# Patient Record
Sex: Female | Born: 1950 | Race: White | Hispanic: No | Marital: Married | Smoking: Former smoker
Health system: Southern US, Community
[De-identification: ages and names within clinical notes are randomized; demographics above are authoritative.]

## PROBLEM LIST (undated history)

## (undated) DIAGNOSIS — D494 Neoplasm of unspecified behavior of bladder: Secondary | ICD-10-CM

## (undated) DIAGNOSIS — Z8673 Personal history of transient ischemic attack (TIA), and cerebral infarction without residual deficits: Secondary | ICD-10-CM

## (undated) DIAGNOSIS — K760 Fatty (change of) liver, not elsewhere classified: Secondary | ICD-10-CM

## (undated) DIAGNOSIS — M1990 Unspecified osteoarthritis, unspecified site: Secondary | ICD-10-CM

## (undated) DIAGNOSIS — N3010 Interstitial cystitis (chronic) without hematuria: Secondary | ICD-10-CM

## (undated) DIAGNOSIS — Z973 Presence of spectacles and contact lenses: Secondary | ICD-10-CM

## (undated) DIAGNOSIS — Z9889 Other specified postprocedural states: Secondary | ICD-10-CM

## (undated) DIAGNOSIS — K219 Gastro-esophageal reflux disease without esophagitis: Secondary | ICD-10-CM

## (undated) DIAGNOSIS — F419 Anxiety disorder, unspecified: Secondary | ICD-10-CM

## (undated) DIAGNOSIS — R079 Chest pain, unspecified: Secondary | ICD-10-CM

## (undated) DIAGNOSIS — K224 Dyskinesia of esophagus: Secondary | ICD-10-CM

## (undated) DIAGNOSIS — R011 Cardiac murmur, unspecified: Secondary | ICD-10-CM

## (undated) DIAGNOSIS — G4700 Insomnia, unspecified: Secondary | ICD-10-CM

## (undated) DIAGNOSIS — I5181 Takotsubo syndrome: Secondary | ICD-10-CM

## (undated) DIAGNOSIS — R112 Nausea with vomiting, unspecified: Secondary | ICD-10-CM

## (undated) DIAGNOSIS — E785 Hyperlipidemia, unspecified: Secondary | ICD-10-CM

## (undated) DIAGNOSIS — G8929 Other chronic pain: Secondary | ICD-10-CM

## (undated) DIAGNOSIS — H269 Unspecified cataract: Secondary | ICD-10-CM

## (undated) DIAGNOSIS — C801 Malignant (primary) neoplasm, unspecified: Secondary | ICD-10-CM

## (undated) DIAGNOSIS — R3989 Other symptoms and signs involving the genitourinary system: Secondary | ICD-10-CM

## (undated) DIAGNOSIS — I2510 Atherosclerotic heart disease of native coronary artery without angina pectoris: Secondary | ICD-10-CM

## (undated) DIAGNOSIS — Z8719 Personal history of other diseases of the digestive system: Secondary | ICD-10-CM

## (undated) DIAGNOSIS — I10 Essential (primary) hypertension: Secondary | ICD-10-CM

## (undated) DIAGNOSIS — K589 Irritable bowel syndrome without diarrhea: Secondary | ICD-10-CM

## (undated) DIAGNOSIS — M26609 Unspecified temporomandibular joint disorder, unspecified side: Secondary | ICD-10-CM

## (undated) DIAGNOSIS — R413 Other amnesia: Secondary | ICD-10-CM

## (undated) HISTORY — PX: CARDIAC CATHETERIZATION: SHX172

## (undated) HISTORY — DX: Anxiety disorder, unspecified: F41.9

## (undated) HISTORY — PX: ABDOMINAL HYSTERECTOMY: SHX81

## (undated) HISTORY — PX: CARPAL TUNNEL RELEASE: SHX101

## (undated) HISTORY — DX: Fatty (change of) liver, not elsewhere classified: K76.0

## (undated) HISTORY — DX: Dyskinesia of esophagus: K22.4

## (undated) HISTORY — DX: Unspecified osteoarthritis, unspecified site: M19.90

## (undated) HISTORY — DX: Takotsubo syndrome: I51.81

## (undated) HISTORY — DX: Interstitial cystitis (chronic) without hematuria: N30.10

## (undated) HISTORY — DX: Atherosclerotic heart disease of native coronary artery without angina pectoris: I25.10

## (undated) HISTORY — DX: Other amnesia: R41.3

## (undated) HISTORY — DX: Unspecified cataract: H26.9

## (undated) HISTORY — DX: Essential (primary) hypertension: I10

## (undated) HISTORY — DX: Hyperlipidemia, unspecified: E78.5

---
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
## 1971-06-11 HISTORY — PX: LUMBAR DISC SURGERY: SHX700

## 1990-06-10 HISTORY — PX: BREAST EXCISIONAL BIOPSY: SUR124

## 1994-06-10 HISTORY — PX: OTHER SURGICAL HISTORY: SHX169

## 1997-09-05 ENCOUNTER — Ambulatory Visit (HOSPITAL_COMMUNITY): Admission: RE | Admit: 1997-09-05 | Discharge: 1997-09-05 | Payer: Self-pay | Admitting: Internal Medicine

## 1997-09-12 ENCOUNTER — Ambulatory Visit (HOSPITAL_COMMUNITY): Admission: RE | Admit: 1997-09-12 | Discharge: 1997-09-12 | Payer: Self-pay | Admitting: Internal Medicine

## 1997-09-29 ENCOUNTER — Other Ambulatory Visit: Admission: RE | Admit: 1997-09-29 | Discharge: 1997-09-29 | Payer: Self-pay | Admitting: Obstetrics and Gynecology

## 1998-09-07 ENCOUNTER — Ambulatory Visit (HOSPITAL_COMMUNITY): Admission: RE | Admit: 1998-09-07 | Discharge: 1998-09-07 | Payer: Self-pay | Admitting: Internal Medicine

## 1998-10-03 ENCOUNTER — Other Ambulatory Visit: Admission: RE | Admit: 1998-10-03 | Discharge: 1998-10-03 | Payer: Self-pay | Admitting: Obstetrics and Gynecology

## 1998-12-15 ENCOUNTER — Encounter: Admission: RE | Admit: 1998-12-15 | Discharge: 1999-01-16 | Payer: Self-pay | Admitting: Internal Medicine

## 1999-09-12 ENCOUNTER — Encounter: Payer: Self-pay | Admitting: Internal Medicine

## 1999-09-12 ENCOUNTER — Ambulatory Visit (HOSPITAL_COMMUNITY): Admission: RE | Admit: 1999-09-12 | Discharge: 1999-09-12 | Payer: Self-pay | Admitting: Internal Medicine

## 1999-10-22 ENCOUNTER — Other Ambulatory Visit: Admission: RE | Admit: 1999-10-22 | Discharge: 1999-10-22 | Payer: Self-pay | Admitting: Obstetrics and Gynecology

## 1999-11-15 ENCOUNTER — Encounter: Admission: RE | Admit: 1999-11-15 | Discharge: 2000-01-04 | Payer: Self-pay | Admitting: Internal Medicine

## 2000-03-21 ENCOUNTER — Encounter: Payer: Self-pay | Admitting: Internal Medicine

## 2000-03-21 ENCOUNTER — Ambulatory Visit (HOSPITAL_COMMUNITY): Admission: RE | Admit: 2000-03-21 | Discharge: 2000-03-21 | Payer: Self-pay | Admitting: Internal Medicine

## 2000-04-08 ENCOUNTER — Encounter: Payer: Self-pay | Admitting: Internal Medicine

## 2000-04-08 ENCOUNTER — Encounter: Admission: RE | Admit: 2000-04-08 | Discharge: 2000-04-08 | Payer: Self-pay | Admitting: Internal Medicine

## 2000-07-01 ENCOUNTER — Encounter: Admission: RE | Admit: 2000-07-01 | Discharge: 2000-07-01 | Payer: Self-pay | Admitting: Internal Medicine

## 2000-07-01 ENCOUNTER — Encounter: Payer: Self-pay | Admitting: Internal Medicine

## 2000-07-11 ENCOUNTER — Encounter: Payer: Self-pay | Admitting: Obstetrics and Gynecology

## 2000-07-11 ENCOUNTER — Encounter: Admission: RE | Admit: 2000-07-11 | Discharge: 2000-07-11 | Payer: Self-pay | Admitting: Obstetrics and Gynecology

## 2000-10-16 ENCOUNTER — Encounter: Payer: Self-pay | Admitting: Internal Medicine

## 2000-10-16 ENCOUNTER — Ambulatory Visit (HOSPITAL_COMMUNITY): Admission: RE | Admit: 2000-10-16 | Discharge: 2000-10-16 | Payer: Self-pay | Admitting: Internal Medicine

## 2001-04-15 ENCOUNTER — Encounter: Payer: Self-pay | Admitting: Physical Medicine and Rehabilitation

## 2001-04-15 ENCOUNTER — Encounter
Admission: RE | Admit: 2001-04-15 | Discharge: 2001-04-15 | Payer: Self-pay | Admitting: Physical Medicine and Rehabilitation

## 2001-10-19 ENCOUNTER — Encounter: Payer: Self-pay | Admitting: Obstetrics and Gynecology

## 2001-10-19 ENCOUNTER — Ambulatory Visit (HOSPITAL_COMMUNITY): Admission: RE | Admit: 2001-10-19 | Discharge: 2001-10-19 | Payer: Self-pay | Admitting: Obstetrics and Gynecology

## 2001-11-06 ENCOUNTER — Encounter: Payer: Self-pay | Admitting: Obstetrics and Gynecology

## 2001-11-06 ENCOUNTER — Encounter: Admission: RE | Admit: 2001-11-06 | Discharge: 2001-11-06 | Payer: Self-pay | Admitting: Obstetrics and Gynecology

## 2002-07-07 ENCOUNTER — Encounter: Payer: Self-pay | Admitting: Internal Medicine

## 2002-07-07 ENCOUNTER — Encounter: Admission: RE | Admit: 2002-07-07 | Discharge: 2002-07-07 | Payer: Self-pay | Admitting: Internal Medicine

## 2002-10-21 ENCOUNTER — Encounter: Payer: Self-pay | Admitting: Internal Medicine

## 2002-10-21 ENCOUNTER — Ambulatory Visit (HOSPITAL_COMMUNITY): Admission: RE | Admit: 2002-10-21 | Discharge: 2002-10-21 | Payer: Self-pay | Admitting: Internal Medicine

## 2002-11-02 ENCOUNTER — Other Ambulatory Visit: Admission: RE | Admit: 2002-11-02 | Discharge: 2002-11-02 | Payer: Self-pay | Admitting: Obstetrics and Gynecology

## 2003-01-07 ENCOUNTER — Encounter: Admission: RE | Admit: 2003-01-07 | Discharge: 2003-01-07 | Payer: Self-pay | Admitting: Internal Medicine

## 2003-01-07 ENCOUNTER — Encounter: Payer: Self-pay | Admitting: Internal Medicine

## 2003-01-17 ENCOUNTER — Encounter: Payer: Self-pay | Admitting: Internal Medicine

## 2003-01-17 ENCOUNTER — Encounter: Admission: RE | Admit: 2003-01-17 | Discharge: 2003-01-17 | Payer: Self-pay | Admitting: Internal Medicine

## 2003-02-09 ENCOUNTER — Encounter: Payer: Self-pay | Admitting: Internal Medicine

## 2003-03-31 ENCOUNTER — Other Ambulatory Visit: Admission: RE | Admit: 2003-03-31 | Discharge: 2003-03-31 | Payer: Self-pay | Admitting: Obstetrics and Gynecology

## 2003-04-04 ENCOUNTER — Encounter (INDEPENDENT_AMBULATORY_CARE_PROVIDER_SITE_OTHER): Payer: Self-pay | Admitting: *Deleted

## 2003-04-04 ENCOUNTER — Other Ambulatory Visit: Admission: RE | Admit: 2003-04-04 | Discharge: 2003-04-04 | Payer: Self-pay | Admitting: Radiology

## 2003-04-04 ENCOUNTER — Encounter: Admission: RE | Admit: 2003-04-04 | Discharge: 2003-04-04 | Payer: Self-pay | Admitting: Obstetrics and Gynecology

## 2003-04-04 ENCOUNTER — Encounter: Payer: Self-pay | Admitting: Obstetrics and Gynecology

## 2003-08-26 ENCOUNTER — Encounter: Admission: RE | Admit: 2003-08-26 | Discharge: 2003-08-26 | Payer: Self-pay | Admitting: Internal Medicine

## 2003-11-09 ENCOUNTER — Other Ambulatory Visit: Admission: RE | Admit: 2003-11-09 | Discharge: 2003-11-09 | Payer: Self-pay | Admitting: Obstetrics and Gynecology

## 2003-12-06 ENCOUNTER — Encounter: Admission: RE | Admit: 2003-12-06 | Discharge: 2003-12-06 | Payer: Self-pay | Admitting: Obstetrics and Gynecology

## 2004-01-11 ENCOUNTER — Encounter: Admission: RE | Admit: 2004-01-11 | Discharge: 2004-01-11 | Payer: Self-pay | Admitting: Internal Medicine

## 2004-01-17 ENCOUNTER — Encounter: Admission: RE | Admit: 2004-01-17 | Discharge: 2004-01-17 | Payer: Self-pay | Admitting: Internal Medicine

## 2004-05-12 ENCOUNTER — Emergency Department (HOSPITAL_COMMUNITY): Admission: EM | Admit: 2004-05-12 | Discharge: 2004-05-12 | Payer: Self-pay | Admitting: Emergency Medicine

## 2004-05-21 ENCOUNTER — Ambulatory Visit (HOSPITAL_COMMUNITY): Admission: RE | Admit: 2004-05-21 | Discharge: 2004-05-21 | Payer: Self-pay | Admitting: Internal Medicine

## 2004-11-09 ENCOUNTER — Other Ambulatory Visit: Admission: RE | Admit: 2004-11-09 | Discharge: 2004-11-27 | Payer: Self-pay | Admitting: Addiction Medicine

## 2004-12-06 ENCOUNTER — Ambulatory Visit (HOSPITAL_COMMUNITY): Admission: RE | Admit: 2004-12-06 | Discharge: 2004-12-06 | Payer: Self-pay | Admitting: Internal Medicine

## 2004-12-21 ENCOUNTER — Ambulatory Visit: Payer: Self-pay | Admitting: Internal Medicine

## 2005-04-17 ENCOUNTER — Encounter: Admission: RE | Admit: 2005-04-17 | Discharge: 2005-04-17 | Payer: Self-pay | Admitting: Internal Medicine

## 2005-04-29 ENCOUNTER — Ambulatory Visit: Payer: Self-pay | Admitting: Internal Medicine

## 2005-05-13 ENCOUNTER — Ambulatory Visit: Payer: Self-pay | Admitting: Internal Medicine

## 2005-07-11 ENCOUNTER — Ambulatory Visit: Payer: Self-pay | Admitting: Internal Medicine

## 2005-07-16 ENCOUNTER — Ambulatory Visit: Payer: Self-pay | Admitting: Cardiology

## 2005-10-15 ENCOUNTER — Emergency Department (HOSPITAL_COMMUNITY): Admission: EM | Admit: 2005-10-15 | Discharge: 2005-10-16 | Payer: Self-pay | Admitting: Emergency Medicine

## 2005-11-26 ENCOUNTER — Other Ambulatory Visit: Admission: RE | Admit: 2005-11-26 | Discharge: 2005-11-26 | Payer: Self-pay | Admitting: Obstetrics and Gynecology

## 2005-12-10 ENCOUNTER — Ambulatory Visit (HOSPITAL_COMMUNITY): Admission: RE | Admit: 2005-12-10 | Discharge: 2005-12-10 | Payer: Self-pay | Admitting: Internal Medicine

## 2006-05-07 ENCOUNTER — Encounter: Admission: RE | Admit: 2006-05-07 | Discharge: 2006-05-07 | Payer: Self-pay | Admitting: Internal Medicine

## 2006-10-24 ENCOUNTER — Encounter: Admission: RE | Admit: 2006-10-24 | Discharge: 2006-10-24 | Payer: Self-pay | Admitting: Internal Medicine

## 2006-10-31 ENCOUNTER — Encounter: Admission: RE | Admit: 2006-10-31 | Discharge: 2006-10-31 | Payer: Self-pay | Admitting: Internal Medicine

## 2006-11-20 ENCOUNTER — Ambulatory Visit (HOSPITAL_COMMUNITY): Admission: RE | Admit: 2006-11-20 | Discharge: 2006-11-20 | Payer: Self-pay | Admitting: Interventional Cardiology

## 2006-11-28 ENCOUNTER — Inpatient Hospital Stay (HOSPITAL_BASED_OUTPATIENT_CLINIC_OR_DEPARTMENT_OTHER): Admission: RE | Admit: 2006-11-28 | Discharge: 2006-11-28 | Payer: Self-pay | Admitting: Interventional Cardiology

## 2006-12-19 ENCOUNTER — Ambulatory Visit (HOSPITAL_COMMUNITY): Admission: RE | Admit: 2006-12-19 | Discharge: 2006-12-19 | Payer: Self-pay | Admitting: Internal Medicine

## 2007-01-22 ENCOUNTER — Other Ambulatory Visit: Admission: RE | Admit: 2007-01-22 | Discharge: 2007-01-22 | Payer: Self-pay | Admitting: Internal Medicine

## 2007-03-12 ENCOUNTER — Ambulatory Visit: Payer: Self-pay | Admitting: Internal Medicine

## 2007-03-23 ENCOUNTER — Encounter: Payer: Self-pay | Admitting: Internal Medicine

## 2007-03-23 ENCOUNTER — Ambulatory Visit (HOSPITAL_COMMUNITY): Admission: RE | Admit: 2007-03-23 | Discharge: 2007-03-23 | Payer: Self-pay | Admitting: Internal Medicine

## 2007-03-31 ENCOUNTER — Ambulatory Visit: Payer: Self-pay | Admitting: Internal Medicine

## 2007-04-24 ENCOUNTER — Ambulatory Visit: Payer: Self-pay | Admitting: Internal Medicine

## 2007-08-05 DIAGNOSIS — I25119 Atherosclerotic heart disease of native coronary artery with unspecified angina pectoris: Secondary | ICD-10-CM | POA: Insufficient documentation

## 2007-08-05 DIAGNOSIS — R1013 Epigastric pain: Secondary | ICD-10-CM

## 2007-08-05 DIAGNOSIS — E785 Hyperlipidemia, unspecified: Secondary | ICD-10-CM | POA: Insufficient documentation

## 2007-08-05 DIAGNOSIS — K3189 Other diseases of stomach and duodenum: Secondary | ICD-10-CM | POA: Insufficient documentation

## 2007-08-05 DIAGNOSIS — K219 Gastro-esophageal reflux disease without esophagitis: Secondary | ICD-10-CM | POA: Insufficient documentation

## 2007-08-05 DIAGNOSIS — K594 Anal spasm: Secondary | ICD-10-CM | POA: Insufficient documentation

## 2007-08-05 DIAGNOSIS — K589 Irritable bowel syndrome without diarrhea: Secondary | ICD-10-CM | POA: Insufficient documentation

## 2007-08-05 DIAGNOSIS — I10 Essential (primary) hypertension: Secondary | ICD-10-CM | POA: Insufficient documentation

## 2007-10-08 ENCOUNTER — Encounter (HOSPITAL_COMMUNITY): Admission: RE | Admit: 2007-10-08 | Discharge: 2007-12-09 | Payer: Self-pay | Admitting: Interventional Cardiology

## 2007-11-01 ENCOUNTER — Ambulatory Visit: Payer: Self-pay | Admitting: Internal Medicine

## 2007-11-02 ENCOUNTER — Observation Stay (HOSPITAL_COMMUNITY): Admission: EM | Admit: 2007-11-02 | Discharge: 2007-11-02 | Payer: Self-pay | Admitting: Emergency Medicine

## 2007-12-24 ENCOUNTER — Ambulatory Visit (HOSPITAL_COMMUNITY): Admission: RE | Admit: 2007-12-24 | Discharge: 2007-12-24 | Payer: Self-pay | Admitting: Internal Medicine

## 2008-03-28 ENCOUNTER — Telehealth: Payer: Self-pay | Admitting: Internal Medicine

## 2008-06-10 HISTORY — PX: SHOULDER SURGERY: SHX246

## 2008-06-10 HISTORY — PX: OTHER SURGICAL HISTORY: SHX169

## 2008-06-13 ENCOUNTER — Telehealth: Payer: Self-pay | Admitting: Internal Medicine

## 2008-06-28 ENCOUNTER — Encounter: Payer: Self-pay | Admitting: Internal Medicine

## 2008-06-29 ENCOUNTER — Telehealth: Payer: Self-pay | Admitting: Internal Medicine

## 2008-08-15 ENCOUNTER — Telehealth: Payer: Self-pay | Admitting: Internal Medicine

## 2008-09-16 ENCOUNTER — Telehealth: Payer: Self-pay | Admitting: Internal Medicine

## 2008-09-19 ENCOUNTER — Ambulatory Visit: Payer: Self-pay | Admitting: Internal Medicine

## 2008-11-03 ENCOUNTER — Telehealth: Payer: Self-pay | Admitting: Internal Medicine

## 2008-11-03 ENCOUNTER — Encounter: Payer: Self-pay | Admitting: Internal Medicine

## 2008-12-21 ENCOUNTER — Telehealth: Payer: Self-pay | Admitting: Internal Medicine

## 2008-12-30 ENCOUNTER — Ambulatory Visit (HOSPITAL_COMMUNITY): Admission: RE | Admit: 2008-12-30 | Discharge: 2008-12-30 | Payer: Self-pay | Admitting: Internal Medicine

## 2009-01-02 ENCOUNTER — Telehealth: Payer: Self-pay | Admitting: Internal Medicine

## 2009-01-13 ENCOUNTER — Telehealth: Payer: Self-pay | Admitting: Internal Medicine

## 2009-07-06 ENCOUNTER — Other Ambulatory Visit: Admission: RE | Admit: 2009-07-06 | Discharge: 2009-07-06 | Payer: Self-pay | Admitting: Internal Medicine

## 2009-08-11 ENCOUNTER — Encounter: Admission: RE | Admit: 2009-08-11 | Discharge: 2009-08-11 | Payer: Self-pay | Admitting: Specialist

## 2009-09-29 ENCOUNTER — Observation Stay (HOSPITAL_COMMUNITY): Admission: EM | Admit: 2009-09-29 | Discharge: 2009-09-30 | Payer: Self-pay | Admitting: Emergency Medicine

## 2009-09-30 ENCOUNTER — Encounter (INDEPENDENT_AMBULATORY_CARE_PROVIDER_SITE_OTHER): Payer: Self-pay | Admitting: Pediatrics

## 2010-01-17 ENCOUNTER — Ambulatory Visit: Payer: Self-pay | Admitting: Internal Medicine

## 2010-01-17 DIAGNOSIS — R350 Frequency of micturition: Secondary | ICD-10-CM | POA: Insufficient documentation

## 2010-01-17 DIAGNOSIS — I6350 Cerebral infarction due to unspecified occlusion or stenosis of unspecified cerebral artery: Secondary | ICD-10-CM | POA: Insufficient documentation

## 2010-01-19 ENCOUNTER — Ambulatory Visit (HOSPITAL_COMMUNITY): Admission: RE | Admit: 2010-01-19 | Discharge: 2010-01-19 | Payer: Self-pay | Admitting: Internal Medicine

## 2010-02-13 ENCOUNTER — Encounter: Payer: Self-pay | Admitting: Internal Medicine

## 2010-03-12 ENCOUNTER — Ambulatory Visit (HOSPITAL_BASED_OUTPATIENT_CLINIC_OR_DEPARTMENT_OTHER): Admission: RE | Admit: 2010-03-12 | Discharge: 2010-03-12 | Payer: Self-pay | Admitting: Urology

## 2010-03-12 HISTORY — PX: OTHER SURGICAL HISTORY: SHX169

## 2010-03-16 ENCOUNTER — Encounter: Payer: Self-pay | Admitting: Internal Medicine

## 2010-07-10 NOTE — Letter (Signed)
[facility]: [facility]   Imported By: Sherian Rein 04/02/2010 15:39:55  _____________________________________________________________________  External Attachment:    Type:   Image     Comment:   External Document

## 2010-07-10 NOTE — Procedures (Signed)
Summary: Gastroenterology EGD  Gastroenterology EGD   Imported By: Lowry Ram CMA 08/05/2007 17:03:32  _____________________________________________________________________  External Attachment:    Type:   Image     Comment:   External Document

## 2010-07-10 NOTE — Medication Information (Signed)
Summary: Approved for Pantoprazole/[facility]  Approved for Pantoprazole/[facility]   Imported By: Sherian Rein 11/10/2008 08:16:24  _____________________________________________________________________  External Attachment:    Type:   Image     Comment:   External Document

## 2010-07-10 NOTE — Progress Notes (Signed)
Summary: no prior autho  Phone Note Call from Patient   Caller: Patient Call For: Ramon Brant  Reason for Call: Talk to Nurse Summary of Call: FYI.Marland KitchenMarland Kitchenper [facility] needs prior autho on Dexilant (was just advised) ..but pt wanted to let the office know that she does NOT want this med so Do NOT send the prior autho  Initial call taken by: Guadlupe [employer],  January 13, 2009 3:15 PM  Follow-up for Phone Call        noted Follow-up by: Harlow Mares CMA Duncan Dull),  January 13, 2009 3:47 PM

## 2010-07-10 NOTE — Assessment & Plan Note (Signed)
Summary: RECTAL PAIN...AS.   History of Present Illness Visit Type: Follow-up Visit Primary GI MD: Lina Sar MD Primary Provider: Renford Dills, MD Chief Complaint: rectal discomfort off and on x 1 month with back pain. Pt denies change in bowel habits.  History of Present Illness:   Ms. [name] is a 60 year old white female with a history of irritable bowel syndrome, proctalgia,  rectal spasm, and now with suspected esophageal spasm and gastroesophageal reflux disease. She had a cardiac evaluation for chest pain in June 2008, which showed significant coronary artery disease, but her current chest pain does not seem to be interpreted as cardiac. Her last upper endoscopy in September 2004 showed a normal exam. She had an esophageal manometry which was entirely normal and she had a 24 hour pH study, which apparently was normal as well. The patient's chest pain occurred while taking Nexium. Librax seemed to have helped with the pain. Her HIDA scan with CCK showed a normal ejection fraction of 55%. She has been on isosorbide 30 mg daily for esophageal spasm with some improvement of her symptoms.  She is seen today because of increasing rectal pain. Her pain is different than her usual rectal spasm. This is more of a constant ache which occurs during the day but not at night. There has been no rectal bleeding. Her last colonoscopy was in 2006.   GI Review of Systems      Denies abdominal pain, acid reflux, belching, bloating, chest pain, dysphagia with liquids, dysphagia with solids, heartburn, loss of appetite, nausea, vomiting, vomiting blood, weight loss, and  weight gain.      Reports rectal pain.     Denies anal fissure, black tarry stools, change in bowel habit, constipation, diarrhea, diverticulosis, fecal incontinence, heme positive stool, hemorrhoids, irritable bowel syndrome, jaundice, light color stool, liver problems, and  rectal bleeding. Preventive Screening-Counseling & Management     Smoking Status: never      Drug Use:  no.      Current Medications (verified): 1)  Protonix 40 Mg Tbec (Pantoprazole Sodium) .... Take 1 Tablet By Mouth Two Times A Day. Must Have Office Visits For Any Further Refills! 2)  Isosorbide Dinitrate 30 Mg Tabs (Isosorbide Dinitrate) .... One Tablet By Mouth Once Daily 3)  Acyclovir 400 Mg Tabs (Acyclovir) .... One Tablet By Mouth Two Times A Day 4)  Climara 0.075 Mg/24hr Ptwk (Estradiol) .... Once A Week 5)  Aspirin 81 Mg  Tabs (Aspirin) .... One Tablet By Mouth Once Daily 6)  Vitamin D 2000 Unit Tabs (Cholecalciferol) .... One Tablet By Mouth Once Daily 7)  Multivitamins   Tabs (Multiple Vitamin) .... One Tablet By Mouth Once Daily 8)  Dicyclomine Hcl 10 Mg Caps (Dicyclomine Hcl) .... One Tablet By Mouth Three Times A Day 9)  Librax 2.5-5 Mg Caps (Clidinium-Chlordiazepoxide) .... One Tablet By Mouth As Needed 10)  Nitroglycerin Cr 2.5 Mg Cr-Caps (Nitroglycerin) .... As Needed 11)  Diltiazem 2% Gel .... As Needed  Allergies (verified): 1)  ! * Levsinex 2)  ! 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     Alcohol Use - yes    Illicit Drug Use - no    Smoking Status:  never    Drug Use:  no  Review of Systems  The patient denies allergy/sinus, anemia, anxiety-new, arthritis/joint pain, back pain, blood in urine,  breast changes/lumps, change in vision, confusion, cough, coughing up blood, depression-new, fainting, fatigue, fever, headaches-new, hearing problems, heart murmur, heart rhythm changes, itching, menstrual pain, muscle pains/cramps, night sweats, nosebleeds, pregnancy symptoms, shortness of breath, skin rash, sleeping problems, sore throat, swelling of feet/legs, swollen lymph glands, thirst - excessive , urination - excessive , urination changes/pain, urine leakage, vision changes, and voice change.    Physical Exam  General:  Well developed, well nourished, no acute distress. Neck:  Supple; no masses or thyromegaly. Lungs:  Clear throughout to auscultation. Heart:  Regular rate and rhythm; no murmurs, rubs or bruits. Abdomen:  soft abdomen with normal active bowel sounds, minimal tenderness in left lower quadrant, no palpable mass and no distention. Rectal:  Anoscopic exam shows a normal perianal area. Normal rectal tone. First grade hemorrhoids are present but not active. There is no pain within the anal canal but there is some tenderness of the rectal ampulla. Mucosa of the rectal ampulla appears normal. There is also some discomfort or pressure at the coccygeal area. Extremities:  No clubbing, cyanosis, edema or deformities noted.   Impression & Recommendations:  Problem # 1:  Hx of PROCTALGIA FUGAX (ICD-564.6) history of rectal spasm. The patient is now having a different rectal discomfort. She has small first grade hemorrhoids. He will go ahead and treat them with Anusol-HC suppositories. At the same time, I have asked her to continue on the Librax one capsule twice a day.  Problem # 2:  IRRITABLE BOWEL SYNDROME (ICD-564.1) alternating diarrhea and constipation consistent with irritable bowel syndrome. We will start her on Klonopin 1 mg at bedtime.  Problem # 3:  FAMILY HISTORY COLONIC POLYPS (ICD-V18.51) last colonoscopy was in December 2006. It was a normal exam. Her next colonoscopy  will be due in December 2016.  Patient Instructions: 1)  Klonopin 1 mg daily 2)  Anusol-HC suppositories q.h.s. 3)  refill Diltiazem 2% gel as needed for rectal spasm 4)  Refill on pantoprazole 40 mg p.o. b.i.d. 5)  Librax one p.o. b.i.d. p.r.n. 6)  Recall colonoscopy December 2016 7)  Copy sent to : Dr R.Polite Prescriptions: KLONOPIN 1 MG TABS (CLONAZEPAM) Take 1 tablet by mouth at bedtime  #90 x 3   Entered by:   Hortense Ramal CMA   Authorized by:   Hart Carwin   Signed by:   Hortense Ramal CMA on 09/19/2008   Method used:   Printed then faxed to ...       [facility]* (mail-order)             ,          Ph: [phone]       Fax: [phone]   RxID:   [id number] ANUSOL-HC 25 MG SUPP (HYDROCORTISONE ACETATE) Insert 1 suppository into rectum at bedtime  #90 x 3   Entered by:   Hortense Ramal CMA   Authorized by:  Hart Carwin   Signed by:   Hortense Ramal CMA on 09/19/2008   Method used:   Electronically to        [facility]* (mail-order)             ,          Ph: [phone]       Fax: [phone]   RxID:   [id number] DILTIAZEM 2% GEL Apply as directed  #30 grams x 1   Entered by:   Hortense Ramal CMA   Authorized by:   Hart Carwin   Signed by:   Hortense Ramal CMA on 09/19/2008   Method used:   Faxed to ...       [facility]* (retail)       [address] [place], [place]  [address]       Ph: [phone]       Fax: [phone]   RxID:   [id number]

## 2010-07-10 NOTE — Assessment & Plan Note (Signed)
Summary: f/u--ch.   History of Present Illness Visit Type: Follow-up Visit Primary GI MD: Lina Sar MD Primary Provider: Renford Dills, MD Chief Complaint: Patient having rectal spasms and having some lower abdominal for about a week. Patient needs refills on her rectal meds, Diltiazem and Anusol Supp.  History of Present Illness:   This is a 60 year old white female with irritable bowel syndrome and hx of  rectal spasm causing  proctalgia. I have been following her for over 20 years now and the rectal spasm has been relieved with Diltiazem  2% cream She needs a refill. She has urinary frequency, urinating 3 times an hour. She has normal bowel habits, sometimes constipated. Her last colonoscopy was in December 2006. There is a family history of colon polyps in her mother. Her next colonoscopy will be due in 2016. She has a history of esophageal spasm and noncardiac chest pain. She has a history of coronary artery disease as well. An esophageal manometry and 24-hour pH probe in the past were negative. An upper endoscopy in September 2004 was normal.   GI Review of Systems    Reports abdominal pain.     Location of  Abdominal pain: lower abdomen.    Denies acid reflux, belching, bloating, chest pain, dysphagia with liquids, dysphagia with solids, heartburn, loss of appetite, nausea, vomiting, vomiting blood, weight loss, and  weight gain.        Denies anal fissure, black tarry stools, change in bowel habit, constipation, diarrhea, diverticulosis, fecal incontinence, heme positive stool, hemorrhoids, irritable bowel syndrome, jaundice, light color stool, liver problems, rectal bleeding, and  rectal pain.    Current Medications (verified): 1)  Nexium 40 Mg Cpdr (Esomeprazole Magnesium) .... Take One By Mouth Once Daily 2)  Isosorbide Dinitrate 30 Mg Tabs (Isosorbide Dinitrate) .... One Tablet By Mouth Once Daily 3)  Acyclovir 400 Mg Tabs (Acyclovir) .... One Tablet By Mouth Two Times A Day 4)   Climara 0.05 Mg/24hr Ptwk (Estradiol) .... Once A Week 5)  Aspirin 81 Mg  Tabs (Aspirin) .... Two Tablets By Mouth Once Daily 6)  Vitamin D 2000 Unit Tabs (Cholecalciferol) .... One Tablet By Mouth Once Daily 7)  Multivitamins   Tabs (Multiple Vitamin) .... One Tablet By Mouth Once Daily 8)  Dicyclomine Hcl 10 Mg Caps (Dicyclomine Hcl) .... One Tablet By Mouth As Needed 9)  Librax 2.5-5 Mg Caps (Clidinium-Chlordiazepoxide) .... One Tablet By Mouth As Needed 10)  Nitroglycerin Cr 2.5 Mg Cr-Caps (Nitroglycerin) .... As Needed 11)  Diltiazem 2% Gel .... Apply As Directed 12)  Anusol-Hc 25 Mg Supp (Hydrocortisone Acetate) .... Insert 1 Suppository Into Rectum At Bedtime 13)  Celebrex 200 Mg Caps (Celecoxib) .... Take One By Mouth Once Daily As Needed  Allergies (verified): 1)  ! * Levsinex 2)  ! Hyoscyamine  Past History:  Past Medical History: Current Problems:  CVA (ICD-434.91) FAMILY HISTORY COLONIC POLYPS (ICD-V18.51) DYSPEPSIA (ICD-536.8) CORONARY ARTERY DISEASE (ICD-414.00) Hx of PROCTALGIA FUGAX (ICD-564.6) IRRITABLE BOWEL SYNDROME (ICD-564.1) HYPERLIPIDEMIA (ICD-272.4) GERD (ICD-530.81) HYPERTENSION (ICD-401.9)  Past Surgical History: Reviewed history from 01/04/2010 and no changes required. Hysterectomy 1989 Appendectomy 1971 Lumpectomy of breast 1996 Ovarian Cyst removed 1970 Lumbar Laminectomy 1973  Family History: Family History of Heart Disease: Father, Mother, Brother Family History of Colon Polyps: mother Father-diverticulosis No FH of Colon Cancer:  Social History: Reviewed history from 01/04/2010 and no changes required. Married Patient has never smoked.  Alcohol Use - yes-occasional wine Illicit Drug Use - no  Review of Systems       The patient complains of allergy/sinus, anxiety-new, arthritis/joint pain, back pain, fatigue, night sweats, sleeping problems, and urination - excessive.  The patient denies anemia, blood in urine, breast changes/lumps,  change in vision, confusion, cough, coughing up blood, depression-new, fainting, fever, headaches-new, hearing problems, heart murmur, heart rhythm changes, itching, menstrual pain, muscle pains/cramps, nosebleeds, pregnancy symptoms, shortness of breath, skin rash, sore throat, swelling of feet/legs, swollen lymph glands, thirst - excessive , urination - excessive , urination changes/pain, urine leakage, vision changes, and voice change.         Pertinent positive and negative review of systems were noted in the above HPI. All other ROS was otherwise negative.   Vital Signs:  Patient profile:   60 year old female Height:      62.5 inches Weight:      126.8 pounds BMI:     22.90 Pulse rate:   68 / minute Pulse rhythm:   regular BP sitting:   98 / 58  (left arm) Cuff size:   regular  Vitals Entered By: Harlow Mares CMA Duncan Dull) (January 17, 2010 2:00 PM)  Physical Exam  General:  Well developed, well nourished, no acute distress. Eyes:  PERRLA, no icterus. Mouth:  No deformity or lesions, dentition normal. Abdomen:  Soft, nontender and nondistended. No masses, hepatosplenomegaly or hernias noted. Normal bowel sounds. Rectal:  large external skin tags most likely inactive hemorrhoids. Normal rectal sphincter tone. No tenderness. Rectal ampulla is normal, no prolapse of the tissue. Stool is Hemoccult negative. Extremities:  No clubbing, cyanosis, edema or deformities noted. Skin:  Intact without significant lesions or rashes. Psych:  Alert and cooperative. Normal mood and affect.   Impression & Recommendations:  Problem # 1:  FAMILY HISTORY COLONIC POLYPS (ICD-V18.51) A recall colonoscopy will be due in December 2016.  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 She is to continue to take Librax p.r.n. IBS. We will refer her to urology to rule out interstitial cystitis versus bladder spasms. I have also suggested bowel training and evaluation of rectal  spasms at the urology clinic.  [facility]  Patient Instructions: 1)  refill Diltiazem 2% cream.  2)  referral to urology clinic to rule out interstitial cystitis.  3)  Take Librax for irritable bowel syndrome. 4)  Recall colonoscopy December 2016. 5)  Copy sent to : Dr Page Spiro, Dr Trula Slade 6)  The medication list was reviewed and reconciled.  All changed / newly prescribed medications were explained.  A complete medication list was provided to the patient / caregiver. Prescriptions: DILTIAZEM 2% GEL Apply as directed  #30 grams x 4   Entered by:   Lamona Curl CMA (AAMA)   Authorized by:   Hart Carwin MD   Signed by:   Lamona Curl CMA (AAMA) on 01/17/2010   Method used:   Faxed to ...       [facility]* (retail)       [address]       [place], [place]  [address]       Ph: [phone]       Fax: [phone]   RxID:   [id number]

## 2010-07-10 NOTE — Procedures (Signed)
Summary: [facility]   [facility]astro[facility]   Imported By: Lowry Ram CMA 08/05/2007 17:02:10  _____________________________________________________________________  External Attachment:    Type:   Image     Comment:   External Document

## 2010-07-10 NOTE — Letter (Signed)
[facility]: [facility]   Imported By: Lester [place] 02/20/2010 09:12:35  _____________________________________________________________________  External Attachment:    Type:   Image     Comment:   External Document

## 2010-07-10 NOTE — Progress Notes (Signed)
Summary: [facility] request error  Phone Note Call from Patient Call back at [phone]   Caller: Patient Call For: Juanda Chance Reason for Call: Talk to Nurse Details for Reason: [facility] request error Summary of Call: error in [facility] rx request... please call pt Initial call taken by: Vallarie Mare,  June 29, 2008 9:24 AM  Follow-up for Phone Call        Patient states that she was given protonix only once daily.  I have advised patient that when switching to a new med (pt changed from nexium to protonix) that we often try to start out at once daily.  Patient says that one daily is not working.  Patient can increased to two times a day protonix and will call before she runs out to get samples until she can have another protonix rx filled. Follow-up by: Hortense Ramal CMA,  June 29, 2008 9:38 AM

## 2010-07-10 NOTE — Progress Notes (Signed)
Summary: Discuss her meds  Phone Note Call from Patient Call back at Home Phone [phone]   Call For: DR Tajuana Kniskern Reason for Call: Talk to Nurse Summary of Call: Discuss her meds with you again. Initial call taken by: Leanor Kail [facility],  January 02, 2009 10:55 AM  Follow-up for Phone Call        rx sent, kapidex worked great.  Follow-up by: Harlow Mares CMA (AAMA),  January 02, 2009 11:00 AM    New/Updated Medications: KAPIDEX 60 MG CPDR (DEXLANSOPRAZOLE) take one by mouth once daily Prescriptions: KAPIDEX 60 MG CPDR (DEXLANSOPRAZOLE) take one by mouth once daily  #90 x 3   Entered by:   Harlow Mares CMA (AAMA)   Authorized by:   Hart Carwin MD   Signed by:   Harlow Mares CMA (AAMA) on 01/02/2009   Method used:   Electronically to        [facility]* (mail-order)             ,          Ph: [phone]       Fax: [phone]   RxID:   [id number]

## 2010-07-10 NOTE — Procedures (Signed)
Summary: [facility]  [facility]   Imported By: Lowry Ram CMA 08/05/2007 17:05:02  _____________________________________________________________________  External Attachment:    Type:   Image     Comment:   External Document

## 2010-07-10 NOTE — Progress Notes (Signed)
Summary: Nexium samples  Phone Note Call from Patient   Caller: Patient Call For: Tatsuya Okray Reason for Call: Refill Medication Details for Reason: NEXIUM Summary of Call: pt has been receiving Nexium samples from primary doctor, but primary doctor has been out of supply for a while and pt is completely out... wants to know if she can come pick some up from [place]  [phone]  home Initial call taken by: Vallarie Mare,  June 13, 2008 2:33 PM  Follow-up for Phone Call        I have put samples at front desk for patient pick up.  Patient states that she would like a cheaper med but nexium is the one that has seemed to work for her so far.  I have advised patient that she should call her insurance company to find out their preferred PPI then call us back with that info.  I have advised patient that once we get that information, I will ask Dr Juanda Chance if she can change medications or if she should remain on Nexium. Follow-up by: Hortense Ramal CMA,  June 13, 2008 3:02 PM      Appended Document: Nexium samples Dr Juanda Chance-  Patient has called her insurance company and she says that they prefer she take either omeprazole or protonix.  She says she has taken omeprazole in the past and it did not help. Patient does not recall taking protonix in the past. Would you be agreeable to changing patient to pantoprazole? Patient for some reason thinks you only wanted her to take nexium.....  Appended Document: Nexium samples It is OK to switch to Protonix 40mg ,  # 30, 1 by mouth qg,  6 refills  Appended Document: Nexium samples Patient advised we will send protonix rx to [facility] for her.   Clinical Lists Changes  Medications: Added new medication of PROTONIX 40 MG TBEC (PANTOPRAZOLE SODIUM) Take 1 tablet by mouth once a day - Signed Rx of PROTONIX 40 MG TBEC (PANTOPRAZOLE SODIUM) Take 1 tablet by mouth once a day;  #90 x 0;  Signed;  Entered by: Hortense Ramal CMA;  Authorized by: Hart Carwin MD;   Method used: Printed then faxed to [facility]*, , ,   , Ph: [phone], Fax: [phone]    Prescriptions: PROTONIX 40 MG TBEC (PANTOPRAZOLE SODIUM) Take 1 tablet by mouth once a day  #90 x 0   Entered by:   Hortense Ramal CMA   Authorized by:   Hart Carwin MD   Signed by:   Hortense Ramal CMA on 06/15/2008   Method used:   Printed then faxed to ...       [facility]* (mail-order)             ,          Ph: [phone]       Fax: [phone]   RxID:   [id number]

## 2010-08-23 LAB — POCT I-STAT 4, (NA,K, GLUC, HGB,HCT)
Glucose, Bld: 94 mg/dL (ref 70–99)
HCT: 41 % (ref 36.0–46.0)
Hemoglobin: 13.9 g/dL (ref 12.0–15.0)
Potassium: 4.2 mEq/L (ref 3.5–5.1)
Sodium: 143 mEq/L (ref 135–145)

## 2010-08-28 LAB — COMPREHENSIVE METABOLIC PANEL
ALT: 17 U/L (ref 0–35)
ALT: 21 U/L (ref 0–35)
AST: 25 U/L (ref 0–37)
AST: 26 U/L (ref 0–37)
Albumin: 3.6 g/dL (ref 3.5–5.2)
Albumin: 4.4 g/dL (ref 3.5–5.2)
Alkaline Phosphatase: 63 U/L (ref 39–117)
Alkaline Phosphatase: 74 U/L (ref 39–117)
BUN: 7 mg/dL (ref 6–23)
BUN: 8 mg/dL (ref 6–23)
CO2: 26 mEq/L (ref 19–32)
CO2: 29 mEq/L (ref 19–32)
Calcium: 8.8 mg/dL (ref 8.4–10.5)
Calcium: 9.4 mg/dL (ref 8.4–10.5)
Chloride: 105 mEq/L (ref 96–112)
Chloride: 105 mEq/L (ref 96–112)
Creatinine, Ser: 0.61 mg/dL (ref 0.4–1.2)
Creatinine, Ser: 0.66 mg/dL (ref 0.4–1.2)
GFR calc Af Amer: >60 mL/min (ref >60)
GFR calc Af Amer: >60 mL/min (ref >60)
GFR calc non Af Amer: >60 mL/min (ref >60)
GFR calc non Af Amer: >60 mL/min (ref >60)
Glucose, Bld: 108 mg/dL — ABNORMAL HIGH (ref 70–99)
Glucose, Bld: 99 mg/dL (ref 70–99)
Potassium: 3.3 mEq/L — ABNORMAL LOW (ref 3.5–5.1)
Potassium: 3.4 mEq/L — ABNORMAL LOW (ref 3.5–5.1)
Sodium: 139 mEq/L (ref 135–145)
Sodium: 141 mEq/L (ref 135–145)
Total Bilirubin: 0.4 mg/dL (ref 0.3–1.2)
Total Bilirubin: 0.5 mg/dL (ref 0.3–1.2)
Total Protein: 6.3 g/dL (ref 6.0–8.3)
Total Protein: 7.5 g/dL (ref 6.0–8.3)

## 2010-08-28 LAB — URINALYSIS, ROUTINE W REFLEX MICROSCOPIC
Bilirubin Urine: NEGATIVE
Glucose, UA: NEGATIVE mg/dL
Hgb urine dipstick: NEGATIVE
Ketones, ur: NEGATIVE mg/dL
Nitrite: NEGATIVE
Protein, ur: NEGATIVE mg/dL
Specific Gravity, Urine: 1.009 (ref 1.005–1.030)
Urobilinogen, UA: 0.2 mg/dL (ref 0.0–1.0)
pH: 8 (ref 5.0–8.0)

## 2010-08-28 LAB — PROTIME-INR
INR: 0.93 (ref 0.00–1.49)
INR: 1 (ref 0.00–1.49)
Prothrombin Time: 12.4 seconds (ref 11.6–15.2)
Prothrombin Time: 13.1 seconds (ref 11.6–15.2)

## 2010-08-28 LAB — CBC
HCT: 34.4 % — ABNORMAL LOW (ref 36.0–46.0)
HCT: 38.3 % (ref 36.0–46.0)
Hemoglobin: 12.1 g/dL (ref 12.0–15.0)
Hemoglobin: 13.3 g/dL (ref 12.0–15.0)
MCHC: 34.8 g/dL (ref 30.0–36.0)
MCHC: 35.1 g/dL (ref 30.0–36.0)
MCV: 89.8 fL (ref 78.0–100.0)
MCV: 89.8 fL (ref 78.0–100.0)
Platelets: 181 10*3/uL (ref 150–400)
Platelets: 211 10*3/uL (ref 150–400)
RBC: 3.83 MIL/uL — ABNORMAL LOW (ref 3.87–5.11)
RBC: 4.27 MIL/uL (ref 3.87–5.11)
RDW: 12.8 % (ref 11.5–15.5)
RDW: 13.2 % (ref 11.5–15.5)
WBC: 4.9 10*3/uL (ref 4.0–10.5)
WBC: 5.8 10*3/uL (ref 4.0–10.5)

## 2010-08-28 LAB — POCT I-STAT, CHEM 8
BUN: 9 mg/dL (ref 6–23)
Calcium, Ion: 1.12 mmol/L (ref 1.12–1.32)
Chloride: 105 mEq/L (ref 96–112)
Creatinine, Ser: 0.7 mg/dL (ref 0.4–1.2)
Glucose, Bld: 101 mg/dL — ABNORMAL HIGH (ref 70–99)
HCT: 41 % (ref 36.0–46.0)
Hemoglobin: 13.9 g/dL (ref 12.0–15.0)
Potassium: 3.4 mEq/L — ABNORMAL LOW (ref 3.5–5.1)
Sodium: 141 mEq/L (ref 135–145)
TCO2: 25 mmol/L (ref 0–100)

## 2010-08-28 LAB — DIFFERENTIAL
Basophils Absolute: 0 10*3/uL (ref 0.0–0.1)
Basophils Relative: 1 % (ref 0–1)
Eosinophils Absolute: 0.1 10*3/uL (ref 0.0–0.7)
Eosinophils Relative: 2 % (ref 0–5)
Lymphocytes Relative: 24 % (ref 12–46)
Lymphs Abs: 1.4 10*3/uL (ref 0.7–4.0)
Monocytes Absolute: 0.6 10*3/uL (ref 0.1–1.0)
Monocytes Relative: 11 % (ref 3–12)
Neutro Abs: 3.7 10*3/uL (ref 1.7–7.7)
Neutrophils Relative %: 64 % (ref 43–77)

## 2010-08-28 LAB — APTT
aPTT: 30 seconds (ref 24–37)
aPTT: 32 seconds (ref 24–37)

## 2010-08-28 LAB — LIPID PANEL
Cholesterol: 128 mg/dL (ref 0–200)
HDL: 43 mg/dL (ref >39)
LDL Cholesterol: 60 mg/dL (ref 0–99)
Total CHOL/HDL Ratio: 3 RATIO
Triglycerides: 126 mg/dL (ref <150)
VLDL: 25 mg/dL (ref 0–40)

## 2010-08-28 LAB — GLUCOSE, CAPILLARY: Glucose-Capillary: 98 mg/dL (ref 70–99)

## 2010-08-28 LAB — POCT CARDIAC MARKERS
CKMB, poc: <1 ng/mL — ABNORMAL LOW (ref 1.0–8.0)
Myoglobin, poc: 59.7 ng/mL (ref 12–200)
Troponin i, poc: 0.06 ng/mL (ref 0.00–0.09)

## 2010-08-28 LAB — TROPONIN I: Troponin I: 0.03 ng/mL (ref 0.00–0.06)

## 2010-08-28 LAB — HEMOGLOBIN A1C
Hgb A1c MFr Bld: 5.4 % (ref <5.7)
Mean Plasma Glucose: 108 mg/dL (ref <117)

## 2010-08-28 LAB — CARDIAC PANEL(CRET KIN+CKTOT+MB+TROPI)
CK, MB: 3.1 ng/mL (ref 0.3–4.0)
Relative Index: INVALID (ref 0.0–2.5)
Total CK: 93 U/L (ref 7–177)
Troponin I: 0.41 ng/mL — ABNORMAL HIGH (ref 0.00–0.06)

## 2010-08-28 LAB — CK TOTAL AND CKMB (NOT AT ARMC)
CK, MB: 1.4 ng/mL (ref 0.3–4.0)
Relative Index: INVALID (ref 0.0–2.5)
Total CK: 98 U/L (ref 7–177)

## 2010-10-23 NOTE — Assessment & Plan Note (Signed)
[facility]                         GASTROENTEROLOGY OFFICE NOTE   NAME:[name], [name] CHAMBERLAIN                      MRN:          [id number]  DATE:03/12/2007                            DOB:          [birth year]    CHIEF COMPLAINT:  Chest pain.   HISTORY:  [name] is a very nice 60 year old female known to Dr. Lina Sar, who was last seen approximately 2 years ago.  She does have a  history of IBS, proctalgia fugax, and dyspepsia.  She also has a history  of documented coronary artery disease, having undergone cardiac cath  June 2008 per Dr. Garnette Scheuermann, for recurring atypical chest pain.  She  was found to have significant coronary artery calcification in the left  anterior descending and also in the mid right coronary.  She has  obstructive disease in the left coronary system with a 70-80% first  septal perforator, 50-70% first diagonal and 80% third diagonal.  Irregularities with up 50% narrowing in the left anterior descending,  mid to distal segment, no high-grade obstruction in the LAD.  Circumflex  and right coronaries are widely patent but also luminal irregularity  within the mid right.  She was placed at that time on aggressive risk  factor modification, blood pressure control, aspirin, and p.r.n.  nitroglycerin.  She has also undergone a previous gallbladder workup,  both in April 2008 and then 2-3 years prior to that for the same chest  pain.  She has had a negative ultrasound and a normal CCK HIDA scan with  a gallbladder ejection fraction of 50%.  Previous endoscopy done in 2004  was normal.  She did have biopsies taken from the distal esophagus which  showed some mild changes consistent with reflux esophagitis.  She has  been on long-term Nexium 1 p.o. daily.  After her episode in April, she  has frequently been taking 2 Nexium per day and has had a couple of  recent episodes which have worried her.  Twice in the past week, she has  had chest  pain through to her back.  One episode was 2 nights ago which  she describes as excruciating and becomes tearful when she is talking  about it.  She says she feels like she has a bowling ball in her chest  and that her chest might just explode.  She gets nauseated at times with  these episodes, does not generally vomit.  She does not have diaphoresis  or shortness of breath, occasionally has some dizziness and with this  episode 2 nights ago, had some very brief transient jaw pain and some  tingling in her right hand.  She tried a total of 3 nitroglycerin and 2  Librax and then eventually after about 40 minutes of excruciating pain,  it finally started easing off.  Prior to that, she had had an episode  within about 20 minutes of eating some greasy food.  She is quite  concerned about her pain because she does not know what is causing it  and what she should do when she has an episode.  She also  saw her  cardiologist earlier today and was started on Imdur at 30 mg per day.  They also prescribed over-the-counter omeprazole for her because she  says she cannot afford the Nexium.   CURRENT MEDICATIONS:  1. Nexium 1-2 daily.  2. Atenolol 25 daily.  3. Simvastatin 40 daily.  4. Aspirin 81 mg daily.  5. Acyclovir 40 b.i.d.  6. Climara patch weekly.  7. Multivitamin daily.  8. Vitamin D daily.  9. Librax p.r.n.   ALLERGIES:  SULFA - ITCHING.   EXAMINATION:  A well-developed thin white female, appears younger than  her stated age.  CARDIOVASCULAR:  Regular rate and rhythm with S1 and S2.  No murmur,  rub, or gallop.  PULMONARY:  Clear to A&P.  ABDOMEN:  Soft and nontender.  There is no palpable mass or  hepatosplenomegaly.  No guarding or rebound.  Bowel sounds are active.   IMPRESSION:  A 60 year old white female with somewhat atypical chest  pain and known documented but not critical coronary artery disease as  well as history of gastroesophageal reflux disease.  Etiology of her   chest pain is not clear, certainly may be having coronary spasms or  anginal symptoms.  Likewise, with the excruciating-type pain she is  describing, could have esophageal spasm, i.e., nutcracker esophagus,  diffuse esophageal spasm, etc.  Her pain is not typical for simple  reflux.  Also, the patient and her husband are concerned about the  possibility of gallbladder disease, but she has not had any kind of  documented evidence to support gallbladder dysfunction.   PLAN:  1. Start Imdur as suggested by her cardiologist.  2. Increase Nexium to 40 mg b.i.d.  3. Schedule esophageal manometry.  4. Discussed the possibility of a surgical referral to discuss      cholecystectomy, however, realizing that a surgeon will be      reluctant to remove her gallbladder at this time, as there has not      been any clear evidence of dysfunction.  She understands this and      will hold on surgical referral.  5. Should she have a prolonged episode of chest pain unrelieved by      nitroglycerin, she was advised to proceed to the emergency room.  6. Follow up with Dr. Juanda Chance in 3 weeks or sooner p.r.n.      Mike Gip, PA-C  Electronically Signed      Iva Boop, MD,FACG  Electronically Signed   AE/MedQ  DD: 03/12/2007  DT: 03/13/2007  Job #: [id number]   cc:   Hedwig Morton. Juanda Chance, MD

## 2010-10-23 NOTE — H&P (Signed)
NAMEANDERA, CRANMER               ACCOUNT NO.:  [id number]   MEDICAL RECORD NO.:  [id number]          PATIENT TYPE:  INP   LOCATION:  2008                         FACILITY:  [facility]   PHYSICIAN:  Bevelyn Buckles. Bensimhon, MDDATE OF BIRTH:  1950-07-02   DATE OF ADMISSION:  11/02/2007  DATE OF DISCHARGE:                              HISTORY & PHYSICAL   PRIMARY CARDIOLOGIST:  Lyn Records, M.D.   REASON FOR ADMISSION:  Chest pain.   Ms. [name] is a 60 year old woman with history of chronic chest pain.  She also has a history of irritable bowel disease, gastroesophageal  reflux disease and esophageal spasm.  She underwent cardiac CT, which  was markedly abnormal, showing significant coronary calcification.  Subsequently she underwent cardiac catheterization November 2008, which  showed significant branch vessel disease in The LAD.  Ejection fraction  was 60%.  The left main was normal.  The LAD was calcified.  There was  20-30% lesion in the mid section, followed by a 50% lesion more  distally.  In the first diagonal there was a 50-70% ostial lesion in the  large vessel.  The second diagonal was okay.  A third diagonal had a 90%  ostial lesion, which was impinging slightly on the LAD.  On the first  septal branch there was an 80% ostial lesion.  The left circumflex had  minor luminal irregularities.  The right coronary artery had a 20-30%  lesion in the mid section.  She was treated medically.  She was having  chest pain over the past few months.  She saw Dr. Katrinka Blazing about a month  ago and was referred to cardiac rehabilitation; she has been doing okay  there, but she says they have been holding her back.  She does note a  decreased exercise tolerance when she has been there.  Today she was at  a wedding all day without any chest pain or shortness of breath.  Tonight when she got home she had some left arm pain; this radiated into  her chest.  She took 3 nitroglycerin then without relief,  so she came to  the emergency room.  She is now chest pain free.  EKG and the first set  of point-of-care markers were normal.  She denies any reflux or  abdominal discomfort.   REVIEW OF SYSTEMS:  Mostly notable for increased emotional stress, due  to her son who has trouble with bipolar disorder and drug abuse.  She  has not had fevers, chills, nausea, vomiting, cough, bright red blood  per rectum, melena or focal neurologic defects.  The remainder of the  review of systems, except for HPI and problem list.   PROBLEM LIST:  1. Chronic chest pain.  2. Coronary artery disease, branch vessel -- as per cardiac      catheterization in November 2008.  3. Irritable bowel disease.  4. Gastroesophageal reflux disease.  5. Esophageal spasm.   CURRENT MEDICATIONS:  1. Acyclovir 400 b.i.d.  2. Atenolol 25 mg a day.  3. Imdur 30 mg a day.  4. Nexium 40 mg a day.  5. Lisinopril 40 mg a day.  6. Aspirin 325 a day.  7. Climara 0.5 patch once a week.  8. Multivitamin.  9. Librax 5 mg/2.5 mg p.r.n.  10.Nitroglycerin sublingual p.r.n.   ALLERGIES:  1. LEVSIN.  2. HYOSCYAMINE.   SOCIAL HISTORY:  She is married.  She has 2 children.  She mostly takes  care of her grandchildren.  Denies any tobacco; occasional alcohol.  Drinks 1 or 2 glasses of wine a few days a week.   FAMILY HISTORY:  Her father died at 32, he had a history of coronary  artery disease.  Mother is alive at 82, also has heart problems but  unclear exactly what type.  There are 7 siblings.  Her brother had a  myocardial infarction at age 57.   PHYSICAL EXAM:  She is well-appearing, in no acute distress.  Respirations were unlabored.  Blood pressure is 140/76 and a pulse of  63, saturations were 99% on room air.  HEENT:  Normal.  NECK:  Supple.  No JVD.  Carotids were 2+ bilaterally and no bruits.  There was no lymphadenopathy or thyromegaly.  CARDIAC:  PMI was nondisplaced.  Regular rate and rhythm.  No murmurs,  rubs  or gallops.  LUNGS:  Clear.  ABDOMEN: Soft, nontender, nondistended.  No hepatosplenomegaly.  No  bruits.  No masses.  Good bowel sounds.  EXTREMITIES:  Warm with no cyanosis, clubbing or edema.  No rash.  Good  pulses.  NEUROLOGIC:  Alert and oriented x3.  Cranial nerves II-XII are intact.  Moves all 4 extremities without difficulty.  Affect was pleasant.   LABORATORY STUDIES:  Chest x-ray shows questionable COPD, otherwise  normal.  White count 6.5, hemoglobin 12.5, platelets 250, Sodium 140,  potassium 4.0, chloride 105, BUN 19, creatinine 0.8, glucose 89.  Point-  of-care Markers:  CK-MB was less than 1.0.  Troponin I was less than  0.05.  EKG shows sinus rhythm at a rate of 60.  There was no ST-T wave  abnormalities.   ASSESSMENT/PLAN:  1. Acute/chronic atypical chest pain.  The first set of cardiac      markers and EKG were normal.  2. Branch vessel coronary artery disease, as per cardiac      catheterization November 2008.  3. Irritable bowel/esophageal spasm.  4. Hypertension.   PLAN/DISCUSSION:  Her pain was mostly atypical.  There was no objective  evidence of ischemia.  We will bring him for 23-hour observation to rule  out myocardial infarction.  Decision regarding catheterization versus  stress test versus medical therapy will be per Dr. Katrinka Blazing in the morning.  Consider adding Plavix.      Bevelyn Buckles. Bensimhon, MD  Electronically Signed    DRB/MEDQ  D:  11/02/2007  T:  11/02/2007  Job:  [id number]

## 2010-10-23 NOTE — Op Note (Signed)
NAMEVENETIA, [name]               ACCOUNT NO.:  [id number]   MEDICAL RECORD NO.:  [id number]          PATIENT TYPE:  AMB   LOCATION:  ENDO                         FACILITY:  [facility]   PHYSICIAN:  Iva Boop, MD,FACGDATE OF BIRTH:  1950/08/19   DATE OF PROCEDURE:  03/23/2007  DATE OF DISCHARGE:  03/23/2007                               OPERATIVE REPORT   PROCEDURE PERFORMED:  Esophageal manometry.   INDICATIONS FOR PROCEDURE:  Chest pain, cough.   FINDINGS:  Endoscopy unit nursing staff performed the procedure.  The  tracings were reviewed and deemed adequate for interpretation.  This is  a technically good study.   1. The lower esophageal sphincter is located 39.5 to 43 cm from the      nares.  The length is 3.5 cm and the pressure inversion point is at      41.5 cm.  2. The lower esophageal sphincter pressure is 27 mmHg, which is      normal.  3. The lower esophageal sphincter relaxation is normal.  4. Peristalsis is normal.  There is some mild high amplitudes, but I      do not think this is clinically significant.  5. The upper esophageal sphincter is overall normal, there is some      mildly increased residual pressure on the upper esophageal      sphincter.   ASSESSMENT:  This is an overall normal study.   PLAN:  I will review the chart and we will contact the patient with the  results and further plans.  No cause of her symptoms are seen on this  study.      Iva Boop, MD,FACG  Electronically Signed     CEG/MEDQ  D:  03/25/2007  T:  03/26/2007  Job:  [id number]

## 2010-10-23 NOTE — Cardiovascular Report (Signed)
NAMESARON, [name]               ACCOUNT NO.:  [id number]   MEDICAL RECORD NO.:  [id number]          PATIENT TYPE:  OIB   LOCATION:  1962                         FACILITY:  [facility]   PHYSICIAN:  Lyn Records, M.D.   DATE OF BIRTH:  12-09-1950   DATE OF PROCEDURE:  11/28/2006  DATE OF DISCHARGE:                            CARDIAC CATHETERIZATION   INDICATIONS FOR THE STUDY:  Recurring atypical chest discomfort,  abnormal coronary CT angiogram demonstrating evidence of severe coronary  calcification with a coronary calcium score greater of than 250, with  prior normal Cardiolite study several years ago.  This study is being  done to rule out obstructive disease within the calcified segment of the  coronary vasculature.   PROCEDURES PERFORMED:  1. One left heart catheterization.  2. Selective coronary angiography.  3. Left ventriculography.   DESCRIPTION:  A 4-French sheath was placed in the right femoral artery  after 1 mg of IV Versed and normal function of 1% Xylocaine.  Coronary  angiography was performed with a 4-French A2 multipurpose catheter.  This catheter was used for hemodynamic recordings, left ventriculography  by hand injection, and selective right coronary angiography.  A #4 left  Judkins catheter was used left coronary angiography.  We also gave 200  mcg of intracoronary nitroglycerin during left coronary study.   RESULTS:   HEMODYNAMIC DATA:  1. Aortic pressure 140/70.  2. Left ventricular pressure 140/10 mmHg.   LEFT VENTRICULOGRAPHY:  The left ventricle is normal in size.  Contractility is normal.  Ejection fraction is 60%.   CORONARY ANGIOGRAPHY:  Cinefluoroscopy demonstrates heavy left main,  left anterior descending, and mid right coronary calcification..   1. Left main coronary artery:  The left main coronary artery is short      but widely patent.  No significant obstruction is seen.  2. Left anterior descending coronary artery:  Left anterior  descending      coronary artery is heavily calcified.  The vessel is transapical.      There are three diagonal branches.  The first diagonal is a large      branching vessel that contains proximal/ostial narrowing of 50-70%.      This region is calcified.  Branches are free of any significant      obstruction.  The first septal perforator contains an ostial 80%      stenosis.  This is a moderate-sized septal perforator.  The      proximal and mid LAD contains luminal irregularities with 20-30%      narrowing throughout that region.  The second diagonal is widely      patent.  The third diagonal contains 90% ostial narrowing within a      region of calcification that obstructs the main channel of the left      anterior descending mid to distal vessel by 50%.  3. Circumflex artery:  The circumflex coronary artery is smooth and      contains mid luminal irregularities and bifurcates into two small      obtuse marginal branches distally.  No significant obstruction is  felt to be present.  There is mid luminal irregularity.  4. Right coronary artery:  The right coronary artery is a dominant      vessel, gives origin to the PDA and left ventricular branches.      Moderate calcification is noted in the midvessel, 20-30% narrowing      is noted in the mid region where calcification is seen.  No high-      grade obstruction is noted.  PDA is large and bifurcates and is      free of any significant disease.  The continuation of the right      coronary gives two left ventricular branches that are also free of      any significant disease.   CONCLUSIONS:  1. Significant coronary artery calcification involving the left      anterior descending predominantly.  There is also mid right      coronary calcification.  2. Obstructive coronary disease in the left coronary system with a 70-      80% first septal perforator, 50-70% first diagonal and 80% third      diagonal.  Irregularities with up to  50% narrowing in the left      anterior descending artery mid to distal segment within regions of      calcification.  No high-grade obstructive left anterior descending      artery lesion is noted.  Circumflex and right coronaries are a      widely patent, although there are luminal irregularities within the      mid-right.  3. Normal left ventricular function.   PLAN:  1. Aggressive risk factor modification.  2. Sublingual nitroglycerin for recurrent episodes of chest      discomfort.  3. Aggressive blood pressure control.  4. Aspirin.  5. Low-dose beta blocker therapy.      Lyn Records, M.D.  Electronically Signed     HWS/MEDQ  D:  11/28/2006  T:  11/28/2006  Job:  [id number]   cc:   Deboraha Sprang at [facility]

## 2010-10-23 NOTE — Assessment & Plan Note (Signed)
[facility]                         GASTROENTEROLOGY OFFICE NOTE   NAME:[name] [name] EARP                      MRN:          [id number]  DATE:04/24/2007                            DOB:          30-Apr-1951    [name] is a 60 year old white female with a history of irritable  bowel syndrome, proctalgia, ureteral rectal spasm, and now with  suspected esophageal spasm and gastroesophageal reflux disease. She had  a cardiac evaluation for chest pain in June 2008, which showed  significant coronary artery disease, but her current chest pain does not  seem to be interpreted as cardiac. Her last upper endoscopy in September  2004 showed a normal exam. She had an esophageal manometry which was  entirely normal and she had a 24 hour pH study, which apparently was  normal as well. The patient's chest pain occurred while taking Nexium.  Librax seemed to have no help with the pain. Her HIDA scan with CCK  showed a normal ejection fraction of 55%. She has been on isosorbide 30  mg daily for esophageal spasm with some improvement of her symptoms. She  really has not had any bad attack. She seems to be doing well  on the  isosorbide.   PHYSICAL EXAMINATION:  VITAL SIGNS:  Blood pressure 102/60, pulse 70,  weight 129 pounds.  GENERAL:  The patient was not examined today.   IMPRESSION:  1. This is a 60 year old white female with chest pain due to      esophageal spasm.  2. 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  3. Irritable bowel syndrome.  4. Gastroesophageal reflux.  5. Coronary artery disease status post a PCI in June 2008 with 70% and      80% occlusion of the first septal perforator and 50% to 70% first      diagonal, 80% third diagonal. No high grade obstructive LAD lesion.      She had normal left ventricular fraction. She has been treated with      sublingual nitroglycerin.   PLAN:  1. Percocet 5/325 mg p.r.n. severe chest pain.  2. Continue all  other medications for reflux and for spasm. Samples of      Nexium 40 mg daily were given.     Hedwig Morton. Juanda Chance, MD  Electronically Signed    DMB/MedQ  DD: 04/29/2007  DT: 04/30/2007  Job #: [id number]

## 2010-12-19 ENCOUNTER — Other Ambulatory Visit (HOSPITAL_COMMUNITY): Payer: Self-pay | Admitting: Internal Medicine

## 2010-12-19 DIAGNOSIS — Z1231 Encounter for screening mammogram for malignant neoplasm of breast: Secondary | ICD-10-CM

## 2011-01-25 ENCOUNTER — Ambulatory Visit (HOSPITAL_COMMUNITY)
Admission: RE | Admit: 2011-01-25 | Discharge: 2011-01-25 | Disposition: A | Discharge status: Home / Self Care | Payer: 59 | Source: Ambulatory Visit | Attending: Internal Medicine | Admitting: Internal Medicine

## 2011-01-25 DIAGNOSIS — Z1231 Encounter for screening mammogram for malignant neoplasm of breast: Secondary | ICD-10-CM | POA: Insufficient documentation

## 2011-03-06 LAB — DIFFERENTIAL
Basophils Absolute: 0
Basophils Relative: 1
Eosinophils Absolute: 0.1
Eosinophils Relative: 2
Lymphocytes Relative: 28
Lymphs Abs: 1.8
Monocytes Absolute: 0.6
Monocytes Relative: 9
Neutro Abs: 3.9
Neutrophils Relative %: 60

## 2011-03-06 LAB — POCT I-STAT, CHEM 8
BUN: 19
Calcium, Ion: 1.1 — ABNORMAL LOW
Chloride: 105
Creatinine, Ser: 0.8
Glucose, Bld: 89
HCT: 38
Hemoglobin: 12.9
Potassium: 4
Sodium: 140
TCO2: 28

## 2011-03-06 LAB — CBC
HCT: 33.1 — ABNORMAL LOW
HCT: 36.7
Hemoglobin: 11.3 — ABNORMAL LOW
Hemoglobin: 12.5
MCHC: 34
MCHC: 34.2
MCV: 86.8
MCV: 87.3
Platelets: 210
Platelets: 250
RBC: 3.79 — ABNORMAL LOW
RBC: 4.23
RDW: 13.5
RDW: 13.6
WBC: 5.4
WBC: 6.5

## 2011-03-06 LAB — CARDIAC PANEL(CRET KIN+CKTOT+MB+TROPI)
CK, MB: 1
CK, MB: 1
Relative Index: INVALID
Relative Index: INVALID
Total CK: 70
Total CK: 72
Troponin I: 0.01
Troponin I: <0.01

## 2011-03-06 LAB — POCT CARDIAC MARKERS
CKMB, poc: <1 — ABNORMAL LOW
Myoglobin, poc: 39.9
Operator id: 277751
Troponin i, poc: <0.05

## 2011-03-06 LAB — LIPID PANEL
Cholesterol: 134
HDL: 38 — ABNORMAL LOW
LDL Cholesterol: 85
Total CHOL/HDL Ratio: 3.5
Triglycerides: 53
VLDL: 11

## 2011-03-06 LAB — HEPARIN LEVEL (UNFRACTIONATED)
Heparin Unfractionated: 0.52
Heparin Unfractionated: 0.59

## 2011-08-19 ENCOUNTER — Other Ambulatory Visit: Payer: Self-pay | Admitting: Physical Medicine and Rehabilitation

## 2011-08-19 DIAGNOSIS — M961 Postlaminectomy syndrome, not elsewhere classified: Secondary | ICD-10-CM

## 2011-08-28 ENCOUNTER — Ambulatory Visit
Admission: RE | Admit: 2011-08-28 | Discharge: 2011-08-28 | Disposition: A | Discharge status: Home / Self Care | Payer: 59 | Source: Ambulatory Visit | Attending: Physical Medicine and Rehabilitation | Admitting: Physical Medicine and Rehabilitation

## 2011-08-28 DIAGNOSIS — M961 Postlaminectomy syndrome, not elsewhere classified: Secondary | ICD-10-CM

## 2011-08-28 MED ORDER — GADOBENATE DIMEGLUMINE 529 MG/ML IV SOLN
10.0000 mL | Freq: Once | INTRAVENOUS | Status: AC | PRN
  Administered 2011-08-28: 10 mL via INTRAVENOUS

## 2011-10-05 ENCOUNTER — Emergency Department (HOSPITAL_BASED_OUTPATIENT_CLINIC_OR_DEPARTMENT_OTHER)
Admission: EM | Admit: 2011-10-05 | Discharge: 2011-10-05 | Disposition: A | Discharge status: Home / Self Care | Payer: 59 | Attending: Emergency Medicine | Admitting: Emergency Medicine

## 2011-10-05 ENCOUNTER — Emergency Department (INDEPENDENT_AMBULATORY_CARE_PROVIDER_SITE_OTHER): Payer: 59

## 2011-10-05 ENCOUNTER — Encounter (HOSPITAL_BASED_OUTPATIENT_CLINIC_OR_DEPARTMENT_OTHER): Payer: Self-pay | Admitting: Emergency Medicine

## 2011-10-05 DIAGNOSIS — M542 Cervicalgia: Secondary | ICD-10-CM

## 2011-10-05 DIAGNOSIS — Z79899 Other long term (current) drug therapy: Secondary | ICD-10-CM | POA: Insufficient documentation

## 2011-10-05 DIAGNOSIS — R110 Nausea: Secondary | ICD-10-CM

## 2011-10-05 DIAGNOSIS — S0990XA Unspecified injury of head, initial encounter: Secondary | ICD-10-CM | POA: Insufficient documentation

## 2011-10-05 DIAGNOSIS — K219 Gastro-esophageal reflux disease without esophagitis: Secondary | ICD-10-CM | POA: Insufficient documentation

## 2011-10-05 DIAGNOSIS — I2510 Atherosclerotic heart disease of native coronary artery without angina pectoris: Secondary | ICD-10-CM | POA: Insufficient documentation

## 2011-10-05 DIAGNOSIS — R51 Headache: Secondary | ICD-10-CM

## 2011-10-05 DIAGNOSIS — M47812 Spondylosis without myelopathy or radiculopathy, cervical region: Secondary | ICD-10-CM | POA: Insufficient documentation

## 2011-10-05 DIAGNOSIS — K589 Irritable bowel syndrome without diarrhea: Secondary | ICD-10-CM | POA: Insufficient documentation

## 2011-10-05 HISTORY — DX: Gastro-esophageal reflux disease without esophagitis: K21.9

## 2011-10-05 HISTORY — DX: Irritable bowel syndrome, unspecified: K58.9
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
## 2011-10-05 NOTE — Discharge Instructions (Signed)
Concussion and Brain Injury A blow or jolt to the head can disrupt the normal function of the brain. This type of brain injury is often called a "concussion" or a "closed head injury." Concussions are usually not life-threatening. Even so, the effects of a concussion can be serious.  CAUSES  A concussion is caused by a blunt blow to the head. The blow might be direct or indirect as described below.  Direct blow (running into another player during a soccer game, being hit in a fight, or hitting your head on a hard surface).   Indirect blow (when your head moves rapidly and violently back and forth like in a car crash).  SYMPTOMS  The brain is very complex. Every head injury is different. Some symptoms may appear right away. Other symptoms may not show up for days or weeks after the concussion. The signs of concussion can be hard to notice. Early on, problems may be missed by patients, family members, and caregivers. You may look fine even though you are acting or feeling differently.  These symptoms are usually temporary, but may last for days, weeks, or even longer. Symptoms include:  Mild headaches that will not go away.   Having more trouble than usual with:   Remembering things.   Paying attention or concentrating.   Organizing daily tasks.   Making decisions and solving problems.   Slowness in thinking, acting, speaking, or reading.   Getting lost or easily confused.   Feeling tired all the time or lacking energy (fatigue).   Feeling drowsy.   Sleep disturbances.   Sleeping more than usual.   Sleeping less than usual.   Trouble falling asleep.   Trouble sleeping (insomnia).   Loss of balance or feeling lightheaded or dizzy.   Nausea or vomiting.   Numbness or tingling.   Increased sensitivity to:   Sounds.   Lights.   Distractions.  Other symptoms might include:  Vision problems or eyes that tire easily.   Diminished sense of taste or smell.   Ringing  in the ears.   Mood changes such as feeling sad, anxious, or listless.   Becoming easily irritated or angry for little or no reason.   Lack of motivation.  DIAGNOSIS  Your caregiver can usually diagnose a concussion or mild brain injury based on your description of your injury and your symptoms.  Your evaluation might include:  A brain scan to look for signs of injury to the brain. Even if the test shows no injury, you may still have a concussion.   Blood tests to be sure other problems are not present.  TREATMENT   People with a concussion need to be examined and evaluated. Most people with concussions are treated in an emergency department, urgent care, or clinic. Some people must stay in the hospital overnight for further treatment.   Your caregiver will send you home with important instructions to follow. Be sure to carefully follow them.   Tell your caregiver if you are already taking any medicines (prescription, over-the-counter, or natural remedies), or if you are drinking alcohol or taking illegal drugs. Also, talk with your caregiver if you are taking blood thinners (anticoagulants) or aspirin. These drugs may increase your chances of complications. All of this is important information that may affect treatment.   Only take over-the-counter or prescription medicines for pain, discomfort, or fever as directed by your caregiver.  PROGNOSIS  How fast people recover from brain injury varies from person to person.   Although most people have a good recovery, how quickly they improve depends on many factors. These factors include how severe their concussion was, what part of the brain was injured, their age, and how healthy they were before the concussion.  Because all head injuries are different, so is recovery. Most people with mild injuries recover fully. Recovery can take time. In general, recovery is slower in older persons. Also, persons who have had a concussion in the past or have  other medical problems may find that it takes longer to recover from their current injury. Anxiety and depression may also make it harder to adjust to the symptoms of brain injury. HOME CARE INSTRUCTIONS  Return to your normal activities slowly, not all at once. You must give your body and brain enough time for recovery.  Get plenty of sleep at night, and rest during the day. Rest helps the brain to heal.   Avoid staying up late at night.   Keep the same bedtime hours on weekends and weekdays.   Take daytime naps or rest breaks when you feel tired.   Limit activities that require a lot of thought or concentration (brain or cognitive rest). This includes:   Homework or job-related work.   Watching TV.   Computer work.   Avoid activities that could lead to a second brain injury, such as contact or recreational sports, until your caregiver says it is okay. Even after your brain injury has healed, you should protect yourself from having another concussion.   Ask your caregiver when you can return to your normal activities such as driving, bicycling, or operating heavy equipment. Your ability to react may be slower after a brain injury.   Talk with your caregiver about when you can return to work or school.   Inform your teachers, school nurse, school counselor, coach, Product/process development scientist, or work Freight forwarder about your injury, symptoms, and restrictions. They should be instructed to report:   Increased problems with attention or concentration.   Increased problems remembering or learning new information.   Increased time needed to complete tasks or assignments.   Increased irritability or decreased ability to cope with stress.   Increased symptoms.   Take only those medicines that your caregiver has approved.   Do not drink alcohol until your caregiver says you are well enough to do so. Alcohol and certain other drugs may slow your recovery and can put you at risk of further injury.    If it is harder than usual to remember things, write them down.   If you are easily distracted, try to do one thing at a time. For example, do not try to watch TV while fixing dinner.   Talk with family members or close friends when making important decisions.   Keep all follow-up appointments. Repeated evaluation of your symptoms is recommended for your recovery.  PREVENTION  Protect your head from future injury. It is very important to avoid another head or brain injury before you have recovered. In rare cases, another injury has lead to permanent brain damage, brain swelling, or death. Avoid injuries by using:  Seatbelts when riding in a car.   Alcohol only in moderation.   A helmet when biking, skiing, skateboarding, skating, or doing similar activities.   Safety measures in your home.   Remove clutter and tripping hazards from floors and stairways.   Use grab bars in bathrooms and handrails by stairs.   Place non-slip mats on floors and in bathtubs.  Improve lighting in dim areas.  SEEK MEDICAL CARE IF:  A head injury can cause lingering symptoms. You should seek medical care if you have any of the following symptoms for more than 3 weeks after your injury or are planning to return to sports:  Chronic headaches.   Dizziness or balance problems.   Nausea.   Vision problems.   Increased sensitivity to noise or light.   Depression or mood swings.   Anxiety or irritability.   Memory problems.   Difficulty concentrating or paying attention.   Sleep problems.   Feeling tired all the time.  SEEK IMMEDIATE MEDICAL CARE IF:  You have had a blow or jolt to the head and you (or your family or friends) notice:  Severe or worsening headaches.   Weakness (even if only in one hand or one leg or one part of the face), numbness, or decreased coordination.   Repeated vomiting.   Increased sleepiness or passing out.   One black center of the eye (pupil) is larger  than the other.   Convulsions (seizures).   Slurred speech.   Increasing confusion, restlessness, agitation, or irritability.   Lack of ability to recognize people or places.   Neck pain.   Difficulty being awakened.   Unusual behavior changes.   Loss of consciousness.  Older adults with a brain injury may have a higher risk of serious complications such as a blood clot on the brain. Headaches that get worse or an increase in confusion are signs of this complication. If these signs occur, see a caregiver right away. MAKE SURE YOU:   Understand these instructions.   Will watch your condition.   Will get help right away if you are not doing well or get worse.  FOR MORE INFORMATION  Several groups help people with brain injury and their families. They provide information and put people in touch with local resources. These include support groups, rehabilitation services, and a variety of health care professionals. Among these groups, the Brain Injury Association (BIA, www.biausa.org) has a Secretary/administrator that gathers scientific and educational information and works on a national level to help people with brain injury.  Document Released: 08/17/2003 Document Revised: 05/16/2011 Document Reviewed: 01/13/2008 [facility] Patient Information 2012 [place], [place].   He can take your oxycodone as needed for pain followup with Dr. Ranell Patrick if your shoulder neck arm or leg or not feeling better by next week. Return here if you feel worse for any reason

## 2011-10-10 ENCOUNTER — Other Ambulatory Visit: Payer: Self-pay | Admitting: Orthopedic Surgery

## 2011-10-10 DIAGNOSIS — M25512 Pain in left shoulder: Secondary | ICD-10-CM

## 2011-10-12 ENCOUNTER — Ambulatory Visit
Admission: RE | Admit: 2011-10-12 | Discharge: 2011-10-12 | Disposition: A | Discharge status: Home / Self Care | Payer: 59 | Source: Ambulatory Visit | Attending: Orthopedic Surgery | Admitting: Orthopedic Surgery

## 2011-10-12 DIAGNOSIS — M25512 Pain in left shoulder: Secondary | ICD-10-CM

## 2011-12-23 HISTORY — PX: SHOULDER ARTHROSCOPY: SHX128

## 2011-12-24 ENCOUNTER — Other Ambulatory Visit: Payer: Self-pay | Admitting: Dermatology

## 2012-01-02 ENCOUNTER — Other Ambulatory Visit: Payer: Self-pay | Admitting: Internal Medicine

## 2012-01-02 DIAGNOSIS — Z1231 Encounter for screening mammogram for malignant neoplasm of breast: Secondary | ICD-10-CM

## 2012-01-31 ENCOUNTER — Ambulatory Visit
Admission: RE | Admit: 2012-01-31 | Discharge: 2012-01-31 | Disposition: A | Discharge status: Home / Self Care | Payer: 59 | Source: Ambulatory Visit | Attending: Internal Medicine | Admitting: Internal Medicine

## 2012-01-31 DIAGNOSIS — Z1231 Encounter for screening mammogram for malignant neoplasm of breast: Secondary | ICD-10-CM

## 2012-03-24 ENCOUNTER — Other Ambulatory Visit: Payer: Self-pay | Admitting: Interventional Cardiology

## 2012-03-26 ENCOUNTER — Encounter (HOSPITAL_BASED_OUTPATIENT_CLINIC_OR_DEPARTMENT_OTHER)
Admission: RE | Disposition: A | Discharge status: Home / Self Care | Payer: Self-pay | Source: Ambulatory Visit | Attending: Interventional Cardiology

## 2012-03-26 ENCOUNTER — Other Ambulatory Visit: Payer: Self-pay

## 2012-03-26 ENCOUNTER — Ambulatory Visit (HOSPITAL_COMMUNITY)
Admit: 2012-03-26 | Discharge: 2012-03-26 | Disposition: A | Discharge status: Home / Self Care | Payer: 59 | Attending: Interventional Cardiology | Admitting: Interventional Cardiology

## 2012-03-26 ENCOUNTER — Inpatient Hospital Stay (HOSPITAL_BASED_OUTPATIENT_CLINIC_OR_DEPARTMENT_OTHER)
Admission: RE | Admit: 2012-03-26 | Discharge: 2012-03-26 | Disposition: A | Discharge status: Home / Self Care | Payer: 59 | Source: Ambulatory Visit | Attending: Interventional Cardiology | Admitting: Interventional Cardiology

## 2012-03-26 DIAGNOSIS — R079 Chest pain, unspecified: Secondary | ICD-10-CM | POA: Insufficient documentation

## 2012-03-26 DIAGNOSIS — I2510 Atherosclerotic heart disease of native coronary artery without angina pectoris: Secondary | ICD-10-CM | POA: Insufficient documentation

## 2012-03-26 SURGERY — JV LEFT HEART CATHETERIZATION WITH CORONARY ANGIOGRAM
Anesthesia: Moderate Sedation

## 2012-03-26 MED ORDER — ACETAMINOPHEN 325 MG PO TABS: 650.0000 mg | ORAL_TABLET | ORAL | Status: DC | PRN

## 2012-03-26 MED ORDER — SODIUM CHLORIDE 0.9 % IV SOLN: INTRAVENOUS | Status: DC

## 2012-03-26 MED ORDER — ASPIRIN 81 MG PO CHEW
324.0000 mg | CHEWABLE_TABLET | ORAL | Status: AC
  Administered 2012-03-26: 324 mg via ORAL

## 2012-03-26 MED ORDER — SODIUM CHLORIDE 0.9 % IJ SOLN: 3.0000 mL | INTRAMUSCULAR | Status: DC | PRN

## 2012-03-26 MED ORDER — SODIUM CHLORIDE 0.9 % IV SOLN: 250.0000 mL | INTRAVENOUS | Status: DC | PRN

## 2012-03-26 MED ORDER — ONDANSETRON HCL 4 MG/2ML IJ SOLN: 4.0000 mg | Freq: Four times a day (QID) | INTRAMUSCULAR | Status: DC | PRN

## 2012-03-26 MED ORDER — SODIUM CHLORIDE 0.9 % IJ SOLN: 3.0000 mL | Freq: Two times a day (BID) | INTRAMUSCULAR | Status: DC

## 2012-03-26 MED ORDER — DIAZEPAM 5 MG PO TABS
5.0000 mg | ORAL_TABLET | ORAL | Status: AC
  Administered 2012-03-26: 5 mg via ORAL

## 2012-03-26 NOTE — OR Nursing (Signed)
+  Allen's test right hand 

## 2012-03-26 NOTE — H&P (Signed)
The patient has had progressive angina despite up titration of medications. She is known to have relatively diffuse coronary disease documented by catheterization greater than 5 years ago. A nuclear study done within the past 12 months and not reveal ischemia. I am concerned about matched three-vessel coronary disease. The procedure and risks including stroke death myocardial infarction allergy kidney injury limb ischemia among others were discussed in detail and accepted.

## 2012-03-26 NOTE — OR Nursing (Signed)
Discharge instructions reviewed and signed, pt stated understanding, ambulated in hall without difficulty, TR band removed and pressure dressing and wrist splint applied, transported to husband's car via wheelchair

## 2012-03-26 NOTE — Progress Notes (Signed)
3cc was removed from TR Band and sats on rt.thumb 95-97%.

## 2012-03-26 NOTE — OR Nursing (Signed)
Dr Smith at bedside to discuss results and treatment plan with pt and family 

## 2012-03-26 NOTE — OR Nursing (Signed)
Chest pain remains unchanged after 0.4mg  ntg sl, bp 101/60, pain rated at 4/10

## 2012-03-26 NOTE — CV Procedure (Signed)
     Diagnostic Cardiac Catheterization Report  [name]  61 y.o.  female Sep 28, 1950  Procedure Date: 03/26/2012 Referring Physician: Elby Showers, M.D. Primary Cardiologist:: Gwynneth Albright, M.D.   PROCEDURE:  Left heart catheterization with selective coronary angiography, left ventriculogram.  INDICATIONS:  61 year old with progressive angina pectoris and known prior history of CAD documented by catheterization. Nuclear study within the past year was normal and the studies being done to rule out matched three-vessel ischemia. Symptoms have been occurring oh with minimal physical activity and is preventing an active lifestyle to  The risks, benefits, and details of the procedure were explained to the patient.  The patient verbalized understanding and wanted to proceed.  Informed written consent was obtained.  PROCEDURE TECHNIQUE:  After Xylocaine anesthesia a 5 French sheath was placed in the right  radial artery with a single anterior needle wall stick.   Coronary angiography was done using a 5 [place] JR 4 and JL 3 Launcher guide catheter.  Left ventriculography was done using a JR 4 catheter and hand injection..    CONTRAST:  Total of 100 cc.  COMPLICATIONS:  None.    HEMODYNAMICS:  Aortic pressure was 112/58 mmHg; LV pressure was 127/13 mmHg; LVEDP 16 mm mercury.  There was no gradient between the left ventricle and aorta.    ANGIOGRAPHIC DATA:   The left main coronary artery is very short, heavily calcified, but widely patent..  The left anterior descending artery is heavy calcification with 30-40% narrowing in the proximal, and eccentric 50-60% stenosis in the mid vessel. A large first diagonal contains proximal/ostial 50% narrowing the third diagonal contains 90% narrowing the second diagonal contains no significant obstruction. The LAD wraps around the left ventricular apex. The first septal perforator contains 80% ostial narrowing.  The left circumflex artery  is nondominant and widely patent. There is less than 50% ostial narrowing likely do to catheter induced spasm..  The right coronary artery is segmental less than 40% narrowing in the proximal one third of the vessel. The RCA is dominant.Marland Kitchen  LEFT VENTRICULOGRAM:  Left ventricular angiogram was done in the 30 RAO projection and revealed normal left ventricular wall motion and systolic function with an estimated ejection fraction of 65 %.  IMPRESSIONS:  1. Widely patent circumflex, RCA, and proximal LAD. The mid LAD contains greater than 50% stenosis to less than 70 a focal eccentric region that overlaps the origin of the third diagonal. The diagonal contains a 90% ostial narrowing and is unchanged from the prior study.  2. Normal left ventricular function   RECOMMENDATION:  Continue medical therapy. Consider alternative sources of chest discomfort.Marland Kitchen

## 2012-03-26 NOTE — Progress Notes (Signed)
3cc removed from TR Band sats holding around 95-98% on the rt. Thumb

## 2012-03-26 NOTE — OR Nursing (Signed)
Meal served 

## 2012-03-27 ENCOUNTER — Encounter (HOSPITAL_BASED_OUTPATIENT_CLINIC_OR_DEPARTMENT_OTHER): Payer: Self-pay

## 2012-03-27 ENCOUNTER — Telehealth: Payer: Self-pay | Admitting: Internal Medicine

## 2012-03-27 NOTE — Telephone Encounter (Signed)
I agree with the plan.

## 2012-03-27 NOTE — Telephone Encounter (Signed)
Patient states she has had multiple health issues over the last year. She has been seeing her cardiologist. She had a cardiac cath yesterday and was told the chest pain is not heart. She had chest pain last night and took 2 nitroglycerin tablets last night. She has called her cardiologist about all of this. She reports her reflux has been worse lately. She does take Nexium daily. She will increase this to BID until her OV here. She states she also has a hx of esophageal spasms and thinks the chest pain may from the esophagus. Scheduled patient with Willette Cluster, NP on 03/30/12 at 9:00 AM.

## 2012-03-30 ENCOUNTER — Encounter: Payer: Self-pay | Admitting: Nurse Practitioner

## 2012-03-30 ENCOUNTER — Ambulatory Visit (INDEPENDENT_AMBULATORY_CARE_PROVIDER_SITE_OTHER): Payer: 59 | Admitting: Nurse Practitioner

## 2012-03-30 VITALS — BP 120/74 | HR 68 | Ht 62.5 in | Wt 128.0 lb

## 2012-03-30 DIAGNOSIS — R079 Chest pain, unspecified: Secondary | ICD-10-CM

## 2012-03-30 NOTE — Progress Notes (Signed)
03/30/2012 [name] [id number] [birth year]   History of Present Illness:  Patient is a 61 year old female known to Dr. Juanda Chance for history of esophageal spasms. She was last seen in 2011. Patient comes in today for evaluation of chest pain. Patient makes it clear that she has two distinct types of chest pain. She has angina, is followed by cardiology and is taking Ranexa. Patient had a cardiac catheterization a few days ago (see below). Her second type of chest pain has occurred intermittently for years but episodes are coming more frequent. This chest pain is not related to exertion, it often  occurs after meals but has woken her up during the night as well. Episodes of this noncardiac chest pain are severe, nonradiating. No associated dysphagia . Cardiologist told her to take NTG as it may help both angina and esophageal pain. .At onset of symptoms she takes Nitroglycerin and Librax but isn't sure if meds help or if symptoms would have subsided anyway.    Patient feels throat is tight, closing up at night.  She feels like there is a large gas bubble in her chest.   Patient has a history of irritable bowel syndrome. She takes Bentyl as needed for lower abdominal cramps. Librax is reserved for esophageal pain.  Current Medications, Allergies, Past Medical History, Past Surgical History, Family History and Social History were reviewed in [employer] record.  Cardiac Cath 03/26/12  IMPRESSIONS:  1. Widely patent circumflex, RCA, and proximal LAD. The mid LAD contains greater than 50% stenosis to less than 70 a focal eccentric region that overlaps the origin of the third diagonal. The diagonal contains a 90% ostial narrowing and is unchanged from the prior study.  2. Normal left ventricular function  RECOMMENDATION: Continue medical therapy. Consider alternative sources of chest discomfort..    Physical Exam: General: Well developed , white female in no acute  distress Head: Normocephalic and atraumatic Eyes:  sclerae anicteric, conjunctiva pink  Ears: Normal auditory acuity Lungs: Clear throughout to auscultation Heart: Regular rate and rhythm Abdomen: Soft, non tender and non distended. No masses, no hepatomegaly. Normal bowel sounds Musculoskeletal: Symmetrical with no gross deformities  Extremities: No edema  Neurological: Alert oriented x 4, grossly nonfocal Psychological:  Alert and cooperative. Normal mood and affect  Assessment and Recommendations:  1. intermittent chest pain, nonexertional. Episodes becoming more frequent over the last few years. Pain is transient but severe when present. Nitroglycerin and Librax possibly help. Esophageal manometry in 2008 for chest pain was negative. Last endoscopy was in 2004 and was unrevealing but patient doesn't believe problems with chest pain were present at the time of that EGD. She has no dysphasia. It has been over 9 years since last upper endoscopy. Cardiac cath 03/26/12 unchanged from prior study. To rule out any structural lesions, patient will be scheduled for an upper endoscopy. If normal then consider calcium channel blocker if patient's cardiologist agreeable.  2. Multiple drug allergies   3. CAD, s/p cardiac cath on 03/26/12, see above. On Ranexa.

## 2012-03-30 NOTE — Progress Notes (Signed)
I agree with EGD. Also ba esophagram may be a  Consideration. ?lensinSL 125 q4 hrs prn, works faster than Librax

## 2012-03-30 NOTE — Patient Instructions (Addendum)
You have been scheduled for an endoscopy with propofol. Please follow written instructions given to you at your visit today. If you use inhalers (even only as needed), please bring them with you on the day of your procedure. Date : 05-19-2012 @ 4 PM We will put you on a cancellation list for a sooner appointment.  They will call you if there is a sooner date available.

## 2012-03-31 ENCOUNTER — Encounter: Payer: Self-pay | Admitting: Internal Medicine

## 2012-04-10 ENCOUNTER — Telehealth: Payer: Self-pay | Admitting: Internal Medicine

## 2012-04-10 MED ORDER — HYOSCYAMINE SULFATE 0.125 MG SL SUBL: 0.1250 mg | SUBLINGUAL_TABLET | SUBLINGUAL | Status: DC | PRN

## 2012-04-10 NOTE — Telephone Encounter (Signed)
Spoke with patient and told her the medication recommended is listed in her allergy list that it causes itching. She wants to try it and see how she does. Rx ordered.

## 2012-04-10 NOTE — Telephone Encounter (Signed)
i think trial of levsin .125 every 4 hours prn, disp 60, refil 3 is reasonable trial.  Thanks

## 2012-04-10 NOTE — Telephone Encounter (Signed)
Patient calling to report she had another attack of chest pain last night that lasted 35 minutes. She took Nitroglycerin and Librax. Her husband called EMS and they wanted to take her to the hospital but she did not go because the pain subsided.  She did have a cardiac cath 2 weeks ago and is on Ranexa.She reports these attacks are more frequent and she is wondering if there is another medication to try. She has an EGD scheduled for 05/19/12. Hx esophageal spasms. Patient saw Willette Cluster, NP on 03/30/12. Dr. Juanda Chance mentioned possible trial of Levsin Sl 0.125 every 4 hours prn and consider barium esophagram. Please, advise

## 2012-04-20 ENCOUNTER — Emergency Department (HOSPITAL_COMMUNITY): Payer: 59

## 2012-04-20 ENCOUNTER — Observation Stay (HOSPITAL_COMMUNITY)
Admission: EM | Admit: 2012-04-20 | Discharge: 2012-04-21 | Disposition: A | Discharge status: Home / Self Care | Payer: 59 | Attending: Internal Medicine | Admitting: Internal Medicine

## 2012-04-20 ENCOUNTER — Encounter (HOSPITAL_COMMUNITY): Payer: Self-pay

## 2012-04-20 DIAGNOSIS — I6350 Cerebral infarction due to unspecified occlusion or stenosis of unspecified cerebral artery: Secondary | ICD-10-CM

## 2012-04-20 DIAGNOSIS — R55 Syncope and collapse: Principal | ICD-10-CM | POA: Insufficient documentation

## 2012-04-20 DIAGNOSIS — I10 Essential (primary) hypertension: Secondary | ICD-10-CM

## 2012-04-20 DIAGNOSIS — R1013 Epigastric pain: Secondary | ICD-10-CM

## 2012-04-20 DIAGNOSIS — K589 Irritable bowel syndrome without diarrhea: Secondary | ICD-10-CM | POA: Insufficient documentation

## 2012-04-20 DIAGNOSIS — K594 Anal spasm: Secondary | ICD-10-CM

## 2012-04-20 DIAGNOSIS — K219 Gastro-esophageal reflux disease without esophagitis: Secondary | ICD-10-CM | POA: Insufficient documentation

## 2012-04-20 DIAGNOSIS — Z79899 Other long term (current) drug therapy: Secondary | ICD-10-CM | POA: Insufficient documentation

## 2012-04-20 DIAGNOSIS — R350 Frequency of micturition: Secondary | ICD-10-CM

## 2012-04-20 DIAGNOSIS — I25119 Atherosclerotic heart disease of native coronary artery with unspecified angina pectoris: Secondary | ICD-10-CM | POA: Diagnosis present

## 2012-04-20 DIAGNOSIS — K3189 Other diseases of stomach and duodenum: Secondary | ICD-10-CM

## 2012-04-20 DIAGNOSIS — Z7982 Long term (current) use of aspirin: Secondary | ICD-10-CM | POA: Insufficient documentation

## 2012-04-20 DIAGNOSIS — R079 Chest pain, unspecified: Secondary | ICD-10-CM | POA: Insufficient documentation

## 2012-04-20 DIAGNOSIS — Z8673 Personal history of transient ischemic attack (TIA), and cerebral infarction without residual deficits: Secondary | ICD-10-CM | POA: Insufficient documentation

## 2012-04-20 DIAGNOSIS — E785 Hyperlipidemia, unspecified: Secondary | ICD-10-CM | POA: Insufficient documentation

## 2012-04-20 DIAGNOSIS — Z7902 Long term (current) use of antithrombotics/antiplatelets: Secondary | ICD-10-CM | POA: Insufficient documentation

## 2012-04-20 DIAGNOSIS — I2510 Atherosclerotic heart disease of native coronary artery without angina pectoris: Secondary | ICD-10-CM

## 2012-04-20 LAB — CBC
HCT: 33.9 % — ABNORMAL LOW (ref 36.0–46.0)
Hemoglobin: 11.5 g/dL — ABNORMAL LOW (ref 12.0–15.0)
MCH: 30.8 pg (ref 26.0–34.0)
MCHC: 33.9 g/dL (ref 30.0–36.0)
MCV: 90.9 fL (ref 78.0–100.0)
Platelets: 220 10*3/uL (ref 150–400)
RBC: 3.73 MIL/uL — ABNORMAL LOW (ref 3.87–5.11)
RDW: 13.5 % (ref 11.5–15.5)
WBC: 4.5 10*3/uL (ref 4.0–10.5)

## 2012-04-20 LAB — TROPONIN I: Troponin I: <0.3 ng/mL (ref <0.30)

## 2012-04-20 LAB — URINALYSIS, ROUTINE W REFLEX MICROSCOPIC
Glucose, UA: NEGATIVE mg/dL
Hgb urine dipstick: NEGATIVE
Ketones, ur: NEGATIVE mg/dL
Leukocytes, UA: NEGATIVE
Nitrite: NEGATIVE
Protein, ur: NEGATIVE mg/dL
Specific Gravity, Urine: 1.024 (ref 1.005–1.030)
Urobilinogen, UA: 1 mg/dL (ref 0.0–1.0)
pH: 6 (ref 5.0–8.0)

## 2012-04-20 LAB — BASIC METABOLIC PANEL
BUN: 14 mg/dL (ref 6–23)
CO2: 29 mEq/L (ref 19–32)
Calcium: 8.8 mg/dL (ref 8.4–10.5)
Chloride: 99 mEq/L (ref 96–112)
Creatinine, Ser: 0.63 mg/dL (ref 0.50–1.10)
GFR calc Af Amer: >90 mL/min (ref >90)
GFR calc non Af Amer: >90 mL/min (ref >90)
Glucose, Bld: 107 mg/dL — ABNORMAL HIGH (ref 70–99)
Potassium: 3.5 mEq/L (ref 3.5–5.1)
Sodium: 137 mEq/L (ref 135–145)

## 2012-04-20 LAB — POCT I-STAT TROPONIN I: Troponin i, poc: 0 ng/mL (ref 0.00–0.08)

## 2012-04-20 MED ORDER — DILTIAZEM GEL 2 %: 1.0000 "application " | CUTANEOUS | Status: DC | PRN

## 2012-04-20 MED ORDER — HYOSCYAMINE SULFATE 0.125 MG SL SUBL
0.1250 mg | SUBLINGUAL_TABLET | SUBLINGUAL | Status: DC | PRN
  Filled 2012-04-20: qty 1

## 2012-04-20 MED ORDER — ONDANSETRON HCL 4 MG/2ML IJ SOLN: 4.0000 mg | Freq: Four times a day (QID) | INTRAMUSCULAR | Status: DC | PRN

## 2012-04-20 MED ORDER — VITAMIN D3 25 MCG (1000 UNIT) PO TABS
1000.0000 [IU] | ORAL_TABLET | Freq: Every day | ORAL | Status: DC
  Administered 2012-04-21: 1000 [IU] via ORAL
  Filled 2012-04-20: qty 1

## 2012-04-20 MED ORDER — ESTRADIOL 0.05 MG/24HR TD PTWK: 0.0500 mg | MEDICATED_PATCH | TRANSDERMAL | Status: DC

## 2012-04-20 MED ORDER — HYDROCODONE-ACETAMINOPHEN 5-325 MG PO TABS: 1.0000 | ORAL_TABLET | ORAL | Status: DC | PRN

## 2012-04-20 MED ORDER — FA-PYRIDOXINE-CYANOCOBALAMIN 2.5-25-2 MG PO TABS
1.0000 | ORAL_TABLET | Freq: Every day | ORAL | Status: DC
  Filled 2012-04-20: qty 1

## 2012-04-20 MED ORDER — TEMAZEPAM 15 MG PO CAPS
15.0000 mg | ORAL_CAPSULE | Freq: Every evening | ORAL | Status: DC | PRN
  Administered 2012-04-20: 15 mg via ORAL
  Filled 2012-04-20: qty 1

## 2012-04-20 MED ORDER — PANTOPRAZOLE SODIUM 40 MG PO TBEC: 40.0000 mg | DELAYED_RELEASE_TABLET | Freq: Every day | ORAL | Status: DC

## 2012-04-20 MED ORDER — ACETAMINOPHEN 325 MG PO TABS: 650.0000 mg | ORAL_TABLET | Freq: Four times a day (QID) | ORAL | Status: DC | PRN

## 2012-04-20 MED ORDER — CILIDINIUM-CHLORDIAZEPOXIDE 2.5-5 MG PO CAPS
1.0000 | ORAL_CAPSULE | Freq: Three times a day (TID) | ORAL | Status: DC
  Filled 2012-04-20 (×4): qty 1

## 2012-04-20 MED ORDER — SODIUM CHLORIDE 0.9 % IJ SOLN: 3.0000 mL | Freq: Two times a day (BID) | INTRAMUSCULAR | Status: DC

## 2012-04-20 MED ORDER — DIPHENHYDRAMINE HCL 25 MG PO TABS
25.0000 mg | ORAL_TABLET | Freq: Every evening | ORAL | Status: DC | PRN
  Filled 2012-04-20: qty 1

## 2012-04-20 MED ORDER — RANOLAZINE ER 500 MG PO TB12
1000.0000 mg | ORAL_TABLET | Freq: Every day | ORAL | Status: DC
  Administered 2012-04-21: 1000 mg via ORAL
  Filled 2012-04-20: qty 2

## 2012-04-20 MED ORDER — METAFOLBIC PLUS 6-2-600 MG PO TABS: 1.0000 | ORAL_TABLET | Freq: Every morning | ORAL | Status: DC

## 2012-04-20 MED ORDER — ASPIRIN 81 MG PO TABS: 81.0000 mg | ORAL_TABLET | Freq: Every day | ORAL | Status: DC

## 2012-04-20 MED ORDER — ASPIRIN EC 81 MG PO TBEC
81.0000 mg | DELAYED_RELEASE_TABLET | Freq: Every day | ORAL | Status: DC
  Filled 2012-04-20 (×2): qty 1

## 2012-04-20 MED ORDER — ACETAMINOPHEN 650 MG RE SUPP: 650.0000 mg | Freq: Four times a day (QID) | RECTAL | Status: DC | PRN

## 2012-04-20 MED ORDER — RANOLAZINE ER 500 MG PO TB12
500.0000 mg | ORAL_TABLET | Freq: Every day | ORAL | Status: DC
  Administered 2012-04-20: 500 mg via ORAL
  Filled 2012-04-20 (×2): qty 1

## 2012-04-20 MED ORDER — SODIUM CHLORIDE 0.9 % IJ SOLN: 3.0000 mL | INTRAMUSCULAR | Status: DC | PRN

## 2012-04-20 MED ORDER — ONDANSETRON HCL 4 MG PO TABS: 4.0000 mg | ORAL_TABLET | Freq: Four times a day (QID) | ORAL | Status: DC | PRN

## 2012-04-20 MED ORDER — RANOLAZINE ER 500 MG PO TB12: 500.0000 mg | ORAL_TABLET | Freq: Two times a day (BID) | ORAL | Status: DC

## 2012-04-20 MED ORDER — SODIUM CHLORIDE 0.9 % IV SOLN: 250.0000 mL | INTRAVENOUS | Status: DC | PRN

## 2012-04-20 MED ORDER — PANTOPRAZOLE SODIUM 40 MG PO TBEC
80.0000 mg | DELAYED_RELEASE_TABLET | Freq: Every day | ORAL | Status: DC
  Administered 2012-04-21: 80 mg via ORAL
  Filled 2012-04-20: qty 2

## 2012-04-20 MED ORDER — DICYCLOMINE HCL 10 MG PO CAPS
10.0000 mg | ORAL_CAPSULE | Freq: Three times a day (TID) | ORAL | Status: DC
  Filled 2012-04-20 (×4): qty 1

## 2012-04-20 MED ORDER — ALBUTEROL SULFATE (5 MG/ML) 0.5% IN NEBU: 2.5000 mg | INHALATION_SOLUTION | RESPIRATORY_TRACT | Status: DC | PRN

## 2012-04-20 MED ORDER — ONDANSETRON HCL 4 MG/2ML IJ SOLN: 4.0000 mg | Freq: Three times a day (TID) | INTRAMUSCULAR | Status: DC | PRN

## 2012-04-20 MED ORDER — ENOXAPARIN SODIUM 40 MG/0.4ML ~~LOC~~ SOLN
40.0000 mg | SUBCUTANEOUS | Status: DC
  Administered 2012-04-20: 40 mg via SUBCUTANEOUS
  Filled 2012-04-20 (×2): qty 0.4

## 2012-04-20 MED ORDER — ATORVASTATIN CALCIUM 20 MG PO TABS
20.0000 mg | ORAL_TABLET | Freq: Every day | ORAL | Status: DC
  Administered 2012-04-20: 20 mg via ORAL
  Filled 2012-04-20 (×2): qty 1

## 2012-04-20 MED ORDER — NITROGLYCERIN 0.4 MG SL SUBL: 0.4000 mg | SUBLINGUAL_TABLET | SUBLINGUAL | Status: DC | PRN

## 2012-04-20 MED ORDER — ACYCLOVIR 400 MG PO TABS
400.0000 mg | ORAL_TABLET | Freq: Two times a day (BID) | ORAL | Status: DC
  Administered 2012-04-21: 400 mg via ORAL
  Filled 2012-04-20 (×3): qty 1

## 2012-04-20 NOTE — ED Provider Notes (Signed)
History     CSN: [id number]  Arrival date & time 04/20/12  1443   First MD Initiated Contact with Patient 04/20/12 1538      Chief Complaint  Patient presents with  . Chest Pain    (Consider location/radiation/quality/duration/timing/severity/associated sxs/prior treatment) HPI Comments: Was at [employer] shopping, had a gradual onset substernal tight chest pain that radiated down her left shoulder. This lasted approximately 15 minutes and then she had a syncopal episode. After coming out of her episode she stated that her pain went away. She now feels normal and her pain is resolved. She has had similar episodes of pain from her esophageal spasms and angina pectoris per patient, but she has never had a syncopal event like this. Of note, she exercised on her eliptical this morning and felt fine.  Patient is a 61 y.o. female presenting with chest pain. The history is provided by the patient. No language interpreter was used.  Chest Pain The chest pain began 3 - 5 hours ago. Duration of episode(s) is 15 minutes. Chest pain occurs rarely. The chest pain is resolved. At its most intense, the pain is at 6/10. The pain is currently at 0/10. The severity of the pain is moderate. The quality of the pain is described as tightness. The pain radiates to the left shoulder. Primary symptoms include syncope and palpitations (felt fast hr). Pertinent negatives for primary symptoms include no fever, no shortness of breath, no cough, no wheezing, no abdominal pain, no nausea, no vomiting and no dizziness.  There was loss of consciousness. The episode was witnessed. The duration of the episode was 1 minute. There was no visual change, dizziness, weakness or nausea. Sweating: clammy. The episode was precipitated by pain. The episode occurred at rest. The syncopal episode occurred with palpitations (felt fast hr). The syncopal episode did not occur with shortness of breath or headaches. There was no post-event  confusion. There was no urinary incontinence with syncope.   The palpitations also occurred with syncope. The palpitations did not occur with dizziness or shortness of breath.   Pertinent negatives for associated symptoms include no claudication, no diaphoresis, no lower extremity edema and no weakness. She tried nitroglycerin for the symptoms.  Her past medical history is significant for CAD and hypertension.  Procedure history is positive for cardiac catheterization.     Past Medical History  Diagnosis Date  . Coronary artery disease   . GERD (gastroesophageal reflux disease)   . IBS (irritable bowel syndrome)     Past Surgical History  Procedure Date  . Back surgery   . Abdominal hysterectomy   . Breast biopsy   . Ovarian cyst removal     No family history on file.  History  Substance Use Topics  . Smoking status: Never Smoker   . Smokeless tobacco: Never Used  . Alcohol Use: Yes     Comment: occasional    OB History    Grav Para Term Preterm Abortions TAB SAB Ect Mult Living                  Review of Systems  Constitutional: Negative for fever, chills, diaphoresis, activity change and appetite change.  HENT: Negative for congestion, rhinorrhea, neck pain, neck stiffness and sinus pressure.   Eyes: Negative for discharge and visual disturbance.  Respiratory: Negative for cough, chest tightness, shortness of breath, wheezing and stridor.   Cardiovascular: Positive for chest pain, palpitations (felt fast hr) and syncope. Negative for claudication and leg  swelling.  Gastrointestinal: Negative for nausea, vomiting, abdominal pain, diarrhea and abdominal distention.  Genitourinary: Negative for decreased urine volume and difficulty urinating.  Musculoskeletal: Negative for back pain and arthralgias.  Skin: Negative for color change and pallor.  Neurological: Positive for loss of consciousness, syncope and light-headedness. Negative for dizziness, weakness and  headaches.  Psychiatric/Behavioral: Negative for behavioral problems and agitation.  All other systems reviewed and are negative.    Allergies  Clindamycin/lincomycin; Doxycycline; Escitalopram oxalate; Hydrocodone; Macrodantin; and Trazodone and nefazodone  Home Medications   Current Outpatient Rx  Name  Route  Sig  Dispense  Refill  . ACYCLOVIR 400 MG PO TABS   Oral   Take 400 mg by mouth 2 (two) times daily.         . ASPIRIN 81 MG PO TABS   Oral   Take 81 mg by mouth daily.         . ATORVASTATIN CALCIUM 20 MG PO TABS   Oral   Take 20 mg by mouth daily.         Marland Kitchen VITAMIN D 1000 UNITS PO TABS   Oral   Take 1,000 Units by mouth daily.         Marland Kitchen CLINDINIUM-CHLORDIAZEPOXIDE 2.5-5 MG PO CAPS   Oral   Take 1 capsule by mouth 3 (three) times daily as needed. For anxiety         . DICYCLOMINE HCL 10 MG PO CAPS   Oral   Take 10 mg by mouth 3 (three) times daily as needed. For IBS         . DILTIAZEM GEL 2 %   Topical   Apply 1 application topically as needed. For rectal spasm         . DIPHENHYDRAMINE HCL 25 MG PO TABS   Oral   Take 25 mg by mouth at bedtime as needed. Takes for 2 nights, then takes Temazepam for one night, then repeats         . ESOMEPRAZOLE MAGNESIUM 40 MG PO CPDR   Oral   Take 40 mg by mouth daily before breakfast.         . ESTRADIOL 0.05 MG/24HR TD PTWK   Transdermal   Place 1 patch onto the skin once a week.         Marland Kitchen HYOSCYAMINE SULFATE 0.125 MG SL SUBL   Sublingual   Place 0.125 mg under the tongue every 4 (four) hours as needed. For cramping         . METAFOLBIC PLUS 6-2-600 MG PO TABS   Oral   Take 1 tablet by mouth Daily.         . MULTIVITAMIN PO   Oral   Take 1 tablet by mouth daily.         Marland Kitchen NITROGLYCERIN 0.4 MG SL SUBL   Sublingual   Place 0.4 mg under the tongue every 5 (five) minutes as needed. For chest pain         . OXYCODONE-ACETAMINOPHEN 5-325 MG PO TABS   Oral   Take 1 tablet by  mouth every 4 (four) hours as needed. For pain         . RANOLAZINE ER 500 MG PO TB12   Oral   Take 500-1,000 mg by mouth 2 (two) times daily. Take 2 tablets in AM, 1 tablet in PMa         . TEMAZEPAM 15 MG PO CAPS   Oral   Take 1  capsule by mouth At bedtime as needed. For sleep           BP 125/65  Temp 98.2 F (36.8 C) (Oral)  Resp 18  SpO2 100%  Physical Exam  Nursing note and vitals reviewed. Constitutional: She is oriented to person, place, and time. She appears well-developed and well-nourished. No distress.  HENT:  Head: Normocephalic and atraumatic.  Mouth/Throat: No oropharyngeal exudate.  Eyes: EOM are normal. Pupils are equal, round, and reactive to light. Right eye exhibits no discharge. Left eye exhibits no discharge.  Neck: Normal range of motion. Neck supple. No JVD present.  Cardiovascular: Normal rate, regular rhythm and normal heart sounds.   Pulmonary/Chest: Effort normal and breath sounds normal. No stridor. No respiratory distress. She exhibits no tenderness.  Abdominal: Soft. Bowel sounds are normal. She exhibits no distension. There is no tenderness. There is no guarding.  Musculoskeletal: Normal range of motion. She exhibits no edema and no tenderness.  Neurological: She is alert and oriented to person, place, and time. No cranial nerve deficit. She exhibits normal muscle tone.  Skin: Skin is warm and dry. No rash noted. She is not diaphoretic.  Psychiatric: She has a normal mood and affect. Her behavior is normal. Judgment and thought content normal.    ED Course  Procedures (including critical care time)  Labs Reviewed  CBC - Abnormal; Notable for the following:    RBC 3.73 (*)     Hemoglobin 11.5 (*)     HCT 33.9 (*)     All other components within normal limits  BASIC METABOLIC PANEL - Abnormal; Notable for the following:    Glucose, Bld 107 (*)     All other components within normal limits  URINALYSIS, ROUTINE W REFLEX MICROSCOPIC -  Abnormal; Notable for the following:    Color, Urine AMBER (*)  BIOCHEMICALS MAY BE AFFECTED BY COLOR   Bilirubin Urine SMALL (*)     All other components within normal limits  POCT I-STAT TROPONIN I  TROPONIN I  CBC  BASIC METABOLIC PANEL  TROPONIN I  TROPONIN I   Dg Chest 2 View  04/20/2012  *RADIOLOGY REPORT*  Clinical Data: Left-sided chest pain  CHEST - 2 VIEW  Comparison: 03/24/2012  Findings: The heart and pulmonary vascularity are within normal limits.  The lungs are mildly hyperinflated.  No focal infiltrate is seen.  No osseous abnormality is noted.  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   Original Report Authenticated By: Alcide Clever, M.D.      1. Syncope   2. Chest pain   3. Coronary atherosclerosis of unspecified type of vessel, native or graft   4. Dyspepsia and other specified disorders of function of stomach    MDM  4:32 PM sxs typical for cardiac/angina but also w/ hx esoph spasm. She did have a syncopal episode which may be vasovagal but also considered other causes incl acs, pe, orthstatic hypotn. She had no SOB and nml VS, PE unlikely. She had 10-75min pain prior to this episode which makes an acute sudden arrhythmia less likely and vasovagal more likely.  Spoke w/ cards, they recommend admission and obs on tele. Pt admitted in stable condition       Warrick Parisian, MD 04/20/12 2355

## 2012-04-20 NOTE — ED Provider Notes (Signed)
61 year old female was at [facility] and trying on shoes when she developed some chest pain for which he took several nitroglycerin. She then started feeling lightheaded and felt like her heart was racing and pounding. She developed nausea and had syncope. She does not remember being sweaty but and nursing was on the scene said that her hands were clammy. She's not sure how long she was unconscious for, but she pushes back to normal within about 20 minutes. She had a recent heart catheterization which showed no blockages greater than 50%. On exam, lungs are clear and heart has regular rate and rhythm. Episode may have been vasovagal and associated nausea and diaphoresis with go along with that. However, you would not expect tachycardia with a vasovagal episode. It's also possible that she had hypotension from taking nitroglycerin doses too close together. However, she also may have had a primary arrhythmia. Because of this, she will need to be admitted for observation and ongoing cardiac monitoring.  ECG shows normal sinus rhythm with a rate of 70, no ectopy. Normal axis. Normal P wave. Normal QRS. Normal intervals. Normal ST and T waves. Impression: nomal ECG. When compared with ECG of 03/26/2012, no significant changes are seen.  I saw and evaluated the patient, reviewed the resident's note and I agree with the findings and plan.   Dione Booze, MD 04/20/12 [id number]

## 2012-04-20 NOTE — H&P (Signed)
PATIENT DETAILS Name: [name] Age: 61 y.o. Sex: female Date of Birth: Mar 16, 1951 Admit Date: 04/20/2012 ZOX:[id number], MD   CHIEF COMPLAINT:  Syncope  HPI: Patient is a 61 year old Caucasian female with a past medical history of nonobstructive coronary artery disease, prior history of TIA, dyslipidemia, interstitial cystitis, irritable bowel syndrome who presents to the hospital for evaluation of the above noted complaints. Per patient she was in her usual state of health, was shopping at [facility] when she started having left-sided chest pain radiating to the left arm. This kept on recurring numerous times and she took at least 3 tablets of night sublingual nitroglycerin. However she started feeling faint and subsequently experienced a syncopal episode. She was then brought to the emergency room for further evaluation, I was then subsequently called to admit this patient. She denies any tongue bite or urinary incontinence. Per ED M.D. this was a witnessed syncope without any seizure like activity. During my evaluation the patient is chest pain-free, does not have any shortness of breath. She also denies any nausea or vomiting. She does claim that just prior to her passing out she did have some palpitations. She denies any fever, denies any dysuria or abdominal pain or diarrhea.   ALLERGIES:   Allergies  Allergen Reactions  . Clindamycin/Lincomycin Other (See Comments)    Reaction unknown  . Doxycycline Nausea Only  . Escitalopram Oxalate Nausea Only  . Hydrocodone Itching  . Macrodantin Other (See Comments)    Flushed, hot  . Trazodone And Nefazodone Nausea Only    PAST MEDICAL HISTORY: Past Medical History  Diagnosis Date  . Coronary artery disease   . GERD (gastroesophageal reflux disease)   . IBS (irritable bowel syndrome)     PAST SURGICAL HISTORY: Past Surgical History  Procedure Date  . Back surgery   . Abdominal hysterectomy   . Breast biopsy   .  Ovarian cyst removal     MEDICATIONS AT HOME: Prior to Admission medications   Medication Sig Start Date End Date Taking? Authorizing Provider  acyclovir (ZOVIRAX) 400 MG tablet Take 400 mg by mouth 2 (two) times daily.   Yes Historical Provider, MD  aspirin 81 MG tablet Take 81 mg by mouth daily.   Yes Historical Provider, MD  atorvastatin (LIPITOR) 20 MG tablet Take 20 mg by mouth daily.   Yes Historical Provider, MD  cholecalciferol (VITAMIN D) 1000 UNITS tablet Take 1,000 Units by mouth daily.   Yes Historical Provider, MD  clidinium-chlordiazePOXIDE (LIBRAX) 2.5-5 MG per capsule Take 1 capsule by mouth 3 (three) times daily as needed. For anxiety   Yes Historical Provider, MD  dicyclomine (BENTYL) 10 MG capsule Take 10 mg by mouth 3 (three) times daily as needed. For IBS   Yes Historical Provider, MD  diltiazem 2 % GEL Apply 1 application topically as needed. For rectal spasm   Yes Historical Provider, MD  diphenhydrAMINE (BENADRYL) 25 MG tablet Take 25 mg by mouth at bedtime as needed. Takes for 2 nights, then takes Temazepam for one night, then repeats   Yes Historical Provider, MD  esomeprazole (NEXIUM) 40 MG capsule Take 40 mg by mouth daily before breakfast.   Yes Historical Provider, MD  estradiol (CLIMARA - DOSED IN MG/24 HR) 0.05 mg/24hr Place 1 patch onto the skin once a week.   Yes Historical Provider, MD  hyoscyamine (LEVSIN SL) 0.125 MG SL tablet Place 0.125 mg under the tongue every 4 (four) hours as needed. For cramping 04/10/12  Yes Reuel Boom  Marye Round, MD  Methylfol-Methylcob-Acetylcyst (METAFOLBIC PLUS) 6-2-600 MG TABS Take 1 tablet by mouth Daily. 03/11/12  Yes Historical Provider, MD  Multiple Vitamins-Minerals (MULTIVITAMIN PO) Take 1 tablet by mouth daily.   Yes Historical Provider, MD  nitroGLYCERIN (NITROSTAT) 0.4 MG SL tablet Place 0.4 mg under the tongue every 5 (five) minutes as needed. For chest pain   Yes Historical Provider, MD  oxyCODONE-acetaminophen  (PERCOCET/ROXICET) 5-325 MG per tablet Take 1 tablet by mouth every 4 (four) hours as needed. For pain   Yes Historical Provider, MD  ranolazine (RANEXA) 500 MG 12 hr tablet Take 500-1,000 mg by mouth 2 (two) times daily. Take 2 tablets in AM, 1 tablet in PMa   Yes Historical Provider, MD  temazepam (RESTORIL) 15 MG capsule Take 1 capsule by mouth At bedtime as needed. For sleep 02/11/12  Yes Historical Provider, MD    FAMILY HISTORY: No family history on file.  SOCIAL HISTORY:  reports that she has never smoked. She has never used smokeless tobacco. She reports that she drinks alcohol. She reports that she does not use illicit drugs.  REVIEW OF SYSTEMS:  Constitutional:   No  weight loss, night sweats,  Fevers, chills, fatigue.  HEENT:    No headaches, Difficulty swallowing,Tooth/dental problems,Sore throat,  No sneezing, itching, ear ache, nasal congestion, post nasal drip,   Cardio-vascular: Orthopnea, PND, swelling in lower extremities, anasarca, dizziness, palpitations  GI:  No heartburn, indigestion, abdominal pain, nausea, vomiting, diarrhea, change in bowel habits, loss of appetite  Resp: No shortness of breath with exertion or at rest.  No excess mucus, no productive cough, No non-productive cough,  No coughing up of blood.No change in color of mucus.No wheezing.No chest wall deformity  Skin:  no rash or lesions.  GU:  no dysuria, change in color of urine, no urgency or frequency.  No flank pain.  Musculoskeletal: No joint pain or swelling.  No decreased range of motion.  No back pain.  Psych: No change in mood or affect. No depression or anxiety.  No memory loss.   PHYSICAL EXAM: Blood pressure 147/84, pulse 83, temperature 98.2 F (36.8 C), temperature source Oral, resp. rate 19, SpO2 100.00%.  General appearance :Awake, alert, not in any distress. Speech Clear. Not toxic Looking HEENT: Atraumatic and Normocephalic, pupils equally reactive to light and  accomodation Neck: supple, no JVD. No cervical lymphadenopathy.  Chest:Good air entry bilaterally, no added sounds  CVS: S1 S2 regular, no murmurs.  Abdomen: Bowel sounds present, Non tender and not distended with no gaurding, rigidity or rebound. Extremities: B/L Lower Ext shows no edema, both legs are warm to touch, with  dorsalis pedis pulses palpable. Neurology: Awake alert, and oriented X 3, CN II-XII intact,  Non focalWounds:N/A  LABS ON ADMISSION:   Basename 04/20/12 1527  NA 137  K 3.5  CL 99  CO2 29  GLUCOSE 107*  BUN 14  CREATININE 0.63  CALCIUM 8.8  MG --  PHOS --   No results found for this basename: AST:2,ALT:2,ALKPHOS:2,BILITOT:2,PROT:2,ALBUMIN:2 in the last 72 hours No results found for this basename: LIPASE:2,AMYLASE:2 in the last 72 hours  Basename 04/20/12 1527  WBC 4.5  NEUTROABS --  HGB 11.5*  HCT 33.9*  MCV 90.9  PLT 220   No results found for this basename: CKTOTAL:3,CKMB:3,CKMBINDEX:3,TROPONINI:3 in the last 72 hours No results found for this basename: DDIMER:2 in the last 72 hours No components found with this basename: POCBNP:3   RADIOLOGIC STUDIES ON ADMISSION: Dg Chest  2 View  04/20/2012  *RADIOLOGY REPORT*  Clinical Data: Left-sided chest pain  CHEST - 2 VIEW  Comparison: 03/24/2012  Findings: The heart and pulmonary vascularity are within normal limits.  The lungs are mildly hyperinflated.  No focal infiltrate is seen.  No osseous abnormality is noted.  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   Original Report Authenticated By: Alcide Clever, M.D.     ASSESSMENT AND PLAN: Present on Admission:  . Syncope -Etiology not in any apparent, suspicious for vasovagal syncope  -She will be admitted to a telemetry unit and monitored closely.  -2 D Echo will be ordered  . Chest pain -does have known CAD -last LHC done on 03/26/12-apparently does have a h/o stable angina and is on nitrates -will cycle ardiac enzymes -EDP d/w [facility]  cardiology and above recommended.  . CORONARY ARTERY DISEASE -c/w ASA, statins  . Irritable bowel syndrome -stable -c/w home meds  . HYPERLIPIDEMIA -c/w statins  . GERD -c/w PPI  . HYPERTENSION -not on any anti-hypertensive meds-BP currently controlled, monitor  Further plan will depend as patient's clinical course evolves and further radiologic and laboratory data become available. Patient will be monitored closely.  DVT Prophylaxis: Prophylactic Lovenox  Code Status: Full Code  Total time spent for admission equals 45 minutes.  [facility] Pager [phone]  If 7PM-7AM, please contact night-coverage [email or web address] Password [facility] 04/20/2012, 6:03 PM

## 2012-04-20 NOTE — ED Notes (Signed)
Patient presents with sub sternal chest tightness today with radiation down left arm while walking in the store today.  Patient had 3 nitro without relief, EMS gave 324 ASA.  Patient reporting no pain at this time.

## 2012-04-20 NOTE — ED Notes (Signed)
Attempted to get ECG several times without success.  Attempting to start again.

## 2012-04-21 DIAGNOSIS — I10 Essential (primary) hypertension: Secondary | ICD-10-CM

## 2012-04-21 DIAGNOSIS — I6350 Cerebral infarction due to unspecified occlusion or stenosis of unspecified cerebral artery: Secondary | ICD-10-CM

## 2012-04-21 DIAGNOSIS — E785 Hyperlipidemia, unspecified: Secondary | ICD-10-CM

## 2012-04-21 HISTORY — PX: TRANSTHORACIC ECHOCARDIOGRAM: SHX275

## 2012-04-21 LAB — CBC
HCT: 35.2 % — ABNORMAL LOW (ref 36.0–46.0)
Hemoglobin: 11.8 g/dL — ABNORMAL LOW (ref 12.0–15.0)
MCH: 29.9 pg (ref 26.0–34.0)
MCHC: 33.5 g/dL (ref 30.0–36.0)
MCV: 89.3 fL (ref 78.0–100.0)
Platelets: 238 10*3/uL (ref 150–400)
RBC: 3.94 MIL/uL (ref 3.87–5.11)
RDW: 13.3 % (ref 11.5–15.5)
WBC: 5.6 10*3/uL (ref 4.0–10.5)

## 2012-04-21 LAB — BASIC METABOLIC PANEL
BUN: 11 mg/dL (ref 6–23)
CO2: 27 mEq/L (ref 19–32)
Calcium: 9.1 mg/dL (ref 8.4–10.5)
Chloride: 104 mEq/L (ref 96–112)
Creatinine, Ser: 0.63 mg/dL (ref 0.50–1.10)
GFR calc Af Amer: >90 mL/min (ref >90)
GFR calc non Af Amer: >90 mL/min (ref >90)
Glucose, Bld: 99 mg/dL (ref 70–99)
Potassium: 3.5 mEq/L (ref 3.5–5.1)
Sodium: 140 mEq/L (ref 135–145)

## 2012-04-21 LAB — TROPONIN I
Troponin I: <0.3 ng/mL (ref <0.30)
Troponin I: <0.3 ng/mL (ref <0.30)

## 2012-04-21 NOTE — Progress Notes (Signed)
  Echocardiogram 2D Echocardiogram has been performed.  Verlene Glantz 04/21/2012, 11:03 AM

## 2012-04-21 NOTE — Progress Notes (Signed)
Pt d/c'd to private vehicle. Assessment unchanged from this am.

## 2012-04-21 NOTE — Discharge Summary (Signed)
Physician Discharge Summary  Patient ID: LEEAN AMEZCUA MRN: [id number] DOB/AGE: 1950/12/17 61 y.o.  Admit date: 04/20/2012 Discharge date: 04/21/2012  Primary Care Physician:  Elby Showers, MD  Discharge Diagnoses:    . Syncope likely vasovagal  . Irritable bowel syndrome . Chest pain . CORONARY ARTERY DISEASE . HYPERLIPIDEMIA . GERD . HYPERTENSION  Consults: None  Discharge Medications:   Medication List     As of 04/21/2012  4:03 PM    TAKE these medications         acyclovir 400 MG tablet   Commonly known as: ZOVIRAX   Take 400 mg by mouth 2 (two) times daily.      aspirin 81 MG tablet   Take 81 mg by mouth daily.      atorvastatin 20 MG tablet   Commonly known as: LIPITOR   Take 20 mg by mouth daily.      cholecalciferol 1000 UNITS tablet   Commonly known as: VITAMIN D   Take 1,000 Units by mouth daily.      clidinium-chlordiazePOXIDE 2.5-5 MG per capsule   Commonly known as: LIBRAX   Take 1 capsule by mouth 3 (three) times daily as needed. For anxiety      dicyclomine 10 MG capsule   Commonly known as: BENTYL   Take 10 mg by mouth 3 (three) times daily as needed. For IBS      diltiazem 2 % Gel   Apply 1 application topically as needed. For rectal spasm      diphenhydrAMINE 25 MG tablet   Commonly known as: BENADRYL   Take 25 mg by mouth at bedtime as needed. Takes for 2 nights, then takes Temazepam for one night, then repeats      esomeprazole 40 MG capsule   Commonly known as: NEXIUM   Take 40 mg by mouth daily before breakfast.      estradiol 0.05 mg/24hr   Commonly known as: CLIMARA - Dosed in mg/24 hr   Place 1 patch onto the skin once a week.      hyoscyamine 0.125 MG SL tablet   Commonly known as: LEVSIN SL   Place 0.125 mg under the tongue every 4 (four) hours as needed. For cramping      METAFOLBIC PLUS 6-2-600 MG Tabs   Take 1 tablet by mouth Daily.      MULTIVITAMIN PO   Take 1 tablet by mouth daily.      nitroGLYCERIN  0.4 MG SL tablet   Commonly known as: NITROSTAT   Place 0.4 mg under the tongue every 5 (five) minutes as needed. For chest pain      oxyCODONE-acetaminophen 5-325 MG per tablet   Commonly known as: PERCOCET/ROXICET   Take 1 tablet by mouth every 4 (four) hours as needed. For pain      ranolazine 500 MG 12 hr tablet   Commonly known as: RANEXA   Take 500-1,000 mg by mouth 2 (two) times daily. Take 2 tablets in AM, 1 tablet in PMa      temazepam 15 MG capsule   Commonly known as: RESTORIL   Take 1 capsule by mouth At bedtime as needed. For sleep         Brief H and P: For complete details please refer to admission H and P, but in brief, Patient is a 61 year old Caucasian female with a past medical history of nonobstructive coronary artery disease, prior history of TIA, dyslipidemia, interstitial cystitis, irritable bowel syndrome who presented with  syncopal episode. Per patient she was in her usual state of health, was shopping at [facility] when she started having left-sided chest pain radiating to the left arm. This kept on recurring numerous times and she took at least 3 tablets of night sublingual nitroglycerin. However she started feeling faint and subsequently experienced a syncopal episode. She was then brought to the emergency room for further evaluation.   Hospital Course:   Syncope: Etiology not in any apparent, suspicious for vasovagal syncope, patient was admitted to telemetry, she had no arrhythmias, she was ruled out for acute ACS with negative troponins. 2-D echo was ordered which showed EF of 60-65% no regional wall motion abnormalities, grade 1 diastolic dysfunction.  . Chest pain : Completely resolved, patient has a known CAD. She had left heart catheterization done on 03/26/12-apparently does have a h/o stable angina and is on nitrates. Per Dr Michaelle Copas report on 03/26/12, she had a nuclear study done within the past 2 months and did not reveal ischemia.      . CORONARY  ARTERY DISEASE  -Continue with all the cardiac medications and follow up with [facility] cardiology in 2 weeks.   Day of Discharge BP 133/77  Pulse 72  Temp 98.7 F (37.1 C) (Oral)  Resp 20  Ht 5\' 2"  (1.575 m)  Wt 56.019 kg (123 lb 8 oz)  BMI 22.59 kg/m2  SpO2 99%  Physical Exam: General: Alert and awake oriented x3 not in any acute distress. HEENT: anicteric sclera, pupils reactive to light and accommodation CVS: S1-S2 clear no murmur rubs or gallops Chest: clear to auscultation bilaterally, no wheezing rales or rhonchi Abdomen: soft nontender, nondistended, normal bowel sounds, no organomegaly Extremities: no cyanosis, clubbing or edema noted bilaterally Neuro: Cranial nerves II-XII intact, no focal neurological deficits   The results of significant diagnostics from this hospitalization (including imaging, microbiology, ancillary and laboratory) are listed below for reference.    LAB RESULTS: Basic Metabolic Panel:  Lab 04/21/12 1610 04/20/12 1527  NA 140 137  K 3.5 3.5  CL 104 99  CO2 27 29  GLUCOSE 99 107*  BUN 11 14  CREATININE 0.63 0.63  CALCIUM 9.1 8.8  MG -- --  PHOS -- --   CBC:  Lab 04/21/12 0309 04/20/12 1527  WBC 5.6 4.5  NEUTROABS -- --  HGB 11.8* 11.5*  HCT 35.2* 33.9*  MCV 89.3 --  PLT 238 220   Cardiac Enzymes:  Lab 04/21/12 0852 04/21/12 0309  CKTOTAL -- --  CKMB -- --  CKMBINDEX -- --  TROPONINI <0.30 <0.30   BNP: No components found with this basename: POCBNP:2 CBG: No results found for this basename: GLUCAP:2 in the last 168 hours  Significant Diagnostic Studies:  Dg Chest 2 View  04/20/2012  *RADIOLOGY REPORT*  Clinical Data: Left-sided chest pain  CHEST - 2 VIEW  Comparison: 03/24/2012  Findings: The heart and pulmonary vascularity are within normal limits.  The lungs are mildly hyperinflated.  No focal infiltrate is seen.  No osseous abnormality is noted.  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   Original Report  Authenticated By: Alcide Clever, M.D.      Disposition and Follow-up:     Discharge Orders    Future Appointments: Provider: Department: Dept Phone: Center:   05/19/2012 4:00 PM Hart Carwin, MD [facility] [phone] [id number]     Future Orders Please Complete By Expires   Diet - low sodium heart healthy      Increase  activity slowly          DISPOSITION: home   DIET: heart healthy diet  ACTIVITY: as tolerated   DISCHARGE FOLLOW-UP Follow-up Information    Follow up with Elby Showers, MD. Schedule an appointment as soon as possible for a visit in 10 days. (for hospital follow-up)    Contact information:   [address] [place] [address] [place] [address] [phone]          Time spent on Discharge: 35 mins  Signed:   RAI,RIPUDEEP M.D. [facility] 04/21/2012, 4:03 PM Pager: [phone]

## 2012-04-22 NOTE — Progress Notes (Signed)
Utilization review completed.  

## 2012-04-27 ENCOUNTER — Telehealth: Payer: Self-pay | Admitting: Internal Medicine

## 2012-04-27 DIAGNOSIS — R0789 Other chest pain: Secondary | ICD-10-CM

## 2012-04-27 NOTE — Telephone Encounter (Signed)
Patient is asking if she can have her "gall bladder checked" prior to her EGD. She is still having chest pain and left arm pain. She has had her heart checked out and it is not heart related. She has met her deductible and would like to have it done before prior to the new year. Please, advise.

## 2012-04-27 NOTE — Telephone Encounter (Signed)
Left a message for patient to call me. 

## 2012-04-27 NOTE — Telephone Encounter (Signed)
OK to schedule sono of the gall bladder. She had a normal sono in 2006 and 2008. She also had a normal HIDA scan in 2008

## 2012-04-28 ENCOUNTER — Emergency Department (HOSPITAL_COMMUNITY): Payer: 59

## 2012-04-28 ENCOUNTER — Telehealth: Payer: Self-pay | Admitting: Gastroenterology

## 2012-04-28 ENCOUNTER — Emergency Department (HOSPITAL_COMMUNITY)
Admission: EM | Admit: 2012-04-28 | Discharge: 2012-04-29 | Disposition: A | Discharge status: Home Health | Payer: 59 | Attending: Emergency Medicine | Admitting: Emergency Medicine

## 2012-04-28 ENCOUNTER — Encounter (HOSPITAL_COMMUNITY): Payer: Self-pay | Admitting: Emergency Medicine

## 2012-04-28 DIAGNOSIS — L509 Urticaria, unspecified: Secondary | ICD-10-CM | POA: Insufficient documentation

## 2012-04-28 DIAGNOSIS — Z7982 Long term (current) use of aspirin: Secondary | ICD-10-CM | POA: Insufficient documentation

## 2012-04-28 DIAGNOSIS — K760 Fatty (change of) liver, not elsewhere classified: Secondary | ICD-10-CM

## 2012-04-28 DIAGNOSIS — I2510 Atherosclerotic heart disease of native coronary artery without angina pectoris: Secondary | ICD-10-CM | POA: Insufficient documentation

## 2012-04-28 DIAGNOSIS — K219 Gastro-esophageal reflux disease without esophagitis: Secondary | ICD-10-CM | POA: Insufficient documentation

## 2012-04-28 DIAGNOSIS — R1013 Epigastric pain: Secondary | ICD-10-CM | POA: Insufficient documentation

## 2012-04-28 DIAGNOSIS — Z79899 Other long term (current) drug therapy: Secondary | ICD-10-CM | POA: Insufficient documentation

## 2012-04-28 DIAGNOSIS — R109 Unspecified abdominal pain: Secondary | ICD-10-CM

## 2012-04-28 DIAGNOSIS — G8929 Other chronic pain: Secondary | ICD-10-CM | POA: Insufficient documentation

## 2012-04-28 DIAGNOSIS — K589 Irritable bowel syndrome without diarrhea: Secondary | ICD-10-CM | POA: Insufficient documentation

## 2012-04-28 HISTORY — DX: Fatty (change of) liver, not elsewhere classified: K76.0

## 2012-04-28 LAB — URINALYSIS, ROUTINE W REFLEX MICROSCOPIC
Bilirubin Urine: NEGATIVE
Glucose, UA: NEGATIVE mg/dL
Hgb urine dipstick: NEGATIVE
Ketones, ur: NEGATIVE mg/dL
Leukocytes, UA: NEGATIVE
Nitrite: NEGATIVE
Protein, ur: NEGATIVE mg/dL
Specific Gravity, Urine: 1.009 (ref 1.005–1.030)
Urobilinogen, UA: 0.2 mg/dL (ref 0.0–1.0)
pH: 8 (ref 5.0–8.0)

## 2012-04-28 LAB — CBC
HCT: 36.9 % (ref 36.0–46.0)
Hemoglobin: 12.8 g/dL (ref 12.0–15.0)
MCH: 30.3 pg (ref 26.0–34.0)
MCHC: 34.7 g/dL (ref 30.0–36.0)
MCV: 87.2 fL (ref 78.0–100.0)
Platelets: 247 10*3/uL (ref 150–400)
RBC: 4.23 MIL/uL (ref 3.87–5.11)
RDW: 13 % (ref 11.5–15.5)
WBC: 6.9 10*3/uL (ref 4.0–10.5)

## 2012-04-28 MED ORDER — DIPHENHYDRAMINE HCL 50 MG/ML IJ SOLN
25.0000 mg | Freq: Once | INTRAMUSCULAR | Status: AC
  Administered 2012-04-28: via INTRAVENOUS
  Filled 2012-04-28: qty 1

## 2012-04-28 MED ORDER — SODIUM CHLORIDE 0.9 % IV BOLUS (SEPSIS)
1000.0000 mL | Freq: Once | INTRAVENOUS | Status: AC
  Administered 2012-04-28: 1000 mL via INTRAVENOUS

## 2012-04-28 MED ORDER — FAMOTIDINE IN NACL 20-0.9 MG/50ML-% IV SOLN
20.0000 mg | Freq: Once | INTRAVENOUS | Status: AC
  Administered 2012-04-28: 20 mg via INTRAVENOUS
  Filled 2012-04-28: qty 50

## 2012-04-28 NOTE — ED Notes (Signed)
Pt denies chest pain; denies nausea; denies dizziness and weakness.

## 2012-04-28 NOTE — Telephone Encounter (Signed)
Scheduled patient at [facility] radiology for [place] on 04/29/12 7:45/8:00 AM. NPO after midnight. Patient notified of appointment date/time and instructions.

## 2012-04-28 NOTE — Telephone Encounter (Signed)
On call note at 2240. Pt with chest pain similar to recent episodes but more severe. She has an abd [place] scheduled for tomorrow and EGD in Dec. Reviewed her last office visit with Willette Cluster. Recent cardiac evaluation was negative. I am not sure what is causing her symptoms. Advised that she go to ED tonight but she does not want to do that. Advised her to stay on clear liquids tonight, try NTG, try Maalox, try Librax, and if those are not effective try oxycodone. If all those not helpful she is strongly advised to ED for evaluation.

## 2012-04-28 NOTE — ED Notes (Signed)
Per EMS: upper epigastric pain "spasms" intermittent. Tenderness and guarding. No palpable mass noted. Sharp pain radiates to back. Some spasms worse than others. Pt is from home. Pt has hx of GI issues (esophageal spasms) this pain feels different. Pt suppose to see doctor tomorrow about gallbladder. BP 152/72 HR 72. 20 L AC.

## 2012-04-28 NOTE — ED Notes (Signed)
Pt states pain appears to be the same, but "moves around" today.

## 2012-04-28 NOTE — ED Notes (Addendum)
[place], PA notified about possible hives, redness of skin and pt itching.

## 2012-04-28 NOTE — ED Notes (Signed)
Pt states hx of GI problems. Pt states spasms start above the stomach and move up and sometimes go to back.

## 2012-04-28 NOTE — ED Notes (Signed)
Old and new EKG given to Dr Freida Busman

## 2012-04-28 NOTE — ED Notes (Signed)
Rash is resolved, and pt states no more itching.  Pt to [place] via [employer]

## 2012-04-29 ENCOUNTER — Ambulatory Visit (INDEPENDENT_AMBULATORY_CARE_PROVIDER_SITE_OTHER): Payer: 59 | Admitting: Physician Assistant

## 2012-04-29 ENCOUNTER — Encounter: Payer: Self-pay | Admitting: Physician Assistant

## 2012-04-29 ENCOUNTER — Telehealth: Payer: Self-pay | Admitting: Internal Medicine

## 2012-04-29 ENCOUNTER — Ambulatory Visit (HOSPITAL_COMMUNITY): Payer: 59

## 2012-04-29 VITALS — BP 112/74 | HR 92 | Ht 62.5 in | Wt 124.0 lb

## 2012-04-29 DIAGNOSIS — R079 Chest pain, unspecified: Secondary | ICD-10-CM

## 2012-04-29 DIAGNOSIS — Z8719 Personal history of other diseases of the digestive system: Secondary | ICD-10-CM

## 2012-04-29 DIAGNOSIS — I2510 Atherosclerotic heart disease of native coronary artery without angina pectoris: Secondary | ICD-10-CM

## 2012-04-29 DIAGNOSIS — R1013 Epigastric pain: Secondary | ICD-10-CM

## 2012-04-29 DIAGNOSIS — R1310 Dysphagia, unspecified: Secondary | ICD-10-CM

## 2012-04-29 LAB — COMPREHENSIVE METABOLIC PANEL
ALT: 19 U/L (ref 0–35)
AST: 20 U/L (ref 0–37)
Albumin: 4.1 g/dL (ref 3.5–5.2)
Alkaline Phosphatase: 76 U/L (ref 39–117)
BUN: 15 mg/dL (ref 6–23)
CO2: 29 mEq/L (ref 19–32)
Calcium: 9.3 mg/dL (ref 8.4–10.5)
Chloride: 103 mEq/L (ref 96–112)
Creatinine, Ser: 0.85 mg/dL (ref 0.50–1.10)
GFR calc Af Amer: 85 mL/min — ABNORMAL LOW (ref >90)
GFR calc non Af Amer: 73 mL/min — ABNORMAL LOW (ref >90)
Glucose, Bld: 96 mg/dL (ref 70–99)
Potassium: 3.3 mEq/L — ABNORMAL LOW (ref 3.5–5.1)
Sodium: 140 mEq/L (ref 135–145)
Total Bilirubin: 0.3 mg/dL (ref 0.3–1.2)
Total Protein: 7 g/dL (ref 6.0–8.3)

## 2012-04-29 LAB — LIPASE, BLOOD: Lipase: 42 U/L (ref 11–59)

## 2012-04-29 LAB — TROPONIN I: Troponin I: <0.3 ng/mL (ref <0.30)

## 2012-04-29 MED ORDER — ONDANSETRON HCL 4 MG/2ML IJ SOLN
4.0000 mg | Freq: Once | INTRAMUSCULAR | Status: AC
  Administered 2012-04-29: 4 mg via INTRAVENOUS
  Filled 2012-04-29: qty 2

## 2012-04-29 MED ORDER — LORAZEPAM 2 MG/ML IJ SOLN
1.0000 mg | Freq: Once | INTRAMUSCULAR | Status: AC
  Administered 2012-04-29: 1 mg via INTRAVENOUS
  Filled 2012-04-29: qty 1

## 2012-04-29 MED ORDER — MORPHINE SULFATE 2 MG/ML IJ SOLN
2.0000 mg | Freq: Once | INTRAMUSCULAR | Status: AC
  Administered 2012-04-29: 2 mg via INTRAVENOUS
  Filled 2012-04-29: qty 1

## 2012-04-29 NOTE — Progress Notes (Signed)
Reviewed and discussed with Amy. Plan to proceed with EGD in am.

## 2012-04-29 NOTE — Interval H&P Note (Signed)
History and Physical Interval Note:  04/29/2012 9:47 PM  [name]  has presented today for surgery, with the diagnosis of Chest pain [786.50] Dysphagia [787.20] Esophageal spasm [530.5]  The various methods of treatment have been discussed with the patient and family. After consideration of risks, benefits and other options for treatment, the patient has consented to  Procedure(s) (LRB) with comments: ESOPHAGOGASTRODUODENOSCOPY (EGD) (N/A) as a surgical intervention .  The patient's history has been reviewed, patient examined, no change in status, stable for surgery.  I have reviewed the patient's chart and labs.  Questions were answered to the patient's satisfaction.     Lina Sar

## 2012-04-29 NOTE — ED Provider Notes (Signed)
History     CSN: [id number]  Arrival date & time 04/28/12  2234   First MD Initiated Contact with Patient 04/28/12 2300      Chief Complaint  Patient presents with  . Abdominal Pain    (Consider location/radiation/quality/duration/timing/severity/associated sxs/prior treatment) HPI Comments: + chronic abd pain,  Recent cardiac work up,  Neg per pt.  With appointment for abd [place] tomorrow.  Now with acute worsening pain today that has subsequently resolved.   Patient is a 61 y.o. female presenting with abdominal pain. The history is provided by the patient.  Abdominal Pain The primary symptoms of the illness include abdominal pain. The current episode started 3 to 5 hours ago. The onset of the illness was gradual. The problem has been rapidly improving.  The illness is associated with eating. The patient states that she believes she is currently not pregnant. The patient has not had a change in bowel habit.    Past Medical History  Diagnosis Date  . Coronary artery disease   . GERD (gastroesophageal reflux disease)   . IBS (irritable bowel syndrome)     Past Surgical History  Procedure Date  . Back surgery   . Abdominal hysterectomy   . Breast biopsy   . Ovarian cyst removal     History reviewed. No pertinent family history.  History  Substance Use Topics  . Smoking status: Never Smoker   . Smokeless tobacco: Never Used  . Alcohol Use: Yes     Comment: occasional    OB History    Grav Para Term Preterm Abortions TAB SAB Ect Mult Living                  Review of Systems  Gastrointestinal: Positive for abdominal pain.  All other systems reviewed and are negative.    Allergies  Clindamycin/lincomycin; Doxycycline; Escitalopram oxalate; Hydrocodone; Macrodantin; and Trazodone and nefazodone  Home Medications   Current Outpatient Rx  Name  Route  Sig  Dispense  Refill  . ACYCLOVIR 400 MG PO TABS   Oral   Take 400 mg by mouth 2 (two) times daily.           . ASPIRIN 81 MG PO TABS   Oral   Take 81 mg by mouth at bedtime.          . ATORVASTATIN CALCIUM 20 MG PO TABS   Oral   Take 20 mg by mouth at bedtime.          Marland Kitchen VITAMIN D 1000 UNITS PO TABS   Oral   Take 1,000 Units by mouth at bedtime.          Marland Kitchen CLINDINIUM-CHLORDIAZEPOXIDE 2.5-5 MG PO CAPS   Oral   Take 1 capsule by mouth 3 (three) times daily as needed. For esophagus         . DICYCLOMINE HCL 10 MG PO CAPS   Oral   Take 10 mg by mouth 3 (three) times daily as needed. For IBS         . DILTIAZEM GEL 2 %   Topical   Apply 1 application topically as needed. For rectal spasm         . DIPHENHYDRAMINE HCL 25 MG PO TABS   Oral   Take 25 mg by mouth at bedtime.          Marland Kitchen ESOMEPRAZOLE MAGNESIUM 40 MG PO CPDR   Oral   Take 40 mg by mouth daily before breakfast.         .  ESTRADIOL 0.05 MG/24HR TD PTWK   Transdermal   Place 1 patch onto the skin once a week. Applies new patch on Monday         . HYOSCYAMINE SULFATE 0.125 MG SL SUBL   Sublingual   Place 0.125 mg under the tongue every 4 (four) hours as needed. For cramping         . METAFOLBIC PLUS 6-2-600 MG PO TABS   Oral   Take 1 tablet by mouth every morning.          . MULTIVITAMIN PO   Oral   Take 1 tablet by mouth at bedtime.          Marland Kitchen NITROGLYCERIN 0.4 MG SL SUBL   Sublingual   Place 0.4 mg under the tongue every 5 (five) minutes as needed. For chest pain         . OXYCODONE-ACETAMINOPHEN 5-325 MG PO TABS   Oral   Take 1 tablet by mouth every 4 (four) hours as needed. For pain         . RANOLAZINE ER 500 MG PO TB12   Oral   Take 500-1,000 mg by mouth 2 (two) times daily. Take 2 tablets in AM, 1 tablet in PM         . TEMAZEPAM 15 MG PO CAPS   Oral   Take 1 capsule by mouth 2 (two) times a week. Does not take temazepam on a specific day           BP 123/95  Pulse 77  Temp 98.3 F (36.8 C) (Oral)  Resp 20  SpO2 97%  Physical Exam  Constitutional: She is  oriented to person, place, and time. She appears well-developed and well-nourished.  HENT:  Head: Normocephalic and atraumatic.  Eyes: Conjunctivae normal and EOM are normal. Pupils are equal, round, and reactive to light.  Neck: Normal range of motion.  Cardiovascular: Normal rate, regular rhythm and normal heart sounds.   Pulmonary/Chest: Effort normal and breath sounds normal.  Abdominal: Soft. Bowel sounds are normal. There is tenderness.       Tenderness to epigastrium  Musculoskeletal: Normal range of motion.  Neurological: She is alert and oriented to person, place, and time.  Skin: Skin is warm and dry. Rash noted.       + urticarial rash to skin folds  Psychiatric: She has a normal mood and affect. Her behavior is normal.    ED Course  Procedures (including critical care time)   Labs Reviewed  CBC  URINALYSIS, ROUTINE W REFLEX MICROSCOPIC  COMPREHENSIVE METABOLIC PANEL  LIPASE, BLOOD  TROPONIN I   No results found.   No diagnosis found.   Date: 04/29/2012  Rate: 64  Rhythm: normal sinus rhythm  QRS Axis: normal  Intervals: normal  ST/T Wave abnormalities: normal  Conduction Disutrbances: none  Narrative Interpretation: unremarkable     MDM  + chronic abd pain, with outp gi fu.  Will labs,  [place] tonight,  reassess  Improved.  Ce,  Labs, [place] neg.  Will dc to fu outpt,  Ret new/worsening sxs uriticaria resolved unsure etiology.     Jeanene Mena Lytle Michaels, MD 04/29/12 [phone]

## 2012-04-29 NOTE — ED Notes (Signed)
Attempted to discharge pt. States that she really doesn't want to go home and that she would like to have her EGD here.  Explained to pt that the EGD is not an emergent procedure that can be done in the ED in the middle of the evening. Pt states she will call family for ride home.

## 2012-04-29 NOTE — ED Notes (Signed)
Pt tearful. States she does not want to wait until the 10th of December for her EGD.  MD at bedside explaining test results

## 2012-04-29 NOTE — Telephone Encounter (Signed)
please have her see an extender ASAP

## 2012-04-29 NOTE — ED Notes (Signed)
Pt given meds for anxiety and "spasms" in esophagus.side rails up x 2 call bell at bedside

## 2012-04-29 NOTE — ED Notes (Signed)
Husband to bedside

## 2012-04-29 NOTE — ED Notes (Signed)
Pt remains in [place]

## 2012-04-29 NOTE — Telephone Encounter (Signed)
Patient crying on phone. States she had chest pain yesterday- 3 episodes. At 8-9 PM, she had "unliveable pain" not like her normal spasms. Hurt from stomach to chest. Ultrasound was done and was normal. Labs were normal. She states she does not think she can wait until 05/19/12 for EGD. Please, advise.

## 2012-04-29 NOTE — ED Notes (Signed)
Pt states she is feeling better and that she is ready to go home

## 2012-04-29 NOTE — Progress Notes (Signed)
Subjective:    Patient ID: [name], female    DOB: 03/09/1951, 61 y.o.   MRN: [id number]  HPI [name] is a pleasant 61-year-old white female known to Dr. Dora Brodie who has history of GERD, and probable esophageal spasms. She also has a diagnosis of nonobstructive coronary artery disease with angina and is being maintained on Ranexa. She also uses when necessary nitroglycerin. Patient had a hospitalization in October 2013 with chest pain radiating into her left arm and underwent cardiac catheter at that time with normal left ventricular function, widely patent circumflex, or CA, and proximal LAD. The mid LAD contains a greater than 50% stenosis with 70% stenosis in a focal eccentric region overlapping the origin of the third diagonal. She is being managed medically. Patient has had problems over the past several years with intermittent chest pain and says that she generally has 2 different types of pain area She had undergone prior esophageal manometry that was negative, she has not had endoscopy for several years. She has had abdominal ultrasounds and 2 previous CCK HIDA scans both of which were normal. She was seen in the office about a month ago and is scheduled to have an upper endoscopy on December 10 with Dr. Brodie. Unfortunately the frequency and intensity of her symptoms has been increasing over the past several weeks and she had an ER visit last night due to severe pain. She had a normal EKG and normal troponin, was treated with morphine for pain and allowed discharged to home. She said she had onset of pain yesterday morning which woke her from sleep was located in the epigastric area which was unusual for her with radiation straight through into her back she describes it as a fist-like sensation. She said she had pain off and on all day was able to eat some dinner and then an hour or so later started with more intense pain again lasted over an hour and EMS was called. By the time she was  seen in the emergency room her pain had improved though had not completely resolved. She did not have any associated shortness of breath dizziness diaphoresis nausea or vomiting. Today she says she continues to have pain ever since the morphine  wore off. She took Librax left last evening but has not taken any today, was advised by . our office to take 2 Levsin prior to coming to the office and says that that seemed to help as well she is more comfortable but the pain is not completely gone. She also feels that she's having some intermittent dysphagia . She is frustrated because she doesn't have any answers and was sent home from the emergency room still having pain. She's not what sure what to use for medication.    Review of Systems  Constitutional: Negative.   HENT: Positive for trouble swallowing.   Eyes: Negative.   Respiratory: Negative.   Cardiovascular: Positive for chest pain.  Gastrointestinal: Positive for abdominal pain.  Genitourinary: Negative.   Musculoskeletal: Positive for back pain.  Skin: Negative.   Neurological: Negative.   Hematological: Negative.   Psychiatric/Behavioral: The patient is nervous/anxious.    Outpatient Prescriptions Prior to Visit  Medication Sig Dispense Refill  . acyclovir (ZOVIRAX) 400 MG tablet Take 400 mg by mouth 2 (two) times daily.      . aspirin 81 MG tablet Take 81 mg by mouth at bedtime.       . atorvastatin (LIPITOR) 20 MG tablet Take 20 mg by   mouth at bedtime.       . cholecalciferol (VITAMIN D) 1000 UNITS tablet Take 1,000 Units by mouth at bedtime.       . clidinium-chlordiazePOXIDE (LIBRAX) 2.5-5 MG per capsule Take 1 capsule by mouth 3 (three) times daily as needed. For esophagus      . dicyclomine (BENTYL) 10 MG capsule Take 10 mg by mouth 3 (three) times daily as needed. For IBS      . diltiazem 2 % GEL Apply 1 application topically as needed. For rectal spasm      . diphenhydrAMINE (BENADRYL) 25 MG tablet Take 25 mg by mouth at  bedtime.       . esomeprazole (NEXIUM) 40 MG capsule Take 40 mg by mouth daily before breakfast.      . hyoscyamine (LEVSIN SL) 0.125 MG SL tablet Place 0.125 mg under the tongue every 4 (four) hours as needed. For cramping      . Methylfol-Methylcob-Acetylcyst (METAFOLBIC PLUS) 6-2-600 MG TABS Take 1 tablet by mouth every morning.       . Multiple Vitamins-Minerals (MULTIVITAMIN PO) Take 1 tablet by mouth at bedtime.       . nitroGLYCERIN (NITROSTAT) 0.4 MG SL tablet Place 0.4 mg under the tongue every 5 (five) minutes as needed. For chest pain      . oxyCODONE-acetaminophen (PERCOCET/ROXICET) 5-325 MG per tablet Take 1 tablet by mouth every 4 (four) hours as needed. For pain      . ranolazine (RANEXA) 500 MG 12 hr tablet Take 500-1,000 mg by mouth 2 (two) times daily. Take 2 tablets in AM, 1 tablet in PM      . temazepam (RESTORIL) 15 MG capsule Take 1 capsule by mouth 2 (two) times a week. Does not take temazepam on a specific day      . estradiol (CLIMARA - DOSED IN MG/24 HR) 0.05 mg/24hr Place 1 patch onto the skin once a week. Applies new patch on Monday       Facility-Administered Medications Prior to Visit  Medication Dose Route Frequency Provider Last Rate Last Dose  . [COMPLETED] LORazepam (ATIVAN) injection 1 mg  1 mg Intravenous Once Chionesu K Sonyika, MD   1 mg at 04/29/12 0148  . [COMPLETED] morphine 2 MG/ML injection 2 mg  2 mg Intravenous Once Chionesu K Sonyika, MD   2 mg at 04/29/12 0149  . [COMPLETED] ondansetron (ZOFRAN) injection 4 mg  4 mg Intravenous Once Chionesu K Sonyika, MD   4 mg at 04/29/12 0148  . [COMPLETED] sodium chloride 0.9 % bolus 1,000 mL  1,000 mL Intravenous Once Chionesu K Sonyika, MD   1,000 mL at 04/28/12 2336   Allergies  Allergen Reactions  . Clindamycin/Lincomycin Other (See Comments)    Reaction unknown  . Doxycycline Nausea Only  . Escitalopram Oxalate Nausea Only  . Hydrocodone Itching  . Levsin (Hyoscyamine Sulfate) Hives and Swelling  .  Macrodantin Other (See Comments)    Flushed, hot  . Trazodone And Nefazodone Nausea Only      Patient Active Problem List  Diagnosis  . HYPERLIPIDEMIA  . HYPERTENSION  . CORONARY ARTERY DISEASE  . CVA  . GERD  . DYSPEPSIA  . Irritable bowel syndrome  . PROCTALGIA FUGAX  . FREQUENCY, URINARY  . Chest pain  . Syncope   History  Substance Use Topics  . Smoking status: Never Smoker   . Smokeless tobacco: Never Used  . Alcohol Use: Yes     Comment: occasional      Objective:   Physical Exam  well-developed white female in no acute distress, accompanied by her husband blood pressure 112/74 pulse 92 height 5 foot 2 weight 124. HEENT; nontraumatic normocephalic EOMI PERRLA sclera anicteric,Neck; Supple no JVD, Cardiovascula;r regular rate and rhythm with S1-S2 no murmur or gallop, no tenderness to palpation of the chest wall Pulmonary; clear bilaterally;, Abdomen; soft nondistended bowel sounds are active there is no palpable mass or hepatosplenomegaly , she has mild tenderness to palpation in the epigastrium and subxiphoid., Rectal; exam not done, Extremities; no clubbing cyanosis or edema skin warm and dry, Psych; mood and affect normal and appropriate       Assessment & Plan:  #1 61-year-old female with long history of intermittent chest pain which has been felt secondary to esophageal spasms, though not proven to have any motility disorder on prior  Manometry She does have documented coronary artery disease which is felt to be nonobstructive and she is being maintained on Ranexa as an antianginal . She has had increase in frequency and intensity of symptoms over the past few weeks and has also had more discomfort in the epigastrium than with prior episodes Prior workup for gallbladder disease has been unrevealing with negative ultrasounds and CCK HIDA scans area She had another ultrasound yesterday which is normal.  Plan; discussed with Dr. Brodie, and have rescheduled her endoscopy  for tomorrow 04/30/2012. She will probably need a barium swallow/upper GI scheduled for next week Continue Nexium 40 mg daily Have advised her to take an anti-spasmodic scheduled rather than as needed. She has been till and Librax at home, and will alternate with one or the other every 6 hours. For  more severe pain she will use hydrocodone, and nitroglycerin and if  pain is refractory then seek attention  in the emergency room.  ### Patient had taken 2 sublingual Levsin prior to this office visit, and as she was leaving he stated that her lip feels a little bit swollen and it does look puffy, she also has 2 very small hives on her abdomen. She was noted to have some hives on her abdomen last evening in the emergency room as well which apparently resolved while she was there appear.. she will use Benadryl this evening as needed, and stopped Levsin sublingual. She states she had this listed as an allergy in the past, but was not sure what reaction she had to it-list again as an allergy.  

## 2012-04-29 NOTE — H&P (View-Only) (Signed)
Subjective:    Patient ID: [name], female    DOB: 20-Jul-1950, 61 y.o.   MRN: [id number]  HPI [name] is a pleasant 61 year old white female known to Dr. Lina Sar who has history of GERD, and probable esophageal spasms. She also has a diagnosis of nonobstructive coronary artery disease with angina and is being maintained on Ranexa. She also uses when necessary nitroglycerin. Patient had a hospitalization in October 2013 with chest pain radiating into her left arm and underwent cardiac catheter at that time with normal left ventricular function, widely patent circumflex, or CA, and proximal LAD. The mid LAD contains a greater than 50% stenosis with 70% stenosis in a focal eccentric region overlapping the origin of the third diagonal. She is being managed medically. Patient has had problems over the past several years with intermittent chest pain and says that she generally has 2 different types of pain area She had undergone prior esophageal manometry that was negative, she has not had endoscopy for several years. She has had abdominal ultrasounds and 2 previous CCK HIDA scans both of which were normal. She was seen in the office about a month ago and is scheduled to have an upper endoscopy on December 10 with Dr. Juanda Chance. Unfortunately the frequency and intensity of her symptoms has been increasing over the past several weeks and she had an ER visit last night due to severe pain. She had a normal EKG and normal troponin, was treated with morphine for pain and allowed discharged to home. She said she had onset of pain yesterday morning which woke her from sleep was located in the epigastric area which was unusual for her with radiation straight through into her back she describes it as a fist-like sensation. She said she had pain off and on all day was able to eat some dinner and then an hour or so later started with more intense pain again lasted over an hour and EMS was called. By the time she was  seen in the emergency room her pain had improved though had not completely resolved. She did not have any associated shortness of breath dizziness diaphoresis nausea or vomiting. Today she says she continues to have pain ever since the morphine  wore off. She took Librax left last evening but has not taken any today, was advised by . our office to take 2 Levsin prior to coming to the office and says that that seemed to help as well she is more comfortable but the pain is not completely gone. She also feels that she's having some intermittent dysphagia . She is frustrated because she doesn't have any answers and was sent home from the emergency room still having pain. She's not what sure what to use for medication.    Review of Systems  Constitutional: Negative.   HENT: Positive for trouble swallowing.   Eyes: Negative.   Respiratory: Negative.   Cardiovascular: Positive for chest pain.  Gastrointestinal: Positive for abdominal pain.  Genitourinary: Negative.   Musculoskeletal: Positive for back pain.  Skin: Negative.   Neurological: Negative.   Hematological: Negative.   Psychiatric/Behavioral: The patient is nervous/anxious.    Outpatient Prescriptions Prior to Visit  Medication Sig Dispense Refill  . acyclovir (ZOVIRAX) 400 MG tablet Take 400 mg by mouth 2 (two) times daily.      Marland Kitchen aspirin 81 MG tablet Take 81 mg by mouth at bedtime.       Marland Kitchen atorvastatin (LIPITOR) 20 MG tablet Take 20 mg by  mouth at bedtime.       . cholecalciferol (VITAMIN D) 1000 UNITS tablet Take 1,000 Units by mouth at bedtime.       . clidinium-chlordiazePOXIDE (LIBRAX) 2.5-5 MG per capsule Take 1 capsule by mouth 3 (three) times daily as needed. For esophagus      . dicyclomine (BENTYL) 10 MG capsule Take 10 mg by mouth 3 (three) times daily as needed. For IBS      . diltiazem 2 % GEL Apply 1 application topically as needed. For rectal spasm      . diphenhydrAMINE (BENADRYL) 25 MG tablet Take 25 mg by mouth at  bedtime.       Marland Kitchen esomeprazole (NEXIUM) 40 MG capsule Take 40 mg by mouth daily before breakfast.      . hyoscyamine (LEVSIN SL) 0.125 MG SL tablet Place 0.125 mg under the tongue every 4 (four) hours as needed. For cramping      . Methylfol-Methylcob-Acetylcyst (METAFOLBIC PLUS) 6-2-600 MG TABS Take 1 tablet by mouth every morning.       . Multiple Vitamins-Minerals (MULTIVITAMIN PO) Take 1 tablet by mouth at bedtime.       . nitroGLYCERIN (NITROSTAT) 0.4 MG SL tablet Place 0.4 mg under the tongue every 5 (five) minutes as needed. For chest pain      . oxyCODONE-acetaminophen (PERCOCET/ROXICET) 5-325 MG per tablet Take 1 tablet by mouth every 4 (four) hours as needed. For pain      . ranolazine (RANEXA) 500 MG 12 hr tablet Take 500-1,000 mg by mouth 2 (two) times daily. Take 2 tablets in AM, 1 tablet in PM      . temazepam (RESTORIL) 15 MG capsule Take 1 capsule by mouth 2 (two) times a week. Does not take temazepam on a specific day      . estradiol (CLIMARA - DOSED IN MG/24 HR) 0.05 mg/24hr Place 1 patch onto the skin once a week. Applies new patch on Monday       Facility-Administered Medications Prior to Visit  Medication Dose Route Frequency Provider Last Rate Last Dose  . [COMPLETED] LORazepam (ATIVAN) injection 1 mg  1 mg Intravenous Once Chionesu Lytle Michaels, MD   1 mg at 04/29/12 0148  . [COMPLETED] morphine 2 MG/ML injection 2 mg  2 mg Intravenous Once Chionesu Lytle Michaels, MD   2 mg at 04/29/12 0149  . [COMPLETED] ondansetron (ZOFRAN) injection 4 mg  4 mg Intravenous Once Chionesu Lytle Michaels, MD   4 mg at 04/29/12 0148  . [COMPLETED] sodium chloride 0.9 % bolus 1,000 mL  1,000 mL Intravenous Once Chionesu Lytle Michaels, MD   1,000 mL at 04/28/12 2336   Allergies  Allergen Reactions  . Clindamycin/Lincomycin Other (See Comments)    Reaction unknown  . Doxycycline Nausea Only  . Escitalopram Oxalate Nausea Only  . Hydrocodone Itching  . Levsin (Hyoscyamine Sulfate) Hives and Swelling  .  Macrodantin Other (See Comments)    Flushed, hot  . Trazodone And Nefazodone Nausea Only      Patient Active Problem List  Diagnosis  . HYPERLIPIDEMIA  . HYPERTENSION  . CORONARY ARTERY DISEASE  . CVA  . GERD  . DYSPEPSIA  . Irritable bowel syndrome  . PROCTALGIA FUGAX  . FREQUENCY, URINARY  . Chest pain  . Syncope   History  Substance Use Topics  . Smoking status: Never Smoker   . Smokeless tobacco: Never Used  . Alcohol Use: Yes     Comment: occasional  Objective:   Physical Exam  well-developed white female in no acute distress, accompanied by her husband blood pressure 112/74 pulse 92 height 5 foot 2 weight 124. HEENT; nontraumatic normocephalic EOMI PERRLA sclera anicteric,Neck; Supple no JVD, Cardiovascula;r regular rate and rhythm with S1-S2 no murmur or gallop, no tenderness to palpation of the chest wall Pulmonary; clear bilaterally;, Abdomen; soft nondistended bowel sounds are active there is no palpable mass or hepatosplenomegaly , she has mild tenderness to palpation in the epigastrium and subxiphoid., Rectal; exam not done, Extremities; no clubbing cyanosis or edema skin warm and dry, Psych; mood and affect normal and appropriate       Assessment & Plan:  #48 61 year old female with long history of intermittent chest pain which has been felt secondary to esophageal spasms, though not proven to have any motility disorder on prior  Manometry She does have documented coronary artery disease which is felt to be nonobstructive and she is being maintained on Ranexa as an antianginal . She has had increase in frequency and intensity of symptoms over the past few weeks and has also had more discomfort in the epigastrium than with prior episodes Prior workup for gallbladder disease has been unrevealing with negative ultrasounds and CCK HIDA scans area She had another ultrasound yesterday which is normal.  Plan; discussed with Dr. Juanda Chance, and have rescheduled her endoscopy  for tomorrow 04/30/2012. She will probably need a barium swallow/upper GI scheduled for next week Continue Nexium 40 mg daily Have advised her to take an anti-spasmodic scheduled rather than as needed. She has been till and Librax at home, and will alternate with one or the other every 6 hours. For  more severe pain she will use hydrocodone, and nitroglycerin and if  pain is refractory then seek attention  in the emergency room.  ### Patient had taken 2 sublingual Levsin prior to this office visit, and as she was leaving he stated that her lip feels a little bit swollen and it does look puffy, she also has 2 very small hives on her abdomen. She was noted to have some hives on her abdomen last evening in the emergency room as well which apparently resolved while she was there appear.. she will use Benadryl this evening as needed, and stopped Levsin sublingual. She states she had this listed as an allergy in the past, but was not sure what reaction she had to it-list again as an allergy.

## 2012-04-29 NOTE — Telephone Encounter (Signed)
I spoke with the patient . She was tearful and having spasms.  She has tried Librax about 2 hours ago with no relief.  I have asked that she try 2 levsin and be here by 2 to see Mike Gip PA .  She is agreeable to this plan.

## 2012-04-29 NOTE — Patient Instructions (Addendum)
Alternate Librax and Bentyl every 6 hours. Stay on the regular doses. For severe episodes put 1 Levsin under the tongue and you may also use a nitro glycerine tablet under the tongue. Eat a soft bland diet.   We did reschedule the Endoscopy for tomorrow 04-30-2012 at [facility], 1st floor. Directions provided.

## 2012-04-30 ENCOUNTER — Encounter (HOSPITAL_COMMUNITY): Payer: Self-pay | Admitting: *Deleted

## 2012-04-30 ENCOUNTER — Encounter (HOSPITAL_COMMUNITY)
Admission: RE | Disposition: A | Discharge status: Home / Self Care | Payer: Self-pay | Source: Ambulatory Visit | Attending: Internal Medicine

## 2012-04-30 ENCOUNTER — Ambulatory Visit (HOSPITAL_COMMUNITY)
Admission: RE | Admit: 2012-04-30 | Discharge: 2012-04-30 | Disposition: A | Discharge status: Home / Self Care | Payer: 59 | Source: Ambulatory Visit | Attending: Internal Medicine | Admitting: Internal Medicine

## 2012-04-30 ENCOUNTER — Telehealth: Payer: Self-pay | Admitting: *Deleted

## 2012-04-30 ENCOUNTER — Other Ambulatory Visit: Payer: Self-pay | Admitting: *Deleted

## 2012-04-30 DIAGNOSIS — R220 Localized swelling, mass and lump, head: Secondary | ICD-10-CM | POA: Insufficient documentation

## 2012-04-30 DIAGNOSIS — R1310 Dysphagia, unspecified: Secondary | ICD-10-CM | POA: Insufficient documentation

## 2012-04-30 DIAGNOSIS — K219 Gastro-esophageal reflux disease without esophagitis: Secondary | ICD-10-CM | POA: Insufficient documentation

## 2012-04-30 DIAGNOSIS — E785 Hyperlipidemia, unspecified: Secondary | ICD-10-CM | POA: Insufficient documentation

## 2012-04-30 DIAGNOSIS — K589 Irritable bowel syndrome without diarrhea: Secondary | ICD-10-CM | POA: Insufficient documentation

## 2012-04-30 DIAGNOSIS — Z8719 Personal history of other diseases of the digestive system: Secondary | ICD-10-CM

## 2012-04-30 DIAGNOSIS — M549 Dorsalgia, unspecified: Secondary | ICD-10-CM | POA: Insufficient documentation

## 2012-04-30 DIAGNOSIS — R1319 Other dysphagia: Secondary | ICD-10-CM

## 2012-04-30 DIAGNOSIS — R1013 Epigastric pain: Secondary | ICD-10-CM

## 2012-04-30 DIAGNOSIS — R0789 Other chest pain: Secondary | ICD-10-CM | POA: Insufficient documentation

## 2012-04-30 DIAGNOSIS — K224 Dyskinesia of esophagus: Secondary | ICD-10-CM | POA: Insufficient documentation

## 2012-04-30 DIAGNOSIS — R079 Chest pain, unspecified: Secondary | ICD-10-CM

## 2012-04-30 DIAGNOSIS — K2289 Other specified disease of esophagus: Secondary | ICD-10-CM | POA: Insufficient documentation

## 2012-04-30 DIAGNOSIS — K228 Other specified diseases of esophagus: Secondary | ICD-10-CM | POA: Insufficient documentation

## 2012-04-30 DIAGNOSIS — Z8673 Personal history of transient ischemic attack (TIA), and cerebral infarction without residual deficits: Secondary | ICD-10-CM | POA: Insufficient documentation

## 2012-04-30 DIAGNOSIS — I10 Essential (primary) hypertension: Secondary | ICD-10-CM | POA: Insufficient documentation

## 2012-04-30 DIAGNOSIS — K2100 Gastro-esophageal reflux disease with esophagitis, without bleeding: Secondary | ICD-10-CM

## 2012-04-30 DIAGNOSIS — I2510 Atherosclerotic heart disease of native coronary artery without angina pectoris: Secondary | ICD-10-CM | POA: Insufficient documentation

## 2012-04-30 DIAGNOSIS — Z79899 Other long term (current) drug therapy: Secondary | ICD-10-CM | POA: Insufficient documentation

## 2012-04-30 HISTORY — PX: ESOPHAGOGASTRODUODENOSCOPY: SHX5428

## 2012-04-30 HISTORY — DX: Other specified postprocedural states: Z98.890

## 2012-04-30 HISTORY — DX: Nausea with vomiting, unspecified: R11.2

## 2012-04-30 SURGERY — EGD (ESOPHAGOGASTRODUODENOSCOPY)
Anesthesia: Moderate Sedation

## 2012-04-30 MED ORDER — MIDAZOLAM HCL 10 MG/2ML IJ SOLN
INTRAMUSCULAR | Status: AC
  Filled 2012-04-30: qty 2

## 2012-04-30 MED ORDER — ALPRAZOLAM 0.25 MG PO TABS: 0.2500 mg | ORAL_TABLET | Freq: Three times a day (TID) | ORAL | Status: DC | PRN

## 2012-04-30 MED ORDER — SODIUM CHLORIDE 0.9 % IV SOLN
INTRAVENOUS | Status: DC
  Administered 2012-04-30: 500 mL via INTRAVENOUS

## 2012-04-30 MED ORDER — FENTANYL CITRATE 0.05 MG/ML IJ SOLN
INTRAMUSCULAR | Status: DC | PRN
  Administered 2012-04-30 (×2): 25 ug via INTRAVENOUS

## 2012-04-30 MED ORDER — MIDAZOLAM HCL 10 MG/2ML IJ SOLN
INTRAMUSCULAR | Status: DC | PRN
  Administered 2012-04-30: 1 mg via INTRAVENOUS
  Administered 2012-04-30 (×2): 2 mg via INTRAVENOUS

## 2012-04-30 MED ORDER — BUTAMBEN-TETRACAINE-BENZOCAINE 2-2-14 % EX AERO
INHALATION_SPRAY | CUTANEOUS | Status: DC | PRN
  Administered 2012-04-30: 2 via TOPICAL

## 2012-04-30 MED ORDER — FENTANYL CITRATE 0.05 MG/ML IJ SOLN
INTRAMUSCULAR | Status: AC
  Filled 2012-04-30: qty 2

## 2012-04-30 MED ORDER — FLUCONAZOLE 100 MG PO TABS: 100.0000 mg | ORAL_TABLET | Freq: Every day | ORAL | Status: DC

## 2012-04-30 MED ORDER — PREDNISONE (PAK) 10 MG PO TABS: 10.0000 mg | ORAL_TABLET | Freq: Every day | ORAL | Status: DC

## 2012-04-30 NOTE — Telephone Encounter (Signed)
[name] states patient does not need both tests. Told her MD wants both tests. She will schedule both.

## 2012-04-30 NOTE — Telephone Encounter (Signed)
Scheduled barium esophagram and UGI series at [facility] radiology on 05/06/12 at 9:15/9:30 AM with Elnita Maxwell. NPO after midnight.

## 2012-04-30 NOTE — Telephone Encounter (Signed)
Spoke with patient and gave her appointment date/time and instructions.

## 2012-04-30 NOTE — Op Note (Signed)
[facility] [address] [place], [address]   ENDOSCOPY PROCEDURE REPORT  PATIENT: [name], [name]  MR#: [id number] BIRTHDATE: 1951-05-31 , 61  yrs. old GENDER: Female ENDOSCOPIST: Hart Carwin, MD REFERRED BY:  Elby Showers, M.D. PROCEDURE DATE:  04/30/2012 PROCEDURE:  EGD w/ biopsy and Maloney dilation of esophagus ASA CLASS:     Class II INDICATIONS:  Chest pain.   Epigastric pain. hx  IBS/esophageal spasm, abd sono negative MEDICATIONS: These medications were titrated to patient response per physician's verbal order, Versed 5 mg IV, and Fentanyl 50 mcg IV TOPICAL ANESTHETIC: Cetacaine Spray  DESCRIPTION OF PROCEDURE: After the risks benefits and alternatives of the procedure were thoroughly explained, informed consent was obtained.  The Pentax Gastroscope [id number] endoscope was introduced through the mouth and advanced to the second portion of the duodenum. Without limitations.  The instrument was slowly withdrawn as the mucosa was fully examined.        ESOPHAGUS: Abnormal mucosa was found in the entire esophagus. leukoplakia -like  mucosa with whit exudate and desquamation, no erosions or bleeding Retroflexed views revealed no abnormalities. The scope was then withdrawn from the patient and the procedure completed. No stricture. Maloney dilator 45 F passed without difficulty, biopsies taken  to r/o eosinophillic esophagitis  STOMACH: Bx's taken to r/o H.Pylori  COMPLICATIONS: There were no complications. 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

## 2012-05-01 ENCOUNTER — Encounter (HOSPITAL_COMMUNITY): Payer: Self-pay

## 2012-05-01 ENCOUNTER — Encounter: Payer: Self-pay | Admitting: Internal Medicine

## 2012-05-01 ENCOUNTER — Telehealth: Payer: Self-pay | Admitting: Internal Medicine

## 2012-05-01 ENCOUNTER — Emergency Department (HOSPITAL_BASED_OUTPATIENT_CLINIC_OR_DEPARTMENT_OTHER)
Admission: EM | Admit: 2012-05-01 | Discharge: 2012-05-01 | Disposition: A | Discharge status: Home / Self Care | Payer: 59 | Attending: Emergency Medicine | Admitting: Emergency Medicine

## 2012-05-01 ENCOUNTER — Encounter (HOSPITAL_COMMUNITY): Payer: Self-pay | Admitting: Internal Medicine

## 2012-05-01 DIAGNOSIS — R21 Rash and other nonspecific skin eruption: Secondary | ICD-10-CM | POA: Insufficient documentation

## 2012-05-01 DIAGNOSIS — K589 Irritable bowel syndrome without diarrhea: Secondary | ICD-10-CM | POA: Insufficient documentation

## 2012-05-01 DIAGNOSIS — I2510 Atherosclerotic heart disease of native coronary artery without angina pectoris: Secondary | ICD-10-CM | POA: Insufficient documentation

## 2012-05-01 DIAGNOSIS — L509 Urticaria, unspecified: Secondary | ICD-10-CM | POA: Insufficient documentation

## 2012-05-01 DIAGNOSIS — K219 Gastro-esophageal reflux disease without esophagitis: Secondary | ICD-10-CM | POA: Insufficient documentation

## 2012-05-01 DIAGNOSIS — Z79899 Other long term (current) drug therapy: Secondary | ICD-10-CM | POA: Insufficient documentation

## 2012-05-01 DIAGNOSIS — Z7982 Long term (current) use of aspirin: Secondary | ICD-10-CM | POA: Insufficient documentation

## 2012-05-01 MED ORDER — EPINEPHRINE HCL 1 MG/ML IJ SOLN
0.3000 mg | Freq: Once | INTRAMUSCULAR | Status: AC
  Administered 2012-05-01: 0.3 mg via SUBCUTANEOUS

## 2012-05-01 MED ORDER — EPINEPHRINE HCL 1 MG/ML IJ SOLN
INTRAMUSCULAR | Status: AC
  Administered 2012-05-01: 0.3 mg via SUBCUTANEOUS
  Filled 2012-05-01: qty 1

## 2012-05-01 MED ORDER — METHYLPREDNISOLONE SODIUM SUCC 125 MG IJ SOLR
125.0000 mg | Freq: Once | INTRAMUSCULAR | Status: AC
  Administered 2012-05-01: 125 mg via INTRAVENOUS
  Filled 2012-05-01: qty 2

## 2012-05-01 MED ORDER — HYDROXYZINE HCL 25 MG PO TABS: 25.0000 mg | ORAL_TABLET | Freq: Four times a day (QID) | ORAL | Status: DC

## 2012-05-01 MED ORDER — LORAZEPAM 2 MG/ML IJ SOLN
1.0000 mg | Freq: Once | INTRAMUSCULAR | Status: AC
  Administered 2012-05-01: 1 mg via INTRAVENOUS

## 2012-05-01 MED ORDER — DIPHENHYDRAMINE HCL 50 MG/ML IJ SOLN
25.0000 mg | Freq: Once | INTRAMUSCULAR | Status: AC
  Administered 2012-05-01: 25 mg via INTRAVENOUS

## 2012-05-01 MED ORDER — LORAZEPAM 2 MG/ML IJ SOLN
INTRAMUSCULAR | Status: AC
  Administered 2012-05-01: 1 mg via INTRAVENOUS
  Filled 2012-05-01: qty 1

## 2012-05-01 MED ORDER — DIPHENHYDRAMINE HCL 50 MG/ML IJ SOLN
INTRAMUSCULAR | Status: AC
  Administered 2012-05-01: 25 mg via INTRAVENOUS
  Filled 2012-05-01: qty 1

## 2012-05-01 MED ORDER — DIPHENHYDRAMINE HCL 50 MG/ML IJ SOLN
25.0000 mg | Freq: Once | INTRAMUSCULAR | Status: AC
  Administered 2012-05-01: 25 mg via INTRAVENOUS
  Filled 2012-05-01: qty 1

## 2012-05-01 MED ORDER — EPINEPHRINE 0.3 MG/0.3ML IJ DEVI
0.3000 mg | Freq: Once | INTRAMUSCULAR | Status: DC
  Filled 2012-05-01: qty 0.3

## 2012-05-01 NOTE — ED Notes (Signed)
Pt. Has noted hives from head to toe and reports she went to ED for esophageal probs. And she started with hives then and the hives have been coming back since.

## 2012-05-01 NOTE — ED Notes (Signed)
Pt. Has no noted itching at this time.  Pt. Is in no resp. Distress with no facial swelling.

## 2012-05-01 NOTE — ED Provider Notes (Signed)
History     CSN: [id number]  Arrival date & time 05/01/12  1403   First MD Initiated Contact with Patient 05/01/12 1423      Chief Complaint  Patient presents with  . Urticaria    (Consider location/radiation/quality/duration/timing/severity/associated sxs/prior treatment) HPI Comments: Patient was seen at [facility] 2 days ago for esophageal spasms.  While getting off the ambulance, she broke out into hives.  She has been given medications for this but does not seem to be improving.  She denies any chest pain or shortness of breath.  There is no throat swelling.  Patient is a 61 y.o. female presenting with urticaria. The history is provided by the patient.  Urticaria This is a new problem. The current episode started 2 days ago. The problem occurs constantly. The problem has been gradually worsening. Nothing aggravates the symptoms. Nothing relieves the symptoms. Treatments tried: meds as prescribed. The treatment provided no relief.    Past Medical History  Diagnosis Date  . Coronary artery disease   . GERD (gastroesophageal reflux disease)   . IBS (irritable bowel syndrome)   . PONV (postoperative nausea and vomiting)     Past Surgical History  Procedure Date  . Back surgery   . Abdominal hysterectomy   . Breast biopsy   . Ovarian cyst removal   . Esophagogastroduodenoscopy 04/30/2012    Procedure: ESOPHAGOGASTRODUODENOSCOPY (EGD);  Surgeon: Hart Carwin, MD;  Location: [facility];  Service: Endoscopy;  Laterality: N/A;    No family history on file.  History  Substance Use Topics  . Smoking status: Never Smoker   . Smokeless tobacco: Never Used  . Alcohol Use: Yes     Comment: occasional    OB History    Grav Para Term Preterm Abortions TAB SAB Ect Mult Living                  Review of Systems  Skin: Positive for rash.  All other systems reviewed and are negative.    Allergies  Clindamycin/lincomycin; Doxycycline; Escitalopram oxalate; Hydrocodone;  Levsin; Macrodantin; and Trazodone and nefazodone  Home Medications   Current Outpatient Rx  Name  Route  Sig  Dispense  Refill  . ALPRAZOLAM 0.25 MG PO TABS   Oral   Take 1 tablet (0.25 mg total) by mouth 3 (three) times daily as needed for sleep (stomach or esophagus spasm).   30 tablet   1   . ACYCLOVIR 400 MG PO TABS   Oral   Take 400 mg by mouth 2 (two) times daily.         . ASPIRIN 81 MG PO TABS   Oral   Take 81 mg by mouth at bedtime.          . ATORVASTATIN CALCIUM 20 MG PO TABS   Oral   Take 20 mg by mouth at bedtime.          Marland Kitchen VITAMIN D 1000 UNITS PO TABS   Oral   Take 1,000 Units by mouth at bedtime.          Marland Kitchen CLINDINIUM-CHLORDIAZEPOXIDE 2.5-5 MG PO CAPS   Oral   Take 1 capsule by mouth 3 (three) times daily as needed. For esophagus         . CLIMARA 0.0375 MG/24HR TD PTWK   Transdermal   Place 1 patch onto the skin once a week.          Marland Kitchen DICYCLOMINE HCL 10 MG PO CAPS   Oral  Take 10 mg by mouth 3 (three) times daily as needed. For IBS         . DILTIAZEM GEL 2 %   Topical   Apply 1 application topically as needed. For rectal spasm         . DIPHENHYDRAMINE HCL 25 MG PO TABS   Oral   Take 25 mg by mouth at bedtime.          Marland Kitchen ESOMEPRAZOLE MAGNESIUM 40 MG PO CPDR   Oral   Take 40 mg by mouth daily before breakfast.         . FLUCONAZOLE 100 MG PO TABS   Oral   Take 1 tablet (100 mg total) by mouth daily.   3 tablet   1   . HYOSCYAMINE SULFATE 0.125 MG SL SUBL   Sublingual   Place 0.125 mg under the tongue every 4 (four) hours as needed. For cramping         . METAFOLBIC PLUS 6-2-600 MG PO TABS   Oral   Take 1 tablet by mouth every morning.          . MULTIVITAMIN PO   Oral   Take 1 tablet by mouth at bedtime.          Marland Kitchen NITROGLYCERIN 0.4 MG SL SUBL   Sublingual   Place 0.4 mg under the tongue every 5 (five) minutes as needed. For chest pain         . OXYCODONE-ACETAMINOPHEN 5-325 MG PO TABS   Oral    Take 1 tablet by mouth every 4 (four) hours as needed. For pain         . PREDNISONE (PAK) 10 MG PO TABS   Oral   Take 1 tablet (10 mg total) by mouth daily. Take 2 tablets daily x 2 days, 1 1/2 tab daily x 2 days, 1 tab daily x 2 days, 1/2 tab daily x 2 days then stop   10 tablet   0   . RANOLAZINE ER 500 MG PO TB12   Oral   Take 500-1,000 mg by mouth 2 (two) times daily. Take 2 tablets in AM, 1 tablet in PM         . TEMAZEPAM 15 MG PO CAPS   Oral   Take 1 capsule by mouth 2 (two) times a week. Does not take temazepam on a specific day           BP 134/63  Pulse 88  Temp 97.8 F (36.6 C) (Oral)  Resp 18  SpO2 100%  Physical Exam  Nursing note and vitals reviewed. Constitutional: She is oriented to person, place, and time. She appears well-developed and well-nourished. No distress.  HENT:  Head: Normocephalic and atraumatic.  Neck: Normal range of motion. Neck supple.  Cardiovascular: Normal rate and regular rhythm.  Exam reveals no gallop and no friction rub.   No murmur heard. Pulmonary/Chest: Effort normal and breath sounds normal. No respiratory distress. She has no wheezes.  Abdominal: Soft. Bowel sounds are normal. She exhibits no distension. There is no tenderness.  Musculoskeletal: Normal range of motion.  Neurological: She is alert and oriented to person, place, and time.  Skin: Skin is warm and dry. She is not diaphoretic.       There is an urticarial rash present on the arms, legs, torso.      ED Course  Procedures (including critical care time)  Labs Reviewed - No data to display No results found.  No diagnosis found.    MDM  The patient presents with urticaria that have all but resolved with solumedrol and epinephrine.  She will be given atarax to be taken in place of benadryl, continue prednisone as prescribed.  Will discharge to home.        Geoffery Lyons, MD 05/01/12 [phone]

## 2012-05-01 NOTE — ED Notes (Signed)
Pt. Reports she is not ready to go home due to she feels like her heart is racing.  Pt. HR is 104 to 95 with no distress noted.  Pt. Husband at bedside.    Will attempt to discharge in another 15-20 mins.

## 2012-05-01 NOTE — Telephone Encounter (Signed)
Left a message for patient to call me. 

## 2012-05-04 NOTE — Telephone Encounter (Signed)
Spoke with patient and she went to ED for hives. Today, seems like a better day.

## 2012-05-06 ENCOUNTER — Ambulatory Visit (HOSPITAL_COMMUNITY)
Admit: 2012-05-06 | Discharge: 2012-05-06 | Disposition: A | Discharge status: Home / Self Care | Payer: 59 | Attending: Internal Medicine | Admitting: Internal Medicine

## 2012-05-06 DIAGNOSIS — K219 Gastro-esophageal reflux disease without esophagitis: Secondary | ICD-10-CM | POA: Insufficient documentation

## 2012-05-06 DIAGNOSIS — R1013 Epigastric pain: Secondary | ICD-10-CM | POA: Insufficient documentation

## 2012-05-06 DIAGNOSIS — R079 Chest pain, unspecified: Secondary | ICD-10-CM | POA: Insufficient documentation

## 2012-05-11 ENCOUNTER — Telehealth: Payer: Self-pay | Admitting: Gastroenterology

## 2012-05-11 NOTE — Telephone Encounter (Signed)
On call note at 2000. Pt with recurrent chest pain tonight that has not improved with SL NTG x 2 and Xanax x 1. Has had an extensive evaluation over the past few months. I spoke with her recently on call for same problem. Since then she has seen AE in the office, had a normal EGD by Dr. Juanda Chance and had a UGI series read as showing mild esophageal motility problems. Advised to use a second Xanax now and if pain persists take Percocet. To ED if no relief. Contact Dr. Juanda Chance or her nurse tomorrow for further advice.

## 2012-05-12 ENCOUNTER — Telehealth: Payer: Self-pay | Admitting: Internal Medicine

## 2012-05-12 NOTE — Telephone Encounter (Signed)
OK to take Bentyl bid. Also may add Ativan .5 mg , 1 sublingually tid prn chest pain

## 2012-05-12 NOTE — Telephone Encounter (Signed)
Left a message for patient to call me. 

## 2012-05-12 NOTE — Telephone Encounter (Signed)
I am not sure what else to do. Her GI work up has been completed. Hopefully it will settle down with time. She can make an appointment for January 2014. Continue all meds I gave her.

## 2012-05-12 NOTE — Telephone Encounter (Signed)
Spoke with patient and she had an "attack" last night. She took Xanax immediately. Did not help. She took a Nitro and it did not help. Had "peaks and valley's of pain." Took another Nitro. Did not help and she called Dr. Russella Dar. She took Oxycodone and minutes later was better. Entire episode lasted an hour. Please, advise.

## 2012-05-12 NOTE — Telephone Encounter (Signed)
Spoke with patient and scheduled her on 06/16/12 at 2:00 PM. Patient states she cannot take the "attacks" that last an hour like it did last night. She is asking if she should take the Dicyclomine BID on a routine basis. She is also asking if this could possibly be anxiety causing this. She is on Ranexa for heart issues and is wondering if it can cause this type of symptom. Patient becomes tearful when talking about her situation. Please, advise.

## 2012-05-13 MED ORDER — LORAZEPAM 0.5 MG PO TABS: ORAL_TABLET | ORAL | Status: DC

## 2012-05-13 NOTE — Telephone Encounter (Signed)
Patient notified of Dr. Regino Schultze recommendations. Patient understands the Ativan will replace Xanax. Rx sent to pharmacy.

## 2012-05-15 ENCOUNTER — Encounter: Payer: Self-pay | Admitting: *Deleted

## 2012-05-19 ENCOUNTER — Encounter: Payer: 59 | Admitting: Internal Medicine

## 2012-06-16 ENCOUNTER — Ambulatory Visit (INDEPENDENT_AMBULATORY_CARE_PROVIDER_SITE_OTHER): Payer: BC Managed Care – PPO | Admitting: Internal Medicine

## 2012-06-16 ENCOUNTER — Encounter: Payer: Self-pay | Admitting: Internal Medicine

## 2012-06-16 VITALS — BP 120/60 | HR 60 | Ht 62.5 in | Wt 127.8 lb

## 2012-06-16 DIAGNOSIS — K224 Dyskinesia of esophagus: Secondary | ICD-10-CM

## 2012-06-16 NOTE — Patient Instructions (Addendum)
Dr Elby Showers

## 2012-06-16 NOTE — Progress Notes (Signed)
[name] December 21, 1950 MRN [id number]   History of Present Illness:  This is a 61 year old, white female with esophageal spasm and noncardiac chest pain. She has nonobstructive coronary artery disease which was evaluated by cardiology. She has had a gallbladder workup consisting of 2 negative abdominal ultrasounds and normal HIDA scan with CCK. 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 biopsies showed normal mucosa. A barium swallow showed trace laryngeal penetration without aspiration, normal primary peristalsis with several tertiary contractions as well as a tiny amount of gastroesophageal reflux. She has had marked improvement in her condition. Her last chest pain occurred 3 weeks ago. She has discontinued all her GI medications.except for Nexiem. She has a history of rectal spasm and rectal pain for which she takes Diltiazem cream.   Past Medical History  Diagnosis Date  . Coronary artery disease   . GERD (gastroesophageal reflux disease)   . IBS (irritable bowel syndrome)   . PONV (postoperative nausea and vomiting)   . Fatty liver 04/28/12  . Esophageal spasm   . Esophageal motility disorder   . CVA (cerebral infarction)   . Hyperlipidemia   . Hypertension    Past Surgical History  Procedure Date  . Back surgery   . Abdominal hysterectomy   . Breast lumpectomy     right  . Ovarian cyst removal   . Esophagogastroduodenoscopy 04/30/2012    Procedure: ESOPHAGOGASTRODUODENOSCOPY (EGD);  Surgeon: Hart Carwin, MD;  Location: [facility];  Service: Endoscopy;  Laterality: N/A;  . Appendectomy     reports that she has quit smoking. Her smoking use included Cigarettes. She has never used smokeless tobacco. She reports that she drinks alcohol. She reports that she does not use illicit drugs. family history includes Colon polyps in her mother; Diverticulosis in her father; and Heart disease in her brother,  father, and mother.  There is no history of Colon cancer. Allergies  Allergen Reactions  . Clindamycin/Lincomycin Other (See Comments)    Reaction unknown  . Doxycycline Nausea Only  . Escitalopram Oxalate Nausea Only  . Hydrocodone Itching  . Levsin (Hyoscyamine Sulfate) Hives and Swelling  . Macrodantin Other (See Comments)    Flushed, hot  . Trazodone And Nefazodone Nausea Only        Review of Systems: Denies heartburn dysphagia odynophagia  The remainder of the 10 point ROS is negative except as outlined in H&P   Physical Exam: General appearance  Well developed, in no distress. Eyes- non icteric. HEENT nontraumatic, normocephalic. Mouth no lesions, tongue papillated, no cheilosis. Neck supple without adenopathy, thyroid not enlarged, no carotid bruits, no JVD. Lungs Clear to auscultation bilaterally. Cor normal S1, normal S2, regular rhythm, no murmur,  quiet precordium. Abdomen: Soft minimal tenderness in epigastrium. Normal active bowel sounds. No distention. Rectal: Not done. Extremities no pedal edema. Skin no lesions. Neurological alert and oriented x 3. Psychological normal mood and affect.  Assessment and Plan:  Problem #59 62 year old white female with a history of esophageal spasm and mild esophageal dysmotility. She is currently doing well on Nexium and takes Bentyl and Librax on an as necessary basis. She also has added Ativan and Xanax. No further evaluation is planned at this time.  Problem #2 nonobstructive CAD- followed by cardiology, non-cardiac chest pain Problem #3- Proctalgia  Fugax- Refill Diltiazem 2% gel.   06/16/2012 [name]

## 2012-07-27 ENCOUNTER — Telehealth: Payer: Self-pay | Admitting: Internal Medicine

## 2012-07-27 MED ORDER — PROMETHAZINE HCL 12.5 MG PO TABS: ORAL_TABLET | ORAL | Status: DC

## 2012-07-27 NOTE — Telephone Encounter (Signed)
Phenergan 12.5 mg, #10, 1 po q 4-6 hrs prn nausea, no refill

## 2012-07-27 NOTE — Telephone Encounter (Signed)
Patient states she has a stomach bug for 4 days. She has called her PCP and has appointment this afternoon. She is having nausea, headache and body aches. She is asking if Dr. Juanda Chance would call her something in for nausea. Please, advise.

## 2012-07-27 NOTE — Telephone Encounter (Signed)
Rx sent. Patient notified. 

## 2012-08-25 ENCOUNTER — Ambulatory Visit: Payer: BC Managed Care – PPO | Admitting: Physical Therapy

## 2012-12-21 ENCOUNTER — Other Ambulatory Visit: Payer: Self-pay

## 2012-12-21 DIAGNOSIS — Z1231 Encounter for screening mammogram for malignant neoplasm of breast: Secondary | ICD-10-CM

## 2013-02-11 ENCOUNTER — Ambulatory Visit
Admission: RE | Admit: 2013-02-11 | Discharge: 2013-02-11 | Disposition: A | Discharge status: Home / Self Care | Payer: BC Managed Care – PPO | Source: Ambulatory Visit

## 2013-02-11 DIAGNOSIS — Z1231 Encounter for screening mammogram for malignant neoplasm of breast: Secondary | ICD-10-CM

## 2013-03-22 ENCOUNTER — Telehealth: Payer: Self-pay | Admitting: Internal Medicine

## 2013-03-22 NOTE — Telephone Encounter (Signed)
Pt reports BRB on the tissue and in the bowel after her BM this am. She did not strain, it came out easily. She states she has hemorrhoids but they don't usually bleed. She was offered an appt next week with Dr Juanda Chance and asked to monitor the bleeding, but she prefers to see Doug Sou, PA in the am

## 2013-03-23 ENCOUNTER — Encounter: Payer: Self-pay | Admitting: Internal Medicine

## 2013-03-23 ENCOUNTER — Ambulatory Visit (INDEPENDENT_AMBULATORY_CARE_PROVIDER_SITE_OTHER): Payer: BC Managed Care – PPO | Admitting: Gastroenterology

## 2013-03-23 ENCOUNTER — Encounter: Payer: Self-pay | Admitting: Gastroenterology

## 2013-03-23 ENCOUNTER — Other Ambulatory Visit (INDEPENDENT_AMBULATORY_CARE_PROVIDER_SITE_OTHER): Payer: BC Managed Care – PPO

## 2013-03-23 VITALS — BP 98/64 | HR 64 | Ht 62.5 in | Wt 124.0 lb

## 2013-03-23 DIAGNOSIS — R194 Change in bowel habit: Secondary | ICD-10-CM

## 2013-03-23 DIAGNOSIS — R198 Other specified symptoms and signs involving the digestive system and abdomen: Secondary | ICD-10-CM

## 2013-03-23 DIAGNOSIS — R109 Unspecified abdominal pain: Secondary | ICD-10-CM | POA: Insufficient documentation

## 2013-03-23 DIAGNOSIS — K625 Hemorrhage of anus and rectum: Secondary | ICD-10-CM

## 2013-03-23 LAB — IGA: IgA: 177 mg/dL (ref 68–378)

## 2013-03-23 MED ORDER — MOVIPREP 100 G PO SOLR: 1.0000 | Freq: Once | ORAL | Status: DC

## 2013-03-23 MED ORDER — HYDROCORTISONE ACETATE 25 MG RE SUPP: RECTAL | Status: DC

## 2013-03-23 NOTE — Patient Instructions (Signed)
You have been scheduled for a colonoscopy with propofol. Please follow written instructions given to you at your visit today.  Please pick up your prep kit at the pharmacy within the next 1-3 days.[facility] [address]  If you use inhalers (even only as needed), please bring them with you on the day of your procedure.

## 2013-03-23 NOTE — Progress Notes (Signed)
Reviewed and agree.

## 2013-03-23 NOTE — Progress Notes (Signed)
03/23/2013 [name] [id number] May 25, 1951   History of Present Illness:  This is a pleasant 62 year old female who is known to Dr. Juanda Chance.  Her last colonoscopy was in December of 2006 at which time the exam was normal, and it was recommended that she have a repeat colonoscopy in 10 years form that time.    She comes in today with regards to an episode of rectal bleeding that she had yesterday. This is described as bright red blood on the toilet tissue and in the toilet bowl. This is the only evidence of bleeding; it resolved without intervention.  She usually do not see any blood in her stools, however, does have intermittent rectal discomfort, which she contributes to rectal spasms and has been treated with diltiazem 2% gel as needed in the past. When she had the bleeding she used a hydrocortisone suppository, which she had left over from several years ago and had been expired.  She does have a history of irritable bowel syndrome. Recently, over the last few months, she has noticed a change in her bowels with frequent loose stools. She is often afraid to eat in public and will take an antidiarrheal if she eats at a restaurant. Has lower abdominal discomfort, and her stomach "just doesn't feel right". She has Bentyl 10 mg to take for her IBS, but it obviously has not made much of a difference in her symptoms recently.  She cannot identify any particular food that worsen or trigger her symptoms, however, she has considered that gluten/wheat may have been the culprits at times.  There are no reports of nausea, vomiting, or weight loss.    Current Medications, Allergies, Past Medical History, Past Surgical History, Family History and Social History were reviewed in [employer] record.   Physical Exam: BP 98/64  Pulse 64  Ht 5' 2.5" (1.588 m)  Wt 56.246 kg (124 lb)  BMI 22.3 kg/m2 General: Well developed, white female in no acute distress Head: Normocephalic and  atraumatic Eyes:  Sclerae anicteric, conjunctiva pink  Ears: Normal auditory acuity Lungs: Clear throughout to auscultation Heart: Regular rate and rhythm Abdomen: Soft, non-distended.  BS present.  Mild lower abdominal TTP without R/R/G. Rectal:  External hemorrhoids note.  No masses felt on DRE.  Brown stool noted on exam glove. Musculoskeletal: Symmetrical with no gross deformities  Extremities: No edema  Neurological: Alert oriented x 4, grossly nonfocal Psychological:  Alert and cooperative. Normal mood and affect  Assessment and Recommendations: -Rectal bleeding and rectal discomfort:  One small episode of self-limited rectal bleeding.  Most likely secondary to hemorrhoids. Her last colonoscopy was almost 8 years ago, however. -Change in bowel habits with frequent loose stools and abdominal discomfort:  This may very well be secondary to IBS, and she does carry the diagnosis.  *With the combination of the above symptoms and the extended period of time since her last colonoscopy, we will schedule her for a repeat colonoscopy with Dr. Juanda Chance.   *We'll check celiac labs to rule that out as a cause of her symptoms. *We will refill her hydrocortisone suppositories, and she can use as at bedtime as needed.

## 2013-03-24 LAB — T-TRANSGLUTAMINASE (TTG) IGG: Tissue Transglut Ab: 4 U/mL (ref 0–5)

## 2013-04-07 ENCOUNTER — Encounter: Payer: Self-pay | Admitting: Internal Medicine

## 2013-04-07 ENCOUNTER — Ambulatory Visit (AMBULATORY_SURGERY_CENTER): Payer: BC Managed Care – PPO | Admitting: Internal Medicine

## 2013-04-07 VITALS — BP 128/71 | HR 57 | Temp 96.8°F | Resp 22 | Ht 62.0 in | Wt 124.0 lb

## 2013-04-07 DIAGNOSIS — K625 Hemorrhage of anus and rectum: Secondary | ICD-10-CM

## 2013-04-07 DIAGNOSIS — R198 Other specified symptoms and signs involving the digestive system and abdomen: Secondary | ICD-10-CM

## 2013-04-07 MED ORDER — SODIUM CHLORIDE 0.9 % IV SOLN: 500.0000 mL | INTRAVENOUS | Status: DC

## 2013-04-07 MED ORDER — HYDROCORTISONE ACETATE 25 MG RE SUPP: 25.0000 mg | Freq: Every evening | RECTAL | Status: DC | PRN

## 2013-04-07 NOTE — Patient Instructions (Signed)

## 2013-04-07 NOTE — Op Note (Signed)
[facility] [address] [place], [address]   COLONOSCOPY PROCEDURE REPORT  PATIENT: [name], [name]  MR#: [id number] BIRTHDATE: 1951-05-20 , 62  yrs. old GENDER: Female ENDOSCOPIST: Hart Carwin, MD REFERRED BY:  Elby Showers, M.D. PROCEDURE DATE:  04/07/2013 PROCEDURE:   Colonoscopy, diagnostic ASA CLASS:   Class II INDICATIONS:hematochezia.,last colon 05/2005, family hx of colon polyps MEDICATIONS: MAC sedation, administered by CRNA and propofol (Diprivan) 200mg  IV  DESCRIPTION OF PROCEDURE:   After the risks and benefits and of the procedure were explained, informed consent was obtained.  A digital rectal exam revealed no abnormalities of the rectum.    The LB [id number]  endoscope was introduced through the anus and advanced to the cecum, which was identified by both the appendix and ileocecal valve .  The quality of the prep was excellent, using MoviPrep .  The instrument was then slowly withdrawn as the colon was fully examined.     COLON FINDINGS: Small internal hemorrhoids were found. Retroflexed views revealed no abnormalities.     The scope was then withdrawn from the patient and the procedure completed.  COMPLICATIONS: There were no complications. ENDOSCOPIC IMPRESSION: Small internal hemorrhoids  RECOMMENDATIONS: 1.  High fiber diet 2.   continue Dicyclomine 10 mg po tid prn 3.  Anusol HC supp 1 hs prn 4. recall colon 10 years  REPEAT EXAM:  cc:  _______________________________ eSignedHart Carwin, MD 04/07/2013 8:25 AM

## 2013-04-07 NOTE — Progress Notes (Signed)
Patient did not experience any of the following events: a burn prior to discharge; a fall within the facility; wrong site/side/patient/procedure/implant event; or a hospital transfer or hospital admission upon discharge from the facility. (G8907) Patient did not have preoperative order for IV antibiotic SSI prophylaxis. (G8918)  

## 2013-04-08 ENCOUNTER — Telehealth: Payer: Self-pay | Admitting: *Deleted

## 2013-04-08 NOTE — Telephone Encounter (Signed)
  Follow up Call-  Call back number 04/07/2013  Post procedure Call Back phone  # [phone]  Permission to leave phone message Yes     Message states that the phone does not accept incoming calls.

## 2013-05-18 ENCOUNTER — Telehealth: Payer: Self-pay | Admitting: Interventional Cardiology

## 2013-05-18 NOTE — Telephone Encounter (Signed)
° °  Pt is at [facility] and she needs permissions from Dr. Katrinka Blazing to take celebrex. Pt is requesting a call back.

## 2013-05-18 NOTE — Telephone Encounter (Signed)
lmom for pt to return call. °

## 2013-05-19 NOTE — Telephone Encounter (Signed)
Follow Up ° °Pt returned call. Please assist  °

## 2013-05-19 NOTE — Telephone Encounter (Signed)
celebrex as it could increase the pt angina. if pt is experiencing alot of musculoskeletal pain pt needs decide if possible side effect is worth it.pt verbalized understanding

## 2013-06-15 IMAGING — RF DG UGI W/ KUB
14 of 24 series · 14 of 24 positions shown · non-contrast
Comparison: 07/16/2005 CT

CLINICAL DATA: 61-year-old female with epigastric pain and
occasional dysphagia.

UPPER GI SERIES WITH KUB
TECHNIQUE: Routine upper GI series was performed with thin and
high density barium as well as a barium tablet
Fluoroscopy Time: 2.22 minutes

[Series 1: run · 1 of 13 slices shown (1 of 14)]
[im 1/13]
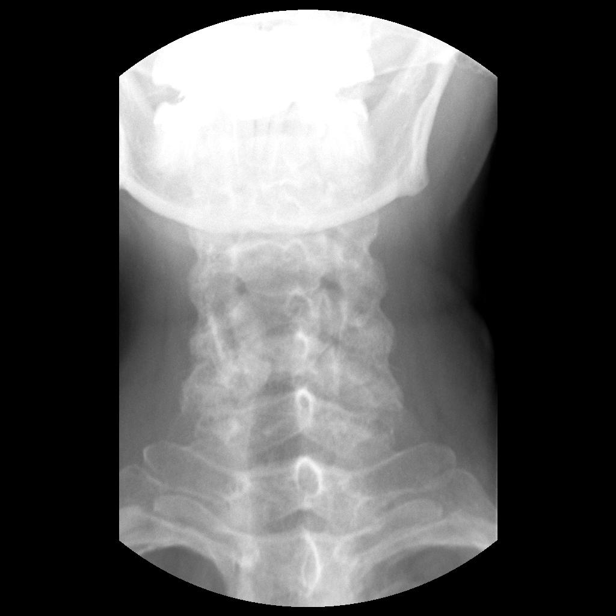

[Series 3: run · 1 of 1 slices shown (2 of 14)]
[im 1/1]
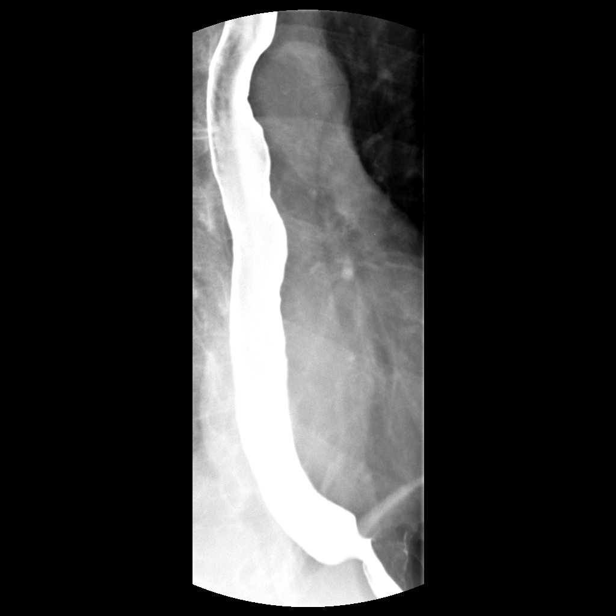

[Series 5: run · 1 of 1 slices shown (3 of 14)]
[im 1/1]
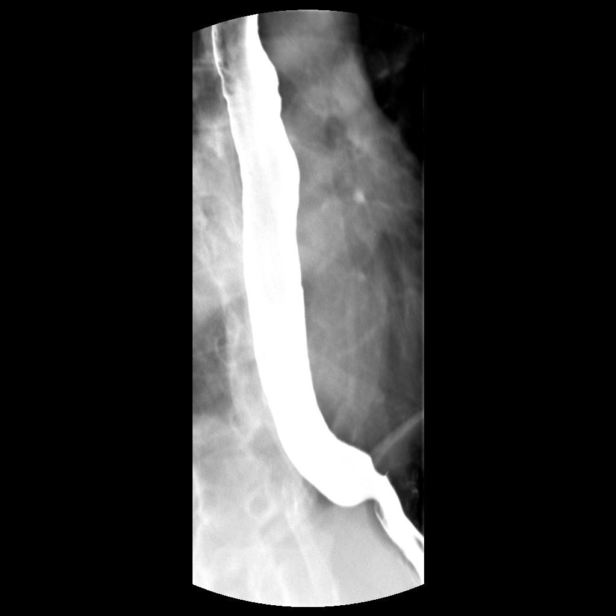

[Series 7: run · 1 of 1 slices shown (4 of 14)]
[im 1/1]
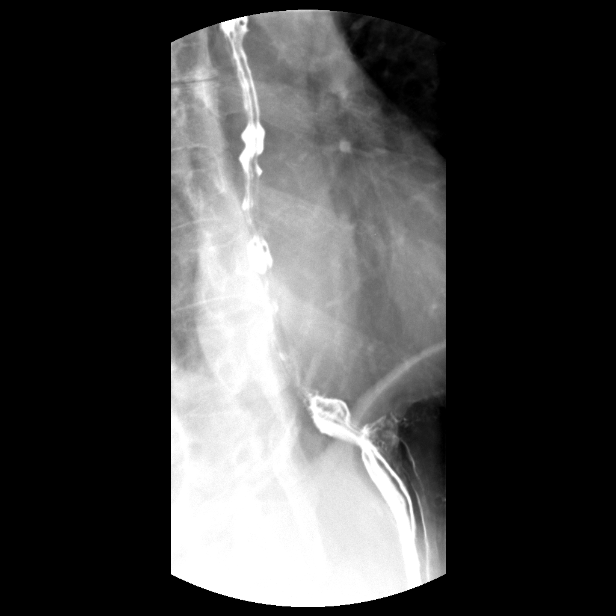

[Series 8: run · 1 of 1 slices shown (5 of 14)]
[im 1/1]
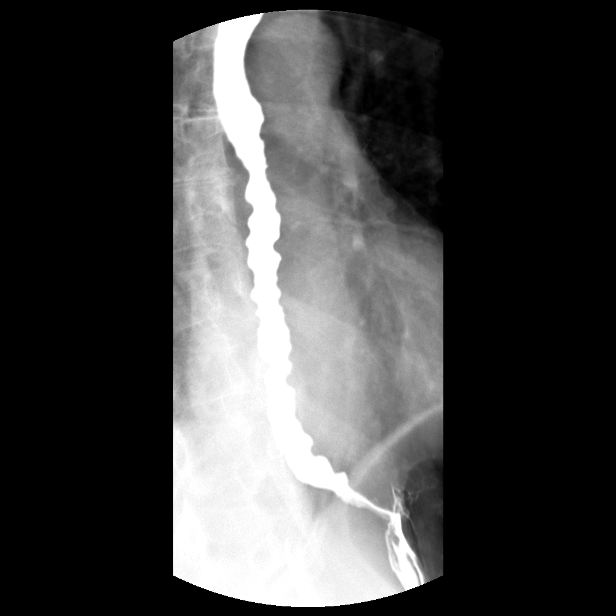

[Series 10: run · 1 of 1 slices shown (6 of 14)]
[im 1/1]
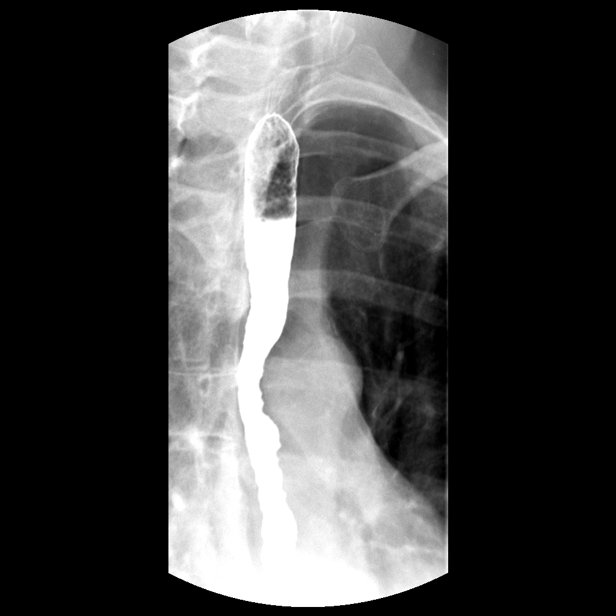

[Series 12: run · 1 of 1 slices shown (7 of 14)]
[im 1/1]
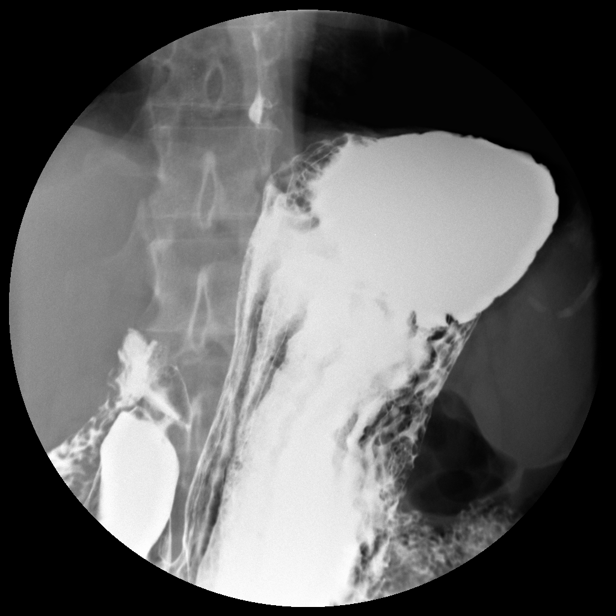

[Series 13: run · 1 of 1 slices shown (8 of 14)]
[im 1/1]
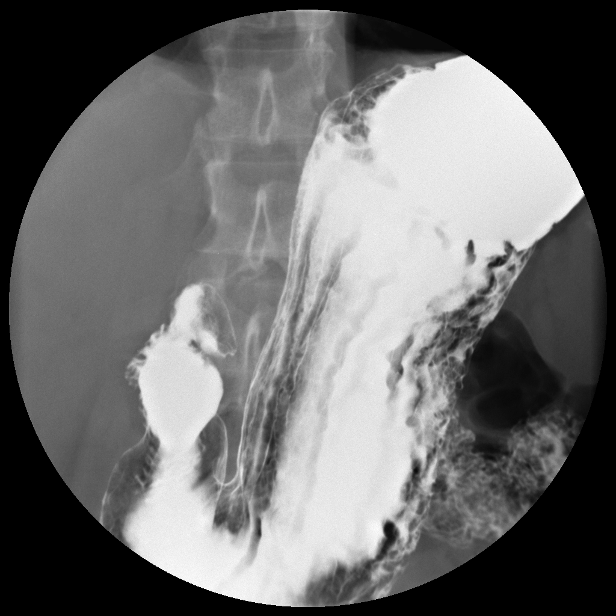

[Series 15: run · 1 of 1 slices shown (9 of 14)]
[im 1/1]
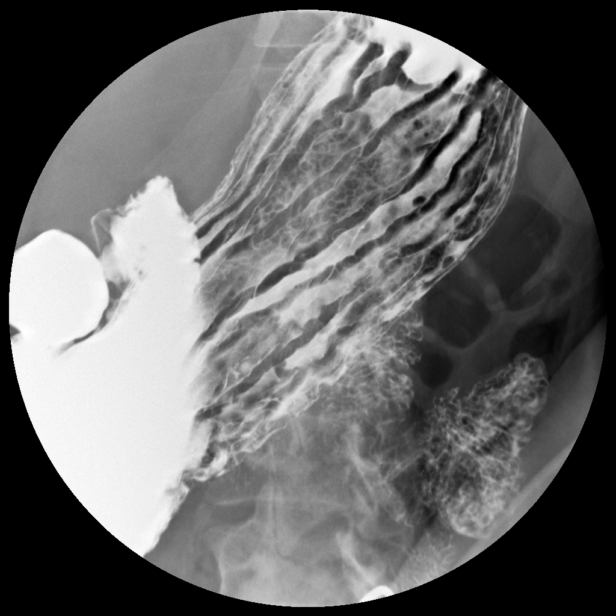

[Series 17: run · 1 of 1 slices shown (10 of 14)]
[im 1/1]
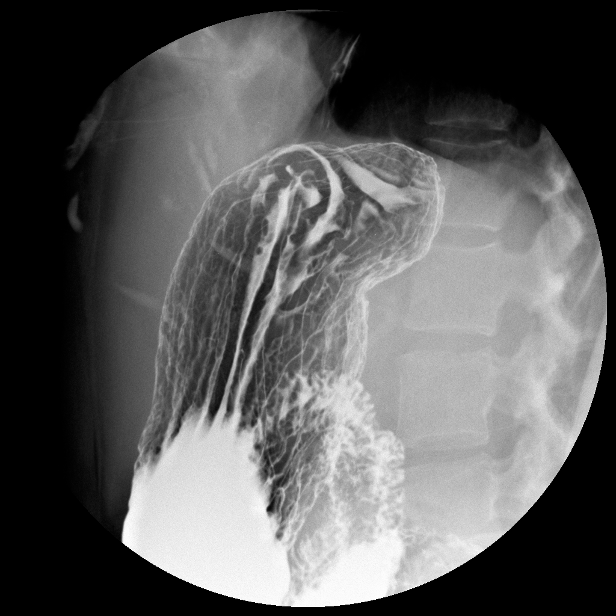

[Series 19: run · 1 of 1 slices shown (11 of 14)]
[im 1/1]
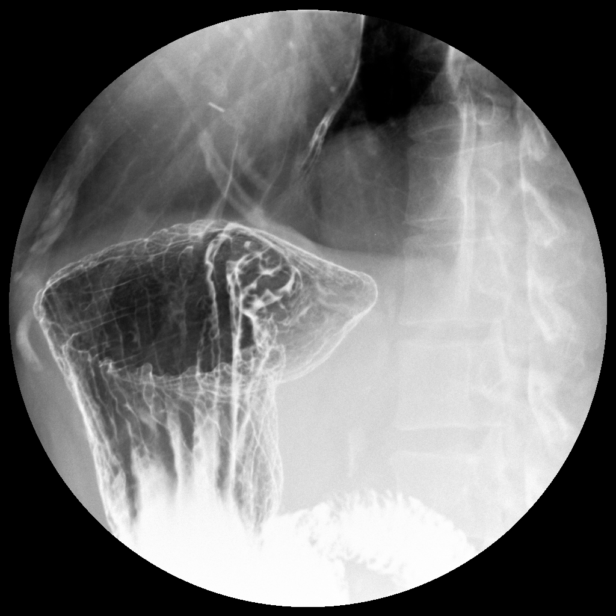

[Series 20: run · 1 of 1 slices shown (12 of 14)]
[im 1/1]
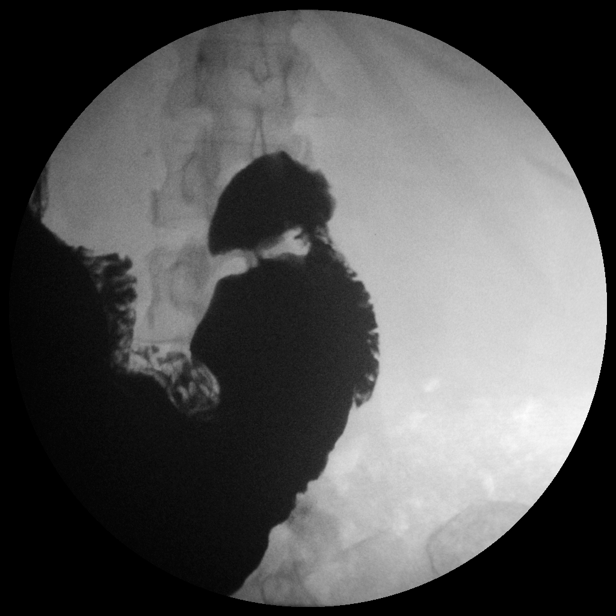

[Series 22: run · 1 of 1 slices shown (13 of 14)]
[im 1/1]
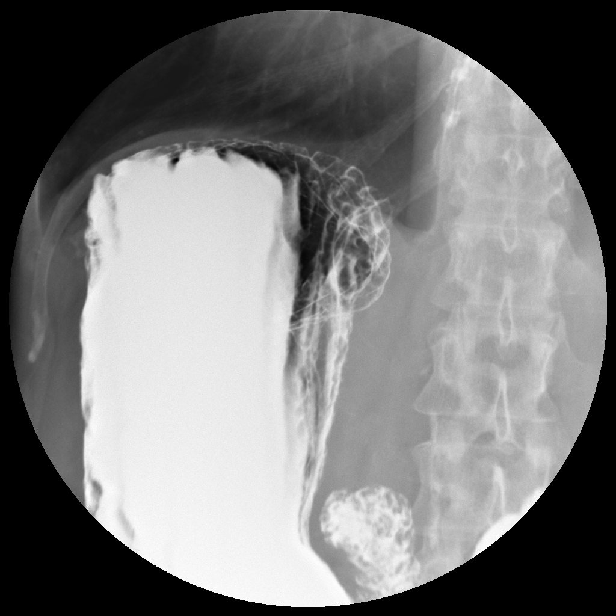

[Series 24: run · 1 of 1 slices shown (14 of 14)]
[im 1/1]
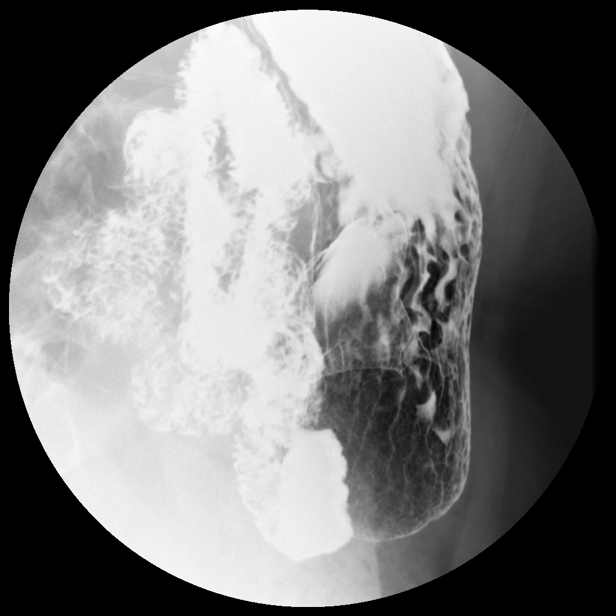

[14 of 24 positions shown; findings below may reference images not displayed]

FINDINGS: Scout film of the abdomen demonstrates unremarkable bowel
gas pattern.

The pharynx is unremarkable.

Trace laryngeal penetration is noted without evidence of
aspiration.

Normal primary esophageal contractions are noted.
On several swallows, tertiary contractions are present.

There are no fixed filling defects, mucosal abnormalities or areas
of fixed narrowing within the esophagus.
A barium tablet passes through the esophagus into the stomach
without obstruction.

The stomach and proximal duodenum are unremarkable.
No fixed filling defects or areas of fixed narrowing within the
stomach noted.
A tiny amount of gastroesophageal reflux is noted during the study.
There is no evidence of hiatal hernia.
IMPRESSION: Nonspecific esophageal motility disorder with tertiary contractions
noted.

Tiny amount of gastroesophageal reflux noted during this study.

Trace laryngeal penetration without aspiration.

## 2013-09-03 ENCOUNTER — Other Ambulatory Visit: Payer: Self-pay | Admitting: Interventional Cardiology

## 2013-11-16 ENCOUNTER — Encounter: Payer: Self-pay | Admitting: Interventional Cardiology

## 2013-12-17 ENCOUNTER — Ambulatory Visit: Payer: BC Managed Care – PPO | Admitting: Interventional Cardiology

## 2013-12-28 ENCOUNTER — Encounter (INDEPENDENT_AMBULATORY_CARE_PROVIDER_SITE_OTHER): Payer: Self-pay

## 2013-12-28 ENCOUNTER — Ambulatory Visit (INDEPENDENT_AMBULATORY_CARE_PROVIDER_SITE_OTHER): Payer: BC Managed Care – PPO | Admitting: Interventional Cardiology

## 2013-12-28 ENCOUNTER — Encounter: Payer: Self-pay | Admitting: Interventional Cardiology

## 2013-12-28 VITALS — BP 120/78 | HR 67 | Ht 62.0 in | Wt 126.1 lb

## 2013-12-28 DIAGNOSIS — I10 Essential (primary) hypertension: Secondary | ICD-10-CM

## 2013-12-28 DIAGNOSIS — I2510 Atherosclerotic heart disease of native coronary artery without angina pectoris: Secondary | ICD-10-CM

## 2013-12-28 DIAGNOSIS — E785 Hyperlipidemia, unspecified: Secondary | ICD-10-CM

## 2013-12-28 DIAGNOSIS — I6350 Cerebral infarction due to unspecified occlusion or stenosis of unspecified cerebral artery: Secondary | ICD-10-CM

## 2013-12-28 NOTE — Patient Instructions (Signed)
Your physician recommends that you continue on your current medications as directed. Please refer to the Current Medication list given to you today.  Your physician wants you to follow-up in: 1 year with Dr.Smith You will receive a reminder letter in the mail two months in advance. If you don't receive a letter, please call our office to schedule the follow-up appointment.  

## 2013-12-28 NOTE — Progress Notes (Signed)
Patient ID: [name], female   DOB: 05/17/1951, 63 y.o.   MRN: [id number]    [address] [place]  [address] Phone: [phone] Fax:  [phone]  Date:  12/28/2013   ID:  [name], DOB [birth year], MRN [id number]  PCP:  Myrtis Ser, MD   ASSESSMENT:  1. Coronary artery disease with angina, stable 2. Hypertension 3. Hyperlipidemia 4. History of CVA  PLAN:  1. Continue active lifestyle 2. One-year followup 3. No change in medical regimen   SUBJECTIVE: [name] is a 63 y.o. female who is doing well from cardiac standpoint. No significant episodes of angina. She has significant fatigue after exercise. She is exercising several times per week without angina. She's had some difficulty with her joints.   Wt Readings from Last 3 Encounters:  12/28/13 126 lb 1.9 oz (57.208 kg)  04/07/13 124 lb (56.246 kg)  03/23/13 124 lb (56.246 kg)     Past Medical History  Diagnosis Date  . Coronary artery disease   . GERD (gastroesophageal reflux disease)   . IBS (irritable bowel syndrome)     Diarrhea predominent, esophageal spasm severe, GERD - Dr. Elicia Lamp  . PONV (postoperative nausea and vomiting)   . Fatty liver 04/28/12  . Esophageal spasm   . Esophageal motility disorder   . CVA (cerebral infarction)   . Hyperlipidemia   . Essential hypertension, benign   . Coronary atherosclerosis     Cath 11/2006 demonstrating moderate obstructive coronary disease with 70-80% septal perforator #1, 50-70% first diagonal, 80% third diagonal, 50% mid and distal LAD & heavy calcification throughout all 3 coronaries. LVF normal.  . Interstitial cystitis     Dr. Gaynelle Arabian  . Memory loss     Psychiatry Dr. Stan Head, concerns for memory loss, anxiety  . Anxiety     Psychiatry Dr. Stan Head, concerns for memory loss, anxiety  . Coronary atherosclerosis of native coronary artery     Diffuse coronary atherosclerosis documented by previous  catheterization  . Angina, class III     Angina on effort, stable for the past 3-5 years (as of 12/09/12)  . Left shoulder pain     s/p shoulder arthroscopy  . Chest pain radiating to jaw     Stable, with frequent episodes, responsive to SL NTG and or ativan.    Current Outpatient Prescriptions  Medication Sig Dispense Refill  . acyclovir (ZOVIRAX) 400 MG tablet Take 400 mg by mouth 2 (two) times daily.      Marland Kitchen ALPRAZolam (XANAX) 0.25 MG tablet Take 0.25 mg by mouth as needed.      Marland Kitchen aspirin 81 MG tablet Take 81 mg by mouth at bedtime.       Marland Kitchen atorvastatin (LIPITOR) 20 MG tablet Take 20 mg by mouth at bedtime.       . Cholecalciferol (VITAMIN D) 1000 UNITS capsule Take 1,000 Units by mouth daily.      . clidinium-chlordiazePOXIDE (LIBRAX) 2.5-5 MG per capsule Take 1 capsule by mouth 3 (three) times daily as needed. For esophagus      . CLIMARA 0.0375 MG/24HR Place 1 patch onto the skin once a week.       . dicyclomine (BENTYL) 10 MG capsule Take 10 mg by mouth 3 (three) times daily as needed. For IBS      . Digestive Enzymes (ACIDOLL) CAPS Take 1 capsule by mouth. Taking one 300 mg capsule per day      .  diltiazem 2 % GEL Apply 1 application topically as needed. For rectal spasm      . doxylamine, Sleep, (UNISOM) 25 MG tablet Take 25 mg by mouth at bedtime as needed.      Marland Kitchen esomeprazole (NEXIUM) 40 MG capsule Take 40 mg by mouth daily before breakfast.      . hydrocortisone (ANUSOL-HC) 25 MG suppository Place 1 suppository (25 mg total) rectally at bedtime as needed for hemorrhoids.  12 suppository  1  . hydrOXYzine (ATARAX/VISTARIL) 25 MG tablet Take 25 mg by mouth as needed.      Marland Kitchen LORazepam (ATIVAN) 0.5 MG tablet Take one TID sublingually prn chest pain  30 tablet  0  . Methylfol-Methylcob-Acetylcyst (METAFOLBIC PLUS) 6-2-600 MG TABS Take 1 tablet by mouth every morning.       . Misc Natural Products (TART CHERRY ADVANCED PO) Take 1,200 mg by mouth daily.      . nitroGLYCERIN (NITROSTAT)  0.4 MG SL tablet Place 0.4 mg under the tongue every 5 (five) minutes as needed. For chest pain      . oxyCODONE-acetaminophen (PERCOCET/ROXICET) 5-325 MG per tablet Take 1 tablet by mouth every 4 (four) hours as needed. For pain      . RANEXA 500 MG 12 hr tablet take 1 tablet by mouth twice a day for 30 DAYS  60 tablet  6  . temazepam (RESTORIL) 15 MG capsule Take 1 capsule by mouth once a week. Does not take temazepam on a specific day      . Turmeric 450 MG CAPS Take 1 capsule by mouth at bedtime.       No current facility-administered medications for this visit.    Allergies:    Allergies  Allergen Reactions  . Clindamycin/Lincomycin Other (See Comments)    Reaction unknown  . Doxycycline Nausea Only  . Escitalopram Oxalate Nausea Only  . Hydrocodone Itching  . Levsin [Hyoscyamine Sulfate] Hives and Swelling  . Macrodantin Other (See Comments)    Flushed, hot  . Trazodone And Nefazodone Nausea Only  . Trimethoprim     Social History:  The patient  reports that she quit smoking about 40 years ago. Her smoking use included Cigarettes. She smoked 0.00 packs per day. She has never used smokeless tobacco. She reports that she drinks alcohol. She reports that she does not use illicit drugs.   ROS:  Please see the history of present illness.   No syncope, claudication, palpitations, dyspnea, orthopnea.   All other systems reviewed and negative.   OBJECTIVE: VS:  BP 120/78  Pulse 67  Ht 5\' 2"  (1.575 m)  Wt 126 lb 1.9 oz (57.208 kg)  BMI 23.06 kg/m2 Well nourished, well developed, in no acute distress, younger than stated age 41: normal Neck: JVD flat. Carotid bruit absent  Cardiac:  normal S1, S2; RRR; no murmur Lungs:  clear to auscultation bilaterally, no wheezing, rhonchi or rales Abd: soft, nontender, no hepatomegaly Ext: Edema absent. Pulses 2+ Skin: warm and dry Neuro:  CNs 2-12 intact, no focal abnormalities noted  EKG:  Normal       Signed, Illene Labrador  III, MD 12/28/2013 1:31 PM

## 2014-01-03 ENCOUNTER — Encounter (HOSPITAL_BASED_OUTPATIENT_CLINIC_OR_DEPARTMENT_OTHER): Payer: Self-pay | Admitting: *Deleted

## 2014-01-03 ENCOUNTER — Other Ambulatory Visit: Payer: Self-pay | Admitting: Urology

## 2014-01-06 ENCOUNTER — Other Ambulatory Visit: Payer: Self-pay | Admitting: Urology

## 2014-01-10 ENCOUNTER — Encounter (HOSPITAL_BASED_OUTPATIENT_CLINIC_OR_DEPARTMENT_OTHER): Payer: Self-pay | Admitting: *Deleted

## 2014-01-10 NOTE — Progress Notes (Signed)
NPO AFTER MN. ARRIVE AT 0700. NEEDS ISTAT . CURRENT EKG IN CHART AND EPIC. WILL TAKE RANEXA AND NEXIUM AM DOS W/ SIPS OF WATER.

## 2014-01-14 ENCOUNTER — Encounter (HOSPITAL_BASED_OUTPATIENT_CLINIC_OR_DEPARTMENT_OTHER): Payer: Self-pay | Admitting: Anesthesiology

## 2014-01-14 ENCOUNTER — Ambulatory Visit (HOSPITAL_BASED_OUTPATIENT_CLINIC_OR_DEPARTMENT_OTHER)
Admission: RE | Admit: 2014-01-14 | Discharge: 2014-01-14 | Disposition: A | Discharge status: Home / Self Care | Payer: BC Managed Care – PPO | Source: Ambulatory Visit | Attending: Urology | Admitting: Urology

## 2014-01-14 ENCOUNTER — Encounter (HOSPITAL_BASED_OUTPATIENT_CLINIC_OR_DEPARTMENT_OTHER)
Admission: RE | Disposition: A | Discharge status: Home / Self Care | Payer: Self-pay | Source: Ambulatory Visit | Attending: Urology

## 2014-01-14 ENCOUNTER — Encounter (HOSPITAL_BASED_OUTPATIENT_CLINIC_OR_DEPARTMENT_OTHER): Payer: BC Managed Care – PPO | Admitting: Anesthesiology

## 2014-01-14 ENCOUNTER — Ambulatory Visit (HOSPITAL_BASED_OUTPATIENT_CLINIC_OR_DEPARTMENT_OTHER): Payer: BC Managed Care – PPO | Admitting: Anesthesiology

## 2014-01-14 DIAGNOSIS — N3010 Interstitial cystitis (chronic) without hematuria: Secondary | ICD-10-CM

## 2014-01-14 DIAGNOSIS — M109 Gout, unspecified: Secondary | ICD-10-CM | POA: Insufficient documentation

## 2014-01-14 DIAGNOSIS — Z8673 Personal history of transient ischemic attack (TIA), and cerebral infarction without residual deficits: Secondary | ICD-10-CM | POA: Insufficient documentation

## 2014-01-14 DIAGNOSIS — N949 Unspecified condition associated with female genital organs and menstrual cycle: Secondary | ICD-10-CM | POA: Insufficient documentation

## 2014-01-14 DIAGNOSIS — E7800 Pure hypercholesterolemia, unspecified: Secondary | ICD-10-CM | POA: Insufficient documentation

## 2014-01-14 DIAGNOSIS — J45909 Unspecified asthma, uncomplicated: Secondary | ICD-10-CM | POA: Diagnosis not present

## 2014-01-14 DIAGNOSIS — N3289 Other specified disorders of bladder: Secondary | ICD-10-CM | POA: Insufficient documentation

## 2014-01-14 DIAGNOSIS — M129 Arthropathy, unspecified: Secondary | ICD-10-CM | POA: Insufficient documentation

## 2014-01-14 DIAGNOSIS — K219 Gastro-esophageal reflux disease without esophagitis: Secondary | ICD-10-CM | POA: Insufficient documentation

## 2014-01-14 DIAGNOSIS — F411 Generalized anxiety disorder: Secondary | ICD-10-CM | POA: Diagnosis not present

## 2014-01-14 DIAGNOSIS — I10 Essential (primary) hypertension: Secondary | ICD-10-CM | POA: Insufficient documentation

## 2014-01-14 DIAGNOSIS — Z87891 Personal history of nicotine dependence: Secondary | ICD-10-CM | POA: Insufficient documentation

## 2014-01-14 HISTORY — DX: Personal history of transient ischemic attack (TIA), and cerebral infarction without residual deficits: Z86.73

## 2014-01-14 HISTORY — DX: Presence of spectacles and contact lenses: Z97.3

## 2014-01-14 HISTORY — DX: Personal history of other diseases of the digestive system: Z87.19

## 2014-01-14 HISTORY — PX: CYSTO WITH HYDRODISTENSION: SHX5453

## 2014-01-14 HISTORY — DX: Other chronic pain: G89.29

## 2014-01-14 HISTORY — DX: Cardiac murmur, unspecified: R01.1

## 2014-01-14 HISTORY — DX: Unspecified temporomandibular joint disorder, unspecified side: M26.609

## 2014-01-14 HISTORY — DX: Other symptoms and signs involving the genitourinary system: R39.89

## 2014-01-14 HISTORY — DX: Chest pain, unspecified: R07.9

## 2014-01-14 LAB — POCT I-STAT, CHEM 8
BUN: 13 mg/dL (ref 6–23)
Calcium, Ion: 1.26 mmol/L (ref 1.13–1.30)
Chloride: 103 mEq/L (ref 96–112)
Creatinine, Ser: 0.7 mg/dL (ref 0.50–1.10)
Glucose, Bld: 96 mg/dL (ref 70–99)
HCT: 41 % (ref 36.0–46.0)
Hemoglobin: 13.9 g/dL (ref 12.0–15.0)
Potassium: 3.9 mEq/L (ref 3.7–5.3)
Sodium: 141 mEq/L (ref 137–147)
TCO2: 25 mmol/L (ref 0–100)

## 2014-01-14 SURGERY — CYSTOSCOPY, WITH BLADDER HYDRODISTENSION
Anesthesia: General | Site: Bladder

## 2014-01-14 MED ORDER — FENTANYL CITRATE 0.05 MG/ML IJ SOLN
INTRAMUSCULAR | Status: DC | PRN
Start: 2014-01-14 — End: 2014-01-14
  Administered 2014-01-14: 50 ug via INTRAVENOUS

## 2014-01-14 MED ORDER — LACTATED RINGERS IV SOLN
INTRAVENOUS | Status: DC
  Filled 2014-01-14: qty 1000

## 2014-01-14 MED ORDER — TRIAMCINOLONE ACETONIDE 40 MG/ML IJ SUSP
INTRAMUSCULAR | Status: DC | PRN
  Administered 2014-01-14: 40 mg

## 2014-01-14 MED ORDER — LIDOCAINE HCL (CARDIAC) 20 MG/ML IV SOLN
INTRAVENOUS | Status: DC | PRN
  Administered 2014-01-14: 60 mg via INTRAVENOUS

## 2014-01-14 MED ORDER — FENTANYL CITRATE 0.05 MG/ML IJ SOLN
25.0000 ug | INTRAMUSCULAR | Status: DC | PRN
  Filled 2014-01-14: qty 1

## 2014-01-14 MED ORDER — ONDANSETRON HCL 4 MG/2ML IJ SOLN
INTRAMUSCULAR | Status: DC | PRN
  Administered 2014-01-14: 4 mg via INTRAVENOUS

## 2014-01-14 MED ORDER — PROPOFOL 10 MG/ML IV BOLUS
INTRAVENOUS | Status: DC | PRN
  Administered 2014-01-14: 150 mg via INTRAVENOUS

## 2014-01-14 MED ORDER — ACETAMINOPHEN 10 MG/ML IV SOLN
INTRAVENOUS | Status: DC | PRN
  Administered 2014-01-14: 1000 mg via INTRAVENOUS

## 2014-01-14 MED ORDER — STERILE WATER FOR IRRIGATION IR SOLN
Status: DC | PRN
  Administered 2014-01-14: 3000 mL via INTRAVESICAL

## 2014-01-14 MED ORDER — KETOROLAC TROMETHAMINE 30 MG/ML IJ SOLN
INTRAMUSCULAR | Status: DC | PRN
  Administered 2014-01-14: 30 mg via INTRAVENOUS

## 2014-01-14 MED ORDER — DEXAMETHASONE SODIUM PHOSPHATE 4 MG/ML IJ SOLN
INTRAMUSCULAR | Status: DC | PRN
  Administered 2014-01-14: 10 mg via INTRAVENOUS

## 2014-01-14 MED ORDER — FENTANYL CITRATE 0.05 MG/ML IJ SOLN
INTRAMUSCULAR | Status: AC
  Filled 2014-01-14: qty 4

## 2014-01-14 MED ORDER — PHENAZOPYRIDINE HCL 200 MG PO TABS
ORAL | Status: DC | PRN
  Administered 2014-01-14: 09:00:00 via INTRAVESICAL

## 2014-01-14 MED ORDER — CEFAZOLIN SODIUM-DEXTROSE 2-3 GM-% IV SOLR
2.0000 g | INTRAVENOUS | Status: AC
  Administered 2014-01-14: 2 g via INTRAVENOUS
  Filled 2014-01-14: qty 50

## 2014-01-14 MED ORDER — MIDAZOLAM HCL 5 MG/5ML IJ SOLN
INTRAMUSCULAR | Status: DC | PRN
  Administered 2014-01-14: 2 mg via INTRAVENOUS

## 2014-01-14 MED ORDER — LACTATED RINGERS IV SOLN
INTRAVENOUS | Status: DC
  Administered 2014-01-14 (×2): via INTRAVENOUS
  Filled 2014-01-14: qty 1000

## 2014-01-14 MED ORDER — CIPROFLOXACIN HCL 500 MG PO TABS: 500.0000 mg | ORAL_TABLET | Freq: Two times a day (BID) | ORAL | Status: DC

## 2014-01-14 MED ORDER — MIDAZOLAM HCL 2 MG/2ML IJ SOLN
INTRAMUSCULAR | Status: AC
  Filled 2014-01-14: qty 2

## 2014-01-14 MED ORDER — METOCLOPRAMIDE HCL 5 MG/ML IJ SOLN
INTRAMUSCULAR | Status: DC | PRN
  Administered 2014-01-14: 10 mg via INTRAVENOUS

## 2014-01-14 SURGICAL SUPPLY — 27 items
ADAPTER CATH WHT DISP STRL (CATHETERS) IMPLANT
ADPR CATH MP STRL LF DISP BD (CATHETERS)
BAG DRAIN URO-CYSTO SKYTR STRL (DRAIN) ×2 IMPLANT
BAG DRN UROCATH (DRAIN) ×1
BOOTIES KNEE HIGH SLOAN (MISCELLANEOUS) ×1 IMPLANT
CANISTER SUCT LVC 12 LTR MEDI- (MISCELLANEOUS) ×1 IMPLANT
CATH ROBINSON RED A/P 16FR (CATHETERS) IMPLANT
CLOTH BEACON ORANGE TIMEOUT ST (SAFETY) ×2 IMPLANT
DRAPE CAMERA CLOSED 9X96 (DRAPES) ×2 IMPLANT
ELECT REM PT RETURN 9FT ADLT (ELECTROSURGICAL) ×2
ELECTRODE REM PT RTRN 9FT ADLT (ELECTROSURGICAL) ×1 IMPLANT
GLOVE BIO SURGEON STRL SZ7.5 (GLOVE) ×2 IMPLANT
GLOVE ECLIPSE 8.0 STRL XLNG CF (GLOVE) ×1 IMPLANT
GOWN PREVENTION PLUS LG XLONG (DISPOSABLE) ×1 IMPLANT
GOWN STRL REIN XL XLG (GOWN DISPOSABLE) ×1 IMPLANT
GOWN STRL REUS W/ TWL XL LVL3 (GOWN DISPOSABLE) IMPLANT
GOWN STRL REUS W/TWL XL LVL3 (GOWN DISPOSABLE) ×4
NDL SAFETY ECLIPSE 18X1.5 (NEEDLE) ×1 IMPLANT
NDL SPNL 22GX7 QUINCKE BK (NEEDLE) IMPLANT
NEEDLE HYPO 18GX1.5 SHARP (NEEDLE) ×2
NEEDLE HYPO 22GX1.5 SAFETY (NEEDLE) ×1 IMPLANT
NEEDLE SPNL 22GX7 QUINCKE BK (NEEDLE) IMPLANT
NS IRRIG 500ML POUR BTL (IV SOLUTION) IMPLANT
PACK CYSTOSCOPY (CUSTOM PROCEDURE TRAY) ×2 IMPLANT
SYR 20CC LL (SYRINGE) ×4 IMPLANT
SYR BULB IRRIGATION 50ML (SYRINGE) IMPLANT
WATER STERILE IRR 3000ML UROMA (IV SOLUTION) ×2 IMPLANT

## 2014-01-14 NOTE — Anesthesia Preprocedure Evaluation (Addendum)
Anesthesia Evaluation  Patient identified by MRN, date of birth, ID band Patient awake    Reviewed: Allergy & Precautions, H&P , NPO status , Patient's Chart, lab work & pertinent test results  History of Anesthesia Complications (+) PONV  Airway Mallampati: II TM Distance: >3 FB Neck ROM: full    Dental no notable dental hx. (+) Teeth Intact, Dental Advisory Given   Pulmonary neg pulmonary ROS, former smoker,  breath sounds clear to auscultation  Pulmonary exam normal       Cardiovascular Exercise Tolerance: Good hypertension, + CAD Rhythm:regular Rate:Normal  moderat CAD on cath. Has CP   Neuro/Psych Anxiety Memory loss TIAnegative psych ROS   GI/Hepatic negative GI ROS, Neg liver ROS, GERD-  Medicated and Controlled,Dysphagia and esophageal spasm   Endo/Other  negative endocrine ROS  Renal/GU negative Renal ROS  negative genitourinary   Musculoskeletal   Abdominal   Peds  Hematology negative hematology ROS (+)   Anesthesia Other Findings TMJ syndrome  Reproductive/Obstetrics negative OB ROS                          Anesthesia Physical Anesthesia Plan  ASA: III  Anesthesia Plan: General   Post-op Pain Management:    Induction: Intravenous  Airway Management Planned: LMA  Additional Equipment:   Intra-op Plan:   Post-operative Plan:   Informed Consent: I have reviewed the patients History and Physical, chart, labs and discussed the procedure including the risks, benefits and alternatives for the proposed anesthesia with the patient or authorized representative who has indicated his/her understanding and acceptance.   Dental Advisory Given  Plan Discussed with: CRNA and Surgeon  Anesthesia Plan Comments:         Anesthesia Quick Evaluation

## 2014-01-14 NOTE — Interval H&P Note (Signed)
History and Physical Interval Note:  01/14/2014 8:38 AM  [name]  has presented today for surgery, with the diagnosis of interstitial cystitis  The various methods of treatment have been discussed with the patient and family. After consideration of risks, benefits and other options for treatment, the patient has consented to  Procedure(s): CYSTOSCOPY/HYDRODISTENSION/INJECTION OF MARCAINE AND PYRIDUIM/INJECTION OF MARCAINE AND KENALO, POSSIBLE BLADDER BIOPSY  (N/A) as a surgical intervention .  The patient's history has been reviewed, patient examined, no change in status, stable for surgery.  I have reviewed the patient's chart and labs.  Questions were answered to the patient's satisfaction.     Carolan Clines I

## 2014-01-14 NOTE — Op Note (Signed)
Pre-operative diagnosis :   Interstitial cystitis  Postoperative diagnosis:   Same  Operation: Cystourethroscopy, hydrodistention the bladder (900 cc) insertion of pretty Marcaine the bladder, injection of Marcaine Kenalog in the subtrigonal space.    Surgeon:  Chauncey Cruel. Gaynelle Arabian, MD  First assistant:  None   Anesthesia:  General LMA   Preparation: After appropriate preanesthesia, the patient was brought to the operating room, placed on the operating table in the dorsal supine position where general LMA anesthesia was introduced. She was then replaced in the dorsal lithotomy position with pubis was prepped with Betadine solution and draped in usual fashion. The arm band was double checked. History was double checked.   Review history:  Problems  1. Chronic interstitial cystitis without hematuria (595.1)   Assessed By: Carolan Clines (Urology); Last Assessed: 29 Dec 2013  2. Female pelvic pain (625.9)  History of Present Illness  63 year old female returns today for a 3 week f/u after taking Myrbetriq 50mg . She noted severe abdominal ain with Myrbetriq, with gas, cramping, and diarrhea-exacerbation of her IBS; which abated when she stopped the Myrbetriq. However, she noted a decrease in her urinary urgency and bladder s-p pressure.  Hx of IC, complaining of urgency and s-p pressure over the last 6 months.  She is s/p cysto/HOD on 03/12/10. She had a period after the surgery that she felt great, but then started having pelvic floor spasms for which she used Gelnique for a little while which did help. She is not currently using Gelnique because she notice her hair thinning, ( strong family history of alopecia) and was but seems to be doing well w/o the medication until December. She was 30-45% better than before her surgery. She states that she is no longer able to have intercourse w/o having pain. Current PUFF= 16  Hx of a 15 year history of urgency, frequency, nocturia, pressure, and abdominal  bloating. She had cystoscopy per Dr. Serita Butcher 15 years ago, and currently complains of frequency urgency and feeling of urinary tract infection. Her puff score is 21. Note her allergy to Levsinex and hyoscyamine. The patient complains of dyspareunia, urgency, and vaginal dryness. She believes she may have fibromyalgia, GERD with IBS, spastic colon, and depression. She is post hysterectomy. No migraines. + anxiety.      Statement of  Likelihood of Success: Excellent. TIME-OUT observed.:  Procedure:  Vaginal inspection revealed normal-appearing vagina, with normal estrogenization. The urethra was in normal position. There were no periurethral masses.  Cystourethroscopy was accomplished which showed normal urethra and normal bladder neck. The bladder was hydrodistended to 900 cc without difficulty. The trigone was normal. The ureteral orifices were normal position the trigone. There was no evidence of bladder stone, tumor, or diverticular formation. The bladder is drained of fluid, and repeat cystoscopy revealed multiple areas of ulceration, which were photo documented. This is consistent with the patient's history of interstitial cystitis.  The bladder is drained of fluid, and a 14 French red Robinson catheter was placed, and 30 cc of Marcaine Pyridium solution is inserted into the bladder.   The catheter was withdrawn, and Marcaine Kenalog solution, 10 cc, is injected into the subtrigonal periurethral space.  The patient was then awakened, after receiving IV Tylenol, and IV Toradol. She was taken to recovery room in good condition.

## 2014-01-14 NOTE — Anesthesia Procedure Notes (Signed)
Procedure Name: LMA Insertion Date/Time: 01/14/2014 8:37 AM Performed by: Mechele Claude Pre-anesthesia Checklist: Patient identified, Emergency Drugs available, Suction available and Patient being monitored Patient Re-evaluated:Patient Re-evaluated prior to inductionOxygen Delivery Method: Circle System Utilized Preoxygenation: Pre-oxygenation with 100% oxygen Intubation Type: IV induction Ventilation: Mask ventilation without difficulty LMA: LMA inserted LMA Size: 4.0 Number of attempts: 1 Airway Equipment and Method: bite block Placement Confirmation: positive ETCO2 Tube secured with: Tape Dental Injury: Teeth and Oropharynx as per pre-operative assessment

## 2014-01-14 NOTE — Discharge Instructions (Addendum)
Interstitial Cystitis Interstitial cystitis (IC) is a condition that results in discomfort or pain in the bladder and the surrounding pelvic region. The symptoms can be different from case to case and even in the same individual. People may experience:  Mild discomfort.  Pressure.  Tenderness.  Intense pain in the bladder and pelvic area. CAUSES  Because IC varies so much in symptoms and severity, people studying this disease believe it is not one but several diseases. Some caregivers use the term painful bladder syndrome (PBS) to describe cases with painful urinary symptoms. This may not meet the strictest definition of IC. The term IC / PBS includes all cases of urinary pain that cannot be connected to other causes, such as infection or urinary stones.  SYMPTOMS  Symptoms may include:  An urgent need to urinate.  A frequent need to urinate.  A combination of these symptoms. Pain may change in intensity as the bladder fills with urine or as it empties. Women's symptoms often get worse during menstruation. They may sometimes experience pain with vaginal intercourse. Some of the symptoms of IC / PBS seem like those of bacterial infection. Tests do not show infection. IC / PBS is far more common in women than in men.  DIAGNOSIS  The diagnosis of IC / PBS is based on:  Presence of pain related to the bladder, usually along with problems of frequency and urgency.  Not finding other diseases that could cause the symptoms.  Diagnostic tests that help rule out other diseases include:  Urinalysis.  Urine culture.  Cystoscopy.  Biopsy of the bladder wall.  Distension of the bladder under anesthesia.  Urine cytology.  Laboratory examination of prostate secretions. A biopsy is a tissue sample that can be looked at under a microscope. Samples of the bladder and urethra may be removed during a cystoscopy. A biopsy helps rule out bladder cancer. TREATMENT  Scientists have not yet found  a cure for IC / PBS. Patients with IC / PBS do not get better with antibiotic therapy. Caregivers cannot predict who will respond best to which treatment. Symptoms may disappear without explanation. Disappearing symptoms may coincide with an event such as a change in diet or treatment. Even when symptoms disappear, they may return after days, weeks, months, or years.  Because the causes of IC / PBS are unknown, current treatments are aimed at relieving symptoms. Many people are helped by one or a combination of the treatments. As researchers learn more about IC / PBS, the list of potential treatments will change. Patients should discuss their options with a caregiver. SURGERY  Surgery should be considered only if all available treatments have failed and the pain is disabling. Many approaches and techniques are used. Each approach has its own advantages and complications. Advantages and complications should be discussed with a urologist. Your caregiver may recommend consulting another urologist for a second opinion. Most caregivers are reluctant to operate because the outcome is unpredictable. Some people still have symptoms after surgery.  People considering surgery should discuss the potential risks and benefits, side effects, and long- and short-term complications with their family, as well as with people who have already had the procedure. Surgery requires anesthesia, hospitalization, and in some cases weeks or months of recovery. As the complexity of the procedure increases, so do the chances for complications and for failure. HOME CARE INSTRUCTIONS   All drugs, even those sold over the counter, have side effects. Patients should always consult a caregiver before using any  drug for an extended amount of time. Only take over-the-counter or prescription medicines for pain, discomfort, or fever as directed by your caregiver.  Many patients feel that smoking makes their symptoms worse. How the by-products  of tobacco that are excreted in the urine affect IC / PBS is unknown. Smoking is the major known cause of bladder cancer. One of the best things smokers can do for their bladder and their overall health is to quit.  Many patients feel that gentle stretching exercises help relieve IC / PBS symptoms.  Methods vary, but basically patients decide to empty their bladder at designated times and use relaxation techniques and distractions to keep to the schedule. Gradually, patients try to lengthen the time between scheduled voids. A diary in which to record voiding times is usually helpful in keeping track of progress. MAKE SURE YOU:   Understand these instructions.  Will watch your condition.  Will get help right away if you are not doing well or get worse. Document Released: 01/26/2004 Document Revised: 08/19/2011 Document Reviewed: 04/11/2008 [facility] Patient Information 2015 [place], [place]. This information is not intended to replace advice  Post Anesthesia Home Care Instructions  Activity: Get plenty of rest for the remainder of the day. A responsible adult should stay with you for 24 hours following the procedure.  For the next 24 hours, DO NOT: -Drive a car -Paediatric nurse -Drink alcoholic beverages -Take any medication unless instructed by your physician -Make any legal decisions or sign important papers.  Meals: Start with liquid foods such as gelatin or soup. Progress to regular foods as tolerated. Avoid greasy, spicy, heavy foods. If nausea and/or vomiting occur, drink only clear liquids until the nausea and/or vomiting subsides. Call your physician if vomiting continues.  Special Instructions/Symptoms: Your throat may feel dry or sore from the anesthesia or the breathing tube placed in your throat during surgery. If this causes discomfort, gargle with warm salt water. The discomfort should disappear within 24 hours. CYSTOSCOPY HOME CARE INSTRUCTIONS  Activity: Rest for the  remainder of the day.  Do not drive or operate equipment today.  You may resume normal activities in one to two days as instructed by your physician.   Meals: Drink plenty of liquids and eat light foods such as gelatin or soup this evening.  You may return to a normal meal plan tomorrow.  Return to Work: You may return to work in one to two days or as instructed by your physician.  Special Instructions / Symptoms: Call your physician if any of these symptoms occur:   -persistent or heavy bleeding  -bleeding which continues after first few urination  -large blood clots that are difficult to pass  -urine stream diminishes or stops completely  -fever equal to or higher than 101 degrees Farenheit.  -cloudy urine with a strong, foul odor  -severe pain  Females should always wipe from front to back after elimination.  You may feel some burning pain when you urinate.  This should disappear with time.  Applying moist heat to the lower abdomen or a hot tub bath may help relieve the pain. \  Follow-Up / Date of Return Visit to Your Physician:  *** Call for an appointment to arrange follow-up.  Patient Signature:  ________________________________________________________  Nurse's Signature:  ________________________________________________________ given to you by your health care provider. Make sure you discuss any questions you have with your health care provider.

## 2014-01-14 NOTE — H&P (Signed)
Reason For Visit 3 week f/u   Active Problems Problems  1. Chronic interstitial cystitis without hematuria (595.1)   Assessed By: Carolan Clines (Urology); Last Assessed: 29 Dec 2013 2. Female pelvic pain (625.9)  History of Present Illness 63 year old female returns today for a 3 week f/u after taking Myrbetriq 50mg . She noted severe abdominal ain with Myrbetriq, with gas, cramping, and diarrhea-exacerbation of her IBS; which abated when she stopped the Myrbetriq. However, she noted a decrease in her urinary urgency and bladder s-p pressure.     Hx of IC, complaining of urgency and s-p pressure over the last 6 months.     She is s/p cysto/HOD on 03/12/10. She had a period after the surgery that she felt great, but then started having pelvic floor spasms for which she used Gelnique for a little while which did help. She is not currently using Gelnique because she notice her hair thinning, ( strong family history of alopecia) and was but seems to be doing well w/o the medication until December. She was 30-45% better than before her surgery. She states that she is no longer able to have intercourse w/o having pain. Current PUFF= 16      Hx of a 15 year history of urgency, frequency, nocturia, pressure, and abdominal bloating. She had cystoscopy per Dr. Serita Butcher 15 years ago, and currently complains of frequency urgency and feeling of urinary tract infection. Her puff score is 21. Note her allergy to Levsinex and hyoscyamine. The patient complains of dyspareunia, urgency, and vaginal dryness. She believes she may have fibromyalgia, GERD with IBS, spastic colon, and depression. She is post hysterectomy. No migraines. + anxiety.         Urodynamics shows a maximum bladder capacity of 118 cc, with first sensation at 21 cc, normal desire at 57 cc and strong desire at 77 cc. The bladder is otherwise stable. She did not leak for leak point pressure evaluation, an abdominal pressure of 107 cm  water.        Pressure flow study is accomplished, shows a flow rate of 4 cc per second, with maximum detrusor pressure of 17 cm water. PVR is zero.        This patient appears to have severe sensory urgency. There is no stability or stress incontinence noted. She was able to void voluntarily with zero glucose 10.   Past Medical History Problems  1. History of Anxiety (300.00) 2. History of Arthritis (V13.4) 3. History of Asthma (493.90) 4. History of cardiac disorder (V12.50) 5. History of esophageal reflux (V12.79) 6. History of gout (V12.29) 7. History of hypercholesterolemia (V12.29) 8. History of hypertension (V12.59) 9. History of transient cerebral ischemia (V12.54)  Surgical History Problems  1. History of Back Surgery 2. History of Biopsy Breast Open 3. History of Bladder Irrigation 4. History of Cystoscopy With Biopsy 5. History of Cystoscopy With Dilation Of Bladder 6. History of Hysterectomy 7. History of Ovarian Surgery 8. History of Shoulder Surgery Left  Current Meds 1. Acidophilus 500 MG CAPS;  Therapy: (Recorded:01Jul2015) to Recorded 2. Acyclovir 400 MG Oral Tablet;  Therapy: (Recorded:01Jul2015) to Recorded 3. Aspirin 81 MG Oral Tablet;  Therapy: (Recorded:01Jul2015) to Recorded 4. Ativan 0.5 MG Oral Tablet;  Therapy: (Recorded:01Jul2015) to Recorded 5. Atorvastatin Calcium 20 MG Oral Tablet;  Therapy: 23Dec2014 to Recorded 6. Climara 0.025 MG/24HR Transdermal Patch Weekly;  Therapy: (Recorded:01Jul2015) to Recorded 7. Dicyclomine HCl - 10 MG Oral Capsule; TAKE CAPSULE  PRN;  Therapy: (Recorded:01Jul2015) to Recorded 8.  Diltiazem HCl SOLN;  Therapy: (Recorded:01Jul2015) to Recorded 9. Doxylamine Succinate 25 MG TABS;  Therapy: (Recorded:01Jul2015) to Recorded 10. Fluticasone Propionate 50 MCG/ACT Nasal Suspension;   Therapy: (Recorded:01Jul2015) to Recorded 11. Librax 5-2.5 MG Oral Capsule; TAKE CAPSULE  PRN;   Therapy: (Recorded:01Jul2015)  to Recorded 12. LORazepam 0.5 MG Oral Tablet; TAKE TABLET  PRN;   Therapy: (Recorded:01Jul2015) to Recorded 13. Multi Vitamin/Minerals TABS;   Therapy: (Recorded:01Jul2015) to Recorded 14. Myrbetriq 50 MG Oral Tablet Extended Release 24 Hour; Take 1 tablet daily;   Therapy: [id number] to (Evaluate:27Jan2016); Last Rx:01Jul2015 Ordered 15. NexIUM 40 MG Oral Capsule Delayed Release;   Therapy: (Recorded:01Jul2015) to Recorded 16. Nitroglycerin 0.4 MG SUBL;   Therapy: (Recorded:01Jul2015) to Recorded 17. Oxycodone-Acetaminophen 5-325 MG Oral Tablet;   Therapy: (Recorded:01Jul2015) to Recorded 18. Ranexa 500 MG Oral Tablet Extended Release 12 Hour;   Therapy: (Recorded:01Jul2015) to Recorded 19. Tart Cherry Advanced CAPS;   Therapy: (Recorded:01Jul2015) to Recorded 20. Temazepam 15 MG Oral Capsule;   Therapy: (Recorded:01Jul2015) to Recorded 21. TraMADol HCl - 50 MG Oral Tablet;   Therapy: (Recorded:01Jul2015) to Recorded 22. Turmeric CAPS;   Therapy: (Recorded:01Jul2015) to Recorded 23. Vayacog CAPS;   Therapy: (Recorded:01Jul2015) to Recorded 24. Vitamin D 1000 UNIT Oral Tablet;   Therapy: (Recorded:01Jul2015) to Recorded  Allergies Medication  1. Doxycycline Hyclate CAPS 2. Hydrocodone-Acetaminophen TABS 3. Hyoscyamine TABS 4. Levsinex CP12 5. Lexapro TABS 6. Nitrofurantoin Monohyd Macro CAPS 7. TraZODone HCl TABS 8. clindamycin  Family History Problems  1. Family history of Alzheimer's Disease 2. Family history of Death In The Family Father   passed at 66 from heart disease 3. Family history of Family Health Status - Mother's Age   43 4. Family history of Family Health Status Children ___ Living Daughters   1 5. Family history of Family Health Status Children ___ Living Sons   1 6. Family history of Heart Disease (V17.49) : Father 7. Family history of Heart Disease (V17.49) : Mother 8. Family history of Heart Disease (V17.49) : Brother  Social  History Problems  1. Activities Of Daily Living 2. Alcohol Use   1-2 per week 3. Denied: History of Caffeine Use 4. Exercise Habits   She has been trying to workup with [employer] resisted weights at least 3-4 times a week. 5. Former smoker [place])   smoked 1 ppd for 10 years; quit 37 years ago 4. Living Independently With Spouse 7. Marital History - Currently Married 8. 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 Self-reliant In Usual Daily Activities  Review of Systems Constitutional and skin system(s) were reviewed and pertinent findings if present are noted.  Genitourinary: urinary frequency, nocturia, urinary hesitancy, incomplete emptying of bladder, pelvic pain and dyspareunia, but no urinary urgency, no dysuria, no incontinence, urinary stream does not start and stop, no hematuria, no urethral discharge, no foul smelling urine, urine not cloudy, no vaginal discharge and initiating urination does not require straining.  Gastrointestinal: nausea, vomiting and diarrhea, but no constipation. pain during intercourse    Vitals Vital Signs [Data Includes: Last 1 Day]  Recorded: 22Jul2015 08:39AM  Blood Pressure: 116 / 75 Temperature: 97 F Heart Rate: 68  Physical Exam Constitutional: Well nourished and well developed . No acute distress.  ENT:. The ears and nose are normal in appearance.  Neck: The appearance of the neck is normal and no neck mass is present.  Pulmonary: No respiratory distress and normal respiratory rhythm and effort.  Cardiovascular: Heart rate and  rhythm are normal . No peripheral edema.  Abdomen: The abdomen is soft and nontender. No masses are palpated. No CVA tenderness. No hernias are palpable. No hepatosplenomegaly noted.  Genitourinary:. Left levator spasm and pain > right.  Chaperone Present: lauren.  Examination of the external genitalia shows normal female external genitalia. There is a urethral mass. Vaginal exam  demonstrates no abnormalities. No cystocele is identified. No enterocele is identified. No rectocele is identified. The cervix is is absent. The uterus is absent. The bladder is normal on palpation, non tender and not distended.  Lymphatics: The femoral and inguinal nodes are not enlarged or tender.  Skin: Normal skin turgor, no visible rash and no visible skin lesions.  Neuro/Psych:. Mood and affect are appropriate.    Results/Data  29 Dec 2013 8:31 AM  UA With REFLEX    COLOR YELLOW     APPEARANCE CLEAR     SPECIFIC GRAVITY 1.025     pH 5.5     GLUCOSE NEG     BILIRUBIN NEG     KETONE NEG     BLOOD LARGE     PROTEIN NEG     UROBILINOGEN 0.2     NITRITE NEG     LEUKOCYTE ESTERASE NEG     SQUAMOUS EPITHELIAL/HPF RARE     WBC 0-2     CRYSTALS NONE SEEN     CASTS NONE SEEN     RBC 11-20     BACTERIA FEW  Assessment Assessed  1. Chronic interstitial cystitis without hematuria (595.1) 2. Urinary urgency (788.63) 3. Female pelvic pain (625.9) 4. Microscopic hematuria (599.72)  Pt is intolerant of meds for OAB symptoms, associated with IC. She has significant IBS also. She did get some relief from cysto, HOD in 2011. (? length of relief). 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 Seh is also taking care of grandaughters ( ages 49 and 17, while mother is working). Husband is trying to help-may be retiring.    She exercises 4x/week with walking or gym exercises.    Microhematuria will need CT scan.   Plan Chronic interstitial cystitis without hematuria, Urge incontinence of urine, Urinary urgency  1. Start: Gelnique 10 % Transdermal Gel; use on arms or abdomen or thighs each day Microscopic hematuria  2. AU CT-HEMATURIA PROTOCOL; Status:Hold For - Appointment,PreCert,Date of  Service,Print; Requested for:22Jul2015;   OK for cysto, HOD. Possible injection of levators.   Gelnique again    [employer] signed by : Carolan Clines, M.D.; Dec 29 2013  9:25AM EST

## 2014-01-14 NOTE — Transfer of Care (Signed)
Immediate Anesthesia Transfer of Care Note  Patient: [name]  Procedure(s) Performed: Procedure(s) (LRB): CYSTOSCOPY/HYDRODISTENSION/INSERTION OF MARCAINE AND PYRIDUIM INTO BLADDER/INJECTION OF MARCAINE AND KENALOG into sub trigone space  (N/A)  Patient Location: PACU  Anesthesia Type: General  Level of Consciousness: awake, alert  and oriented  Airway & Oxygen Therapy: Patient Spontanous Breathing and Patient connected to nasal cannula oxygen  Post-op Assessment: Report given to PACU RN and Post -op Vital signs reviewed and stable  Post vital signs: Reviewed and stable  Complications: No apparent anesthesia complications

## 2014-01-14 NOTE — Anesthesia Postprocedure Evaluation (Signed)
  Anesthesia Post-op Note  Patient: [name]  Procedure(s) Performed: Procedure(s) (LRB): CYSTOSCOPY/HYDRODISTENSION/INSERTION OF MARCAINE AND PYRIDUIM INTO BLADDER/INJECTION OF MARCAINE AND KENALOG into sub trigone space  (N/A)  Patient Location: PACU  Anesthesia Type: General  Level of Consciousness: awake and alert   Airway and Oxygen Therapy: Patient Spontanous Breathing  Post-op Pain: mild  Post-op Assessment: Post-op Vital signs reviewed, Patient's Cardiovascular Status Stable, Respiratory Function Stable, Patent Airway and No signs of Nausea or vomiting  Last Vitals:  Filed Vitals:   01/14/14 0927  BP:   Pulse: 71  Temp:   Resp: 13    Post-op Vital Signs: stable   Complications: No apparent anesthesia complications

## 2014-01-17 ENCOUNTER — Encounter (HOSPITAL_BASED_OUTPATIENT_CLINIC_OR_DEPARTMENT_OTHER): Payer: Self-pay | Admitting: Urology

## 2014-01-18 ENCOUNTER — Telehealth: Payer: Self-pay | Admitting: *Deleted

## 2014-01-18 NOTE — Telephone Encounter (Signed)
OK to refill Ativan .5 mg, no refills

## 2014-01-18 NOTE — Telephone Encounter (Signed)
We have gotten a request from patient's pharmacy for ativan 0.5 mg tablets (1 tablet SL tid prn chest pain). This was last filled 05/13/12. Shall I continue to fill or does she need office visit first?

## 2014-01-18 NOTE — Telephone Encounter (Signed)
OK to refill Ativan .5 mg, no refills. Will need OV for next refill.

## 2014-01-19 MED ORDER — LORAZEPAM 0.5 MG PO TABS: ORAL_TABLET | ORAL | Status: DC

## 2014-01-19 NOTE — Telephone Encounter (Signed)
Rx sent 

## 2014-01-26 ENCOUNTER — Other Ambulatory Visit: Payer: Self-pay

## 2014-01-26 DIAGNOSIS — Z1231 Encounter for screening mammogram for malignant neoplasm of breast: Secondary | ICD-10-CM

## 2014-03-17 ENCOUNTER — Ambulatory Visit
Admission: RE | Admit: 2014-03-17 | Discharge: 2014-03-17 | Disposition: A | Discharge status: Home / Self Care | Payer: BC Managed Care – PPO | Source: Ambulatory Visit

## 2014-03-17 DIAGNOSIS — Z1231 Encounter for screening mammogram for malignant neoplasm of breast: Secondary | ICD-10-CM

## 2014-04-14 ENCOUNTER — Other Ambulatory Visit: Payer: Self-pay

## 2014-04-14 MED ORDER — RANOLAZINE ER 500 MG PO TB12
500.0000 mg | ORAL_TABLET | Freq: Two times a day (BID) | ORAL | Status: DC
Start: 2014-04-14 — End: 2015-02-06

## 2014-05-12 ENCOUNTER — Telehealth: Payer: Self-pay

## 2014-05-12 NOTE — Telephone Encounter (Signed)
Patient called for samples of ranexa placed samples up front

## 2014-05-16 ENCOUNTER — Ambulatory Visit (INDEPENDENT_AMBULATORY_CARE_PROVIDER_SITE_OTHER): Payer: BC Managed Care – PPO | Admitting: Gastroenterology

## 2014-05-16 ENCOUNTER — Telehealth: Payer: Self-pay | Admitting: *Deleted

## 2014-05-16 ENCOUNTER — Encounter: Payer: Self-pay | Admitting: Gastroenterology

## 2014-05-16 VITALS — BP 110/70 | HR 66 | Ht 62.0 in | Wt 128.6 lb

## 2014-05-16 DIAGNOSIS — R1084 Generalized abdominal pain: Secondary | ICD-10-CM

## 2014-05-16 DIAGNOSIS — R197 Diarrhea, unspecified: Secondary | ICD-10-CM

## 2014-05-16 DIAGNOSIS — K589 Irritable bowel syndrome without diarrhea: Secondary | ICD-10-CM

## 2014-05-16 MED ORDER — DICYCLOMINE HCL 10 MG PO CAPS: 10.0000 mg | ORAL_CAPSULE | Freq: Three times a day (TID) | ORAL | Status: DC | PRN

## 2014-05-16 MED ORDER — LORAZEPAM 0.5 MG PO TABS: ORAL_TABLET | ORAL | Status: DC

## 2014-05-16 MED ORDER — CILIDINIUM-CHLORDIAZEPOXIDE 2.5-5 MG PO CAPS: 1.0000 | ORAL_CAPSULE | Freq: Three times a day (TID) | ORAL | Status: DC

## 2014-05-16 MED ORDER — PANCRELIPASE (LIP-PROT-AMYL) 36000-114000 UNITS PO CPEP: ORAL_CAPSULE | ORAL | Status: DC

## 2014-05-16 MED ORDER — ALPRAZOLAM 0.25 MG PO TABS: 0.2500 mg | ORAL_TABLET | ORAL | Status: DC | PRN

## 2014-05-16 NOTE — Progress Notes (Signed)
Reviewed and agree. I will see her in follow up. Thanx

## 2014-05-16 NOTE — Patient Instructions (Addendum)
We have sent the following medications to [facility], your mail order pharmacy: Librax  Bentyl   We have sent the following medications to [facility] for you to pick up at your convenience: Xanax Creon   You are scheduled a follow up with Dr. Olevia Perches for 06-24-2014 at 3:30 pm If you need to cancel or reschedule please call [phone]

## 2014-05-16 NOTE — Progress Notes (Signed)
     05/16/2014 [name] [id number] November 11, 1950   History of Present Illness:  This is a 63 year old female who is known to Dr. Olevia Perches and is followed her for her IBS-D and GERD with esophageal spasm.  She also has anxiety and interstitial cystitis.  She presents to our office today with complaints of lower abdominal cramping and episodes of diarrhea.  She says that she is currently taking bentyl, librax, and ativan prn for her GI issues.  Takes Bentyl for abdominal cramping and librax and ativan for esophageal spasm.  Celiac testing was negative in the past.  Colonoscopy 03/2013 showed only small internal hemorrhoids with a recommended 10 year recall.   Current Medications, Allergies, Past Medical History, Past Surgical History, Family History and Social History were reviewed in [employer] record.   Physical Exam: BP 110/70 mmHg  Pulse 66  Ht 5\' 2"  (1.575 m)  Wt 128 lb 9.6 oz (58.333 kg)  BMI 23.52 kg/m2 General: Well developed white female in no acute distress Head: Normocephalic and atraumatic Eyes:  Sclerae anicteric, conjunctiva pink  Ears: Normal auditory acuity Lungs: Clear throughout to auscultation Heart: Regular rate and rhythm Abdomen: Soft, non-distended.  Normal bowel sounds.  Minimal diffuse TTP without R/R/G. Musculoskeletal: Symmetrical with no gross deformities  Extremities: No edema  Neurological: Alert oriented x 4, grossly non-focal Psychological:  Alert and cooperative. Normal mood and affect  Assessment and Recommendations: -63 year old female with history of IBS and esophageal spasm presenting with complaints of diarrhea and abdominal pain:  Is using Bentyl, Librax, and Ativan all as needed for her abdominal pains and esophageal spasm.  Will try Creon 36000 units 2 capsules with meals and 1 with snacks to see if this helps her symptoms.  If no improvement after 4 weeks then she will try taking Bentyl regularly 10 mg BID.  Will  continue to take librax and ativan prn.  Follow-up in 6 weeks with Dr. Olevia Perches.  Another option may be to try Viberzi. -Interstitial cystitis -Anxiety

## 2014-05-16 NOTE — Telephone Encounter (Signed)
Patient made a mistake needed Ativan refilled not Xanax Per Janett Billow okay to refill Xanax cancelled and Ativan sent

## 2014-05-26 ENCOUNTER — Telehealth: Payer: Self-pay | Admitting: Gastroenterology

## 2014-05-26 ENCOUNTER — Other Ambulatory Visit: Payer: Self-pay | Admitting: *Deleted

## 2014-05-26 DIAGNOSIS — R1013 Epigastric pain: Secondary | ICD-10-CM

## 2014-05-26 NOTE — Telephone Encounter (Signed)
Scheduled ultrasound of abdomen at [facility] radiology([place]) on 05/27/14 at 9:00 AM. NPO after midnight. Patient notified of appointment date, time and instructions.

## 2014-05-26 NOTE — Telephone Encounter (Signed)
Start with abdominal ultrasound.  Thank you,  Jess

## 2014-05-26 NOTE — Telephone Encounter (Signed)
States when she was walking yesterday, she had chest discomfort. She thought it was her heart and she took Nitro. She later realized it was not heart pain but pain under her ribs that goes to her back. States she has had this before and thought it was gallbladder. Feels uncomfortable like a "basketball in there." She also reports lots of burping. Tums does not help. She is asking if she should have test to see if this is gallbladder. Please, advise.

## 2014-05-27 ENCOUNTER — Ambulatory Visit (HOSPITAL_COMMUNITY)
Admission: RE | Admit: 2014-05-27 | Discharge: 2014-05-27 | Disposition: A | Discharge status: Home / Self Care | Payer: BC Managed Care – PPO | Source: Ambulatory Visit | Attending: Gastroenterology | Admitting: Gastroenterology

## 2014-05-27 DIAGNOSIS — R1013 Epigastric pain: Secondary | ICD-10-CM | POA: Diagnosis present

## 2014-05-27 DIAGNOSIS — I700 Atherosclerosis of aorta: Secondary | ICD-10-CM | POA: Insufficient documentation

## 2014-06-24 ENCOUNTER — Ambulatory Visit: Payer: BC Managed Care – PPO | Admitting: Internal Medicine

## 2014-06-28 ENCOUNTER — Telehealth: Payer: Self-pay | Admitting: *Deleted

## 2014-06-28 NOTE — Telephone Encounter (Signed)
Ranexa samples placed at the front desk for patient. 

## 2014-07-05 ENCOUNTER — Other Ambulatory Visit: Payer: Self-pay | Admitting: Internal Medicine

## 2014-07-05 ENCOUNTER — Ambulatory Visit
Admission: RE | Admit: 2014-07-05 | Discharge: 2014-07-05 | Disposition: A | Discharge status: Home / Self Care | Payer: BLUE CROSS/BLUE SHIELD | Source: Ambulatory Visit | Attending: Internal Medicine | Admitting: Internal Medicine

## 2014-07-05 DIAGNOSIS — R05 Cough: Secondary | ICD-10-CM

## 2014-07-05 DIAGNOSIS — R059 Cough, unspecified: Secondary | ICD-10-CM

## 2014-07-29 ENCOUNTER — Ambulatory Visit: Payer: Self-pay | Admitting: Internal Medicine

## 2014-08-22 ENCOUNTER — Other Ambulatory Visit: Payer: Self-pay | Admitting: Interventional Cardiology

## 2014-09-13 ENCOUNTER — Telehealth: Payer: Self-pay | Admitting: Cardiovascular Disease

## 2014-09-13 ENCOUNTER — Telehealth: Payer: Self-pay | Admitting: Interventional Cardiology

## 2014-09-13 NOTE — Telephone Encounter (Signed)
Pt requesting samples of Ranexa. Please call home number [phone] if able to provide.

## 2014-09-13 NOTE — Telephone Encounter (Signed)
Put samples of ranexa 500 front

## 2014-09-13 NOTE — Telephone Encounter (Signed)
Follow UP  ° °Pt returned call  °

## 2014-09-22 NOTE — Telephone Encounter (Signed)
ERROR

## 2014-12-02 ENCOUNTER — Telehealth: Payer: Self-pay

## 2014-12-02 NOTE — Telephone Encounter (Signed)
Prior auth  Sent to [employer] for Ranexa 500mg  ER via Cover my Meds.

## 2014-12-02 NOTE — Telephone Encounter (Signed)
PA done

## 2014-12-14 ENCOUNTER — Telehealth: Payer: Self-pay | Admitting: Internal Medicine

## 2014-12-14 NOTE — Telephone Encounter (Signed)
I have talked to the patient, she will follow up with Dr Gaynelle Arabian

## 2014-12-14 NOTE — Telephone Encounter (Signed)
Pt requests to speak with Dr. Olevia Perches only.

## 2014-12-15 ENCOUNTER — Other Ambulatory Visit: Payer: Self-pay | Admitting: Urology

## 2014-12-29 ENCOUNTER — Encounter (HOSPITAL_BASED_OUTPATIENT_CLINIC_OR_DEPARTMENT_OTHER): Payer: Self-pay | Admitting: *Deleted

## 2014-12-29 NOTE — Progress Notes (Signed)
NPO AFTER MN.  ARRIVE AT 0900.  NEEDS ISTAT AND EKG.  WILL TAKE RANEXA  AND NEXIUM W/ SIPS OF WATER.

## 2015-01-01 NOTE — H&P (Signed)
Reason For Visit Cystoscopy   Active Problems Problems  1. Chronic interstitial cystitis without hematuria (N30.10)   Assessed By: Jiles Crocker (Urology); Last Assessed: 04 Nov 2014  History of Present Illness     64 yo female with a hx of IC, returns today for a cystoscopy for hx of gross hematuria. She has minimal tobacco exposure of 10 pk/urs, and none x 40 yrs.     She was last seen by Jiles Crocker, NP on 11/04/14 with a one day history of dysuria and gross hematuria after drinking tea (bladder irritant). She had a negative full gross hematuria work up [place] in July 2015 and a negative cystoscopy in 8/15 for HOD; and again on 05/31/14. 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       S/P cysto/HOD (900 ml). Post dilatation multiple areas of ulceration.    Previously she noted severe abdominal pain with Myrbetriq, with gas, cramping, and diarrhea-exacerbation of her IBS; which abated when she stopped the Myrbetriq. However, she noted a decrease in her urinary urgency and bladder s-p pressure.    She has failed Gelnique because of urinary retention.       Hx of IC, complaining of urgency and s-p pressure over the last 6 months. She is s/p cysto/HOD on 03/12/10. She had a period after the surgery that she felt great, but then started having pelvic floor spasms for which she used Gelnique for a little while which did help. She is not currently using Gelnique because she notice her hair thinning, ( strong family history of alopecia) and was but seems to be doing well w/o the medication until December. She was 30-45% better than before her surgery. She states that she is no longer able to have intercourse w/o having pain. Current PUFF= 16      Hx of a 15 year history of urgency, frequency, nocturia, pressure, and abdominal bloating. She had cystoscopy per Dr. Serita Butcher 15 years ago, and currently complains of frequency urgency and feeling of urinary tract  infection. Her puff score is 21. Note her allergy to Levsinex and hyoscyamine. The patient complains of dyspareunia, urgency, and vaginal dryness. She believes she may have fibromyalgia, GERD with IBS, spastic colon, and depression. She is post hysterectomy. No migraines. + anxiety.         Urodynamics shows a maximum bladder capacity of 118 cc, with first sensation at 21 cc, normal desire at 57 cc and strong desire at 77 cc. The bladder is otherwise stable. She did not leak for leak point pressure evaluation, an abdominal pressure of 107 cm water.        Pressure flow study is accomplished, shows a flow rate of 4 cc per second, with maximum detrusor pressure of 17 cm water. PVR is zero.        This patient appears to have severe sensory urgency. There is no stability or stress incontinence noted. She was able to void voluntarily with zero glucose 10.     Has now started PT with [facility]. [facility]. Initial session 05/12/14.   Past Medical History Problems  1. History of Anxiety (F41.9) 2. History of Arthritis 3. History of Asthma (J45.909) 4. History of cardiac disorder (Z86.79) 5. History of esophageal reflux (Z87.19) 6. History of gout (Z87.39) 7. History of hypercholesterolemia (Z86.39) 8. History of hypertension (Z86.79) 9. History of transient cerebral ischemia (C37.62)  Surgical History Problems  1. History of Back Surgery 2. History  of Biopsy Breast Open 3. History of Bladder Irrigation 4. History of Bladder Irrigation 5. History of Cystoscopy With Biopsy 6. History of Cystoscopy With Dilation Of Bladder 7. History of Cystoscopy With Dilation Of Bladder 8. History of Hysterectomy 9. History of Ovarian Surgery 10. History of Shoulder Surgery Left  Current Meds 1. Acidophilus 500 MG CAPS;  Therapy: (Recorded:01Jul2015) to Recorded 2. Acyclovir 400 MG Oral Tablet;  Therapy: (Recorded:01Jul2015) to Recorded 3. Aspirin 81 MG TABS;  Therapy: (Recorded:01Jul2015) to Recorded 4.  Ativan 0.5 MG Oral Tablet;  Therapy: (Recorded:01Jul2015) to Recorded 5. Atorvastatin Calcium 20 MG Oral Tablet;  Therapy: 23Dec2014 to Recorded 6. Climara 0.025 MG/24HR Transdermal Patch Weekly;  Therapy: (Recorded:01Jul2015) to Recorded 7. Dicyclomine HCl - 10 MG Oral Capsule; TAKE CAPSULE  PRN;  Therapy: (Recorded:01Jul2015) to Recorded 8. Diltiazem HCl SOLN;  Therapy: (Recorded:01Jul2015) to Recorded 9. Doxylamine Succinate 25 MG TABS;  Therapy: (Recorded:01Jul2015) to Recorded 10. Fluticasone Propionate 50 MCG/ACT Nasal Suspension;   Therapy: (Recorded:01Jul2015) to Recorded 11. Gelnique 10 % Transdermal Gel; use on arms or abdomen or thighs each day;   Therapy: [phone] to (Evaluate:17Feb2016)  Requested for: 22Jul2015; Last   Rx:22Jul2015 Ordered 12. Librax 5-2.5 MG Oral Capsule; TAKE CAPSULE  PRN;   Therapy: (Recorded:01Jul2015) to Recorded 13. LORazepam 0.5 MG Oral Tablet; TAKE TABLET  PRN;   Therapy: (Recorded:01Jul2015) to Recorded 14. Multi Vitamin/Minerals TABS;   Therapy: (Recorded:01Jul2015) to Recorded 15. Myrbetriq 50 MG Oral Tablet Extended Release 24 Hour; Take 1 tablet daily;   Therapy: [id number] to (Evaluate:27Jan2016); Last Rx:01Jul2015 Ordered 16. NexIUM 40 MG Oral Capsule Delayed Release;   Therapy: (Recorded:01Jul2015) to Recorded 17. Nitroglycerin 0.4 MG SUBL;   Therapy: (Recorded:01Jul2015) to Recorded 18. Oxycodone-Acetaminophen 5-325 MG Oral Tablet;   Therapy: (Recorded:01Jul2015) to Recorded 19. Ranexa 500 MG Oral Tablet Extended Release 12 Hour;   Therapy: (Recorded:01Jul2015) to Recorded 20. Tart Cherry Advanced CAPS;   Therapy: (Recorded:01Jul2015) to Recorded 21. Temazepam 15 MG Oral Capsule;   Therapy: (Recorded:01Jul2015) to Recorded 22. TraMADol HCl - 50 MG Oral Tablet; TAKE 1 TABLET Every  6 hours; Last Rx:07Aug2015   Ordered 23. Turmeric CAPS;   Therapy: (Recorded:01Jul2015) to Recorded 24. Vayacog CAPS;   Therapy: (Recorded:01Jul2015)  to Recorded 25. Vitamin D 1000 UNIT Oral Tablet;   Therapy: (Recorded:01Jul2015) to Recorded  Allergies Medication  1. Doxycycline Hyclate CAPS 2. Hydrocodone-Acetaminophen TABS 3. Hyoscyamine TABS 4. Levsinex CP12 5. Lexapro TABS 6. Nitrofurantoin Monohyd Macro CAPS 7. TraZODone HCl TABS 8. clindamycin  Family History Problems  1. Family history of Alzheimer's Disease 2. Family history of Death In The Family Father   passed at 40 from heart disease 3. Family history of Family Health Status - Mother's Age   74 4. Family history of Family Health Status Children ___ Living Daughters   1 5. Family history of Family Health Status Children ___ Living Sons   1 6. Family history of Heart Disease : Father 79. Family history of Heart Disease : Mother 8. Family history of Heart Disease : Brother  Social History Problems  1. Activities Of Daily Living 2. Alcohol Use   1-2 per week 3. Denied: History of Caffeine Use 4. Exercises regularly   She is working out at the gym 3-4 days a week. 5. Former smoker [phone])   smoked 1 ppd for 10 years; quit 37 years ago 63. Living Independently With Spouse 7. Marital History - Currently Married 8. 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 Self-reliant  In Usual Daily Activities  Review of Systems Genitourinary, constitutional, skin, eye, otolaryngeal, hematologic/lymphatic, cardiovascular, pulmonary, endocrine, musculoskeletal, gastrointestinal, neurological and psychiatric system(s) were reviewed and pertinent findings if present are noted and are otherwise negative.  Genitourinary: hematuria.    Vitals Vital Signs [Data Includes: Last 1 Day]  Recorded: [id number] 09:14AM  Blood Pressure: 133 / 73 Temperature: 97.4 F Heart Rate: 62  Results/Data Urine [Data Includes: Last 1 Day]   [id number]  COLOR YELLOW   APPEARANCE CLEAR   SPECIFIC GRAVITY 1.025   pH 6.5   GLUCOSE NEG mg/dL  BILIRUBIN  NEG   KETONE NEG mg/dL  BLOOD SMALL   PROTEIN NEG mg/dL  UROBILINOGEN 0.2 mg/dL  NITRITE NEG   LEUKOCYTE ESTERASE TRACE   SQUAMOUS EPITHELIAL/HPF MODERATE   WBC 3-6 WBC/hpf  RBC 7-10 RBC/hpf  BACTERIA FEW   CRYSTALS NONE SEEN   CASTS NONE SEEN    Procedure  Procedure: Cystoscopy  Chaperone Present: laura.  Indication: Hematuria.  Informed Consent: Risks, benefits, and potential adverse events were discussed and informed consent was obtained from the patient.  Prep: The patient was prepped with betadine.  Anesthesia:. Local anesthesia was administered intraurethrally with 2% lidocaine jelly.  Antibiotic prophylaxis: Ciprofloxacin.  Procedure Note:  Urethral meatus:. No abnormalities.  Anterior urethra: No abnormalities.  Bladder: Visulization was clear. Examination of the bladder demonstrated no clot within the bladder and no trabeculation no erythematous mucosa, no edema and no cellules. Multiple tumors were identified in the bladder. A papillary tumor was seen in the bladder measuring approximately 2 cm in size. This tumor was located on the right side, on the anterior aspect, near the trigone of the bladder. Another papillary tumor was seen in the bladder measuring approximately 2 cm in size. This tumor was located on the right side, on the anterior aspect, at the neck of the bladder. An additional papillary tumor was seen in the bladder. This tumor was located on the right side, on the anterior aspect, at the base of the bladder.    Assessment Assessed  1. Gross hematuria (R31.0) 2. Female pelvic pain (R10.2)  Gross painless hematuria 2ndary to right sided tumors ( ? TCC). She has only a small, remote tobacco hx.   Plan Health Maintenance  1. UA With REFLEX; [Do Not Release]; Status:Resulted - Requires Verification;   Done:  [id number] 08:59AM  TUR-BT of right sided bladder masses. Concern if ro TCC.   Signatures Electronically signed by : Carolan Clines, M.D.; Dec 13 2014 10:13AM EST

## 2015-01-02 ENCOUNTER — Other Ambulatory Visit: Payer: Self-pay | Admitting: Urology

## 2015-01-02 ENCOUNTER — Ambulatory Visit (HOSPITAL_BASED_OUTPATIENT_CLINIC_OR_DEPARTMENT_OTHER)
Admission: RE | Admit: 2015-01-02 | Discharge: 2015-01-02 | Disposition: A | Discharge status: Home / Self Care | Payer: BLUE CROSS/BLUE SHIELD | Source: Ambulatory Visit | Attending: Urology | Admitting: Urology

## 2015-01-02 ENCOUNTER — Encounter (HOSPITAL_BASED_OUTPATIENT_CLINIC_OR_DEPARTMENT_OTHER): Payer: Self-pay | Admitting: *Deleted

## 2015-01-02 ENCOUNTER — Encounter (HOSPITAL_BASED_OUTPATIENT_CLINIC_OR_DEPARTMENT_OTHER)
Admission: RE | Disposition: A | Discharge status: Home / Self Care | Payer: Self-pay | Source: Ambulatory Visit | Attending: Urology

## 2015-01-02 DIAGNOSIS — E780 Pure hypercholesterolemia: Secondary | ICD-10-CM | POA: Diagnosis not present

## 2015-01-02 DIAGNOSIS — R1310 Dysphagia, unspecified: Secondary | ICD-10-CM | POA: Diagnosis not present

## 2015-01-02 DIAGNOSIS — I739 Peripheral vascular disease, unspecified: Secondary | ICD-10-CM | POA: Diagnosis not present

## 2015-01-02 DIAGNOSIS — J45909 Unspecified asthma, uncomplicated: Secondary | ICD-10-CM | POA: Insufficient documentation

## 2015-01-02 DIAGNOSIS — Z79890 Hormone replacement therapy (postmenopausal): Secondary | ICD-10-CM | POA: Insufficient documentation

## 2015-01-02 DIAGNOSIS — M109 Gout, unspecified: Secondary | ICD-10-CM | POA: Insufficient documentation

## 2015-01-02 DIAGNOSIS — Z79899 Other long term (current) drug therapy: Secondary | ICD-10-CM | POA: Diagnosis not present

## 2015-01-02 DIAGNOSIS — C675 Malignant neoplasm of bladder neck: Secondary | ICD-10-CM | POA: Insufficient documentation

## 2015-01-02 DIAGNOSIS — Z7982 Long term (current) use of aspirin: Secondary | ICD-10-CM | POA: Insufficient documentation

## 2015-01-02 DIAGNOSIS — Z7951 Long term (current) use of inhaled steroids: Secondary | ICD-10-CM | POA: Diagnosis not present

## 2015-01-02 DIAGNOSIS — Z8673 Personal history of transient ischemic attack (TIA), and cerebral infarction without residual deficits: Secondary | ICD-10-CM | POA: Diagnosis not present

## 2015-01-02 DIAGNOSIS — F419 Anxiety disorder, unspecified: Secondary | ICD-10-CM | POA: Diagnosis not present

## 2015-01-02 DIAGNOSIS — I2510 Atherosclerotic heart disease of native coronary artery without angina pectoris: Secondary | ICD-10-CM | POA: Insufficient documentation

## 2015-01-02 DIAGNOSIS — M1990 Unspecified osteoarthritis, unspecified site: Secondary | ICD-10-CM | POA: Diagnosis not present

## 2015-01-02 DIAGNOSIS — K224 Dyskinesia of esophagus: Secondary | ICD-10-CM | POA: Diagnosis not present

## 2015-01-02 DIAGNOSIS — R310 Gross hematuria: Secondary | ICD-10-CM | POA: Diagnosis present

## 2015-01-02 DIAGNOSIS — Z79891 Long term (current) use of opiate analgesic: Secondary | ICD-10-CM | POA: Insufficient documentation

## 2015-01-02 DIAGNOSIS — Z87891 Personal history of nicotine dependence: Secondary | ICD-10-CM | POA: Insufficient documentation

## 2015-01-02 DIAGNOSIS — K219 Gastro-esophageal reflux disease without esophagitis: Secondary | ICD-10-CM | POA: Diagnosis not present

## 2015-01-02 DIAGNOSIS — I10 Essential (primary) hypertension: Secondary | ICD-10-CM | POA: Diagnosis not present

## 2015-01-02 HISTORY — DX: Neoplasm of unspecified behavior of bladder: D49.4

## 2015-01-02 LAB — POCT I-STAT 4, (NA,K, GLUC, HGB,HCT)
Glucose, Bld: 94 mg/dL (ref 65–99)
HCT: 39 % (ref 36.0–46.0)
Hemoglobin: 13.3 g/dL (ref 12.0–15.0)
Potassium: 4 mmol/L (ref 3.5–5.1)
Sodium: 142 mmol/L (ref 135–145)

## 2015-01-02 SURGERY — TURBT (TRANSURETHRAL RESECTION OF BLADDER TUMOR)
Anesthesia: General

## 2015-01-02 MED ORDER — CEFAZOLIN SODIUM-DEXTROSE 2-3 GM-% IV SOLR
2.0000 g | INTRAVENOUS | Status: DC
  Filled 2015-01-02: qty 50

## 2015-01-02 MED ORDER — FENTANYL CITRATE (PF) 100 MCG/2ML IJ SOLN
INTRAMUSCULAR | Status: AC
  Filled 2015-01-02: qty 4

## 2015-01-02 MED ORDER — MIDAZOLAM HCL 2 MG/2ML IJ SOLN
INTRAMUSCULAR | Status: AC
  Filled 2015-01-02: qty 2

## 2015-01-02 MED ORDER — CEFAZOLIN SODIUM-DEXTROSE 2-3 GM-% IV SOLR
INTRAVENOUS | Status: AC
Start: 2015-01-02 — End: 2015-01-02
  Filled 2015-01-02: qty 50

## 2015-01-02 MED ORDER — LACTATED RINGERS IV SOLN
INTRAVENOUS | Status: DC
  Administered 2015-01-02: 10:00:00 via INTRAVENOUS
  Filled 2015-01-02: qty 1000

## 2015-01-02 SURGICAL SUPPLY — 24 items
BAG DRAIN URO-CYSTO SKYTR STRL (DRAIN) ×1 IMPLANT
BAG DRN ANRFLXCHMBR STRAP LEK (BAG)
BAG DRN UROCATH (DRAIN)
BAG URINE DRAINAGE (UROLOGICAL SUPPLIES) IMPLANT
BAG URINE LEG 19OZ MD ST LTX (BAG) IMPLANT
BOOTIES KNEE HIGH SLOAN (MISCELLANEOUS) ×1 IMPLANT
CANISTER SUCT LVC 12 LTR MEDI- (MISCELLANEOUS) IMPLANT
CATH FOLEY 2WAY SLVR  5CC 20FR (CATHETERS)
CATH FOLEY 2WAY SLVR  5CC 22FR (CATHETERS)
CATH FOLEY 2WAY SLVR 5CC 20FR (CATHETERS) IMPLANT
CATH FOLEY 2WAY SLVR 5CC 22FR (CATHETERS) IMPLANT
CLOTH BEACON ORANGE TIMEOUT ST (SAFETY) ×1 IMPLANT
ELECT REM PT RETURN 9FT ADLT (ELECTROSURGICAL)
ELECTRODE REM PT RTRN 9FT ADLT (ELECTROSURGICAL) ×1 IMPLANT
GLOVE BIO SURGEON STRL SZ7.5 (GLOVE) ×1 IMPLANT
GOWN STRL REUS W/ TWL XL LVL3 (GOWN DISPOSABLE) ×1 IMPLANT
GOWN STRL REUS W/TWL XL LVL3 (GOWN DISPOSABLE)
GOWN XL W/COTTON TOWEL STD (GOWNS) ×1 IMPLANT
HOLDER FOLEY CATH W/STRAP (MISCELLANEOUS) IMPLANT
MANIFOLD NEPTUNE II (INSTRUMENTS) IMPLANT
PACK CYSTO (CUSTOM PROCEDURE TRAY) ×1 IMPLANT
PLUG CATH AND CAP STER (CATHETERS) IMPLANT
SET ASPIRATION TUBING (TUBING) IMPLANT
SYRINGE IRR TOOMEY STRL 70CC (SYRINGE) IMPLANT

## 2015-01-02 NOTE — Progress Notes (Addendum)
INSTRUCTIONS GIVEN BY KEELA BEASLEY RN.  NPO AFTER MN.  ARRIVE AT [id number].  CURRENT LAB RESULTS IN CHART AND EPIC. EKG  IN CHART.  WILL TAKE RANEXA AND NEXIUM AM DOS W/ SIPS OF WATER.

## 2015-01-02 NOTE — Progress Notes (Signed)
Procedure cancelled due to surgeon delay greater than 2 hours.Plan to reschedule.

## 2015-01-02 NOTE — Anesthesia Preprocedure Evaluation (Addendum)
Anesthesia Evaluation  Patient identified by MRN, date of birth, ID band Patient awake    Reviewed: Allergy & Precautions, H&P , NPO status , Patient's Chart, lab work & pertinent test results  History of Anesthesia Complications (+) PONV  Airway Mallampati: II  TM Distance: >3 FB Neck ROM: full    Dental no notable dental hx. (+) Teeth Intact, Dental Advisory Given   Pulmonary neg pulmonary ROS, former smoker,  breath sounds clear to auscultation  Pulmonary exam normal       Cardiovascular Exercise Tolerance: Good hypertension, Pt. on medications + CAD and + Peripheral Vascular Disease Normal cardiovascular examRhythm:regular Rate:Normal  moderat CAD on cath. Has CP   Neuro/Psych Anxiety Memory loss TIAnegative psych ROS   GI/Hepatic negative GI ROS, Neg liver ROS, GERD-  Medicated and Controlled,Dysphagia and esophageal spasm   Endo/Other  negative endocrine ROS  Renal/GU negative Renal ROS  negative genitourinary   Musculoskeletal   Abdominal   Peds  Hematology negative hematology ROS (+)   Anesthesia Other Findings TMJ syndrome  Reproductive/Obstetrics negative OB ROS                      Anesthesia Physical  Anesthesia Plan  ASA: III  Anesthesia Plan: General   Post-op Pain Management:    Induction:   Airway Management Planned: LMA  Additional Equipment:   Intra-op Plan:   Post-operative Plan:   Informed Consent: I have reviewed the patients History and Physical, chart, labs and discussed the procedure including the risks, benefits and alternatives for the proposed anesthesia with the patient or authorized representative who has indicated his/her understanding and acceptance.   Dental Advisory Given  Plan Discussed with: CRNA, Anesthesiologist and Surgeon  Anesthesia Plan Comments:      Anesthesia Quick Evaluation

## 2015-01-03 ENCOUNTER — Ambulatory Visit (HOSPITAL_BASED_OUTPATIENT_CLINIC_OR_DEPARTMENT_OTHER): Payer: BLUE CROSS/BLUE SHIELD | Admitting: Anesthesiology

## 2015-01-03 ENCOUNTER — Ambulatory Visit (HOSPITAL_BASED_OUTPATIENT_CLINIC_OR_DEPARTMENT_OTHER)
Admission: RE | Admit: 2015-01-03 | Discharge: 2015-01-03 | Disposition: A | Discharge status: Home / Self Care | Payer: BLUE CROSS/BLUE SHIELD | Source: Ambulatory Visit | Attending: Urology | Admitting: Urology

## 2015-01-03 ENCOUNTER — Encounter (HOSPITAL_BASED_OUTPATIENT_CLINIC_OR_DEPARTMENT_OTHER)
Admission: RE | Disposition: A | Discharge status: Home / Self Care | Payer: Self-pay | Source: Ambulatory Visit | Attending: Urology

## 2015-01-03 ENCOUNTER — Encounter (HOSPITAL_BASED_OUTPATIENT_CLINIC_OR_DEPARTMENT_OTHER): Payer: Self-pay

## 2015-01-03 DIAGNOSIS — C675 Malignant neoplasm of bladder neck: Secondary | ICD-10-CM

## 2015-01-03 HISTORY — PX: TRANSURETHRAL RESECTION OF BLADDER TUMOR: SHX2575

## 2015-01-03 SURGERY — TURBT (TRANSURETHRAL RESECTION OF BLADDER TUMOR)
Anesthesia: General | Site: Bladder | Laterality: Right

## 2015-01-03 MED ORDER — PROPOFOL 10 MG/ML IV BOLUS
INTRAVENOUS | Status: DC | PRN
Start: 2015-01-03 — End: 2015-01-03
  Administered 2015-01-03: 150 mg via INTRAVENOUS

## 2015-01-03 MED ORDER — MEPERIDINE HCL 25 MG/ML IJ SOLN
6.2500 mg | INTRAMUSCULAR | Status: DC | PRN
  Filled 2015-01-03: qty 1

## 2015-01-03 MED ORDER — OXYCODONE HCL 5 MG PO TABS
5.0000 mg | ORAL_TABLET | Freq: Once | ORAL | Status: DC | PRN
  Filled 2015-01-03: qty 1

## 2015-01-03 MED ORDER — SCOPOLAMINE 1 MG/3DAYS TD PT72
MEDICATED_PATCH | TRANSDERMAL | Status: DC | PRN
  Administered 2015-01-03: 1 via TRANSDERMAL

## 2015-01-03 MED ORDER — PROPOFOL INFUSION 10 MG/ML OPTIME
INTRAVENOUS | Status: DC | PRN
  Administered 2015-01-03: 25 ug/kg/min via INTRAVENOUS

## 2015-01-03 MED ORDER — SODIUM CHLORIDE 0.9 % IR SOLN
Status: DC | PRN
  Administered 2015-01-03: 3000 mL
  Administered 2015-01-03: 500 mL
  Administered 2015-01-03: 3000 mL

## 2015-01-03 MED ORDER — DEXAMETHASONE SODIUM PHOSPHATE 10 MG/ML IJ SOLN
INTRAMUSCULAR | Status: DC | PRN
  Administered 2015-01-03: 10 mg via INTRAVENOUS

## 2015-01-03 MED ORDER — MITOMYCIN CHEMO FOR BLADDER INSTILLATION 40 MG
40.0000 mg | Freq: Once | INTRAVENOUS | Status: AC
  Administered 2015-01-03: 40 mg via INTRAVESICAL
  Filled 2015-01-03: qty 40

## 2015-01-03 MED ORDER — CEFAZOLIN SODIUM-DEXTROSE 2-3 GM-% IV SOLR
INTRAVENOUS | Status: AC
  Filled 2015-01-03: qty 50

## 2015-01-03 MED ORDER — CIPROFLOXACIN HCL 500 MG PO TABS: 500.0000 mg | ORAL_TABLET | Freq: Two times a day (BID) | ORAL | Status: DC

## 2015-01-03 MED ORDER — MIDAZOLAM HCL 2 MG/2ML IJ SOLN
INTRAMUSCULAR | Status: AC
  Filled 2015-01-03: qty 2

## 2015-01-03 MED ORDER — SCOPOLAMINE 1 MG/3DAYS TD PT72
MEDICATED_PATCH | TRANSDERMAL | Status: AC
  Filled 2015-01-03: qty 1

## 2015-01-03 MED ORDER — MIDAZOLAM HCL 5 MG/5ML IJ SOLN
INTRAMUSCULAR | Status: DC | PRN
  Administered 2015-01-03: 2 mg via INTRAVENOUS

## 2015-01-03 MED ORDER — CEFAZOLIN SODIUM-DEXTROSE 2-3 GM-% IV SOLR
2.0000 g | INTRAVENOUS | Status: DC
  Filled 2015-01-03: qty 50

## 2015-01-03 MED ORDER — ONDANSETRON HCL 4 MG/2ML IJ SOLN
INTRAMUSCULAR | Status: DC | PRN
  Administered 2015-01-03: 4 mg via INTRAVENOUS

## 2015-01-03 MED ORDER — LIDOCAINE HCL (CARDIAC) 20 MG/ML IV SOLN
INTRAVENOUS | Status: DC | PRN
  Administered 2015-01-03: 50 mg via INTRAVENOUS

## 2015-01-03 MED ORDER — ACETAMINOPHEN 10 MG/ML IV SOLN
INTRAVENOUS | Status: DC | PRN
  Administered 2015-01-03: 1000 mg via INTRAVENOUS

## 2015-01-03 MED ORDER — FENTANYL CITRATE (PF) 100 MCG/2ML IJ SOLN
INTRAMUSCULAR | Status: DC | PRN
  Administered 2015-01-03: 50 ug via INTRAVENOUS

## 2015-01-03 MED ORDER — MITOMYCIN CHEMO FOR BLADDER INSTILLATION 40 MG: 40.0000 mg | Freq: Once | INTRAVENOUS | Status: DC

## 2015-01-03 MED ORDER — PROMETHAZINE HCL 25 MG/ML IJ SOLN
6.2500 mg | INTRAMUSCULAR | Status: DC | PRN
  Filled 2015-01-03: qty 1

## 2015-01-03 MED ORDER — HYDROMORPHONE HCL 1 MG/ML IJ SOLN
INTRAMUSCULAR | Status: AC
  Filled 2015-01-03: qty 1

## 2015-01-03 MED ORDER — FENTANYL CITRATE (PF) 100 MCG/2ML IJ SOLN
INTRAMUSCULAR | Status: AC
  Filled 2015-01-03: qty 4

## 2015-01-03 MED ORDER — ACETAMINOPHEN 10 MG/ML IV SOLN
1000.0000 mg | Freq: Once | INTRAVENOUS | Status: DC | PRN
  Filled 2015-01-03: qty 100

## 2015-01-03 MED ORDER — PHENAZOPYRIDINE HCL 200 MG PO TABS: 200.0000 mg | ORAL_TABLET | Freq: Three times a day (TID) | ORAL | Status: DC | PRN

## 2015-01-03 MED ORDER — KETOROLAC TROMETHAMINE 30 MG/ML IJ SOLN
INTRAMUSCULAR | Status: DC | PRN
  Administered 2015-01-03: 30 mg via INTRAVENOUS

## 2015-01-03 MED ORDER — OXYCODONE HCL 5 MG/5ML PO SOLN
5.0000 mg | Freq: Once | ORAL | Status: DC | PRN
  Filled 2015-01-03: qty 5

## 2015-01-03 MED ORDER — LACTATED RINGERS IV SOLN
INTRAVENOUS | Status: DC
  Administered 2015-01-03: 07:00:00 via INTRAVENOUS
  Filled 2015-01-03: qty 1000

## 2015-01-03 MED ORDER — TRAMADOL-ACETAMINOPHEN 37.5-325 MG PO TABS: 1.0000 | ORAL_TABLET | Freq: Four times a day (QID) | ORAL | Status: DC | PRN

## 2015-01-03 MED ORDER — HYDROMORPHONE HCL 1 MG/ML IJ SOLN
0.2500 mg | INTRAMUSCULAR | Status: DC | PRN
  Administered 2015-01-03: 0.5 mg via INTRAVENOUS
  Filled 2015-01-03: qty 1

## 2015-01-03 MED ORDER — CEFAZOLIN SODIUM-DEXTROSE 2-3 GM-% IV SOLR
INTRAVENOUS | Status: DC | PRN
  Administered 2015-01-03: 2 g via INTRAVENOUS

## 2015-01-03 MED ORDER — ONDANSETRON HCL 8 MG PO TABS: 8.0000 mg | ORAL_TABLET | Freq: Three times a day (TID) | ORAL | Status: DC | PRN

## 2015-01-03 SURGICAL SUPPLY — 28 items
BAG DRAIN URO-CYSTO SKYTR STRL (DRAIN) ×2 IMPLANT
BAG DRN ANRFLXCHMBR STRAP LEK (BAG)
BAG DRN UROCATH (DRAIN) ×1
BAG URINE DRAINAGE (UROLOGICAL SUPPLIES) IMPLANT
BAG URINE LEG 19OZ MD ST LTX (BAG) IMPLANT
BOOTIES KNEE HIGH SLOAN (MISCELLANEOUS) ×2 IMPLANT
CANISTER SUCT LVC 12 LTR MEDI- (MISCELLANEOUS) IMPLANT
CATH FOLEY 2WAY SLVR  5CC 18FR (CATHETERS) ×1
CATH FOLEY 2WAY SLVR  5CC 20FR (CATHETERS)
CATH FOLEY 2WAY SLVR  5CC 22FR (CATHETERS)
CATH FOLEY 2WAY SLVR 5CC 18FR (CATHETERS) IMPLANT
CATH FOLEY 2WAY SLVR 5CC 20FR (CATHETERS) IMPLANT
CATH FOLEY 2WAY SLVR 5CC 22FR (CATHETERS) IMPLANT
CLOTH BEACON ORANGE TIMEOUT ST (SAFETY) ×2 IMPLANT
ELECT REM PT RETURN 9FT ADLT (ELECTROSURGICAL) ×2
ELECTRODE REM PT RTRN 9FT ADLT (ELECTROSURGICAL) ×1 IMPLANT
GLOVE BIO SURGEON STRL SZ7.5 (GLOVE) ×2 IMPLANT
GOWN STRL REUS W/ TWL XL LVL3 (GOWN DISPOSABLE) ×1 IMPLANT
GOWN STRL REUS W/TWL XL LVL3 (GOWN DISPOSABLE) ×4
GOWN XL W/COTTON TOWEL STD (GOWNS) ×2 IMPLANT
HOLDER FOLEY CATH W/STRAP (MISCELLANEOUS) ×1 IMPLANT
IV NS IRRIG 3000ML ARTHROMATIC (IV SOLUTION) ×2 IMPLANT
LOOP CUT BIPOLAR 24F LRG (ELECTROSURGICAL) ×1 IMPLANT
MANIFOLD NEPTUNE II (INSTRUMENTS) ×1 IMPLANT
PACK CYSTO (CUSTOM PROCEDURE TRAY) ×2 IMPLANT
PLUG CATH AND CAP STER (CATHETERS) ×1 IMPLANT
SET ASPIRATION TUBING (TUBING) IMPLANT
SYRINGE IRR TOOMEY STRL 70CC (SYRINGE) ×1 IMPLANT

## 2015-01-03 NOTE — Anesthesia Postprocedure Evaluation (Signed)
Anesthesia Post Note  Patient: [name]  Procedure(s) Performed: Procedure(s) (LRB): TRANSURETHRAL RESECTION OF BLADDER TUMOR (TURBT) (Right)  Anesthesia type: General  Patient location: PACU  Post pain: Pain level controlled  Post assessment: Post-op Vital signs reviewed  Last Vitals: BP 131/73 mmHg  Pulse 67  Temp(Src) 36.5 C (Oral)  Resp 16  Ht 5' 2.5" (1.588 m)  Wt 127 lb (57.607 kg)  BMI 22.84 kg/m2  SpO2 100%  Post vital signs: Reviewed  Level of consciousness: sedated  Complications: No apparent anesthesia complications

## 2015-01-03 NOTE — Discharge Instructions (Addendum)
Bladder Cancer Bladder cancer is an abnormal growth of tissue in your bladder. Your bladder is the balloon-like sac in your pelvis. It collects and stores urine that comes from the kidneys through the ureters. The bladder wall is made of layers. If cancer spreads into these layers and through the wall of the bladder, it becomes more difficult to treat.  There are four stages of bladder cancer:  Stage I. Cancer at this stage occurs in the bladder's inner lining but has not invaded the muscular bladder wall.  Stage II. At this stage, cancer has invaded the bladder wall but is still confined to the bladder.  Stage III. By this stage, the cancer cells have spread through the bladder wall to surrounding tissue. They may also have spread to the prostate in men or the uterus or vagina in women.  Stage IV. By this stage, cancer cells may have spread to the lymph nodes and other organs, such as your lungs, bones, or liver. RISK FACTORS Although the cause of bladder cancer is not known, the following risk factors can increase your chances of getting bladder cancer:   Smoking.   Occupational exposures, such as rubber, leather, textile, dyes, chemicals, and paint.  Being white.  Age.   Being female.   Having chronic bladder inflammation.   Having a bladder cancer history.   Having a family history of bladder cancer (heredity).   Having had chemotherapy or radiation therapy to the pelvis.   Being exposed to arsenic.  SYMPTOMS   Blood in the urine.   Pain with urination.   Frequent bladder or urine infections.  Increase in urgency and frequency of urination. DIAGNOSIS  Your health care provider may suspect bladder cancer based on your description of urinary symptoms or based on the finding of blood or infection in the urine (especially if this has recurred several times). Other tests or procedures that may be performed include:   A narrow tube being inserted into your bladder  through your urethra (cystoscopy) in order to view the lining of your bladder for tumors.   A biopsy to sample the tumor to see if cancer is present.  If cancer is present, it will then be staged to determine its severity and extent. It is important to know how deeply into the bladder wall the cancer has grown and whether the cancer has spread to any other parts of your body. Staging may require blood tests or special scans such as a CT scan, MRI, bone scan, or chest X-ray.  TREATMENT  Once your cancer has been diagnosed and staged, you should discuss a treatment plan with your health care provider. Based on the stage of the cancer, one treatment or a combination of treatments may be recommended. The most common forms of treatment are:   Surgery. Procedures that may be done include transurethral resection and cystectomy.  Radiation therapy. This is infrequently used to treat bladder cancer.   Chemotherapy. During this treatment, drugs are used to kill cancer cells.  Immunotherapy. This is usually administered directly into the bladder. HOME CARE INSTRUCTIONS  Take medicines only as directed by your health care provider.   Maintain a healthy diet.   Consider joining a support group. This may help you learn to cope with the stress of having bladder cancer.   Seek advice to help you manage treatment side effects.   Keep all follow-up visits as directed by your health care provider.   Inform your cancer specialist if you are  admitted to the hospital.  [place] IF:  There is blood in your urine.  You have symptoms of a urinary tract infection. These include:  Tiredness.  Shakiness.  Weakness.  Muscle aches.  Abdominal pain.  Frequent and intense urge to urinate (in young women).  Burning feeling in the bladder or urethra during urination (in young women). SEEK IMMEDIATE MEDICAL CARE IF:  You are unable to urinate. Document Released: 05/30/2003 Document  Revised: 10/11/2013 Document Reviewed: 11/17/2012 [facility] Patient Information 2015 [place], [place]. This information is not intended to replace advice given to you by your health care provider. Make sure you discuss any questions you have with your health care provider.   CYSTOSCOPY HOME CARE INSTRUCTIONS  Activity: Rest for the remainder of the day.  Do not drive or operate equipment today.  You may resume normal activities in one to two days as instructed by your physician.   Meals: Drink plenty of liquids and eat light foods such as gelatin or soup this evening.  You may return to a normal meal plan tomorrow.  Return to Work: You may return to work in one to two days or as instructed by your physician.  Special Instructions / Symptoms: Call your physician if any of these symptoms occur:   -persistent or heavy bleeding  -bleeding which continues after first few urination  -large blood clots that are difficult to pass  -urine stream diminishes or stops completely  -fever equal to or higher than 101 degrees Farenheit.  -cloudy urine with a strong, foul odor  -severe pain  Females should always wipe from front to back after elimination.  You may feel some burning pain when you urinate.  This should disappear with time.  Applying moist heat to the lower abdomen or a hot tub bath may help relieve the pain. \  Follow-Up / Date of Return Visit to Your Physician:   Call for an appointment to arrange follow-up.  Patient Signature:  ________________________________________________________  Nurse's Signature:  ________________________________________________________    Post Anesthesia Home Care Instructions  Activity: Get plenty of rest for the remainder of the day. A responsible adult should stay with you for 24 hours following the procedure.  For the next 24 hours, DO NOT: -Drive a car -Paediatric nurse -Drink alcoholic beverages -Take any medication unless instructed by your  physician -Make any legal decisions or sign important papers.  Meals: Start with liquid foods such as gelatin or soup. Progress to regular foods as tolerated. Avoid greasy, spicy, heavy foods. If nausea and/or vomiting occur, drink only clear liquids until the nausea and/or vomiting subsides. Call your physician if vomiting continues.  Special Instructions/Symptoms: Your throat may feel dry or sore from the anesthesia or the breathing tube placed in your throat during surgery. If this causes discomfort, gargle with warm salt water. The discomfort should disappear within 24 hours.  If you had a scopolamine patch placed behind your ear for the management of post- operative nausea and/or vomiting:  1. The medication in the patch is effective for 72 hours, after which it should be removed.  Wrap patch in a tissue and discard in the trash. Wash hands thoroughly with soap and water. 2. You may remove the patch earlier than 72 hours if you experience unpleasant side effects which may include dry mouth, dizziness or visual disturbances. 3. Avoid touching the patch. Wash your hands with soap and water after contact with the patch.

## 2015-01-03 NOTE — H&P (Signed)
64 yo female with a hx of IC, returns today for a cystoscopy for hx of gross hematuria. She has minimal tobacco exposure of 10 pk/urs, and none x 40 yrs.    She was last seen by Jiles Crocker, NP on 11/04/14 with a one day history of dysuria and gross hematuria after drinking tea (bladder irritant). She had a negative full gross hematuria work up [place] in July 2015 and a negative cystoscopy in 8/15 for HOD; and again on 05/31/14. 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      S/P cysto/HOD (900 ml). Post dilatation multiple areas of ulceration.    Previously she noted severe abdominal pain with Myrbetriq, with gas, cramping, and diarrhea-exacerbation of her IBS; which abated when she stopped the Myrbetriq. However, she noted a decrease in her urinary urgency and bladder s-p pressure.   She has failed Gelnique because of urinary retention.      Hx of IC, complaining of urgency and s-p pressure over the last 6 months. She is s/p cysto/HOD on 03/12/10. She had a period after the surgery that she felt great, but then started having pelvic floor spasms for which she used Gelnique for a little while which did help. She is not currently using Gelnique because she notice her hair thinning, ( strong family history of alopecia) and was but seems to be doing well w/o the medication until December. She was 30-45% better than before her surgery. She states that she is no longer able to have intercourse w/o having pain. Current PUFF= 16    Hx of a 15 year history of urgency, frequency, nocturia, pressure, and abdominal bloating. She had cystoscopy per Dr. Serita Butcher 15 years ago, and currently complains of frequency urgency and feeling of urinary tract infection. Her puff score is 21. Note her allergy to Levsinex and hyoscyamine. The patient complains of dyspareunia, urgency, and vaginal dryness. She believes she may have fibromyalgia, GERD with IBS, spastic colon, and depression.  She is post hysterectomy. No migraines. + anxiety.      Urodynamics shows a maximum bladder capacity of 118 cc, with first sensation at 21 cc, normal desire at 57 cc and strong desire at 77 cc. The bladder is otherwise stable. She did not leak for leak point pressure evaluation, an abdominal pressure of 107 cm water.     Pressure flow study is accomplished, shows a flow rate of 4 cc per second, with maximum detrusor pressure of 17 cm water. PVR is zero.     This patient appears to have severe sensory urgency. There is no stability or stress incontinence noted. She was able to void voluntarily with zero glucose 10.     Has now started PT with W. [facility]. Initial session 05/12/14.   Past Medical History Problems  1. History of Anxiety (F41.9) 2. History of Arthritis 3. History of Asthma (J45.909) 4. History of cardiac disorder (Z86.79) 5. History of esophageal reflux (Z87.19) 6. History of gout (Z87.39) 7. History of hypercholesterolemia (Z86.39) 8. History of hypertension (Z86.79) 9. History of transient cerebral ischemia (V42.59)  Surgical History Problems  1. History of Back Surgery 2. History of Biopsy Breast Open 3. History of Bladder Irrigation 4. History of Bladder Irrigation 5. History of Cystoscopy With Biopsy 6. History of Cystoscopy With Dilation Of Bladder 7. History of Cystoscopy With Dilation Of Bladder 8. History of Hysterectomy 9. History of Ovarian Surgery 10. History of Shoulder Surgery Left  Current  Meds 1. Acidophilus 500 MG CAPS; Therapy: (Recorded:01Jul2015) to Recorded 2. Acyclovir 400 MG Oral Tablet; Therapy: (Recorded:01Jul2015) to Recorded 3. Aspirin 81 MG TABS; Therapy: (Recorded:01Jul2015) to Recorded 4. Ativan 0.5 MG Oral Tablet; Therapy: (Recorded:01Jul2015) to Recorded 5. Atorvastatin Calcium 20 MG Oral Tablet; Therapy: 23Dec2014 to Recorded 6. Climara 0.025 MG/24HR Transdermal Patch Weekly; Therapy:  (Recorded:01Jul2015) to Recorded 7. Dicyclomine HCl - 10 MG Oral Capsule; TAKE CAPSULE PRN; Therapy: (Recorded:01Jul2015) to Recorded 8. Diltiazem HCl SOLN; Therapy: (Recorded:01Jul2015) to Recorded 9. Doxylamine Succinate 25 MG TABS; Therapy: (Recorded:01Jul2015) to Recorded 10. Fluticasone Propionate 50 MCG/ACT Nasal Suspension;  Therapy: (Recorded:01Jul2015) to Recorded 11. Gelnique 10 % Transdermal Gel; use on arms or abdomen or thighs each day;  Therapy: [id number] to (Evaluate:17Feb2016) Requested for: 22Jul2015; Last  Rx:22Jul2015 Ordered 12. Librax 5-2.5 MG Oral Capsule; TAKE CAPSULE PRN;  Therapy: (Recorded:01Jul2015) to Recorded 13. LORazepam 0.5 MG Oral Tablet; TAKE TABLET PRN;  Therapy: (Recorded:01Jul2015) to Recorded 14. Multi Vitamin/Minerals TABS;  Therapy: (Recorded:01Jul2015) to Recorded 15. Myrbetriq 50 MG Oral Tablet Extended Release 24 Hour; Take 1 tablet daily;  Therapy: [id number] to (Evaluate:27Jan2016); Last Rx:01Jul2015 Ordered 16. NexIUM 40 MG Oral Capsule Delayed Release;  Therapy: (Recorded:01Jul2015) to Recorded 17. Nitroglycerin 0.4 MG SUBL;  Therapy: (Recorded:01Jul2015) to Recorded 18. Oxycodone-Acetaminophen 5-325 MG Oral Tablet;  Therapy: (Recorded:01Jul2015) to Recorded 19. Ranexa 500 MG Oral Tablet Extended Release 12 Hour;  Therapy: (Recorded:01Jul2015) to Recorded 20. Tart Cherry Advanced CAPS;  Therapy: (Recorded:01Jul2015) to Recorded 21. Temazepam 15 MG Oral Capsule;  Therapy: (Recorded:01Jul2015) to Recorded 22. TraMADol HCl - 50 MG Oral Tablet; TAKE 1 TABLET Every 6 hours; Last Rx:07Aug2015  Ordered 23. Turmeric CAPS;  Therapy: (Recorded:01Jul2015) to Recorded 24. Vayacog CAPS;  Therapy: (Recorded:01Jul2015) to Recorded 25. Vitamin D 1000 UNIT Oral Tablet;  Therapy: (Recorded:01Jul2015) to Recorded  Allergies Medication  1. Doxycycline Hyclate CAPS 2. Hydrocodone-Acetaminophen TABS 3. Hyoscyamine TABS 4.  Levsinex CP12 5. Lexapro TABS 6. Nitrofurantoin Monohyd Macro CAPS 7. TraZODone HCl TABS 8. clindamycin  Family History Problems  1. Family history of Alzheimer's Disease 2. Family history of Death In The Family Father  passed at 31 from heart disease 3. Family history of Family Health Status - Mother's Age  43 4. Family history of Family Health Status Children ___ Living Daughters  1 5. Family history of Family Health Status Children ___ Living Sons  1 6. Family history of Heart Disease : Father 43. Family history of Heart Disease : Mother 8. Family history of Heart Disease : Brother  Social History Problems  1. Activities Of Daily Living 2. Alcohol Use  1-2 per week 3. Denied: History of Caffeine Use 4. Exercises regularly  She is working out at the gym 3-4 days a week. 5. Former smoker [phone])  smoked 1 ppd for 10 years; quit 37 years ago 12. Living Independently With Spouse 7. Marital History - Currently Married 8. 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 Self-reliant In Usual Daily Activities  Review of Systems Genitourinary, constitutional, skin, eye, otolaryngeal, hematologic/lymphatic, cardiovascular, pulmonary, endocrine, musculoskeletal, gastrointestinal, neurological and psychiatric system(s) were reviewed and pertinent findings if present are noted and are otherwise negative.  Genitourinary: hematuria.   Vitals Vital Signs [Data Includes: Last 1 Day]  Recorded: [id number] 09:14AM  Blood Pressure: 133 / 73 Temperature: 97.4 F Heart Rate: 62  Results/Data Urine [Data Includes: Last 1 Day]   [id number]  COLOR YELLOW   APPEARANCE CLEAR   SPECIFIC GRAVITY 1.025   pH 6.5  GLUCOSE NEG mg/dL  BILIRUBIN NEG   KETONE NEG mg/dL  BLOOD SMALL   PROTEIN NEG mg/dL  UROBILINOGEN 0.2 mg/dL  NITRITE NEG   LEUKOCYTE ESTERASE TRACE   SQUAMOUS EPITHELIAL/HPF MODERATE   WBC 3-6  WBC/hpf  RBC 7-10 RBC/hpf  BACTERIA FEW   CRYSTALS NONE SEEN   CASTS NONE SEEN    Procedure  Procedure: Cystoscopy  Chaperone Present: laura.  Indication: Hematuria.  Informed Consent: Risks, benefits, and potential adverse events were discussed and informed consent was obtained from the patient.  Prep: The patient was prepped with betadine.  Anesthesia:. Local anesthesia was administered intraurethrally with 2% lidocaine jelly.  Antibiotic prophylaxis: Ciprofloxacin.  Procedure Note:  Urethral meatus:. No abnormalities.  Anterior urethra: No abnormalities.  Bladder: Visulization was clear. Examination of the bladder demonstrated no clot within the bladder and no trabeculation no erythematous mucosa,no edemaandno cellules. Multiple tumors were identified in the bladder. A papillary tumor was seen in the bladder measuring approximately 2 cm in size. This tumor was located on the right side, on the anterior aspect, near the trigone of the bladder. Another papillary tumor was seen in the bladder measuring approximately 2 cm in size. This tumor was located on the right side, on the anterior aspect, at the neck of the bladder. An additional papillary tumor was seen in the bladder. This tumor was located on the right side, on the anterior aspect, at the base of the bladder.   Assessment Assessed  1. Gross hematuria (R31.0) 2. Female pelvic pain (R10.2)  Gross painless hematuria 2ndary to right sided tumors ( ? TCC). She has only a small, remote tobacco hx.   Plan Health Maintenance  1. UA With REFLEX; [Do Not Release]; Status:Resulted - Requires Verification; Done: [id number] 08:59AM  TUR-BT of right sided bladder masses. Concern if ro TCC.   Signatures  Surgery July 24 cancelled 2ndary complicated surgery in same OR room prior to her case, and now re-scheduled for 07:30 on following AM.

## 2015-01-03 NOTE — Interval H&P Note (Signed)
History and Physical Interval Note:  01/03/2015 6:59 AM  Clenton Pare  has presented today for surgery, with the diagnosis of BLADDER TUMORS  The various methods of treatment have been discussed with the patient and family. After consideration of risks, benefits and other options for treatment, the patient has consented to  Procedure(s): TRANSURETHRAL RESECTION OF BLADDER TUMOR (TURBT) (Right) as a surgical intervention .  The patient's history has been reviewed, patient examined, no change in status, stable for surgery.  I have reviewed the patient's chart and labs.  Questions were answered to the patient's satisfaction.    Urology Attending Note: Discussed with patient yesterday: For TURBT this AM. Pt has asked for Mitomycin C  to be added, and this seems reasonable. She understands that we donot have patholoty diagnosis yet, and that she is at low reis for TCC, except for cystoscopic findings. No hx of smoking, no family hx, no toxic exposure.   Caira Poche I Shardea Cwynar

## 2015-01-03 NOTE — Transfer of Care (Signed)
Immediate Anesthesia Transfer of Care Note  Patient: [name]  Procedure(s) Performed: Procedure(s): TRANSURETHRAL RESECTION OF BLADDER TUMOR (TURBT) (Right)  Patient Location: PACU  Anesthesia Type:General  Level of Consciousness: awake, alert , oriented and patient cooperative  Airway & Oxygen Therapy: Patient Spontanous Breathing and Patient connected to nasal cannula oxygen  Post-op Assessment: Report given to RN and Post -op Vital signs reviewed and stable  Post vital signs: Reviewed and stable  Last Vitals:  Filed Vitals:   01/03/15 0625  BP: 133/54  Pulse: 60  Temp: 36.9 C  Resp: 16    Complications: No apparent anesthesia complications

## 2015-01-03 NOTE — Op Note (Signed)
Pre-operative diagnosis :   Multiple bladder tumors (7 semiregular aggregate, right bladder base, extending from pre-trigone to bladder neck, excluding right trigone  Postoperative diagnosis:  Same  Operation: Cystourethroscopy, transurethral resection of multiple right bladder base bladder tumors, 7 cm in aggregate, extending from the right bladder base to the right bladder neck, excluding the right ureteral orifice and right trigone.    Surgeon:  Chauncey Cruel. Gaynelle Arabian, MD  First assistant:  None   Anesthesia:  Gen. LMA   Preparation:  After appropriate preanesthesia, the patient brought the operating room, placed on the operating table in the dorsal supine position where general LMA anesthesia was induced. She was then placed in dorsal lithotomy position with the pubis was prepped with Betadine solution and draped in usual fashion.   Review history:  64 yo female with a hx of IC, returns today for a cystoscopy for hx of gross hematuria. She has minimal tobacco exposure of 10 pk/urs, and none x 40 yrs.    She was last seen by Jiles Crocker, NP on 11/04/14 with a one day history of dysuria and gross hematuria after drinking tea (bladder irritant). She had a negative full gross hematuria work up [place] in July 2015 and a negative cystoscopy in 8/15 for HOD; and again on 05/31/14. 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 There is no history of tobacco exposure. There is no family history of TCC. There is no history of environmental chemical exposure.   Statement of  Likelihood of Success: Excellent.   TIME-OUT observed.:  Procedure:  Cystourethroscopy the [place], and noted that the patient's floor was within normal limits. The ureteral orifice was normal. However, within the bladder neck, on the right side, multiple bladder tumors were identified immediately. These bladder tumors had bled, and were pink and white combinations. The bladder tumors measured 7 cm in aggregate. This  extended from above the right trigone, to the bladder neck, but spared the trigone and ureteral orifice ease. Clear reflux was seen from the right ureteral orifice. The remaining bladder appeared within normal limits, and no other tumors could be identified.  Resection was accomplished from the 9:00 to the 6:00 position, and all tumors were excised. The base was electrocoagulated. The tumors appeared to be superficial. Minimal bleeding was noted. An 69 French Foley catheter was placed with 10 mL in the balloon. Mitomycin-C will be placed in the recovery room. The patient was awakened after given IV Toradol and IV Tylenol. She was taken to recovery room in good condition.

## 2015-01-03 NOTE — Anesthesia Procedure Notes (Signed)
Procedure Name: LMA Insertion Date/Time: 01/03/2015 7:39 AM Performed by: Wanita Chamberlain Pre-anesthesia Checklist: Patient identified, Timeout performed, Emergency Drugs available, Suction available and Patient being monitored Patient Re-evaluated:Patient Re-evaluated prior to inductionOxygen Delivery Method: Circle system utilized Preoxygenation: Pre-oxygenation with 100% oxygen Intubation Type: IV induction Ventilation: Mask ventilation without difficulty LMA: LMA inserted LMA Size: 4.0 Number of attempts: 1 Airway Equipment and Method: Bite block Placement Confirmation: breath sounds checked- equal and bilateral and positive ETCO2 Tube secured with: Tape Dental Injury: Teeth and Oropharynx as per pre-operative assessment

## 2015-01-04 ENCOUNTER — Encounter (HOSPITAL_BASED_OUTPATIENT_CLINIC_OR_DEPARTMENT_OTHER): Payer: Self-pay | Admitting: Urology

## 2015-01-09 ENCOUNTER — Telehealth: Payer: Self-pay

## 2015-01-09 NOTE — Telephone Encounter (Signed)
Signed cardiac clearance place in MR nurse fax box to be faxed to [facility]

## 2015-01-17 ENCOUNTER — Telehealth: Payer: Self-pay | Admitting: Interventional Cardiology

## 2015-01-17 NOTE — Telephone Encounter (Signed)
New message  Pt called states that she has aggressive bladder cancer and she wanted the office to be aware that [facility] would be in contact with him to discuss if the bladder should be removed. Pt requests a call back to discuss if a sooner appt is needed pt pt req. She canceled the August 12th appt because the time wasn't best for her. She is scheduled for an October appt but given the severity of her recent diagnosis. Pt request to be seen sooner. Please assist

## 2015-01-19 NOTE — Telephone Encounter (Signed)
Returned pt call. lmtcb X2 on both tel #'s

## 2015-01-19 NOTE — Telephone Encounter (Signed)
Follow up      Pt returning your call Please call pt on cell number; pt out of town

## 2015-01-20 ENCOUNTER — Ambulatory Visit: Payer: BLUE CROSS/BLUE SHIELD | Admitting: Interventional Cardiology

## 2015-01-20 NOTE — Telephone Encounter (Signed)
Returned pt call. Pt sts that she is scheduled for sx soon with "Dr.Johnson" @ [facility]. She has been dx with an aggressive bladder cancer. She  will not need cardiac clearance since Dr.Smith has recently cleared her for sx with Dr.Tannebaum. She is doing ok from cardiac standpoint. She wanted Dr.Smith to be updated. Adv her I will fwd him the message. She was scheduled to see Dr.Smith on 8/12, she has  rescheduled her appt with Dr.Smith for Oct  Adv her that once she is up to it. She should call the office we will work her in for a sooner appt. Pt appreciative for the call back and verbalized understanding

## 2015-02-06 ENCOUNTER — Other Ambulatory Visit: Payer: Self-pay | Admitting: *Deleted

## 2015-02-06 MED ORDER — RANOLAZINE ER 500 MG PO TB12: 500.0000 mg | ORAL_TABLET | Freq: Two times a day (BID) | ORAL | Status: DC

## 2015-02-08 ENCOUNTER — Telehealth: Payer: Self-pay | Admitting: *Deleted

## 2015-02-08 NOTE — Telephone Encounter (Signed)
Called Ms. Murdy to determine if she is a prvious patient after voicemail requesting BCG Immunotherapy treatment done at [facility].  "I was diagnosed with bladder cancer by urologist.  Martin Majestic to [facility] and [facility] for second opinions.  They want to do Bacillus Calmette-Guerin treatment and I want to have this done in [place]."  This nurse informed her she would need a referral to establish care with a [facility] doctor.

## 2015-02-10 ENCOUNTER — Other Ambulatory Visit: Payer: Self-pay

## 2015-02-10 DIAGNOSIS — Z1231 Encounter for screening mammogram for malignant neoplasm of breast: Secondary | ICD-10-CM

## 2015-03-22 ENCOUNTER — Ambulatory Visit
Admission: RE | Admit: 2015-03-22 | Discharge: 2015-03-22 | Disposition: A | Discharge status: Home / Self Care | Payer: BLUE CROSS/BLUE SHIELD | Source: Ambulatory Visit

## 2015-03-22 ENCOUNTER — Other Ambulatory Visit: Payer: Self-pay | Admitting: Interventional Cardiology

## 2015-03-22 DIAGNOSIS — Z1231 Encounter for screening mammogram for malignant neoplasm of breast: Secondary | ICD-10-CM

## 2015-03-22 MED ORDER — NITROGLYCERIN 0.4 MG SL SUBL: SUBLINGUAL_TABLET | SUBLINGUAL | Status: DC

## 2015-04-03 ENCOUNTER — Telehealth: Payer: Self-pay | Admitting: Internal Medicine

## 2015-04-03 NOTE — Telephone Encounter (Signed)
Patient with terrible reflux and heartburn she feels is connected to her cancer treatments.   She will come in and see Alonza Bogus, PA on 04/06/15 10:00

## 2015-04-06 ENCOUNTER — Telehealth: Payer: Self-pay | Admitting: Gastroenterology

## 2015-04-06 ENCOUNTER — Encounter: Payer: Self-pay | Admitting: Interventional Cardiology

## 2015-04-06 ENCOUNTER — Ambulatory Visit (INDEPENDENT_AMBULATORY_CARE_PROVIDER_SITE_OTHER): Payer: BLUE CROSS/BLUE SHIELD | Admitting: Interventional Cardiology

## 2015-04-06 ENCOUNTER — Ambulatory Visit (INDEPENDENT_AMBULATORY_CARE_PROVIDER_SITE_OTHER): Payer: BLUE CROSS/BLUE SHIELD | Admitting: Gastroenterology

## 2015-04-06 ENCOUNTER — Encounter: Payer: Self-pay | Admitting: Gastroenterology

## 2015-04-06 VITALS — BP 106/60 | HR 64 | Ht 62.5 in | Wt 129.5 lb

## 2015-04-06 VITALS — BP 122/72 | HR 65 | Ht 62.5 in | Wt 128.0 lb

## 2015-04-06 DIAGNOSIS — I25119 Atherosclerotic heart disease of native coronary artery with unspecified angina pectoris: Secondary | ICD-10-CM

## 2015-04-06 DIAGNOSIS — K219 Gastro-esophageal reflux disease without esophagitis: Secondary | ICD-10-CM

## 2015-04-06 DIAGNOSIS — I6350 Cerebral infarction due to unspecified occlusion or stenosis of unspecified cerebral artery: Secondary | ICD-10-CM

## 2015-04-06 DIAGNOSIS — E785 Hyperlipidemia, unspecified: Secondary | ICD-10-CM | POA: Diagnosis not present

## 2015-04-06 DIAGNOSIS — I10 Essential (primary) hypertension: Secondary | ICD-10-CM | POA: Diagnosis not present

## 2015-04-06 DIAGNOSIS — K594 Anal spasm: Secondary | ICD-10-CM | POA: Diagnosis not present

## 2015-04-06 MED ORDER — ESOMEPRAZOLE MAGNESIUM 40 MG PO CPDR: DELAYED_RELEASE_CAPSULE | ORAL | Status: DC

## 2015-04-06 MED ORDER — RANOLAZINE ER 500 MG PO TB12: 500.0000 mg | ORAL_TABLET | Freq: Two times a day (BID) | ORAL | Status: DC

## 2015-04-06 MED ORDER — SUCRALFATE 1 GM/10ML PO SUSP: 1.0000 g | Freq: Three times a day (TID) | ORAL | Status: DC

## 2015-04-06 MED ORDER — DILTIAZEM GEL 2 %: 1.0000 "application " | CUTANEOUS | Status: DC | PRN

## 2015-04-06 NOTE — Patient Instructions (Addendum)
We sent Diltiazem gel 2 % to [facility], [facility].  We sent prescriptions to Carafate and Nexium 40 mg to [employer] [facility].

## 2015-04-06 NOTE — Telephone Encounter (Signed)
LM for the patient apologizing that we did not send the name brand Nexium.  I resent the prescription  For the name brand" Nexium 40 mg". # 180 with 3 refills.  [facility]

## 2015-04-06 NOTE — Telephone Encounter (Signed)
Sent name brand Nexium 40 mg, twice daily, # 180 with 3 refills. [facility], [address]

## 2015-04-06 NOTE — Progress Notes (Addendum)
Cardiology Office Note   Date:  04/06/2015   ID:  [name], DOB 1950-10-16, MRN [id number]  PCP:  Lottie Dawson, MD  Cardiologist:  Sinclair Grooms, MD   Chief Complaint  Patient presents with  . Follow-up    No complaints. Medication list reviewed.       History of Present Illness: [name] is a 64 y.o. female who presents for diffuse CAD and cerebrovascular disease, memory impairment, hyperlipidemia, essential hypertension, and anxiety disorder.  [name] is doing well. She is finishing up an 18 month treatment regimen for bladder cancer. Throughout all of her treatments and evaluation she has not had particular cardiac issues. She is using Ranexa intermittently. She does not need nitroglycerin on any regular basis.  She is having increasing episodes of esophageal spasm. She can discern that this discomfort in the chest is different than that that occurs with exercise. She uses Valium for the episodes and it improves.  Past Medical History  Diagnosis Date  . GERD (gastroesophageal reflux disease)   . IBS (irritable bowel syndrome)     Diarrhea predominent, esophageal spasm severe, GERD - Dr. Elicia Lamp  . Fatty liver 04/28/12  . Hyperlipidemia   . Essential hypertension, benign   . Interstitial cystitis     Dr. Gaynelle Arabian  . Memory loss     Psychiatry Dr. Stan Head, concerns for memory loss, anxiety  . Anxiety     Psychiatry Dr. Stan Head, concerns for memory loss, anxiety  . PONV (postoperative nausea and vomiting)     AND URINARY RETENTION  . Heart murmur   . History of esophageal spasm     takes ranexa  . Esophageal motility disorder   . Chronic chest pain   . History of TIA (transient ischemic attack)     2011--  no residuals  . Sensation of pressure in bladder area   . Wears glasses   . TMJ (temporomandibular joint syndrome)   . H/O esophageal spasm   . Bladder tumor   . Coronary atherosclerosis CARDIOLOGIST-  DR Daneen Schick    Cath 11/2006 demonstrating moderate obstructive coronary disease with 70-80% septal perforator #1, 50-70% first diagonal, 80% third diagonal, 50% mid and distal LAD & heavy calcification throughout all 3 coronaries. LVF normal.    Past Surgical History  Procedure Laterality Date  . Esophagogastroduodenoscopy  04/30/2012    Procedure: ESOPHAGOGASTRODUODENOSCOPY (EGD);  Surgeon: Lafayette Dragon, MD;  Location: [facility];  Service: Endoscopy;  Laterality: N/A;  . Shoulder arthroscopy Left 12-23-2011  . Lumbar disc surgery  1973  . Appendectomy  1971    AND OVARIAN CYSTECTOMY  . Benign excisional breast bx  1996  . Cysto/  hydrodistention/  instillation therapy  03-12-2010  . Abdominal hysterectomy  AGE 66 -- 1989  . Transthoracic echocardiogram  04-21-2012    GRADE I DIASTOLIC DYSFUNCTION/  EF 60-65%/  MILD MR  &  TR  . Negative sleep study  2010  . Cardiac catheterization  11-28-2006   DR Daneen Schick     moderate cad/  70-80% septal perforator #1, 50-70% first diagonal, 80% third diagonal, 50% mid and distal LAD & heavy calcification throughout all 3 coronaries. LVF normal.  . Cardiac catheterization  03-26-2012   DR Daneen Schick    patent CFX,  RCA,  & pLAD/  mLAD >50% to <70% a focal eccentric region that overlaps the third diagonal/  90% diagonal ostial and unchanged from prior study /  ef 65%  . Cysto with hydrodistension N/A 01/14/2014    Procedure: CYSTOSCOPY/HYDRODISTENSION/INSERTION OF MARCAINE AND PYRIDUIM INTO BLADDER/INJECTION OF MARCAINE AND KENALOG into sub trigone space ;  Surgeon: Ailene Rud, MD;  Location: [facility];  Service: Urology;  Laterality: N/A;  . Transurethral resection of bladder tumor Right 01/03/2015    Procedure: TRANSURETHRAL RESECTION OF BLADDER TUMOR (TURBT);  Surgeon: Carolan Clines, MD;  Location: [facility];  Service: Urology;  Laterality: Right;     Current Outpatient Prescriptions  Medication Sig  Dispense Refill  . acyclovir (ZOVIRAX) 400 MG tablet Take 400 mg by mouth 2 (two) times daily.    Marland Kitchen ALPRAZolam (XANAX) 0.25 MG tablet Take 0.25 mg by mouth at bedtime as needed for anxiety.    Marland Kitchen aspirin 81 MG tablet Take 81 mg by mouth at bedtime.     Marland Kitchen atorvastatin (LIPITOR) 20 MG tablet Take 20 mg by mouth at bedtime.     . calcium carbonate (TUMS - DOSED IN MG ELEMENTAL CALCIUM) 500 MG chewable tablet Chew 1 tablet by mouth as needed for indigestion or heartburn.    . Cholecalciferol (VITAMIN D) 1000 UNITS capsule Take 1,000 Units by mouth daily.    . ciprofloxacin (CIPRO) 500 MG tablet Take 1 tablet (500 mg total) by mouth 2 (two) times daily. 6 tablet 0  . clidinium-chlordiazePOXIDE (LIBRAX) 5-2.5 MG capsule Take 1 capsule by mouth 3 (three) times daily before meals.    . Coenzyme Q10 (COQ10) 100 MG CAPS Take 100 mg by mouth daily.     . diazepam (VALIUM) 2 MG tablet Take 2 mg by mouth 2 (two) times daily.    Marland Kitchen dicyclomine (BENTYL) 10 MG capsule Take 1 capsule (10 mg total) by mouth 3 (three) times daily as needed. For IBS 270 capsule 3  . diltiazem 2 % GEL Apply 1 application topically as needed. For rectal spasm    . doxylamine, Sleep, (UNISOM) 25 MG tablet Take 25 mg by mouth at bedtime.     Marland Kitchen esomeprazole (NEXIUM) 40 MG capsule Take 40 mg by mouth daily before breakfast.    . estradiol (CLIMARA - DOSED IN MG/24 HR) 0.025 mg/24hr patch Place 0.025 mg onto the skin once a week.    . fluticasone (FLONASE) 50 MCG/ACT nasal spray Place into both nostrils as needed for allergies or rhinitis.    . hydrOXYzine (ATARAX/VISTARIL) 25 MG tablet Take 25 mg by mouth as needed.    . loperamide (IMODIUM A-D) 2 MG tablet Take 2 mg by mouth as needed for diarrhea or loose stools.    Marland Kitchen LORazepam (ATIVAN) 0.5 MG tablet Take three times a day as needed 30 tablet 0  . nitroGLYCERIN (NITROSTAT) 0.4 MG SL tablet place 1 tablet under the tongue if needed every 5 minutes for chest pain for 3 doses IF NO RELIEF  AFTER 3RD DOSE CALL PRESCRIBER OR 911. 75 tablet 0  . ondansetron (ZOFRAN) 8 MG tablet Take 1 tablet (8 mg total) by mouth every 8 (eight) hours as needed for nausea or vomiting (1/2 tablet-1 tablet). 20 tablet 0  . oxybutynin (DITROPAN) 5 MG tablet Take 5 mg by mouth as needed for bladder spasms.    . phenazopyridine (PYRIDIUM) 200 MG tablet Take 1 tablet (200 mg total) by mouth 3 (three) times daily as needed for pain. 30 tablet 3  . Probiotic Product (PROBIOTIC ACIDOPHILUS) CAPS Take 1 capsule by mouth daily.    . ranolazine (RANEXA) 500 MG  12 hr tablet Take 1 tablet (500 mg total) by mouth 2 (two) times daily. 60 tablet 1  . temazepam (RESTORIL) 15 MG capsule Take 1 capsule by mouth once a week. Does not take temazepam on a specific day    . traMADol-acetaminophen (ULTRACET) 37.5-325 MG per tablet Take 1 tablet by mouth every 6 (six) hours as needed. 30 tablet 2   No current facility-administered medications for this visit.    Allergies:   Clindamycin/lincomycin; Doxycycline; Escitalopram oxalate; Hydrocodone; Levsin; Macrodantin; Trazodone and nefazodone; and Trimethoprim    Social History:  The patient  reports that she quit smoking about 41 years ago. Her smoking use included Cigarettes. She quit after 10 years of use. She has never used smokeless tobacco. She reports that she drinks alcohol. She reports that she does not use illicit drugs.   Family History:  The patient's family history includes Alzheimer's disease in her mother; CAD in her father and mother; Colon polyps in her mother; Dementia in her mother; Depression in her other; Diverticulosis in her father; Heart disease in her brother, father, and mother; Obesity in her other; Osteoarthritis in her other. There is no history of Colon cancer.    ROS:  Please see the history of present illness.   Otherwise, review of systems are positive for memory disturbance, hematuria, bladder cancer with BCG infusion therapy, and depression..    All other systems are reviewed and negative.    PHYSICAL EXAM: VS:  BP 122/72 mmHg  Pulse 65  Ht 5' 2.5" (1.588 m)  Wt 58.06 kg (128 lb)  BMI 23.02 kg/m2 , BMI Body mass index is 23.02 kg/(m^2). GEN: Well nourished, well developed, in no acute distress HEENT: normal Neck: no JVD, carotid bruits, or masses Cardiac: RRR.  There is no murmur, rub, or gallop. There is no edema. Respiratory:  clear to auscultation bilaterally, normal work of breathing. GI: soft, nontender, nondistended, + BS MS: no deformity or atrophy Skin: warm and dry, no rash Neuro:  Strength and sensation are intact Psych: euthymic mood, full affect   EKG:  EKG is not ordered today. The ekg revealed normal sinus rhythm with RSR prime in V1 in April 2016.   Recent Labs: 01/02/2015: Hemoglobin 13.3; Potassium 4.0; Sodium 142    Lipid Panel    Component Value Date/Time   CHOL  09/30/2009 0447    128        ATP III CLASSIFICATION:  <200     mg/dL   Desirable  200-239  mg/dL   Borderline High  >=240    mg/dL   High          TRIG 126 09/30/2009 0447   HDL 43 09/30/2009 0447   CHOLHDL 3.0 09/30/2009 0447   VLDL 25 09/30/2009 0447   LDLCALC  09/30/2009 0447    60        Total Cholesterol/HDL:CHD Risk Coronary Heart Disease Risk Table                     Men   Women  1/2 Average Risk   3.4   3.3  Average Risk       5.0   4.4  2 X Average Risk   9.6   7.1  3 X Average Risk  23.4   11.0        Use the calculated Patient Ratio above and the CHD Risk Table to determine the patient's CHD Risk.  ATP III CLASSIFICATION (LDL):  <100     mg/dL   Optimal  100-129  mg/dL   Near or Above                    Optimal  130-159  mg/dL   Borderline  160-189  mg/dL   High  >190     mg/dL   Very High      Wt Readings from Last 3 Encounters:  04/06/15 58.06 kg (128 lb)  01/03/15 57.607 kg (127 lb)  01/02/15 57.153 kg (126 lb)      Other studies Reviewed: Additional studies/ records that were reviewed  today include: None pertinent to cardiology. The findings include none.    ASSESSMENT AND PLAN:  1. Coronary artery disease involving native coronary artery of native heart with angina pectoris (HCC) Stable without progression of exertional complaints  2. Cerebral artery occlusion with cerebral infarction ([place]) No new symptoms  3. Hyperlipidemia On therapy with monitoring by primary care  4. Essential hypertension Excellent control  5. Recurring esophageal spasm which produces chest pain and is responsive to sublingual Valium  Current medicines are reviewed at length with the patient today.  The patient has the following concerns regarding medicines: none.  The following changes/actions have been instituted:    Aerobic activity  It is okay to use a Ranexa when necessary  Call if increasing chest pain  Labs/ tests ordered today include:  No orders of the defined types were placed in this encounter.     Disposition:   FU with HS in 1 year  Signed, Sinclair Grooms, MD  04/06/2015 8:49 AM    [address], [place], [place]  [address] Phone: [phone]; Fax: [phone]

## 2015-04-06 NOTE — Patient Instructions (Signed)
Your physician recommends that you continue on your current medications as directed. Please refer to the Current Medication list given to you today. Your physician wants you to follow-up in: 1 year with Dr. Tamala Julian.  You will receive a reminder letter in the mail two months in advance. If you don't receive a letter, please call our office to schedule the follow-up appointment.  Your physician would like you to stay as active as possible.  Aerobic activity is recommended as a good form of exercise.

## 2015-04-11 DIAGNOSIS — K594 Anal spasm: Secondary | ICD-10-CM | POA: Insufficient documentation

## 2015-04-11 NOTE — Progress Notes (Signed)
     04/11/2015 [name] [id number] 07/17/1950   History of Present Illness:  This is a 64 year old female who is a long-standing patient of Dr. Nichola Sizer with extensive GI history. She has long-standing reflux and esophageal spasms. She is currently on Nexium 40 mg daily, but does take it twice daily when she feels that she gets reflux flares. She uses sublingual Ativan as needed for her severe esophageal spasms. She also apparently gets rectal spasms for which she uses diltiazem gel and is asking for a refill. Her primary concern at this visit today is to discuss getting a prescription for some Carafate. She tells me that she's been undergoing treatment at [facility] with BCG for her bladder cancer. When she gets each treatments she gets severe reflux and was getting violent hiccups as well. For her last treatment they gave her a dose of baclofen and Carafate suspension. She really felt like the Carafate suspension helped her symptoms and would like to have some at home to use prn.  Just of note, she has several anxiety/sleep medications and GI medications at home that she has been prescribed by different physicians over the years and just uses them sporadically, but they remain on her medication list.   Current Medications, Allergies, Past Medical History, Past Surgical History, Family History and Social History were reviewed in [employer] record.   Physical Exam: BP 106/60 mmHg  Pulse 64  Ht 5' 2.5" (1.588 m)  Wt 129 lb 8 oz (58.741 kg)  BMI 23.29 kg/m2 General: Well developed white female in no acute distress Head: Normocephalic and atraumatic Eyes:  Sclerae anicteric, conjunctiva pink  Ears: Normal auditory acuity Lungs: Clear throughout to auscultation Heart: Regular rate and rhythm Abdomen: Soft, non-distended.  Normal bowel sounds.  Mild suprapubic TTP without R/R/G. Musculoskeletal: Symmetrical with no gross deformities  Extremities: No edema    Neurological: Alert oriented x 4, grossly non-focal Psychological:  Alert and cooperative. Normal mood and affect  Assessment and Recommendations: -GERD:  Continue Nexium 40 mg once or twice daily (usually takes once a day, but occasionally has flares of her GERD for which she increases the dose to twice daily for a while).  Will also had Carafate suspension four times daily prn for now (says that it helps with the reflux symptoms that she gets with her bladder cancer treatments).  -Rectal spasms:  Uses diltiazem gel prn.  Asking for a refill, which we will provide.

## 2015-04-13 NOTE — Progress Notes (Signed)
Reviewed and agree with documentation and assessment and plan. K. Veena Nandigam , MD   

## 2015-04-15 ENCOUNTER — Emergency Department (HOSPITAL_COMMUNITY)
Admission: EM | Admit: 2015-04-15 | Discharge: 2015-04-16 | Disposition: A | Discharge status: Home / Self Care | Payer: BLUE CROSS/BLUE SHIELD | Attending: Emergency Medicine | Admitting: Emergency Medicine

## 2015-04-15 ENCOUNTER — Encounter (HOSPITAL_COMMUNITY): Payer: Self-pay | Admitting: Oncology

## 2015-04-15 ENCOUNTER — Emergency Department (HOSPITAL_COMMUNITY): Payer: BLUE CROSS/BLUE SHIELD

## 2015-04-15 DIAGNOSIS — Z87891 Personal history of nicotine dependence: Secondary | ICD-10-CM | POA: Diagnosis not present

## 2015-04-15 DIAGNOSIS — Z8673 Personal history of transient ischemic attack (TIA), and cerebral infarction without residual deficits: Secondary | ICD-10-CM | POA: Insufficient documentation

## 2015-04-15 DIAGNOSIS — K5900 Constipation, unspecified: Secondary | ICD-10-CM | POA: Insufficient documentation

## 2015-04-15 DIAGNOSIS — Z7982 Long term (current) use of aspirin: Secondary | ICD-10-CM | POA: Diagnosis not present

## 2015-04-15 DIAGNOSIS — R011 Cardiac murmur, unspecified: Secondary | ICD-10-CM | POA: Insufficient documentation

## 2015-04-15 DIAGNOSIS — K589 Irritable bowel syndrome without diarrhea: Secondary | ICD-10-CM | POA: Diagnosis not present

## 2015-04-15 DIAGNOSIS — E785 Hyperlipidemia, unspecified: Secondary | ICD-10-CM | POA: Diagnosis not present

## 2015-04-15 DIAGNOSIS — N390 Urinary tract infection, site not specified: Secondary | ICD-10-CM | POA: Insufficient documentation

## 2015-04-15 DIAGNOSIS — I10 Essential (primary) hypertension: Secondary | ICD-10-CM | POA: Diagnosis not present

## 2015-04-15 DIAGNOSIS — Z792 Long term (current) use of antibiotics: Secondary | ICD-10-CM | POA: Insufficient documentation

## 2015-04-15 DIAGNOSIS — R109 Unspecified abdominal pain: Secondary | ICD-10-CM | POA: Diagnosis present

## 2015-04-15 DIAGNOSIS — C679 Malignant neoplasm of bladder, unspecified: Secondary | ICD-10-CM | POA: Diagnosis not present

## 2015-04-15 DIAGNOSIS — K219 Gastro-esophageal reflux disease without esophagitis: Secondary | ICD-10-CM | POA: Insufficient documentation

## 2015-04-15 DIAGNOSIS — Z79899 Other long term (current) drug therapy: Secondary | ICD-10-CM | POA: Diagnosis not present

## 2015-04-15 DIAGNOSIS — F419 Anxiety disorder, unspecified: Secondary | ICD-10-CM | POA: Diagnosis not present

## 2015-04-15 DIAGNOSIS — N1330 Unspecified hydronephrosis: Secondary | ICD-10-CM | POA: Diagnosis not present

## 2015-04-15 DIAGNOSIS — Z9889 Other specified postprocedural states: Secondary | ICD-10-CM | POA: Diagnosis not present

## 2015-04-15 DIAGNOSIS — G8929 Other chronic pain: Secondary | ICD-10-CM | POA: Diagnosis not present

## 2015-04-15 HISTORY — DX: Malignant (primary) neoplasm, unspecified: C80.1

## 2015-04-15 LAB — COMPREHENSIVE METABOLIC PANEL
ALT: 33 U/L (ref 14–54)
AST: 33 U/L (ref 15–41)
Albumin: 4.5 g/dL (ref 3.5–5.0)
Alkaline Phosphatase: 132 U/L — ABNORMAL HIGH (ref 38–126)
Anion gap: 8 (ref 5–15)
BUN: 13 mg/dL (ref 6–20)
CO2: 27 mmol/L (ref 22–32)
Calcium: 9.2 mg/dL (ref 8.9–10.3)
Chloride: 103 mmol/L (ref 101–111)
Creatinine, Ser: 1.12 mg/dL — ABNORMAL HIGH (ref 0.44–1.00)
GFR calc Af Amer: 59 mL/min — ABNORMAL LOW (ref >60)
GFR calc non Af Amer: 51 mL/min — ABNORMAL LOW (ref >60)
Glucose, Bld: 105 mg/dL — ABNORMAL HIGH (ref 65–99)
Potassium: 3.7 mmol/L (ref 3.5–5.1)
Sodium: 138 mmol/L (ref 135–145)
Total Bilirubin: 0.4 mg/dL (ref 0.3–1.2)
Total Protein: 7.6 g/dL (ref 6.5–8.1)

## 2015-04-15 LAB — CBC
HCT: 35.7 % — ABNORMAL LOW (ref 36.0–46.0)
Hemoglobin: 11.4 g/dL — ABNORMAL LOW (ref 12.0–15.0)
MCH: 27.9 pg (ref 26.0–34.0)
MCHC: 31.9 g/dL (ref 30.0–36.0)
MCV: 87.3 fL (ref 78.0–100.0)
Platelets: 244 10*3/uL (ref 150–400)
RBC: 4.09 MIL/uL (ref 3.87–5.11)
RDW: 13.4 % (ref 11.5–15.5)
WBC: 7.4 10*3/uL (ref 4.0–10.5)

## 2015-04-15 LAB — URINE MICROSCOPIC-ADD ON

## 2015-04-15 LAB — URINALYSIS, ROUTINE W REFLEX MICROSCOPIC
Bilirubin Urine: NEGATIVE
Glucose, UA: NEGATIVE mg/dL
Ketones, ur: NEGATIVE mg/dL
Nitrite: POSITIVE — AB
Protein, ur: 30 mg/dL — AB
Specific Gravity, Urine: 1.004 — ABNORMAL LOW (ref 1.005–1.030)
Urobilinogen, UA: 1 mg/dL (ref 0.0–1.0)
pH: 6 (ref 5.0–8.0)

## 2015-04-15 LAB — LIPASE, BLOOD: Lipase: 29 U/L (ref 11–51)

## 2015-04-15 MED ORDER — PHENAZOPYRIDINE HCL 200 MG PO TABS: 200.0000 mg | ORAL_TABLET | Freq: Three times a day (TID) | ORAL | Status: DC | PRN

## 2015-04-15 MED ORDER — TRAMADOL HCL 50 MG PO TABS: 50.0000 mg | ORAL_TABLET | Freq: Four times a day (QID) | ORAL | Status: DC | PRN

## 2015-04-15 MED ORDER — DEXTROSE 5 % IV SOLN
1.0000 g | Freq: Once | INTRAVENOUS | Status: AC
  Administered 2015-04-15: 1 g via INTRAVENOUS
  Filled 2015-04-15: qty 10

## 2015-04-15 MED ORDER — SODIUM CHLORIDE 0.9 % IV BOLUS (SEPSIS)
500.0000 mL | Freq: Once | INTRAVENOUS | Status: AC
  Administered 2015-04-15: 500 mL via INTRAVENOUS

## 2015-04-15 MED ORDER — CIPROFLOXACIN HCL 500 MG PO TABS: 500.0000 mg | ORAL_TABLET | Freq: Two times a day (BID) | ORAL | Status: DC

## 2015-04-15 MED ORDER — POLYETHYLENE GLYCOL 3350 17 G PO PACK: 17.0000 g | PACK | Freq: Every day | ORAL | Status: DC

## 2015-04-15 MED ORDER — MORPHINE SULFATE (PF) 4 MG/ML IV SOLN
4.0000 mg | Freq: Once | INTRAVENOUS | Status: AC
  Administered 2015-04-15: 4 mg via INTRAVENOUS
  Filled 2015-04-15: qty 1

## 2015-04-15 MED ORDER — OXYBUTYNIN CHLORIDE 5 MG PO TABS: 5.0000 mg | ORAL_TABLET | Freq: Three times a day (TID) | ORAL | Status: DC | PRN

## 2015-04-15 MED ORDER — ONDANSETRON HCL 4 MG/2ML IJ SOLN
4.0000 mg | Freq: Once | INTRAMUSCULAR | Status: AC
  Administered 2015-04-15: 4 mg via INTRAVENOUS
  Filled 2015-04-15: qty 2

## 2015-04-15 MED ORDER — IOHEXOL 300 MG/ML  SOLN
100.0000 mL | Freq: Once | INTRAMUSCULAR | Status: AC | PRN
  Administered 2015-04-15: 100 mL via INTRAVENOUS

## 2015-04-15 NOTE — ED Notes (Signed)
Delay in lab draw, pt not in room 

## 2015-04-15 NOTE — ED Notes (Signed)
Pt presents d/t generalized abdominal pain.  Pt had the last of 6 BCG preformed at [facility] for bladder cancer.  Pt states pain moves in abdomen and her PCP suggested presenting for [place] to r/o kidney issues.

## 2015-04-15 NOTE — Discharge Instructions (Signed)
You have a urinary tract infection. Scan also shows constipation and enlargement of your right kidney called hydronephrosis. Strongly recommend follow-up with local urologist. Call Monday for follow-up. Refills of your other medication.

## 2015-04-15 NOTE — ED Notes (Signed)
Patient transported to CT 

## 2015-04-16 NOTE — ED Provider Notes (Signed)
CSN: [id number]     Arrival date & time 04/15/15  1917 History   First MD Initiated Contact with Patient 04/15/15 1942     Chief Complaint  Patient presents with  . Abdominal Pain     (Consider location/radiation/quality/duration/timing/severity/associated sxs/prior Treatment) HPI.....Marland Kitchen status post diagnosis of bladder cancer summer of 2016 with subsequent transurethral resection of the bladder tumor on 12/28/14 by Dr. Gaynelle Arabian locally. She is now being  treated at [facility] having received 6/6 Txs of BCG chemotherapy.  She complains of generalized abdominal pain and back pain. Pain is fleeting and seems to move to different areas. No dysuria, hematuria, fever, sweats, chills, vomiting, diarrhea. Severity is moderate.   Past Medical History  Diagnosis Date  . GERD (gastroesophageal reflux disease)   . IBS (irritable bowel syndrome)     Diarrhea predominent, esophageal spasm severe, GERD - Dr. Elicia Lamp  . Fatty liver 04/28/12  . Hyperlipidemia   . Essential hypertension, benign   . Interstitial cystitis     Dr. Gaynelle Arabian  . Memory loss     Psychiatry Dr. Stan Head, concerns for memory loss, anxiety  . Anxiety     Psychiatry Dr. Stan Head, concerns for memory loss, anxiety  . PONV (postoperative nausea and vomiting)     AND URINARY RETENTION  . Heart murmur   . History of esophageal spasm     takes ranexa  . Esophageal motility disorder   . Chronic chest pain   . History of TIA (transient ischemic attack)     2011--  no residuals  . Sensation of pressure in bladder area   . Wears glasses   . TMJ (temporomandibular joint syndrome)   . H/O esophageal spasm   . Bladder tumor   . Coronary atherosclerosis CARDIOLOGIST-  DR Daneen Schick    Cath 11/2006 demonstrating moderate obstructive coronary disease with 70-80% septal perforator #1, 50-70% first diagonal, 80% third diagonal, 50% mid and distal LAD & heavy calcification throughout all 3 coronaries. LVF normal.  .  Cancer ([place])     bladder CA   Past Surgical History  Procedure Laterality Date  . Esophagogastroduodenoscopy  04/30/2012    Procedure: ESOPHAGOGASTRODUODENOSCOPY (EGD);  Surgeon: Lafayette Dragon, MD;  Location: [facility];  Service: Endoscopy;  Laterality: N/A;  . Shoulder arthroscopy Left 12-23-2011  . Lumbar disc surgery  1973  . Appendectomy  1971    AND OVARIAN CYSTECTOMY  . Benign excisional breast bx  1996  . Cysto/  hydrodistention/  instillation therapy  03-12-2010  . Abdominal hysterectomy  AGE 21 -- 1989  . Transthoracic echocardiogram  04-21-2012    GRADE I DIASTOLIC DYSFUNCTION/  EF 60-65%/  MILD MR  &  TR  . Negative sleep study  2010  . Cardiac catheterization  11-28-2006   DR Daneen Schick     moderate cad/  70-80% septal perforator #1, 50-70% first diagonal, 80% third diagonal, 50% mid and distal LAD & heavy calcification throughout all 3 coronaries. LVF normal.  . Cardiac catheterization  03-26-2012   DR Daneen Schick    patent CFX,  RCA,  & pLAD/  mLAD >50% to <70% a focal eccentric region that overlaps the third diagonal/  90% diagonal ostial and unchanged from prior study /   ef 65%  . Cysto with hydrodistension N/A 01/14/2014    Procedure: CYSTOSCOPY/HYDRODISTENSION/INSERTION OF MARCAINE AND PYRIDUIM INTO BLADDER/INJECTION OF MARCAINE AND KENALOG into sub trigone space ;  Surgeon: Ailene Rud, MD;  Location: [facility];  Service: Urology;  Laterality: N/A;  . Transurethral resection of bladder tumor Right 01/03/2015    Procedure: TRANSURETHRAL RESECTION OF BLADDER TUMOR (TURBT);  Surgeon: Carolan Clines, MD;  Location: [facility];  Service: Urology;  Laterality: Right;   Family History  Problem Relation Age of Onset  . Heart disease Mother   . Colon polyps Mother   . Alzheimer's disease Mother   . Dementia Mother   . CAD Mother   . Heart disease Father   . Diverticulosis Father   . CAD Father     in his 52s  . Heart  disease Brother   . Colon cancer Neg Hx   . Osteoarthritis Other     Multiple family members  . Obesity Other     Multiple family members  . Depression Other     Multiple family members   Social History  Substance Use Topics  . Smoking status: Former Smoker -- 10 years    Types: Cigarettes    Quit date: 06/10/1973  . Smokeless tobacco: Never Used  . Alcohol Use: Yes     Comment: social   OB History    No data available     Review of Systems  All other systems reviewed and are negative.     Allergies  Clindamycin/lincomycin; Doxycycline; Escitalopram oxalate; Hydrocodone; Levsin; Macrodantin; Trazodone and nefazodone; and Trimethoprim  Home Medications   Prior to Admission medications   Medication Sig Start Date End Date Taking? Authorizing Provider  acyclovir (ZOVIRAX) 400 MG tablet Take 400 mg by mouth 2 (two) times daily.   Yes Historical Provider, MD  aspirin 81 MG tablet Take 81 mg by mouth at bedtime.    Yes Historical Provider, MD  atorvastatin (LIPITOR) 20 MG tablet Take 20 mg by mouth at bedtime.    Yes Historical Provider, MD  calcium carbonate (TUMS - DOSED IN MG ELEMENTAL CALCIUM) 500 MG chewable tablet Chew 1 tablet by mouth as needed for indigestion or heartburn.   Yes Historical Provider, MD  Cholecalciferol (VITAMIN D) 1000 UNITS capsule Take 1,000 Units by mouth daily.   Yes Historical Provider, MD  clidinium-chlordiazePOXIDE (LIBRAX) 5-2.5 MG capsule Take 1 capsule by mouth 3 (three) times daily before meals.   Yes Historical Provider, MD  Coenzyme Q10 (COQ10) 100 MG CAPS Take 100 mg by mouth daily.    Yes Historical Provider, MD  diazepam (VALIUM) 2 MG tablet Take 2 mg by mouth 2 (two) times daily.   Yes Historical Provider, MD  dicyclomine (BENTYL) 10 MG capsule Take 1 capsule (10 mg total) by mouth 3 (three) times daily as needed. For IBS 05/16/14  Yes Jessica D Zehr, PA-C  diltiazem 2 % GEL Apply 1 application topically as needed. For rectal spasm  04/06/15  Yes Jessica D Zehr, PA-C  doxylamine, Sleep, (UNISOM) 25 MG tablet Take 37.5 mg by mouth at bedtime.    Yes Historical Provider, MD  esomeprazole (NEXIUM) 40 MG capsule Take 1 capsule twice daily. (patient prefers name brand Nexium only) Patient taking differently: Take 40 mg by mouth daily.  04/06/15  Yes Jessica D Zehr, PA-C  estradiol (CLIMARA - DOSED IN MG/24 HR) 0.025 mg/24hr patch Place 0.025 mg onto the skin once a week.   Yes Historical Provider, MD  fluticasone (FLONASE) 50 MCG/ACT nasal spray Place into both nostrils as needed for allergies or rhinitis.   Yes Historical Provider, MD  ibuprofen (ADVIL,MOTRIN) 200 MG tablet Take 600 mg by mouth  every 6 (six) hours as needed for headache or moderate pain.   Yes Historical Provider, MD  ketorolac (TORADOL) 10 MG tablet Take 10 mg by mouth every 6 (six) hours. 04/09/15  Yes Historical Provider, MD  loperamide (IMODIUM A-D) 2 MG tablet Take 2 mg by mouth as needed for diarrhea or loose stools.   Yes Historical Provider, MD  LORazepam (ATIVAN) 0.5 MG tablet Take three times a day as needed Patient taking differently: Take 0.5 mg by mouth daily as needed (spasms).  05/16/14  Yes Jessica D Zehr, PA-C  nitroGLYCERIN (NITROSTAT) 0.4 MG SL tablet place 1 tablet under the tongue if needed every 5 minutes for chest pain for 3 doses IF NO RELIEF AFTER 3RD DOSE CALL PRESCRIBER OR 911. 03/22/15  Yes Belva Crome, MD  ondansetron (ZOFRAN) 8 MG tablet Take 1 tablet (8 mg total) by mouth every 8 (eight) hours as needed for nausea or vomiting (1/2 tablet-1 tablet). 01/03/15  Yes Carolan Clines, MD  oxyCODONE (OXY IR/ROXICODONE) 5 MG immediate release tablet Take 1 tablet by mouth as needed for moderate pain.  01/26/15  Yes Historical Provider, MD  PREMARIN vaginal cream Place 1 application vaginally 2 (two) times a week. 04/05/15  Yes Historical Provider, MD  PRESCRIPTION MEDICATION BCG injection for bladder cancer given at [place]   Yes Historical  Provider, MD  Probiotic Product (PROBIOTIC ACIDOPHILUS) CAPS Take 1 capsule by mouth daily.   Yes Historical Provider, MD  ranolazine (RANEXA) 500 MG 12 hr tablet Take 1 tablet (500 mg total) by mouth 2 (two) times daily. 04/06/15  Yes Belva Crome, MD  sucralfate (CARAFATE) 1 GM/10ML suspension Take 10 mLs (1 g total) by mouth 4 (four) times daily -  with meals and at bedtime. 04/06/15  Yes Jessica D Zehr, PA-C  temazepam (RESTORIL) 15 MG capsule Take 1 capsule by mouth once a week. Does not take temazepam on a specific day 02/11/12  Yes Historical Provider, MD  tolterodine (DETROL LA) 4 MG 24 hr capsule Take 1 capsule by mouth daily. 03/15/15  Yes Historical Provider, MD  traMADol-acetaminophen (ULTRACET) 37.5-325 MG per tablet Take 1 tablet by mouth every 6 (six) hours as needed. 01/03/15  Yes Carolan Clines, MD  trimethoprim (TRIMPEX) 100 MG tablet Take 1 tablet by mouth daily. 02/20/15  Yes Historical Provider, MD  ciprofloxacin (CIPRO) 500 MG tablet Take 1 tablet (500 mg total) by mouth 2 (two) times daily. 04/15/15   Nat Christen, MD  oxybutynin (DITROPAN) 5 MG tablet Take 1 tablet (5 mg total) by mouth 3 (three) times daily as needed for bladder spasms. 04/15/15   Nat Christen, MD  phenazopyridine (PYRIDIUM) 200 MG tablet Take 1 tablet (200 mg total) by mouth 3 (three) times daily as needed for pain. 04/15/15   Nat Christen, MD  polyethylene glycol [facility] / Floria Raveling) packet Take 17 g by mouth daily. 04/15/15   Nat Christen, MD  traMADol (ULTRAM) 50 MG tablet Take 1 tablet (50 mg total) by mouth every 6 (six) hours as needed. 04/15/15   Nat Christen, MD   BP 103/48 mmHg  Pulse 76  Temp(Src) 98.3 F (36.8 C) (Oral)  Resp 18  Ht 5\' 2"  (1.575 m)  Wt 128 lb (58.06 kg)  BMI 23.41 kg/m2  SpO2 95% Physical Exam  Constitutional: She is oriented to person, place, and time. She appears well-developed and well-nourished.  HENT:  Head: Normocephalic and atraumatic.  Eyes: Conjunctivae and EOM are normal.  Pupils are equal, round,  and reactive to light.  Neck: Normal range of motion. Neck supple.  Cardiovascular: Normal rate and regular rhythm.   Pulmonary/Chest: Effort normal and breath sounds normal.  Abdominal: Soft. Bowel sounds are normal.  Minimal lower abdominal tenderness.  Genitourinary:  Minimal right flank tenderness.  Musculoskeletal: Normal range of motion.  Neurological: She is alert and oriented to person, place, and time.  Skin: Skin is warm and dry.  Psychiatric: She has a normal mood and affect. Her behavior is normal.  Nursing note and vitals reviewed.   ED Course  Procedures (including critical care time) Labs Review Labs Reviewed  COMPREHENSIVE METABOLIC PANEL - Abnormal; Notable for the following:    Glucose, Bld 105 (*)    Creatinine, Ser 1.12 (*)    Alkaline Phosphatase 132 (*)    GFR calc non Af Amer 51 (*)    GFR calc Af Amer 59 (*)    All other components within normal limits  CBC - Abnormal; Notable for the following:    Hemoglobin 11.4 (*)    HCT 35.7 (*)    All other components within normal limits  URINALYSIS, ROUTINE W REFLEX MICROSCOPIC (NOT AT [facility]) - Abnormal; Notable for the following:    Color, Urine ORANGE (*)    APPearance TURBID (*)    Specific Gravity, Urine 1.004 (*)    Hgb urine dipstick LARGE (*)    Protein, ur 30 (*)    Nitrite POSITIVE (*)    Leukocytes, UA LARGE (*)    All other components within normal limits  URINE MICROSCOPIC-ADD ON - Abnormal; Notable for the following:    Bacteria, UA MANY (*)    All other components within normal limits  URINE CULTURE  LIPASE, BLOOD    Imaging Review Ct Abdomen Pelvis W Contrast  04/15/2015  CLINICAL DATA:  Generalized abdominal pain. History of bladder cancer. EXAM: CT ABDOMEN AND PELVIS WITH CONTRAST TECHNIQUE: Multidetector CT imaging of the abdomen and pelvis was performed using the standard protocol following bolus administration of intravenous contrast. CONTRAST:  146mL OMNIPAQUE  IOHEXOL 300 MG/ML  SOLN COMPARISON:  CT scan 01/03/2014 FINDINGS: Lower chest: The lung bases are clear except for areas of linear scarring. No worrisome pulmonary nodules to suggest pulmonary metastatic disease. Mild halo appearance around the left lower lobe pulmonary vessels could suggest vasculitis. No pleural effusion. The heart is normal in size. No pericardial effusion. Coronary artery calcifications are noted. The distal esophagus is grossly normal. Hepatobiliary: No focal hepatic lesions to suggest metastatic disease. A tiny hepatic cyst is noted. No intrahepatic biliary dilatation. The gallbladder is normal no common bile duct dilatation. Pancreas: No mass, inflammation or ductal dilatation. Spleen: Normal size.  No focal lesions. Adrenals/Urinary Tract: The adrenal glands are normal. There is moderate right-sided hydroureteronephrosis all the way down to the bladder. No obvious obstructing mass or calculus. This is of unknown chronicity. It was not present back in 2015. Delayed images through the bladder do not demonstrate any bladder mass or asymmetric wall thickening. A left ureteral jet is noted. Stomach/Bowel: The stomach, duodenum, small bowel and colon are unremarkable. No inflammatory changes, mass lesions or obstructive findings. There is a large amount of stool in the colon suggesting constipation. Vascular/Lymphatic: No mesenteric or retroperitoneal mass or adenopathy. Moderate atherosclerotic calcifications involving the aorta. The branch vessels are patent. The major venous structures are patent. Other: No pelvic mass or adenopathy. No free pelvic fluid collections. The uterus is surgically absent. Both ovaries are still present and  appear normal. No inguinal mass or adenopathy. Musculoskeletal: No significant bony findings. IMPRESSION: 1. Moderate right-sided hydroureteronephrosis of uncertain etiology or chronicity. No obstructing ureteral calculus or obvious mass. Possible distal ureteral  stricture. Mild distal ureteral enhancement. Recommend urology consultation. 2. No bladder mass or asymmetric wall thickening is demonstrated. The left kidney and ureter are normal. 3. No CT findings worrisome for metastatic disease. 4. Large amount of stool throughout the colon may suggest constipation. Electronically Signed   By: Marijo Sanes M.D.   On: 04/15/2015 22:35   I have personally reviewed and evaluated these images and lab results as part of my medical decision-making.   EKG Interpretation None      MDM   Final diagnoses:  UTI (lower urinary tract infection)  Hydronephrosis, right  Constipation, unspecified constipation type  Malignant neoplasm of urinary bladder, unspecified site (HCC)    No acute abdomen. Patient was treated for a urinary tract infection. Results of CT scan were discussed with the urologist on-call, the patient, and her husband. She will follow-up locally with [facility].   Discharge medications Cipro 500 mg, Tramadol 50 mg, Miralax, Ditropan, Pyridium.    Nat Christen, MD 04/16/15 [phone]

## 2015-04-18 LAB — URINE CULTURE: Culture: >=100000

## 2015-04-19 ENCOUNTER — Telehealth (HOSPITAL_BASED_OUTPATIENT_CLINIC_OR_DEPARTMENT_OTHER): Payer: Self-pay | Admitting: Emergency Medicine

## 2015-04-19 NOTE — Telephone Encounter (Signed)
Post ED Visit - Positive Culture Follow-up  Culture report reviewed by antimicrobial stewardship pharmacist:  []  Elenor Quinones, Pharm.D. []  Heide Guile, Pharm.D., BCPS []  Parks Neptune, Pharm.D. []  Alycia Rossetti, Pharm.D., BCPS []  [place], Pharm.D., BCPS, AAHIVP []  Legrand Como, Pharm.D., BCPS, AAHIVP []  Milus Glazier, Pharm.D. [x]  Stephens November, [place].D.  Positive urine culture Klebsiella Treated with ciprofloxacin, organism sensitive to the same and no further patient follow-up is required at this time.  Hazle Nordmann 04/19/2015, 11:13 AM

## 2015-06-02 ENCOUNTER — Telehealth: Payer: Self-pay

## 2015-06-02 NOTE — Telephone Encounter (Signed)
Received fax requesting lorazepam tablet refill.  Will route to Alonza Bogus PA-C for approval/denial.

## 2015-06-02 NOTE — Telephone Encounter (Signed)
I know that Dr. Olevia Perches prescribed this for her for years, but please see if her PCP will refill this.  She needs a follow-up appt with Dr. Silverio Decamp scheduled as well.  Dr. Silverio Decamp and all of the other physicians here will not continue to give prescriptions like these.  Thank you,  Jess

## 2015-06-02 NOTE — Telephone Encounter (Signed)
Informed Husband that this refill needs to come from PCP, confirmed that is [name].  I will fax the request there.  [name] [employer].  He will tell her to call to make an appointment with [name] Silverio Decamp.

## 2015-06-09 ENCOUNTER — Telehealth: Payer: Self-pay | Admitting: Gastroenterology

## 2015-06-09 ENCOUNTER — Telehealth: Payer: Self-pay | Admitting: *Deleted

## 2015-06-09 MED ORDER — ESOMEPRAZOLE MAGNESIUM 40 MG PO PACK: 40.0000 mg | PACK | Freq: Every day | ORAL | Status: DC

## 2015-06-09 NOTE — Telephone Encounter (Signed)
Spoke to patient. She has been having to pay over $100.00 for the Nexium 40 mg, using her [employer] advantage at [place]. .  I spoke to the [facility] representative and I was told if she uses [facility], a pharmacy she has not used before, they will run the [facility] numbers on the text [facility] sent her. I gave them the patient's cell number.  I told the patient to look for the text and they did send the text to the patient. The patient got the text.  She asked me to send the prescription to [facility], .  I did send the Generic  Esomeprazole magnesium (Nexium ) 40 mg. The [facility] rep told me to have the patient tell the pharmacy clerk at [facility] she wants to use the [facility] ( [facility] numbers only).  I was told the price should be around $58.14.  I apologized to her ahead of time incase this doesn't work and they run her insurance. [address] does not have her insurance information on file. I told her to call me and let me know if she is able to get the medication at [facility].

## 2015-06-09 NOTE — Telephone Encounter (Signed)
The patient thought she had an appointment with Dr. Silverio Decamp but she did not. I made her one for 08-07-2015 at 2:45 PM.

## 2015-06-11 HISTORY — PX: TRIGGER FINGER RELEASE: SHX641

## 2015-07-17 ENCOUNTER — Other Ambulatory Visit: Payer: Self-pay | Admitting: *Deleted

## 2015-07-19 ENCOUNTER — Emergency Department (HOSPITAL_BASED_OUTPATIENT_CLINIC_OR_DEPARTMENT_OTHER)
Admission: EM | Admit: 2015-07-19 | Discharge: 2015-07-20 | Disposition: A | Discharge status: Home / Self Care | Payer: BLUE CROSS/BLUE SHIELD | Attending: Emergency Medicine | Admitting: Emergency Medicine

## 2015-07-19 ENCOUNTER — Encounter (HOSPITAL_BASED_OUTPATIENT_CLINIC_OR_DEPARTMENT_OTHER): Payer: Self-pay

## 2015-07-19 DIAGNOSIS — R011 Cardiac murmur, unspecified: Secondary | ICD-10-CM | POA: Insufficient documentation

## 2015-07-19 DIAGNOSIS — Z87891 Personal history of nicotine dependence: Secondary | ICD-10-CM | POA: Insufficient documentation

## 2015-07-19 DIAGNOSIS — E785 Hyperlipidemia, unspecified: Secondary | ICD-10-CM | POA: Diagnosis not present

## 2015-07-19 DIAGNOSIS — Z792 Long term (current) use of antibiotics: Secondary | ICD-10-CM | POA: Diagnosis not present

## 2015-07-19 DIAGNOSIS — R300 Dysuria: Secondary | ICD-10-CM | POA: Diagnosis present

## 2015-07-19 DIAGNOSIS — Z79899 Other long term (current) drug therapy: Secondary | ICD-10-CM | POA: Diagnosis not present

## 2015-07-19 DIAGNOSIS — Z7982 Long term (current) use of aspirin: Secondary | ICD-10-CM | POA: Insufficient documentation

## 2015-07-19 DIAGNOSIS — K219 Gastro-esophageal reflux disease without esophagitis: Secondary | ICD-10-CM | POA: Insufficient documentation

## 2015-07-19 DIAGNOSIS — Z973 Presence of spectacles and contact lenses: Secondary | ICD-10-CM | POA: Diagnosis not present

## 2015-07-19 DIAGNOSIS — N390 Urinary tract infection, site not specified: Secondary | ICD-10-CM | POA: Diagnosis not present

## 2015-07-19 DIAGNOSIS — G8929 Other chronic pain: Secondary | ICD-10-CM | POA: Diagnosis not present

## 2015-07-19 DIAGNOSIS — Z9889 Other specified postprocedural states: Secondary | ICD-10-CM | POA: Insufficient documentation

## 2015-07-19 DIAGNOSIS — I2510 Atherosclerotic heart disease of native coronary artery without angina pectoris: Secondary | ICD-10-CM | POA: Insufficient documentation

## 2015-07-19 DIAGNOSIS — Z8551 Personal history of malignant neoplasm of bladder: Secondary | ICD-10-CM | POA: Insufficient documentation

## 2015-07-19 DIAGNOSIS — I10 Essential (primary) hypertension: Secondary | ICD-10-CM | POA: Insufficient documentation

## 2015-07-19 DIAGNOSIS — Z8673 Personal history of transient ischemic attack (TIA), and cerebral infarction without residual deficits: Secondary | ICD-10-CM | POA: Insufficient documentation

## 2015-07-19 DIAGNOSIS — Z87448 Personal history of other diseases of urinary system: Secondary | ICD-10-CM | POA: Insufficient documentation

## 2015-07-19 DIAGNOSIS — K589 Irritable bowel syndrome without diarrhea: Secondary | ICD-10-CM | POA: Insufficient documentation

## 2015-07-19 DIAGNOSIS — Z7951 Long term (current) use of inhaled steroids: Secondary | ICD-10-CM | POA: Diagnosis not present

## 2015-07-19 DIAGNOSIS — F419 Anxiety disorder, unspecified: Secondary | ICD-10-CM | POA: Diagnosis not present

## 2015-07-19 LAB — URINALYSIS, ROUTINE W REFLEX MICROSCOPIC
Bilirubin Urine: NEGATIVE
Glucose, UA: NEGATIVE mg/dL
Ketones, ur: NEGATIVE mg/dL
Nitrite: POSITIVE — AB
Protein, ur: >300 mg/dL — AB
Specific Gravity, Urine: 1.015 (ref 1.005–1.030)
pH: 6.5 (ref 5.0–8.0)

## 2015-07-19 LAB — URINE MICROSCOPIC-ADD ON

## 2015-07-19 MED ORDER — METOCLOPRAMIDE HCL 5 MG/ML IJ SOLN
10.0000 mg | Freq: Once | INTRAMUSCULAR | Status: AC
  Administered 2015-07-20: 10 mg via INTRAVENOUS
  Filled 2015-07-19: qty 2

## 2015-07-19 MED ORDER — OXYCODONE-ACETAMINOPHEN 5-325 MG PO TABS
ORAL_TABLET | ORAL | Status: AC
  Administered 2015-07-19: 2 via ORAL
  Filled 2015-07-19: qty 2

## 2015-07-19 MED ORDER — FENTANYL CITRATE (PF) 100 MCG/2ML IJ SOLN
100.0000 ug | Freq: Once | INTRAMUSCULAR | Status: AC
  Administered 2015-07-20: 100 ug via INTRAVENOUS
  Filled 2015-07-19: qty 2

## 2015-07-19 MED ORDER — DEXTROSE 5 % IV SOLN
1.0000 g | Freq: Once | INTRAVENOUS | Status: AC
  Administered 2015-07-20: 1 g via INTRAVENOUS
  Filled 2015-07-19: qty 10

## 2015-07-19 MED ORDER — OXYCODONE-ACETAMINOPHEN 5-325 MG PO TABS
2.0000 | ORAL_TABLET | Freq: Once | ORAL | Status: AC
  Administered 2015-07-19: 2 via ORAL

## 2015-07-19 NOTE — ED Provider Notes (Signed)
CSN: [id number]     Arrival date & time 07/19/15  2057 History  By signing my name below, I, Terrance Branch, attest that this documentation has been prepared under the direction and in the presence of Shanon Rosser, MD. Electronically Signed: Randa Evens, ED Scribe. 07/19/2015. 11:34 PM.    Chief Complaint  Patient presents with  . Dysuria    Patient is a 65 y.o. female presenting with dysuria. The history is provided by the patient. No language interpreter was used.  Dysuria  HPI Comments: [name] is a 65 y.o. female with bladder cancer currently being treated who presents to the Emergency Department complaining of difficulty urinating onset today. Pt states that she had a treatment today which required retaining BCG for 2 hours. She had no sense of relief after voiding after the treatment. She contacted her urologist who recommended she be seen in the ED. An in and out cath was performed with partial relief. Pt is also complaining of right flank pain; she had a recent CT scan that showed right hydroureteronephrosis. Her urologist subsequently performed an unspecified procedure to open the right ureter. Pt reports nausea and vomiting after arrival to the ED. Pt denies fever or chills but was noted to have a low-grade fever on arrival.  Past Medical History  Diagnosis Date  . GERD (gastroesophageal reflux disease)   . IBS (irritable bowel syndrome)     Diarrhea predominent, esophageal spasm severe, GERD - Dr. Elicia Lamp  . Fatty liver 04/28/12  . Hyperlipidemia   . Essential hypertension, benign   . Interstitial cystitis     Dr. Gaynelle Arabian  . Memory loss     Psychiatry Dr. Stan Head, concerns for memory loss, anxiety  . Anxiety     Psychiatry Dr. Stan Head, concerns for memory loss, anxiety  . PONV (postoperative nausea and vomiting)     AND URINARY RETENTION  . Heart murmur   . History of esophageal spasm     takes ranexa  . Esophageal motility disorder   . Chronic  chest pain   . History of TIA (transient ischemic attack)     2011--  no residuals  . Sensation of pressure in bladder area   . Wears glasses   . TMJ (temporomandibular joint syndrome)   . H/O esophageal spasm   . Bladder tumor   . Coronary atherosclerosis CARDIOLOGIST-  DR Daneen Schick    Cath 11/2006 demonstrating moderate obstructive coronary disease with 70-80% septal perforator #1, 50-70% first diagonal, 80% third diagonal, 50% mid and distal LAD & heavy calcification throughout all 3 coronaries. LVF normal.  . Cancer ([place])     bladder CA   Past Surgical History  Procedure Laterality Date  . Esophagogastroduodenoscopy  04/30/2012    Procedure: ESOPHAGOGASTRODUODENOSCOPY (EGD);  Surgeon: Lafayette Dragon, MD;  Location: [facility];  Service: Endoscopy;  Laterality: N/A;  . Shoulder arthroscopy Left 12-23-2011  . Lumbar disc surgery  1973  . Appendectomy  1971    AND OVARIAN CYSTECTOMY  . Benign excisional breast bx  1996  . Cysto/  hydrodistention/  instillation therapy  03-12-2010  . Abdominal hysterectomy  AGE 55 -- 1989  . Transthoracic echocardiogram  04-21-2012    GRADE I DIASTOLIC DYSFUNCTION/  EF 60-65%/  MILD MR  &  TR  . Negative sleep study  2010  . Cardiac catheterization  11-28-2006   DR Daneen Schick     moderate cad/  70-80% septal perforator #1, 50-70% first diagonal,  80% third diagonal, 50% mid and distal LAD & heavy calcification throughout all 3 coronaries. LVF normal.  . Cardiac catheterization  03-26-2012   DR Daneen Schick    patent CFX,  RCA,  & pLAD/  mLAD >50% to <70% a focal eccentric region that overlaps the third diagonal/  90% diagonal ostial and unchanged from prior study /   ef 65%  . Cysto with hydrodistension N/A 01/14/2014    Procedure: CYSTOSCOPY/HYDRODISTENSION/INSERTION OF MARCAINE AND PYRIDUIM INTO BLADDER/INJECTION OF MARCAINE AND KENALOG into sub trigone space ;  Surgeon: Ailene Rud, MD;  Location: [facility];  Service:  Urology;  Laterality: N/A;  . Transurethral resection of bladder tumor Right 01/03/2015    Procedure: TRANSURETHRAL RESECTION OF BLADDER TUMOR (TURBT);  Surgeon: Carolan Clines, MD;  Location: [facility];  Service: Urology;  Laterality: Right;   Family History  Problem Relation Age of Onset  . Heart disease Mother   . Colon polyps Mother   . Alzheimer's disease Mother   . Dementia Mother   . CAD Mother   . Heart disease Father   . Diverticulosis Father   . CAD Father     in his 36s  . Heart disease Brother   . Colon cancer Neg Hx   . Osteoarthritis Other     Multiple family members  . Obesity Other     Multiple family members  . Depression Other     Multiple family members   Social History  Substance Use Topics  . Smoking status: Former Smoker -- 10 years    Types: Cigarettes    Quit date: 06/10/1973  . Smokeless tobacco: Never Used  . Alcohol Use: Yes     Comment: social   OB History    No data available     Review of Systems  Genitourinary: Positive for dysuria.  All other systems reviewed and are negative.  A complete 10 system review of systems was obtained and all systems are negative except as noted in the HPI and PMH.     Allergies  Clindamycin/lincomycin; Doxycycline; Escitalopram oxalate; Hydrocodone; Levsin; Macrodantin; Trazodone and nefazodone; and Trimethoprim  Home Medications   Prior to Admission medications   Medication Sig Start Date End Date Taking? Authorizing Provider  acyclovir (ZOVIRAX) 400 MG tablet Take 400 mg by mouth 2 (two) times daily.   Yes Historical Provider, MD  aspirin 81 MG tablet Take 81 mg by mouth at bedtime.    Yes Historical Provider, MD  atorvastatin (LIPITOR) 20 MG tablet Take 20 mg by mouth at bedtime.    Yes Historical Provider, MD  calcium carbonate (TUMS - DOSED IN MG ELEMENTAL CALCIUM) 500 MG chewable tablet Chew 1 tablet by mouth as needed for indigestion or heartburn.   Yes Historical Provider,  MD  Cholecalciferol (VITAMIN D) 1000 UNITS capsule Take 1,000 Units by mouth daily.   Yes Historical Provider, MD  clidinium-chlordiazePOXIDE (LIBRAX) 5-2.5 MG capsule Take 1 capsule by mouth 3 (three) times daily before meals.   Yes Historical Provider, MD  Coenzyme Q10 (COQ10) 100 MG CAPS Take 100 mg by mouth daily.    Yes Historical Provider, MD  diazepam (VALIUM) 2 MG tablet Take 2 mg by mouth 2 (two) times daily.   Yes Historical Provider, MD  dicyclomine (BENTYL) 10 MG capsule Take 1 capsule (10 mg total) by mouth 3 (three) times daily as needed. For IBS 05/16/14  Yes Jessica D Zehr, PA-C  diltiazem 2 % GEL Apply  1 application topically as needed. For rectal spasm 04/06/15  Yes Jessica D Zehr, PA-C  doxylamine, Sleep, (UNISOM) 25 MG tablet Take 37.5 mg by mouth at bedtime.    Yes Historical Provider, MD  esomeprazole (NEXIUM) 40 MG packet Take 40 mg by mouth daily before breakfast. 06/09/15  Yes Mauri Pole, MD  estradiol (CLIMARA - DOSED IN MG/24 HR) 0.025 mg/24hr patch Place 0.025 mg onto the skin once a week.   Yes Historical Provider, MD  fluticasone (FLONASE) 50 MCG/ACT nasal spray Place into both nostrils as needed for allergies or rhinitis.   Yes Historical Provider, MD  ibuprofen (ADVIL,MOTRIN) 200 MG tablet Take 600 mg by mouth every 6 (six) hours as needed for headache or moderate pain.   Yes Historical Provider, MD  ketorolac (TORADOL) 10 MG tablet Take 10 mg by mouth every 6 (six) hours. 04/09/15  Yes Historical Provider, MD  loperamide (IMODIUM A-D) 2 MG tablet Take 2 mg by mouth as needed for diarrhea or loose stools.   Yes Historical Provider, MD  LORazepam (ATIVAN) 0.5 MG tablet Take three times a day as needed Patient taking differently: Take 0.5 mg by mouth daily as needed (spasms).  05/16/14  Yes Jessica D Zehr, PA-C  nitroGLYCERIN (NITROSTAT) 0.4 MG SL tablet place 1 tablet under the tongue if needed every 5 minutes for chest pain for 3 doses IF NO RELIEF AFTER 3RD DOSE  CALL PRESCRIBER OR 911. 03/22/15  Yes Belva Crome, MD  ondansetron (ZOFRAN) 8 MG tablet Take 1 tablet (8 mg total) by mouth every 8 (eight) hours as needed for nausea or vomiting (1/2 tablet-1 tablet). 01/03/15  Yes Carolan Clines, MD  oxybutynin (DITROPAN) 5 MG tablet Take 1 tablet (5 mg total) by mouth 3 (three) times daily as needed for bladder spasms. 04/15/15  Yes Nat Christen, MD  oxyCODONE (OXY IR/ROXICODONE) 5 MG immediate release tablet Take 1 tablet by mouth as needed for moderate pain.  01/26/15  Yes Historical Provider, MD  phenazopyridine (PYRIDIUM) 200 MG tablet Take 1 tablet (200 mg total) by mouth 3 (three) times daily as needed for pain. 04/15/15  Yes Nat Christen, MD  polyethylene glycol [facility] / GLYCOLAX) packet Take 17 g by mouth daily. 04/15/15  Yes Nat Christen, MD  PREMARIN vaginal cream Place 1 application vaginally 2 (two) times a week. 04/05/15  Yes Historical Provider, MD  PRESCRIPTION MEDICATION BCG injection for bladder cancer given at [place]   Yes Historical Provider, MD  Probiotic Product (PROBIOTIC ACIDOPHILUS) CAPS Take 1 capsule by mouth daily.   Yes Historical Provider, MD  ranolazine (RANEXA) 500 MG 12 hr tablet Take 1 tablet (500 mg total) by mouth 2 (two) times daily. 04/06/15  Yes Belva Crome, MD  temazepam (RESTORIL) 15 MG capsule Take 1 capsule by mouth once a week. Does not take temazepam on a specific day 02/11/12  Yes Historical Provider, MD  tolterodine (DETROL LA) 4 MG 24 hr capsule Take 1 capsule by mouth daily. 03/15/15  Yes Historical Provider, MD  traMADol (ULTRAM) 50 MG tablet Take 1 tablet (50 mg total) by mouth every 6 (six) hours as needed. 04/15/15  Yes Nat Christen, MD  traMADol-acetaminophen (ULTRACET) 37.5-325 MG per tablet Take 1 tablet by mouth every 6 (six) hours as needed. 01/03/15  Yes Carolan Clines, MD  trimethoprim (TRIMPEX) 100 MG tablet Take 1 tablet by mouth daily. 02/20/15  Yes Historical Provider, MD  ciprofloxacin (CIPRO) 500 MG tablet  Take 1 tablet (500 mg  total) by mouth 2 (two) times daily. 04/15/15   Nat Christen, MD  esomeprazole (NEXIUM) 40 MG capsule Take 1 capsule twice daily. (patient prefers name brand Nexium only) Patient taking differently: Take 40 mg by mouth daily.  04/06/15   Jessica D Zehr, PA-C  sucralfate (CARAFATE) 1 GM/10ML suspension Take 10 mLs (1 g total) by mouth 4 (four) times daily -  with meals and at bedtime. Patient not taking: Reported on 07/19/2015 04/06/15   Janett Billow D Zehr, PA-C   BP 118/58 mmHg  Pulse 96  Temp(Src) 99.1 F (37.3 C) (Oral)  Resp 18  Ht 5\' 2"  (1.575 m)  Wt 130 lb (58.968 kg)  BMI 23.77 kg/m2  SpO2 93%   Physical Exam General: Well-developed, well-nourished female in no acute distress; appearance consistent with age of record HENT: normocephalic; atraumatic Eyes: pupils equal, round and reactive to light; extraocular muscles intact Neck: supple Heart: regular rate and rhythm Lungs: clear to auscultation bilaterally Abdomen: soft; nondistended; right suprapubic tenderness; no masses or hepatosplenomegaly; bowel sounds present GU: right CVA tenderness Extremities: No deformity; full range of motion; pulses normal Neurologic: Awake, alert and oriented; motor function intact in all extremities and symmetric; no facial droop Skin: Warm and dry Psychiatric: Normal mood and affect  ED Course  Procedures (including critical care time) DIAGNOSTIC STUDIES: Oxygen Saturation is 96% on RA, normal by my interpretation.    COORDINATION OF CARE: 11:32 PM-Discussed treatment plan with pt at bedside and pt agreed to plan.    MDM   Nursing notes and vitals signs, including pulse oximetry, reviewed.  Summary of this visit's results, reviewed by myself:  Labs:  Results for orders placed or performed during the hospital encounter of 07/19/15 (from the past 24 hour(s))  Urinalysis, Routine w reflex microscopic (not at [facility])     Status: Abnormal   Collection Time: 07/19/15 10:22 PM   Result Value Ref Range   Color, Urine ORANGE (A) YELLOW   APPearance TURBID (A) CLEAR   Specific Gravity, Urine 1.015 1.005 - 1.030   pH 6.5 5.0 - 8.0   Glucose, UA NEGATIVE NEGATIVE mg/dL   Hgb urine dipstick LARGE (A) NEGATIVE   Bilirubin Urine NEGATIVE NEGATIVE   Ketones, ur NEGATIVE NEGATIVE mg/dL   Protein, ur >300 (A) NEGATIVE mg/dL   Nitrite POSITIVE (A) NEGATIVE   Leukocytes, UA LARGE (A) NEGATIVE  Urine microscopic-add on     Status: Abnormal   Collection Time: 07/19/15 10:22 PM  Result Value Ref Range   Squamous Epithelial / LPF 0-5 (A) NONE SEEN   WBC, UA TOO NUMEROUS TO COUNT 0 - 5 WBC/hpf   RBC / HPF TOO NUMEROUS TO COUNT 0 - 5 RBC/hpf   Bacteria, UA FEW (A) NONE SEEN   1:39 AM Rocephin IV 1 gram given for UTI. We will treat for possible early pyelonephritis given right flank pain and tenderness.  Final diagnoses:  UTI (lower urinary tract infection)   I personally performed the services described in this documentation, which was scribed in my presence. The recorded information has been reviewed and is accurate.      Shanon Rosser, MD 07/20/15 0140

## 2015-07-19 NOTE — ED Notes (Addendum)
Bladder scan performed 234 to 287 ml found [name] continues to state she is unable to void and is requesting straight  Cath to assist with voiding

## 2015-07-19 NOTE — ED Notes (Signed)
Pt has bladder cancer and is on a number of medications and her urine output has slowed down today.  She states she called her doctor, Dr. Jennye Boroughs, recommended she come in for an in and out catheter to empty her bladder.  Pt is able to pee small amounts at a time, last time was just prior to triage.

## 2015-07-20 MED ORDER — CIPROFLOXACIN HCL 500 MG PO TABS: 500.0000 mg | ORAL_TABLET | Freq: Two times a day (BID) | ORAL | Status: DC

## 2015-07-20 MED ORDER — SODIUM CHLORIDE 0.9 % IV SOLN: Freq: Once | INTRAVENOUS | Status: DC

## 2015-07-23 LAB — URINE CULTURE: Culture: >=100000

## 2015-07-24 ENCOUNTER — Telehealth (HOSPITAL_COMMUNITY): Payer: Self-pay

## 2015-07-24 NOTE — Telephone Encounter (Signed)
Post ED Visit - Positive Culture Follow-up  Culture report reviewed by antimicrobial stewardship pharmacist:  []  Elenor Quinones, Pharm.D. []  Heide Guile, Pharm.D., BCPS []  Parks Neptune, Pharm.D. [x]  Alycia Rossetti, Pharm.D., BCPS []  [place], Pharm.D., BCPS, AAHIVP []  Legrand Como, Pharm.D., BCPS, AAHIVP []  Milus Glazier, Pharm.D. []  Stephens November, Pharm.D.  Positive urine culture, >/= 100,000 colonies -> Klebsiella Pneumoniae Treated with Ciprofloxacin, organism sensitive to the same and no further patient follow-up is required at this time.  Dortha Kern 07/24/2015, 8:38 PM

## 2015-08-05 ENCOUNTER — Emergency Department (HOSPITAL_COMMUNITY): Payer: BLUE CROSS/BLUE SHIELD

## 2015-08-05 ENCOUNTER — Encounter (HOSPITAL_COMMUNITY): Payer: Self-pay | Admitting: Emergency Medicine

## 2015-08-05 ENCOUNTER — Emergency Department (HOSPITAL_COMMUNITY)
Admission: EM | Admit: 2015-08-05 | Discharge: 2015-08-06 | Disposition: A | Discharge status: Other Acute Inpatient Hospital | Payer: BLUE CROSS/BLUE SHIELD | Attending: Emergency Medicine | Admitting: Emergency Medicine

## 2015-08-05 DIAGNOSIS — G8929 Other chronic pain: Secondary | ICD-10-CM | POA: Insufficient documentation

## 2015-08-05 DIAGNOSIS — R011 Cardiac murmur, unspecified: Secondary | ICD-10-CM | POA: Diagnosis not present

## 2015-08-05 DIAGNOSIS — Z7982 Long term (current) use of aspirin: Secondary | ICD-10-CM | POA: Diagnosis not present

## 2015-08-05 DIAGNOSIS — Z8551 Personal history of malignant neoplasm of bladder: Secondary | ICD-10-CM | POA: Insufficient documentation

## 2015-08-05 DIAGNOSIS — F419 Anxiety disorder, unspecified: Secondary | ICD-10-CM | POA: Insufficient documentation

## 2015-08-05 DIAGNOSIS — Z7951 Long term (current) use of inhaled steroids: Secondary | ICD-10-CM | POA: Insufficient documentation

## 2015-08-05 DIAGNOSIS — Z87448 Personal history of other diseases of urinary system: Secondary | ICD-10-CM | POA: Insufficient documentation

## 2015-08-05 DIAGNOSIS — Z9889 Other specified postprocedural states: Secondary | ICD-10-CM | POA: Insufficient documentation

## 2015-08-05 DIAGNOSIS — K219 Gastro-esophageal reflux disease without esophagitis: Secondary | ICD-10-CM | POA: Insufficient documentation

## 2015-08-05 DIAGNOSIS — I2510 Atherosclerotic heart disease of native coronary artery without angina pectoris: Secondary | ICD-10-CM | POA: Insufficient documentation

## 2015-08-05 DIAGNOSIS — Z87891 Personal history of nicotine dependence: Secondary | ICD-10-CM | POA: Insufficient documentation

## 2015-08-05 DIAGNOSIS — Z86018 Personal history of other benign neoplasm: Secondary | ICD-10-CM | POA: Insufficient documentation

## 2015-08-05 DIAGNOSIS — N390 Urinary tract infection, site not specified: Secondary | ICD-10-CM | POA: Diagnosis not present

## 2015-08-05 DIAGNOSIS — E785 Hyperlipidemia, unspecified: Secondary | ICD-10-CM | POA: Diagnosis not present

## 2015-08-05 DIAGNOSIS — Z79899 Other long term (current) drug therapy: Secondary | ICD-10-CM | POA: Insufficient documentation

## 2015-08-05 DIAGNOSIS — I10 Essential (primary) hypertension: Secondary | ICD-10-CM | POA: Insufficient documentation

## 2015-08-05 DIAGNOSIS — R509 Fever, unspecified: Secondary | ICD-10-CM | POA: Diagnosis present

## 2015-08-05 LAB — URINALYSIS, ROUTINE W REFLEX MICROSCOPIC
Bilirubin Urine: NEGATIVE
Glucose, UA: NEGATIVE mg/dL
Hgb urine dipstick: NEGATIVE
Ketones, ur: NEGATIVE mg/dL
Nitrite: NEGATIVE
Protein, ur: NEGATIVE mg/dL
Specific Gravity, Urine: 1.009 (ref 1.005–1.030)
pH: 7 (ref 5.0–8.0)

## 2015-08-05 LAB — COMPREHENSIVE METABOLIC PANEL
ALT: 50 U/L (ref 14–54)
AST: 54 U/L — ABNORMAL HIGH (ref 15–41)
Albumin: 3.6 g/dL (ref 3.5–5.0)
Alkaline Phosphatase: 219 U/L — ABNORMAL HIGH (ref 38–126)
Anion gap: 10 (ref 5–15)
BUN: 12 mg/dL (ref 6–20)
CO2: 23 mmol/L (ref 22–32)
Calcium: 8.6 mg/dL — ABNORMAL LOW (ref 8.9–10.3)
Chloride: 101 mmol/L (ref 101–111)
Creatinine, Ser: 0.82 mg/dL (ref 0.44–1.00)
GFR calc Af Amer: >60 mL/min (ref >60)
GFR calc non Af Amer: >60 mL/min (ref >60)
Glucose, Bld: 109 mg/dL — ABNORMAL HIGH (ref 65–99)
Potassium: 4.1 mmol/L (ref 3.5–5.1)
Sodium: 134 mmol/L — ABNORMAL LOW (ref 135–145)
Total Bilirubin: 0.6 mg/dL (ref 0.3–1.2)
Total Protein: 7 g/dL (ref 6.5–8.1)

## 2015-08-05 LAB — URINE MICROSCOPIC-ADD ON: Squamous Epithelial / LPF: NONE SEEN

## 2015-08-05 LAB — CBC WITH DIFFERENTIAL/PLATELET
Basophils Absolute: 0 10*3/uL (ref 0.0–0.1)
Basophils Relative: 0 %
Eosinophils Absolute: 0 10*3/uL (ref 0.0–0.7)
Eosinophils Relative: 0 %
HCT: 32.4 % — ABNORMAL LOW (ref 36.0–46.0)
Hemoglobin: 10.6 g/dL — ABNORMAL LOW (ref 12.0–15.0)
Lymphocytes Relative: 5 %
Lymphs Abs: 0.5 10*3/uL — ABNORMAL LOW (ref 0.7–4.0)
MCH: 28.4 pg (ref 26.0–34.0)
MCHC: 32.7 g/dL (ref 30.0–36.0)
MCV: 86.9 fL (ref 78.0–100.0)
Monocytes Absolute: 1 10*3/uL (ref 0.1–1.0)
Monocytes Relative: 10 %
Neutro Abs: 8.4 10*3/uL — ABNORMAL HIGH (ref 1.7–7.7)
Neutrophils Relative %: 85 %
Platelets: 268 10*3/uL (ref 150–400)
RBC: 3.73 MIL/uL — ABNORMAL LOW (ref 3.87–5.11)
RDW: 13.5 % (ref 11.5–15.5)
WBC: 9.9 10*3/uL (ref 4.0–10.5)

## 2015-08-05 LAB — I-STAT CG4 LACTIC ACID, ED: Lactic Acid, Venous: 0.9 mmol/L (ref 0.5–2.0)

## 2015-08-05 MED ORDER — CEFTRIAXONE SODIUM 1 G IJ SOLR
1.0000 g | INTRAMUSCULAR | Status: DC
  Administered 2015-08-05: 1 g via INTRAVENOUS
  Filled 2015-08-05: qty 10

## 2015-08-05 MED ORDER — ACETAMINOPHEN 325 MG PO TABS
650.0000 mg | ORAL_TABLET | Freq: Once | ORAL | Status: AC | PRN
  Administered 2015-08-05: 650 mg via ORAL
  Filled 2015-08-05: qty 2

## 2015-08-05 MED ORDER — MORPHINE SULFATE (PF) 2 MG/ML IV SOLN
2.0000 mg | Freq: Once | INTRAVENOUS | Status: AC
  Administered 2015-08-05: 2 mg via INTRAVENOUS
  Filled 2015-08-05: qty 1

## 2015-08-05 MED ORDER — ONDANSETRON HCL 4 MG/2ML IJ SOLN
4.0000 mg | Freq: Once | INTRAMUSCULAR | Status: AC
  Administered 2015-08-05: 4 mg via INTRAVENOUS
  Filled 2015-08-05: qty 2

## 2015-08-05 NOTE — ED Notes (Signed)
Bed: WA09 Expected date:  Expected time:  Means of arrival:  Comments: TR 5 when clean

## 2015-08-05 NOTE — ED Notes (Addendum)
Pt being treated for bladder ca. Pt reports she has been having R flank and back pain for some time. Has had nausea as well. Has had a fever at home and upon arrival. Has not had any antipyretics in the past 6 hours. Last chemo 2/8

## 2015-08-05 NOTE — ED Provider Notes (Signed)
CSN: [id number]     Arrival date & time 08/05/15  1719 History   First MD Initiated Contact with Patient 08/05/15 1837     Chief Complaint  Patient presents with  . Fever  . Flank Pain     (Consider location/radiation/quality/duration/timing/severity/associated sxs/prior Treatment) HPI Comments: Patient with a history of bladder cancer presents with one-day history of fever and right-sided flank pain. Symptoms began suddenly at rest and are not associated with dysuria or hematuria. History of UTIs in the past. 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 Denies any cough or congestion. No vomiting or diarrhea. Does note some nausea. Last chemotherapy treatment was over 2 weeks ago. No other treatments used prior to arrival  Patient is a 65 y.o. female presenting with fever and flank pain. The history is provided by the patient.  Fever Flank Pain    Past Medical History  Diagnosis Date  . GERD (gastroesophageal reflux disease)   . IBS (irritable bowel syndrome)     Diarrhea predominent, esophageal spasm severe, GERD - Dr. Elicia Lamp  . Fatty liver 04/28/12  . Hyperlipidemia   . Essential hypertension, benign   . Interstitial cystitis     Dr. Gaynelle Arabian  . Memory loss     Psychiatry Dr. Stan Head, concerns for memory loss, anxiety  . Anxiety     Psychiatry Dr. Stan Head, concerns for memory loss, anxiety  . PONV (postoperative nausea and vomiting)     AND URINARY RETENTION  . Heart murmur   . History of esophageal spasm     takes ranexa  . Esophageal motility disorder   . Chronic chest pain   . History of TIA (transient ischemic attack)     2011--  no residuals  . Sensation of pressure in bladder area   . Wears glasses   . TMJ (temporomandibular joint syndrome)   . H/O esophageal spasm   . Bladder tumor   . Coronary atherosclerosis CARDIOLOGIST-  DR Daneen Schick    Cath 11/2006 demonstrating moderate obstructive coronary disease with 70-80% septal perforator #1, 50-70% first diagonal, 80% third diagonal, 50% mid and distal LAD & heavy calcification throughout all 3 coronaries. LVF normal.  . Cancer ([place])     bladder CA   Past Surgical History  Procedure Laterality Date  . Esophagogastroduodenoscopy  04/30/2012    Procedure: ESOPHAGOGASTRODUODENOSCOPY (EGD);  Surgeon: Lafayette Dragon, MD;  Location: [facility];  Service: Endoscopy;  Laterality: N/A;  . Shoulder arthroscopy Left 12-23-2011  . Lumbar disc surgery  1973  . Appendectomy  1971    AND OVARIAN CYSTECTOMY  . Benign excisional breast bx  1996  . Cysto/  hydrodistention/  instillation therapy  03-12-2010  . Abdominal hysterectomy  AGE 49 -- 1989  . Transthoracic echocardiogram  04-21-2012    GRADE I DIASTOLIC DYSFUNCTION/  EF 60-65%/  MILD MR  &  TR  . Negative sleep study  2010  . Cardiac catheterization  11-28-2006   DR Daneen Schick     moderate cad/  70-80% septal perforator #1, 50-70% first diagonal, 80% third diagonal, 50% mid and distal LAD & heavy calcification throughout all 3 coronaries. LVF normal.  . Cardiac catheterization  03-26-2012   DR Daneen Schick    patent CFX,  RCA,  & pLAD/  mLAD >50% to <70% a focal eccentric region that overlaps the third diagonal/  90% diagonal ostial and unchanged from prior study /   ef 65%  . Cysto with hydrodistension N/A 01/14/2014    Procedure: CYSTOSCOPY/HYDRODISTENSION/INSERTION OF MARCAINE AND PYRIDUIM INTO BLADDER/INJECTION OF MARCAINE AND KENALOG into sub trigone space ;  Surgeon: Ailene Rud, MD;  Location: [facility];  Service: Urology;  Laterality: N/A;  . Transurethral resection of bladder tumor Right 01/03/2015    Procedure: TRANSURETHRAL RESECTION OF BLADDER TUMOR (TURBT);  Surgeon: Carolan Clines, MD;  Location: [facility];  Service: Urology;  Laterality: Right;   Family History  Problem  Relation Age of Onset  . Heart disease Mother   . Colon polyps Mother   . Alzheimer's disease Mother   . Dementia Mother   . CAD Mother   . Heart disease Father   . Diverticulosis Father   . CAD Father     in his 91s  . Heart disease Brother   . Colon cancer Neg Hx   . Osteoarthritis Other     Multiple family members  . Obesity Other     Multiple family members  . Depression Other     Multiple family members   Social History  Substance Use Topics  . Smoking status: Former Smoker -- 10 years    Types: Cigarettes    Quit date: 06/10/1973  . Smokeless tobacco: Never Used  . Alcohol Use: Yes     Comment: social   OB History    No data available     Review of Systems  Constitutional: Positive for fever.  Genitourinary: Positive for flank pain.  All other systems reviewed and are negative.     Allergies  Clindamycin/lincomycin; Doxycycline; Escitalopram oxalate; Hydrocodone; Levsin; Macrodantin; Trazodone and nefazodone; and Trimethoprim  Home Medications   Prior to Admission medications   Medication Sig Start Date End Date Taking? Authorizing Provider  acyclovir (ZOVIRAX) 400 MG tablet Take 400 mg by mouth 2 (two) times daily.    Historical Provider, MD  aspirin 81 MG tablet Take 81 mg by mouth at bedtime.     Historical Provider, MD  atorvastatin (LIPITOR) 20 MG tablet Take 20 mg by mouth at bedtime.     Historical Provider, MD  calcium carbonate (TUMS - DOSED IN MG ELEMENTAL CALCIUM) 500 MG chewable tablet Chew 1 tablet by mouth as needed for indigestion or heartburn.    Historical Provider, MD  Cholecalciferol (VITAMIN D) 1000 UNITS capsule Take 1,000 Units by mouth daily.    Historical Provider, MD  ciprofloxacin (CIPRO) 500 MG tablet Take 1 tablet (500 mg total) by mouth 2 (two) times daily. One po bid x 7 days 07/20/15   Shanon Rosser, MD  clidinium-chlordiazePOXIDE (LIBRAX) 5-2.5 MG capsule Take 1 capsule by mouth 3 (three) times daily before meals.    Historical  Provider, MD  Coenzyme Q10 (COQ10) 100 MG CAPS Take 100 mg by mouth daily.     Historical Provider, MD  diazepam (VALIUM) 2 MG tablet Take 2 mg by mouth 2 (two) times daily.    Historical Provider, MD  dicyclomine (BENTYL) 10 MG capsule Take 1 capsule (10 mg total) by mouth 3 (three) times daily as needed. For IBS 05/16/14   Jessica D Zehr, PA-C  diltiazem 2 % GEL Apply 1 application topically as needed. For rectal spasm 04/06/15   Laban Emperor Zehr, PA-C  doxylamine, Sleep, (UNISOM) 25 MG tablet Take 37.5 mg by mouth at bedtime.     Historical Provider, MD  esomeprazole (Marinette) 40  MG packet Take 40 mg by mouth daily before breakfast. 06/09/15   Mauri Pole, MD  estradiol (CLIMARA - DOSED IN MG/24 HR) 0.025 mg/24hr patch Place 0.025 mg onto the skin once a week.    Historical Provider, MD  fluticasone (FLONASE) 50 MCG/ACT nasal spray Place into both nostrils as needed for allergies or rhinitis.    Historical Provider, MD  ibuprofen (ADVIL,MOTRIN) 200 MG tablet Take 600 mg by mouth every 6 (six) hours as needed for headache or moderate pain.    Historical Provider, MD  ketorolac (TORADOL) 10 MG tablet Take 10 mg by mouth every 6 (six) hours. 04/09/15   Historical Provider, MD  loperamide (IMODIUM A-D) 2 MG tablet Take 2 mg by mouth as needed for diarrhea or loose stools.    Historical Provider, MD  LORazepam (ATIVAN) 0.5 MG tablet Take three times a day as needed Patient taking differently: Take 0.5 mg by mouth daily as needed (spasms).  05/16/14   Laban Emperor Zehr, PA-C  nitroGLYCERIN (NITROSTAT) 0.4 MG SL tablet place 1 tablet under the tongue if needed every 5 minutes for chest pain for 3 doses IF NO RELIEF AFTER 3RD DOSE CALL PRESCRIBER OR 911. 03/22/15   Belva Crome, MD  ondansetron (ZOFRAN) 8 MG tablet Take 1 tablet (8 mg total) by mouth every 8 (eight) hours as needed for nausea or vomiting (1/2 tablet-1 tablet). 01/03/15   Carolan Clines, MD  oxybutynin (DITROPAN) 5 MG tablet Take 1  tablet (5 mg total) by mouth 3 (three) times daily as needed for bladder spasms. 04/15/15   Nat Christen, MD  oxyCODONE (OXY IR/ROXICODONE) 5 MG immediate release tablet Take 1 tablet by mouth as needed for moderate pain.  01/26/15   Historical Provider, MD  phenazopyridine (PYRIDIUM) 200 MG tablet Take 1 tablet (200 mg total) by mouth 3 (three) times daily as needed for pain. 04/15/15   Nat Christen, MD  polyethylene glycol [facility] Floria Raveling) packet Take 17 g by mouth daily. 04/15/15   Nat Christen, MD  PREMARIN vaginal cream Place 1 application vaginally 2 (two) times a week. 04/05/15   Historical Provider, MD  PRESCRIPTION MEDICATION BCG injection for bladder cancer given at [place] Provider, MD  Probiotic Product (PROBIOTIC ACIDOPHILUS) CAPS Take 1 capsule by mouth daily.    Historical Provider, MD  ranolazine (RANEXA) 500 MG 12 hr tablet Take 1 tablet (500 mg total) by mouth 2 (two) times daily. 04/06/15   Belva Crome, MD  sucralfate (CARAFATE) 1 GM/10ML suspension Take 10 mLs (1 g total) by mouth 4 (four) times daily -  with meals and at bedtime. Patient not taking: Reported on 07/19/2015 04/06/15   Janett Billow D Zehr, PA-C  temazepam (RESTORIL) 15 MG capsule Take 1 capsule by mouth once a week. Does not take temazepam on a specific day 02/11/12   Historical Provider, MD  tolterodine (DETROL LA) 4 MG 24 hr capsule Take 1 capsule by mouth daily. 03/15/15   Historical Provider, MD  traMADol (ULTRAM) 50 MG tablet Take 1 tablet (50 mg total) by mouth every 6 (six) hours as needed. 04/15/15   Nat Christen, MD  traMADol-acetaminophen (ULTRACET) 37.5-325 MG per tablet Take 1 tablet by mouth every 6 (six) hours as needed. 01/03/15   Carolan Clines, MD  trimethoprim (TRIMPEX) 100 MG tablet Take 1 tablet by mouth daily. 02/20/15   Historical Provider, MD   BP 132/56 mmHg  Pulse 94  Temp(Src) 99.9 F (37.7 C) (  Oral)  Resp 18  Wt 58.06 kg  SpO2 98% Physical Exam  Constitutional: She is oriented to  person, place, and time. She appears well-developed and well-nourished.  Non-toxic appearance. No distress.  HENT:  Head: Normocephalic and atraumatic.  Eyes: Conjunctivae, EOM and lids are normal. Pupils are equal, round, and reactive to light.  Neck: Normal range of motion. Neck supple. No tracheal deviation present. No thyroid mass present.  Cardiovascular: Normal rate, regular rhythm and normal heart sounds.  Exam reveals no gallop.   No murmur heard. Pulmonary/Chest: Effort normal and breath sounds normal. No stridor. No respiratory distress. She has no decreased breath sounds. She has no wheezes. She has no rhonchi. She has no rales.  Abdominal: Soft. Normal appearance and bowel sounds are normal. She exhibits no distension. There is no tenderness. There is no rebound and no CVA tenderness.  Musculoskeletal: Normal range of motion. She exhibits no edema or tenderness.  Neurological: She is alert and oriented to person, place, and time. She has normal strength. No cranial nerve deficit or sensory deficit. GCS eye subscore is 4. GCS verbal subscore is 5. GCS motor subscore is 6.  Skin: Skin is warm and dry. No abrasion and no rash noted.  Psychiatric: She has a normal mood and affect. Her speech is normal and behavior is normal.  Nursing note and vitals reviewed.   ED Course  Procedures (including critical care time) Labs Review Labs Reviewed  CULTURE, BLOOD (ROUTINE X 2)  CULTURE, BLOOD (ROUTINE X 2)  URINE CULTURE  COMPREHENSIVE METABOLIC PANEL  URINALYSIS, ROUTINE W REFLEX MICROSCOPIC (NOT AT [facility])  CBC WITH DIFFERENTIAL/PLATELET  I-STAT CG4 LACTIC ACID, ED    Imaging Review Dg Chest 2 View  08/05/2015  CLINICAL DATA:  65 year old with current history of bladder cancer for which she is undergoing treatment with BCG. Chronic right sided back pain. Sepsis workup. EXAM: CHEST  2 VIEW COMPARISON:  07/05/2014 and earlier. FINDINGS: Cardiomediastinal silhouette unremarkable,  unchanged. Mild hyperinflation, unchanged. Lungs clear. Bronchovascular markings normal. Pulmonary vascularity normal. No visible pleural effusions. No pneumothorax. Degenerative changes involving the thoracic spine. No significant change dating back to 2013. IMPRESSION: Mild hyperinflation consistent with COPD and/or asthma. No acute cardiopulmonary disease. Electronically Signed   By: Evangeline Dakin M.D.   On: 08/05/2015 18:32   I have personally reviewed and evaluated these images and lab results as part of my medical decision-making.   EKG Interpretation None      MDM   Final diagnoses:  None    Patient given dose of IV Rocephin here for evidence of UTI. CT results reviewed with her as well as with her urologist at [facility]. i spoke with dr Odette Fraction and he has accepted the patient in transfer    Lacretia Leigh, MD 08/05/15 2300

## 2015-08-06 DIAGNOSIS — E785 Hyperlipidemia, unspecified: Secondary | ICD-10-CM | POA: Diagnosis not present

## 2015-08-06 DIAGNOSIS — K219 Gastro-esophageal reflux disease without esophagitis: Secondary | ICD-10-CM | POA: Diagnosis not present

## 2015-08-06 DIAGNOSIS — Z86018 Personal history of other benign neoplasm: Secondary | ICD-10-CM | POA: Diagnosis not present

## 2015-08-06 DIAGNOSIS — I10 Essential (primary) hypertension: Secondary | ICD-10-CM | POA: Diagnosis not present

## 2015-08-06 DIAGNOSIS — R011 Cardiac murmur, unspecified: Secondary | ICD-10-CM | POA: Diagnosis not present

## 2015-08-06 DIAGNOSIS — Z8551 Personal history of malignant neoplasm of bladder: Secondary | ICD-10-CM | POA: Diagnosis not present

## 2015-08-06 DIAGNOSIS — Z87891 Personal history of nicotine dependence: Secondary | ICD-10-CM | POA: Diagnosis not present

## 2015-08-06 DIAGNOSIS — G8929 Other chronic pain: Secondary | ICD-10-CM | POA: Diagnosis not present

## 2015-08-06 DIAGNOSIS — Z7951 Long term (current) use of inhaled steroids: Secondary | ICD-10-CM | POA: Diagnosis not present

## 2015-08-06 DIAGNOSIS — Z7982 Long term (current) use of aspirin: Secondary | ICD-10-CM | POA: Diagnosis not present

## 2015-08-06 DIAGNOSIS — R509 Fever, unspecified: Secondary | ICD-10-CM | POA: Diagnosis present

## 2015-08-06 DIAGNOSIS — Z79899 Other long term (current) drug therapy: Secondary | ICD-10-CM | POA: Diagnosis not present

## 2015-08-06 DIAGNOSIS — I2510 Atherosclerotic heart disease of native coronary artery without angina pectoris: Secondary | ICD-10-CM | POA: Diagnosis not present

## 2015-08-06 DIAGNOSIS — Z9889 Other specified postprocedural states: Secondary | ICD-10-CM | POA: Diagnosis not present

## 2015-08-06 DIAGNOSIS — F419 Anxiety disorder, unspecified: Secondary | ICD-10-CM | POA: Diagnosis not present

## 2015-08-06 DIAGNOSIS — N390 Urinary tract infection, site not specified: Secondary | ICD-10-CM | POA: Diagnosis not present

## 2015-08-06 DIAGNOSIS — Z87448 Personal history of other diseases of urinary system: Secondary | ICD-10-CM | POA: Diagnosis not present

## 2015-08-06 MED ORDER — MORPHINE SULFATE (PF) 2 MG/ML IV SOLN
2.0000 mg | Freq: Once | INTRAVENOUS | Status: AC
  Administered 2015-08-06: 2 mg via INTRAVENOUS
  Filled 2015-08-06: qty 1

## 2015-08-06 MED ORDER — PANTOPRAZOLE SODIUM 40 MG PO TBEC
40.0000 mg | DELAYED_RELEASE_TABLET | Freq: Every day | ORAL | Status: DC
  Administered 2015-08-06: 40 mg via ORAL
  Filled 2015-08-06: qty 1

## 2015-08-06 MED ORDER — ACETAMINOPHEN 325 MG PO TABS
650.0000 mg | ORAL_TABLET | Freq: Once | ORAL | Status: AC | PRN
  Administered 2015-08-06: 650 mg via ORAL
  Filled 2015-08-06: qty 2

## 2015-08-06 MED ORDER — MORPHINE SULFATE (PF) 4 MG/ML IV SOLN
4.0000 mg | INTRAVENOUS | Status: DC | PRN
  Administered 2015-08-06 (×2): 4 mg via INTRAVENOUS
  Filled 2015-08-06 (×2): qty 1

## 2015-08-06 NOTE — ED Notes (Signed)
[facility] called. Unable to transport patient until 7am

## 2015-08-07 ENCOUNTER — Ambulatory Visit: Payer: BLUE CROSS/BLUE SHIELD | Admitting: Gastroenterology

## 2015-08-08 LAB — URINE CULTURE: Culture: >=100000

## 2015-08-09 ENCOUNTER — Ambulatory Visit (INDEPENDENT_AMBULATORY_CARE_PROVIDER_SITE_OTHER): Payer: BLUE CROSS/BLUE SHIELD | Admitting: Gastroenterology

## 2015-08-09 ENCOUNTER — Telehealth (HOSPITAL_BASED_OUTPATIENT_CLINIC_OR_DEPARTMENT_OTHER): Payer: Self-pay | Admitting: Emergency Medicine

## 2015-08-09 ENCOUNTER — Encounter: Payer: Self-pay | Admitting: Gastroenterology

## 2015-08-09 VITALS — BP 100/64 | HR 84 | Ht 62.0 in | Wt 131.0 lb

## 2015-08-09 DIAGNOSIS — K219 Gastro-esophageal reflux disease without esophagitis: Secondary | ICD-10-CM | POA: Diagnosis not present

## 2015-08-09 DIAGNOSIS — K224 Dyskinesia of esophagus: Secondary | ICD-10-CM | POA: Diagnosis not present

## 2015-08-09 MED ORDER — ALUM HYDROXIDE-MAG CARBONATE 95-358 MG/15ML PO SUSP: 15.0000 mL | ORAL | Status: DC | PRN

## 2015-08-09 MED ORDER — LINACLOTIDE 145 MCG PO CAPS: 145.0000 ug | ORAL_CAPSULE | Freq: Every day | ORAL | Status: DC

## 2015-08-09 NOTE — Patient Instructions (Signed)
Take Gaviscon over the counter as needed  IB gard as needed  Follow up in 2-3- months

## 2015-08-09 NOTE — Telephone Encounter (Signed)
Post ED Visit - Positive Culture Follow-up  Culture report reviewed by antimicrobial stewardship pharmacist:  []  Elenor Quinones, Pharm.D. []  Heide Guile, Pharm.D., BCPS [x]  Parks Neptune, Pharm.D. []  Alycia Rossetti, Pharm.D., BCPS []  [place], [place].D., BCPS, AAHIVP []  Legrand Como, Pharm.D., BCPS, AAHIVP []  Milus Glazier, Pharm.D. []  Rob Evette Doffing, Pharm.D.  Positive urine culture Kelbsiella Treated with none, patient transferred to [facility] per EPIC  Hazle Nordmann 08/09/2015, 10:26 AM

## 2015-08-10 ENCOUNTER — Telehealth: Payer: Self-pay

## 2015-08-10 NOTE — Telephone Encounter (Signed)
Spoke with patient and told her I was able to get her 30 days of Linzess free to try.  If it works well we should be able to get her the 90 days for $30 deal.  Patient agreed.

## 2015-08-11 LAB — CULTURE, BLOOD (ROUTINE X 2): Culture: NO GROWTH

## 2015-08-15 ENCOUNTER — Telehealth: Payer: Self-pay | Admitting: Interventional Cardiology

## 2015-08-15 DIAGNOSIS — R002 Palpitations: Secondary | ICD-10-CM

## 2015-08-15 NOTE — Telephone Encounter (Signed)
Spoke with pt. Pt sts that she has been having intermittent palpitations, and dizziness. Pt denies syncope, chest pain, nausea, fever,chills. She was recently started on flomax to treat a ureter blockage. She was concerned that flomax was causing the dizziness, so she d/c it couple of days ago. Pt reports the following vs. This am 117/72 94bpm, this afternoon 134/80 77 bpm. She is concerned that her resting heart rate was 94bpm, pt sts "that it has never been that high". Adv pt that Dr.Smith is out of the office, I will talk with our office DOD Dr.Varanasi and call back with his recommendation. Pt voiced appreciation and verbalized understanding.  Adv pt that I have spoken with Dr.Varanasi, he feels pt should wear a 48hr holter to evaluate her palpitations. Pt is agreeable. Adv pt that I will fwd a message to [facility] to call her to schedule. Pt verbalized understanding. Update fwd to Dr.Smith

## 2015-08-15 NOTE — Telephone Encounter (Signed)
Follow up     Returning a call to the nurse---she is in town in case she needs to be seen

## 2015-08-15 NOTE — Telephone Encounter (Signed)
New message      Pt states that her HR is 94 and she feels "jittery" and "shakey".  Her bp is 117/74.  Do you think pt needs to be seen today?

## 2015-08-15 NOTE — Telephone Encounter (Signed)
Returned pt call. lmtcb if assistance is still needed 

## 2015-08-16 ENCOUNTER — Ambulatory Visit (INDEPENDENT_AMBULATORY_CARE_PROVIDER_SITE_OTHER): Payer: BLUE CROSS/BLUE SHIELD

## 2015-08-16 DIAGNOSIS — R002 Palpitations: Secondary | ICD-10-CM

## 2015-08-27 ENCOUNTER — Encounter: Payer: Self-pay | Admitting: Interventional Cardiology

## 2015-08-31 ENCOUNTER — Telehealth: Payer: Self-pay | Admitting: Interventional Cardiology

## 2015-08-31 NOTE — Telephone Encounter (Signed)
New message   Patient calling for monitor results 

## 2015-08-31 NOTE — Telephone Encounter (Signed)
Returned pt call. lmom Overall benign. Rare premature beats, No danger. Pt is to call back if any questions or concerns

## 2015-09-25 ENCOUNTER — Telehealth: Payer: Self-pay | Admitting: Gastroenterology

## 2015-09-26 NOTE — Telephone Encounter (Signed)
Discussed Linzess and how it works. She will not take any anti-diarrheal medications, but instead give her body a chance to restore formed stools naturally. Discussed stool softeners and diet. The patient will be starting chemotherapy soon for her recurrent bladder cancer. She feels well. She will titrate her bowel regime (stool softeners and Linzess) to how her bowels respond. Call back prn.

## 2015-09-28 NOTE — Progress Notes (Signed)
[name]    [id number]    [birth year]  Primary Care Physician:Shamleffer, Herschell Dimes, MD  Referring Physician: Lottie Dawson, MD [address], [place] [address]  Chief complaint:  GERD HPI:  65 year old female previously followed by  Dr. Nichola Sizer with extensive GI history. She has long-standing reflux and esophageal spasms. She is currently on Nexium 40 mg daily, but sometimes take it twice daily when she feels that she gets reflux flares. She uses sublingual Ativan as needed for her severe esophageal spasms. She also has rectal spasms for which she uses diltiazem gel. She feels her GI symptoms are worse since she's been undergoing treatment at [facility] with BCG for her bladder cancer. She is currently not going to get any further BCG treatements. She is using Carafate as needed for worsening reflux symptoms inaddition to PPI.    Outpatient Encounter Prescriptions as of 08/09/2015  Medication Sig  . acyclovir (ZOVIRAX) 400 MG tablet Take 400 mg by mouth 2 (two) times daily.  Marland Kitchen aspirin 81 MG tablet Take 81 mg by mouth at bedtime.   Marland Kitchen atorvastatin (LIPITOR) 20 MG tablet Take 20 mg by mouth at bedtime.   . calcium carbonate (TUMS - DOSED IN MG ELEMENTAL CALCIUM) 500 MG chewable tablet Chew 1 tablet by mouth as needed for indigestion or heartburn.  . [EXPIRED] celecoxib (CELEBREX) 100 MG capsule Take 100 mg by mouth 2 (two) times daily.  . Cholecalciferol (VITAMIN D) 1000 UNITS capsule Take 1,000 Units by mouth daily.  . clidinium-chlordiazePOXIDE (LIBRAX) 5-2.5 MG capsule Take 1 capsule by mouth daily as needed.   . Coenzyme Q10 (COQ10) 100 MG CAPS Take 100 mg by mouth daily.   . diazepam (VALIUM) 2 MG tablet Take 2 mg by mouth 2 (two) times daily as needed for anxiety.   . dicyclomine (BENTYL) 10 MG capsule Take 1 capsule (10 mg total) by mouth 3 (three) times daily as needed. For IBS (Patient taking differently: Take 10 mg by mouth 3  (three) times daily as needed (IBS). For IBS)  . diltiazem 2 % GEL Apply 1 application topically as needed. For rectal spasm  . doxylamine, Sleep, (UNISOM) 25 MG tablet Take 37.5 mg by mouth at bedtime.   Marland Kitchen esomeprazole (NEXIUM) 40 MG packet Take 40 mg by mouth daily before breakfast.  . estradiol (CLIMARA - DOSED IN MG/24 HR) 0.025 mg/24hr patch Place 0.025 mg onto the skin once a week.  . fluticasone (FLONASE) 50 MCG/ACT nasal spray Place into both nostrils as needed for allergies or rhinitis.  Marland Kitchen ibuprofen (ADVIL,MOTRIN) 200 MG tablet Take 600 mg by mouth every 6 (six) hours as needed for headache or moderate pain.  Marland Kitchen loperamide (IMODIUM A-D) 2 MG tablet Take 2 mg by mouth as needed for diarrhea or loose stools.  Marland Kitchen LORazepam (ATIVAN) 0.5 MG tablet Take three times a day as needed (Patient taking differently: Take 0.5 mg by mouth daily as needed (spasms). )  . nitroGLYCERIN (NITROSTAT) 0.4 MG SL tablet place 1 tablet under the tongue if needed every 5 minutes for chest pain for 3 doses IF NO RELIEF AFTER 3RD DOSE CALL PRESCRIBER OR 911.  . ondansetron (ZOFRAN) 8 MG tablet Take 1 tablet (8 mg total) by mouth every 8 (eight) hours as needed for nausea or vomiting (1/2 tablet-1 tablet).  Marland Kitchen oxybutynin (DITROPAN) 5 MG tablet Take 1 tablet (5 mg total) by mouth 3 (three)  times daily as needed for bladder spasms.  Marland Kitchen oxyCODONE (OXY IR/ROXICODONE) 5 MG immediate release tablet Take 5 mg by mouth as needed for moderate pain.   Marland Kitchen oxyCODONE-acetaminophen (PERCOCET/ROXICET) 5-325 MG tablet Take 2 tablets by mouth every 4 (four) hours as needed for moderate pain or severe pain.  . phenazopyridine (PYRIDIUM) 200 MG tablet Take 1 tablet (200 mg total) by mouth 3 (three) times daily as needed for pain.  . polyethylene glycol (MIRALAX / GLYCOLAX) packet Take 17 g by mouth daily.  Marland Kitchen PREMARIN vaginal cream Place 1 application vaginally 2 (two) times a week.  Marland Kitchen PRESCRIPTION MEDICATION BCG injection for bladder cancer  given at [place]  . Probiotic Product (PROBIOTIC ACIDOPHILUS) CAPS Take 1 capsule by mouth daily.  . ranolazine (RANEXA) 500 MG 12 hr tablet Take 1 tablet (500 mg total) by mouth 2 (two) times daily.  Orlie Dakin Sodium (STOOL SOFTENER & LAXATIVE PO) Take 2 tablets by mouth daily.  . [EXPIRED] tamsulosin (FLOMAX) 0.4 MG CAPS capsule Take by mouth.  . temazepam (RESTORIL) 15 MG capsule Take 15 mg by mouth once a week. Does not take temazepam on a specific day  . traMADol (ULTRAM) 50 MG tablet Take 1 tablet (50 mg total) by mouth every 6 (six) hours as needed.  . traMADol-acetaminophen (ULTRACET) 37.5-325 MG per tablet Take 1 tablet by mouth every 6 (six) hours as needed. (Patient taking differently: Take 1 tablet by mouth every 6 (six) hours as needed for moderate pain or severe pain. )  . trimethoprim (TRIMPEX) 100 MG tablet Take 100 mg by mouth daily.   Marland Kitchen aluminum hydroxide-magnesium carbonate (GAVISCON) 95-358 MG/15ML SUSP Take 15 mLs by mouth as needed.  . Linaclotide (LINZESS) 145 MCG CAPS capsule Take 1 capsule (145 mcg total) by mouth daily.  . [DISCONTINUED] ciprofloxacin (CIPRO) 500 MG tablet Take 1 tablet (500 mg total) by mouth 2 (two) times daily. One po bid x 7 days (Patient not taking: Reported on 08/05/2015)  . [DISCONTINUED] ketorolac (TORADOL) 10 MG tablet Take 10 mg by mouth every 6 (six) hours. Reported on 08/05/2015  . [DISCONTINUED] sucralfate (CARAFATE) 1 GM/10ML suspension Take 10 mLs (1 g total) by mouth 4 (four) times daily -  with meals and at bedtime. (Patient not taking: Reported on 07/19/2015)   No facility-administered encounter medications on file as of 08/09/2015.    Allergies as of 08/09/2015 - Review Complete 08/09/2015  Allergen Reaction Noted  . Clindamycin/lincomycin Other (See Comments) 10/05/2011  . Doxycycline Nausea Only 10/05/2011  . Escitalopram oxalate Nausea Only 10/05/2011  . Hydrocodone Itching 10/05/2011  . Levsin [hyoscyamine sulfate] Hives and  Swelling 04/29/2012  . Macrodantin Other (See Comments) 10/05/2011  . Trazodone and nefazodone Nausea Only 10/05/2011  . Trimethoprim  12/09/2013    Past Medical History  Diagnosis Date  . GERD (gastroesophageal reflux disease)   . IBS (irritable bowel syndrome)     Diarrhea predominent, esophageal spasm severe, GERD - Dr. Elicia Lamp  . Fatty liver 04/28/12  . Hyperlipidemia   . Essential hypertension, benign   . Interstitial cystitis     Dr. Gaynelle Arabian  . Memory loss     Psychiatry Dr. Stan Head, concerns for memory loss, anxiety  . Anxiety     Psychiatry Dr. Stan Head, concerns for memory loss, anxiety  . PONV (postoperative nausea and vomiting)     AND URINARY RETENTION  . Heart murmur   . History of esophageal spasm     takes ranexa  .  Esophageal motility disorder   . Chronic chest pain   . History of TIA (transient ischemic attack)     2011--  no residuals  . Sensation of pressure in bladder area   . Wears glasses   . TMJ (temporomandibular joint syndrome)   . H/O esophageal spasm   . Bladder tumor   . Coronary atherosclerosis CARDIOLOGIST-  DR Daneen Schick    Cath 11/2006 demonstrating moderate obstructive coronary disease with 70-80% septal perforator #1, 50-70% first diagonal, 80% third diagonal, 50% mid and distal LAD & heavy calcification throughout all 3 coronaries. LVF normal.  . Cancer ([place])     bladder CA    Past Surgical History  Procedure Laterality Date  . Esophagogastroduodenoscopy  04/30/2012    Procedure: ESOPHAGOGASTRODUODENOSCOPY (EGD);  Surgeon: Lafayette Dragon, MD;  Location: [facility];  Service: Endoscopy;  Laterality: N/A;  . Shoulder arthroscopy Left 12-23-2011  . Lumbar disc surgery  1973  . Appendectomy  1971    AND OVARIAN CYSTECTOMY  . Benign excisional breast bx  1996  . Cysto/  hydrodistention/  instillation therapy  03-12-2010  . Abdominal hysterectomy  AGE 69 -- 1989  . Transthoracic echocardiogram  04-21-2012    GRADE I  DIASTOLIC DYSFUNCTION/  EF 60-65%/  MILD MR  &  TR  . Negative sleep study  2010  . Cardiac catheterization  11-28-2006   DR Daneen Schick     moderate cad/  70-80% septal perforator #1, 50-70% first diagonal, 80% third diagonal, 50% mid and distal LAD & heavy calcification throughout all 3 coronaries. LVF normal.  . Cardiac catheterization  03-26-2012   DR Daneen Schick    patent CFX,  RCA,  & pLAD/  mLAD >50% to <70% a focal eccentric region that overlaps the third diagonal/  90% diagonal ostial and unchanged from prior study /   ef 65%  . Cysto with hydrodistension N/A 01/14/2014    Procedure: CYSTOSCOPY/HYDRODISTENSION/INSERTION OF MARCAINE AND PYRIDUIM INTO BLADDER/INJECTION OF MARCAINE AND KENALOG into sub trigone space ;  Surgeon: Ailene Rud, MD;  Location: [facility];  Service: Urology;  Laterality: N/A;  . Transurethral resection of bladder tumor Right 01/03/2015    Procedure: TRANSURETHRAL RESECTION OF BLADDER TUMOR (TURBT);  Surgeon: Carolan Clines, MD;  Location: [facility];  Service: Urology;  Laterality: Right;    Family History  Problem Relation Age of Onset  . Heart disease Mother   . Colon polyps Mother   . Alzheimer's disease Mother   . Dementia Mother   . CAD Mother   . Heart disease Father   . Diverticulosis Father   . CAD Father     in his 51s  . Heart disease Brother   . Colon cancer Neg Hx   . Osteoarthritis Other     Multiple family members  . Obesity Other     Multiple family members  . Depression Other     Multiple family members    Social History   Social History  . Marital Status: Married    Spouse Name: N/A  . Number of Children: 2  . Years of Education: N/A   Occupational History  . caregiver    Social History Main Topics  . Smoking status: Former Smoker -- 10 years    Types: Cigarettes    Quit date: 06/10/1973  . Smokeless tobacco: Never Used  . Alcohol Use: Yes     Comment: social  . Drug Use: No    .  Sexual Activity: Yes   Other Topics Concern  . Not on file   Social History Narrative      Review of systems: Review of Systems  Constitutional: Negative for fever and chills.  HENT: Negative.   Eyes: Negative for blurred vision.  Respiratory: Negative for cough, shortness of breath and wheezing.   Cardiovascular: Negative for chest pain and palpitations.  Gastrointestinal: as per HPI Genitourinary: Negative for dysuria, urgency, frequency and hematuria.  Musculoskeletal: Negative for myalgias, back pain and joint pain.  Skin: Negative for itching and rash.  Neurological: Negative for dizziness, tremors, focal weakness, seizures and loss of consciousness.  Endo/Heme/Allergies: Negative for environmental allergies.  Psychiatric/Behavioral: Negative for depression, suicidal ideas and hallucinations.  All other systems reviewed and are negative.   Physical Exam: Filed Vitals:   08/09/15 1446  BP: 100/64  Pulse: 84   Gen:      No acute distress HEENT:  EOMI, sclera anicteric Neck:     No masses; no thyromegaly Lungs:    Clear to auscultation bilaterally; normal respiratory effort CV:         Regular rate and rhythm; no murmurs Abd:      + bowel sounds; soft, non-tender; no palpable masses, no distension Ext:    No edema; adequate peripheral perfusion Skin:      Warm and dry; no rash Neuro: alert and oriented x 3 Psych: normal mood and affect  Data Reviewed:  Reviewed chart in epic   Assessment and Plan/Recommendations: 39 yr F with h/o bladder cancer, chronic GERD and esophageal spasm is here for follow up visit Continue PPI daily, 30 mins before breakfast Esophageal spasm likely induced by acid reflux vs ?esophageal dysmotility Patient is unwilling to try anything at this point feels, she has tried multiple different medications and only ativan helps relieve the spasm Ok to use PRN carafate or Gaviscon for breakthrough heartburn Also discussed trial of IB guard  and peppermint tea to improve esophageal spasms  Damaris Hippo , MD [phone] Mon-Fri 8a-5p [phone] after 5p, weekends, holidays

## 2015-09-29 ENCOUNTER — Other Ambulatory Visit: Payer: Self-pay | Admitting: *Deleted

## 2015-09-29 MED ORDER — NITROGLYCERIN 0.4 MG SL SUBL: SUBLINGUAL_TABLET | SUBLINGUAL | Status: DC

## 2015-10-06 ENCOUNTER — Ambulatory Visit: Payer: BLUE CROSS/BLUE SHIELD | Admitting: Gastroenterology

## 2016-01-11 ENCOUNTER — Encounter: Payer: Self-pay | Admitting: Gastroenterology

## 2016-01-11 ENCOUNTER — Ambulatory Visit (INDEPENDENT_AMBULATORY_CARE_PROVIDER_SITE_OTHER): Payer: BLUE CROSS/BLUE SHIELD | Admitting: Gastroenterology

## 2016-01-11 VITALS — BP 118/70 | HR 74 | Ht 62.0 in | Wt 128.0 lb

## 2016-01-11 DIAGNOSIS — K224 Dyskinesia of esophagus: Secondary | ICD-10-CM | POA: Diagnosis not present

## 2016-01-11 DIAGNOSIS — K5909 Other constipation: Secondary | ICD-10-CM | POA: Diagnosis not present

## 2016-01-11 DIAGNOSIS — K594 Anal spasm: Secondary | ICD-10-CM

## 2016-01-11 DIAGNOSIS — K5900 Constipation, unspecified: Secondary | ICD-10-CM | POA: Insufficient documentation

## 2016-01-11 MED ORDER — LINACLOTIDE 72 MCG PO CAPS: 72.0000 ug | ORAL_CAPSULE | Freq: Every day | ORAL | 2 refills | Status: DC

## 2016-01-11 MED ORDER — LORAZEPAM 0.5 MG PO TABS: ORAL_TABLET | ORAL | 0 refills | Status: DC

## 2016-01-11 NOTE — Progress Notes (Signed)
     01/11/2016 [name] [id number] May 25, 1951   History of Present Illness:  65 year old female previously followed by  Dr. Nichola Sizer with extensive GI history.  Now following with Dr. Silverio Decamp.  She has long-standing reflux and esophageal spasms. She is currently on Nexium 40 mg daily, but sometimes take it twice daily when she feels that she gets reflux flares. She uses sublingual Ativan as needed for her severe esophageal spasms and is asking for a refill of this. She also has rectal spasms for which she uses diltiazem gel. She is following at [facility] with BCG for her bladder cancer, but the treatments are currently on hold due to side effects.  She is here today stating that her rectal spasms have become much more frequent and she is asking if we can "take a look" to make sure there is nothing else going on.  Her last colonoscopy was in October 2014 at which time she was found to have small internal hemorrhoids and repeat was recommended in 10 years from that time.  She also complains of constipation. Says that MiraLAX makes her feel bloated.  Tried Linzess 145 mcg daily but thought that it was too much.   Current Medications, Allergies, Past Medical History, Past Surgical History, Family History and Social History were reviewed in [employer] record.   Physical Exam: BP 118/70 (BP Location: Left Arm, Patient Position: Sitting)   Pulse 74   Ht 5\' 2"  (1.575 m)   Wt 128 lb (58.1 kg)   SpO2 99%   BMI 23.41 kg/m  General: Well developed white female in no acute distress Head: Normocephalic and atraumatic Eyes:  Sclerae anicteric, conjunctiva pink  Ears: Normal auditory acuity Lungs: Clear throughout to auscultation Heart: Regular rate and rhythm Abdomen: Soft, non-distended.  Normal bowel sounds.  Non-tender. Rectal:  Will be done at the time of flex sig. Musculoskeletal: Symmetrical with no gross deformities  Extremities: No edema  Neurological: Alert oriented  x 4, grossly non-focal Psychological:  Alert and cooperative. Normal mood and affect  Assessment and Recommendations: -Esophageal spasm:  Uses lorazepam intermittently as needed for this. She is asking for refill today, which we will provide. -Rectal spasm:  Usually manages these with diltiazem gel.  They have become much more frequent recently. We will schedule her for a flexible sigmoidoscopy with Dr.  Silverio Decamp to evaluate and be sure there are no more serious issues. -Constipation:  Felt like Linzess 145 mcg may have been too strong.  Would like to try the 72 mcg dose and see how she does with that.  Will provide samples and prescription.  *The risks, benefits, and alternatives to flex sig were discussed with the patient and she consents to proceed.

## 2016-01-11 NOTE — Patient Instructions (Signed)
We sent prescriptions to [place]. 1. Linzess 72 mcg. 2. Ativan ( Lorazepam).   You have been scheduled for an endoscopy. Please follow written instructions given to you at your visit today. If you use inhalers (even only as needed), please bring them with you on the day of your procedure. Your physician has requested that you go to www.startemmi.com and enter the access code given to you at your visit today. This web site gives a general overview about your procedure. However, you should still follow specific instructions given to you by our office regarding your preparation for the procedure.

## 2016-01-12 NOTE — Progress Notes (Signed)
Reviewed and agree with documentation and assessment and plan. K. Veena Nandigam , MD   

## 2016-01-22 ENCOUNTER — Encounter: Payer: Self-pay | Admitting: Gastroenterology

## 2016-01-22 ENCOUNTER — Ambulatory Visit (AMBULATORY_SURGERY_CENTER): Payer: BLUE CROSS/BLUE SHIELD | Admitting: Gastroenterology

## 2016-01-22 VITALS — BP 144/70 | HR 57 | Temp 97.8°F | Resp 24

## 2016-01-22 DIAGNOSIS — K594 Anal spasm: Secondary | ICD-10-CM | POA: Diagnosis not present

## 2016-01-22 DIAGNOSIS — K6289 Other specified diseases of anus and rectum: Secondary | ICD-10-CM | POA: Diagnosis not present

## 2016-01-22 MED ORDER — SODIUM CHLORIDE 0.9 % IV SOLN: 500.0000 mL | INTRAVENOUS | Status: DC

## 2016-01-22 NOTE — Progress Notes (Signed)
Report to PACU, RN, vss, BBS= Clear.  

## 2016-01-22 NOTE — Op Note (Signed)
[facility] Patient Name: [name] Procedure Date: 01/22/2016 1:31 PM MRN: [id number] Endoscopist: Mauri Pole , MD Age: 65 Referring MD:  Date of Birth: 01/06/1951 Gender: Female Account #: [id number] Procedure:                Flexible Sigmoidoscopy Indications:              Pelvic pain Medicines:                Monitored Anesthesia Care Procedure:                Pre-Anesthesia Assessment:                           - Prior to the procedure, a History and Physical                            was performed, and patient medications and                            allergies were reviewed. The patient's tolerance of                            previous anesthesia was also reviewed. The risks                            and benefits of the procedure and the sedation                            options and risks were discussed with the patient.                            All questions were answered, and informed consent                            was obtained. Prior Anticoagulants: The patient has                            taken no previous anticoagulant or antiplatelet                            agents. ASA Grade Assessment: III - A patient with                            severe systemic disease. After reviewing the risks                            and benefits, the patient was deemed in                            satisfactory condition to undergo the procedure.                           After obtaining informed consent, the scope was  passed under direct vision. The Model PCF-H190L                            [id number]) scope was introduced through the anus                            and advanced to the the splenic flexure. The                            flexible sigmoidoscopy was accomplished without                            difficulty. The patient tolerated the procedure                            well. The quality of the bowel preparation  was                            adequate. Scope In: 1:41:00 PM Scope Out: 1:48:23 PM Scope Withdrawal Time: 0 hours 3 minutes 31 seconds  Total Procedure Duration: 0 hours 7 minutes 23 seconds  Findings:                 The perianal and digital rectal examinations were                            normal.                           Non-bleeding internal hemorrhoids were found during                            retroflexion. The hemorrhoids were small.                           The exam was otherwise without abnormality. Complications:            No immediate complications. Estimated Blood Loss:     Estimated blood loss: none. Impression:               - Non-bleeding internal hemorrhoids.                           - The examination was otherwise normal.                           - No specimens collected. Recommendation:           - Resume previous diet.                           - Continue present medications.                           - Repeat colonoscopy in 7 years for screening                            purposes. Mauri Pole, MD 01/22/2016  1:54:41 PM This report has been signed electronically.

## 2016-01-22 NOTE — Patient Instructions (Signed)
Hemorrhoids seen. Handout given on hemorrhoids. Resume current medications. Repeat colonoscopy in 7 years. Call us with any questions or concerns. Thank you!  YOU HAD AN ENDOSCOPIC PROCEDURE TODAY AT [facility]:   Refer to the procedure report that was given to you for any specific questions about what was found during the examination.  If the procedure report does not answer your questions, please call your gastroenterologist to clarify.  If you requested that your care partner not be given the details of your procedure findings, then the procedure report has been included in a sealed envelope for you to review at your convenience later.  YOU SHOULD EXPECT: Some feelings of bloating in the abdomen. Passage of more gas than usual.  Walking can help get rid of the air that was put into your GI tract during the procedure and reduce the bloating. If you had a lower endoscopy (such as a colonoscopy or flexible sigmoidoscopy) you may notice spotting of blood in your stool or on the toilet paper. If you underwent a bowel prep for your procedure, you may not have a normal bowel movement for a few days.  Please Note:  You might notice some irritation and congestion in your nose or some drainage.  This is from the oxygen used during your procedure.  There is no need for concern and it should clear up in a day or so.  SYMPTOMS TO REPORT IMMEDIATELY:   Following lower endoscopy (colonoscopy or flexible sigmoidoscopy):  Excessive amounts of blood in the stool  Significant tenderness or worsening of abdominal pains  Swelling of the abdomen that is new, acute  Fever of 100F or higher  For urgent or emergent issues, a gastroenterologist can be reached at any hour by calling [phone].   DIET: Your first meal following the procedure should be a small meal and then it is ok to progress to your normal diet. Heavy or fried foods are harder to digest and may make you feel nauseous or  bloated.  Likewise, meals heavy in dairy and vegetables can increase bloating.  Drink plenty of fluids but you should avoid alcoholic beverages for 24 hours.  ACTIVITY:  You should plan to take it easy for the rest of today and you should NOT DRIVE or use heavy machinery until tomorrow (because of the sedation medicines used during the test).    FOLLOW UP: Our staff will call the number listed on your records the next business day following your procedure to check on you and address any questions or concerns that you may have regarding the information given to you following your procedure. If we do not reach you, we will leave a message.  However, if you are feeling well and you are not experiencing any problems, there is no need to return our call.  We will assume that you have returned to your regular daily activities without incident.  If any biopsies were taken you will be contacted by phone or by letter within the next 1-3 weeks.  Please call us at [phone] if you have not heard about the biopsies in 3 weeks.    SIGNATURES/CONFIDENTIALITY: You and/or your care partner have signed paperwork which will be entered into your electronic medical record.  These signatures attest to the fact that that the information above on your After Visit Summary has been reviewed and is understood.  Full responsibility of the confidentiality of this discharge information lies with you and/or your care-partner.

## 2016-01-23 ENCOUNTER — Telehealth: Payer: Self-pay | Admitting: *Deleted

## 2016-01-23 NOTE — Telephone Encounter (Signed)
--   Message ----- From: Mauri Pole, MD Sent: 01/22/2016   2:35 PM To: Oda Kilts, CMA  Hi Ricard Faulkner,  Can you please send referral to pelvic floor PT for pelvic/rectal spasms. She was going to [place] before with Urology and prefers to go back to her Thanks VN

## 2016-01-23 NOTE — Telephone Encounter (Signed)
  Follow up Call-  Call back number 01/22/2016  Post procedure Call Back phone  # [phone]  Permission to leave phone message Yes  Some recent data might be hidden     Patient questions:  Do you have a fever, pain , or abdominal swelling? No. Pain Score  0 *  Have you tolerated food without any problems? Yes.    Have you been able to return to your normal activities? Yes.    Do you have any questions about your discharge instructions: Diet   No. Medications  No. Follow up visit  No.  Do you have questions or concerns about your Care? No.  Actions: * If pain score is 4 or above: No action needed, pain <4.

## 2016-01-26 NOTE — Telephone Encounter (Signed)
Referral faxed today to [facility]

## 2016-01-26 NOTE — Telephone Encounter (Signed)
[facility] they have received the patients records and will contact the patient to schedule her appointment with Ileana Roup

## 2016-01-30 NOTE — Telephone Encounter (Signed)
Appointment scheduled with [facility] for 02/07/2016 at 12pm with Ileana Roup Patient aware

## 2016-02-03 ENCOUNTER — Other Ambulatory Visit: Payer: Self-pay | Admitting: Gastroenterology

## 2016-02-05 ENCOUNTER — Other Ambulatory Visit: Payer: Self-pay | Admitting: *Deleted

## 2016-02-05 MED ORDER — ESOMEPRAZOLE MAGNESIUM 40 MG PO CPDR: 40.0000 mg | DELAYED_RELEASE_CAPSULE | Freq: Every day | ORAL | 3 refills | Status: DC

## 2016-02-13 ENCOUNTER — Other Ambulatory Visit: Payer: Self-pay | Admitting: Geriatric Medicine

## 2016-02-13 DIAGNOSIS — Z1231 Encounter for screening mammogram for malignant neoplasm of breast: Secondary | ICD-10-CM

## 2016-03-25 ENCOUNTER — Encounter: Payer: Self-pay | Admitting: Interventional Cardiology

## 2016-03-25 ENCOUNTER — Ambulatory Visit (INDEPENDENT_AMBULATORY_CARE_PROVIDER_SITE_OTHER): Payer: BLUE CROSS/BLUE SHIELD | Admitting: Interventional Cardiology

## 2016-03-25 VITALS — BP 134/70 | HR 65 | Ht 62.5 in | Wt 131.0 lb

## 2016-03-25 DIAGNOSIS — E784 Other hyperlipidemia: Secondary | ICD-10-CM | POA: Diagnosis not present

## 2016-03-25 DIAGNOSIS — I2510 Atherosclerotic heart disease of native coronary artery without angina pectoris: Secondary | ICD-10-CM

## 2016-03-25 DIAGNOSIS — I10 Essential (primary) hypertension: Secondary | ICD-10-CM | POA: Diagnosis not present

## 2016-03-25 DIAGNOSIS — E7849 Other hyperlipidemia: Secondary | ICD-10-CM

## 2016-03-25 NOTE — Patient Instructions (Signed)
Medication Instructions:  Your physician recommends that you continue on your current medications as directed. Please refer to the Current Medication list given to you today.  Start back on Atorvastatin 20 mg daily.    Labwork: Your physician recommends that you return for lab work in: 2 months. This will be fasting. The lab opens at 7:30 AM--Scheduled for December 19,2017   Testing/Procedures: none  Follow-Up: Your physician wants you to follow-up in: 12 months.  You will receive a reminder letter in the mail two months in advance. If you don't receive a letter, please call our office to schedule the follow-up appointment.   Any Other Special Instructions Will Be Listed Below (If Applicable).     If you need a refill on your cardiac medications before your next appointment, please call your pharmacy.

## 2016-03-25 NOTE — Progress Notes (Signed)
Cardiology Office Note    Date:  03/25/2016   ID:  [name], DOB 1950/07/28, MRN [id number]  PCP:  Leeroy Cha, MD  Cardiologist: Sinclair Grooms, MD   Chief Complaint  Patient presents with  . Coronary Artery Disease    History of Present Illness:  [name] is a 65 y.o. female who presents for diffuse CAD and cerebrovascular disease, memory impairment, hyperlipidemia, essential hypertension, and anxiety disorder.  She is doing okay. Stress her family life. She has bladder cancer but therapies are not going well. She denies chest pain of any significance. She still exercises. Her daughter is having some neurological issues. Nitroglycerin use his been rare. Ranexa use his been rare.   Past Medical History:  Diagnosis Date  . Anxiety    Psychiatry Dr. Stan Head, concerns for memory loss, anxiety  . Bladder tumor   . Cancer ([place])    bladder CA  . Chronic chest pain   . Coronary atherosclerosis CARDIOLOGIST-  DR Daneen Schick   Cath 11/2006 demonstrating moderate obstructive coronary disease with 70-80% septal perforator #1, 50-70% first diagonal, 80% third diagonal, 50% mid and distal LAD & heavy calcification throughout all 3 coronaries. LVF normal.  . Esophageal motility disorder   . Essential hypertension, benign   . Fatty liver 04/28/12  . GERD (gastroesophageal reflux disease)   . H/O esophageal spasm   . Heart murmur   . History of esophageal spasm    takes ranexa  . History of TIA (transient ischemic attack)    2011--  no residuals  . Hyperlipidemia   . IBS (irritable bowel syndrome)    Diarrhea predominent, esophageal spasm severe, GERD - Dr. Elicia Lamp  . Interstitial cystitis    Dr. Gaynelle Arabian  . Memory loss    Psychiatry Dr. Stan Head, concerns for memory loss, anxiety  . PONV (postoperative nausea and vomiting)    AND URINARY RETENTION  . Sensation of pressure in bladder area   . TMJ (temporomandibular joint syndrome)   .  Wears glasses     Past Surgical History:  Procedure Laterality Date  . ABDOMINAL HYSTERECTOMY  AGE 33 -- 1989  . APPENDECTOMY  1971   AND OVARIAN CYSTECTOMY  . BENIGN EXCISIONAL BREAST BX  1996  . CARDIAC CATHETERIZATION  11-28-2006   DR Daneen Schick    moderate cad/  70-80% septal perforator #1, 50-70% first diagonal, 80% third diagonal, 50% mid and distal LAD & heavy calcification throughout all 3 coronaries. LVF normal.  . CARDIAC CATHETERIZATION  03-26-2012   DR Daneen Schick   patent CFX,  RCA,  & pLAD/  mLAD >50% to <70% a focal eccentric region that overlaps the third diagonal/  90% diagonal ostial and unchanged from prior study /   ef 65%  . CYSTO WITH HYDRODISTENSION N/A 01/14/2014   Procedure: CYSTOSCOPY/HYDRODISTENSION/INSERTION OF MARCAINE AND PYRIDUIM INTO BLADDER/INJECTION OF MARCAINE AND KENALOG into sub trigone space ;  Surgeon: Ailene Rud, MD;  Location: [facility];  Service: Urology;  Laterality: N/A;  . CYSTO/  HYDRODISTENTION/  INSTILLATION THERAPY  03-12-2010  . ESOPHAGOGASTRODUODENOSCOPY  04/30/2012   Procedure: ESOPHAGOGASTRODUODENOSCOPY (EGD);  Surgeon: Lafayette Dragon, MD;  Location: [facility];  Service: Endoscopy;  Laterality: N/A;  . LUMBAR [place]  . NEGATIVE SLEEP STUDY  2010  . SHOULDER ARTHROSCOPY Left 12-23-2011  . TRANSTHORACIC ECHOCARDIOGRAM  04-21-2012   GRADE I DIASTOLIC DYSFUNCTION/  EF 60-65%/  MILD MR  &  TR  . TRANSURETHRAL RESECTION OF BLADDER TUMOR Right 01/03/2015   Procedure: TRANSURETHRAL RESECTION OF BLADDER TUMOR (TURBT);  Surgeon: Carolan Clines, MD;  Location: [facility];  Service: Urology;  Laterality: Right;    Current Medications: Outpatient Medications Prior to Visit  Medication Sig Dispense Refill  . acyclovir (ZOVIRAX) 400 MG tablet Take 400 mg by mouth 2 (two) times daily.    Marland Kitchen aspirin 81 MG tablet Take 81 mg by mouth at bedtime.     . calcium carbonate (TUMS - DOSED IN MG  ELEMENTAL CALCIUM) 500 MG chewable tablet Chew 1 tablet by mouth as needed for indigestion or heartburn.    . Cholecalciferol (VITAMIN D) 1000 UNITS capsule Take 1,000 Units by mouth daily.    . clidinium-chlordiazePOXIDE (LIBRAX) 5-2.5 MG capsule Take 1 capsule by mouth daily as needed.     . Coenzyme Q10 (COQ10) 100 MG CAPS Take 100 mg by mouth daily.     Marland Kitchen diltiazem 2 % GEL Apply 1 application topically as needed. For rectal spasm 30 g 1  . doxylamine, Sleep, (UNISOM) 25 MG tablet Take 37.5 mg by mouth at bedtime.     Marland Kitchen esomeprazole (NEXIUM) 40 MG packet Take 40 mg by mouth daily before breakfast. 30 each 6  . estradiol (CLIMARA - DOSED IN MG/24 HR) 0.025 mg/24hr patch Place 0.025 mg onto the skin once a week.    . fluticasone (FLONASE) 50 MCG/ACT nasal spray Place into both nostrils as needed for allergies or rhinitis.    Marland Kitchen ibuprofen (ADVIL,MOTRIN) 200 MG tablet Take 600 mg by mouth every 6 (six) hours as needed for headache or moderate pain.    Marland Kitchen loperamide (IMODIUM A-D) 2 MG tablet Take 2 mg by mouth as needed for diarrhea or loose stools.    Marland Kitchen LORazepam (ATIVAN) 0.5 MG tablet Take three times a day as needed 30 tablet 0  . nitroGLYCERIN (NITROSTAT) 0.4 MG SL tablet place 1 tablet under the tongue if needed every 5 minutes for chest pain for 3 doses IF NO RELIEF AFTER 3RD DOSE CALL PRESCRIBER OR 911. 75 tablet 1  . oxyCODONE (OXY IR/ROXICODONE) 5 MG immediate release tablet Take 5 mg by mouth as needed for moderate pain.     Marland Kitchen PREMARIN vaginal cream Place 1 application vaginally 2 (two) times a week.    Marland Kitchen PRESCRIPTION MEDICATION BCG injection for bladder cancer given at [place]    . Probiotic Product (PROBIOTIC ACIDOPHILUS) CAPS Take 1 capsule by mouth daily.    . ranolazine (RANEXA) 500 MG 12 hr tablet Take 1 tablet (500 mg total) by mouth 2 (two) times daily. 180 tablet 3  . temazepam (RESTORIL) 15 MG capsule Take 15 mg by mouth once a week. Does not take temazepam on a specific day    .  atorvastatin (LIPITOR) 20 MG tablet Take 20 mg by mouth at bedtime.     . diazepam (VALIUM) 2 MG tablet Take 2 mg by mouth 2 (two) times daily as needed for anxiety.     . dicyclomine (BENTYL) 10 MG capsule Take 1 capsule (10 mg total) by mouth 3 (three) times daily as needed. For IBS (Patient not taking: Reported on 03/25/2016) 270 capsule 3  . esomeprazole (NEXIUM) 40 MG capsule Take 1 capsule (40 mg total) by mouth daily. (Patient not taking: Reported on 03/25/2016) 90 capsule 3  . linaclotide (LINZESS) 72 MCG capsule Take 1 capsule (72 mcg total) by mouth daily before breakfast. (Patient not taking:  Reported on 03/25/2016) 30 capsule 2  . ondansetron (ZOFRAN) 8 MG tablet Take 1 tablet (8 mg total) by mouth every 8 (eight) hours as needed for nausea or vomiting (1/2 tablet-1 tablet). (Patient not taking: Reported on 03/25/2016) 20 tablet 0  . oxybutynin (DITROPAN) 5 MG tablet Take 1 tablet (5 mg total) by mouth 3 (three) times daily as needed for bladder spasms. (Patient not taking: Reported on 03/25/2016) 30 tablet 0  . oxyCODONE-acetaminophen (PERCOCET/ROXICET) 5-325 MG tablet Take 2 tablets by mouth every 4 (four) hours as needed for moderate pain or severe pain.    . phenazopyridine (PYRIDIUM) 200 MG tablet Take 1 tablet (200 mg total) by mouth 3 (three) times daily as needed for pain. (Patient not taking: Reported on 03/25/2016) 30 tablet 0  . polyethylene glycol (MIRALAX / GLYCOLAX) packet Take 17 g by mouth daily. (Patient not taking: Reported on 03/25/2016) 14 each 0  . Sennosides-Docusate Sodium (STOOL SOFTENER & LAXATIVE PO) Take 2 tablets by mouth daily.    . traMADol (ULTRAM) 50 MG tablet Take 1 tablet (50 mg total) by mouth every 6 (six) hours as needed. (Patient not taking: Reported on 03/25/2016) 20 tablet 0  . traMADol-acetaminophen (ULTRACET) 37.5-325 MG per tablet Take 1 tablet by mouth every 6 (six) hours as needed. (Patient not taking: Reported on 03/25/2016) 30 tablet 2    Facility-Administered Medications Prior to Visit  Medication Dose Route Frequency Provider Last Rate Last Dose  . 0.9 %  sodium chloride infusion  500 mL Intravenous Continuous Mauri Pole, MD         Allergies:   Clindamycin/lincomycin; Doxycycline; Escitalopram oxalate; Hydrocodone; Levsin [hyoscyamine sulfate]; Macrodantin; Trazodone and nefazodone; and Trimethoprim   Social History   Social History  . Marital status: Married    Spouse name: N/A  . Number of children: 2  . Years of education: N/A   Occupational History  . caregiver    Social History Main Topics  . Smoking status: Former Smoker    Years: 10.00    Types: Cigarettes    Quit date: 06/10/1973  . Smokeless tobacco: Never Used  . Alcohol use 1.2 oz/week    2 Glasses of wine per week     Comment: social  . Drug use: No  . Sexual activity: Yes   Other Topics Concern  . None   Social History Narrative  . None     Family History:  The patient's family history includes Alzheimer's disease in her mother; CAD in her father and mother; Colon polyps in her mother; Dementia in her mother; Depression in her other; Diverticulosis in her father; Heart disease in her brother, father, and mother; Obesity in her other; Osteoarthritis in her other.   ROS:   Please see the history of present illness.    Bladder cancer with hematuria, hearing loss, fatigue.  All other systems reviewed and are negative.   PHYSICAL EXAM:   VS:  BP 134/70   Pulse 65   Ht 5' 2.5" (1.588 m)   Wt 131 lb (59.4 kg)   BMI 23.58 kg/m    GEN: Well nourished, well developed, in no acute distress  HEENT: normal  Neck: no JVD, carotid bruits, or masses Cardiac: RRR; no murmurs, rubs, or gallops,no edema  Respiratory:  clear to auscultation bilaterally, normal work of breathing GI: soft, nontender, nondistended, + BS MS: no deformity or atrophy  Skin: warm and dry, no rash Neuro:  Alert and Oriented x 3, Strength and  sensation are  intact Psych: euthymic mood, full affect  Wt Readings from Last 3 Encounters:  03/25/16 131 lb (59.4 kg)  01/11/16 128 lb (58.1 kg)  08/09/15 131 lb (59.4 kg)      Studies/Labs Reviewed:   EKG:  EKG  Normal sinus rhythm.  Recent Labs: 08/05/2015: ALT 50; BUN 12; Creatinine, Ser 0.82; Hemoglobin 10.6; Platelets 268; Potassium 4.1; Sodium 134   Lipid Panel    Component Value Date/Time   CHOL  09/30/2009 0447    128        ATP III CLASSIFICATION:  <200     mg/dL   Desirable  200-239  mg/dL   Borderline High  >=240    mg/dL   High          TRIG 126 09/30/2009 0447   HDL 43 09/30/2009 0447   CHOLHDL 3.0 09/30/2009 0447   VLDL 25 09/30/2009 0447   LDLCALC  09/30/2009 0447    60        Total Cholesterol/HDL:CHD Risk Coronary Heart Disease Risk Table                     Men   Women  1/2 Average Risk   3.4   3.3  Average Risk       5.0   4.4  2 X Average Risk   9.6   7.1  3 X Average Risk  23.4   11.0        Use the calculated Patient Ratio above and the CHD Risk Table to determine the patient's CHD Risk.        ATP III CLASSIFICATION (LDL):  <100     mg/dL   Optimal  100-129  mg/dL   Near or Above                    Optimal  130-159  mg/dL   Borderline  160-189  mg/dL   High  >190     mg/dL   Very High    Additional studies/ records that were reviewed today include:  None    ASSESSMENT:    1. Coronary artery disease involving native coronary artery of native heart without angina pectoris   2. Essential hypertension   3. Other hyperlipidemia      PLAN:  In order of problems listed above:  1. Doing well without change in anginal pattern. Nitroglycerin use is rare. Ranexa uses also when necessary. Exercising without great limitations. 2. 2 g sodium diet. Continue physical activity. Target blood pressure 130/90 or less. 3. She is currently on a statin free. Because of muscle aches. She plans to go back on the medication in 2 weeks. We will check lipid panel  with a liver panel in 8 weeks.    Medication Adjustments/Labs and Tests Ordered: Current medicines are reviewed at length with the patient today.  Concerns regarding medicines are outlined above.  Medication changes, Labs and Tests ordered today are listed in the Patient Instructions below. Patient Instructions  Medication Instructions:  Your physician recommends that you continue on your current medications as directed. Please refer to the Current Medication list given to you today.  Start back on Atorvastatin 20 mg daily.    Labwork: Your physician recommends that you return for lab work in: 2 months. This will be fasting. The lab opens at 7:30 AM--Scheduled for December 19,2017   Testing/Procedures: none  Follow-Up: Your physician wants you to follow-up in: 12 months.  You will receive a reminder letter in the mail two months in advance. If you don't receive a letter, please call our office to schedule the follow-up appointment.   Any Other Special Instructions Will Be Listed Below (If Applicable).     If you need a refill on your cardiac medications before your next appointment, please call your pharmacy.      Signed, Sinclair Grooms, MD  03/25/2016 3:47 PM    [facility] [address], [place], [place]  [address] Phone: [phone]; Fax: [phone]

## 2016-03-26 ENCOUNTER — Ambulatory Visit
Admission: RE | Admit: 2016-03-26 | Discharge: 2016-03-26 | Disposition: A | Discharge status: Home / Self Care | Payer: BLUE CROSS/BLUE SHIELD | Source: Ambulatory Visit | Attending: Geriatric Medicine | Admitting: Geriatric Medicine

## 2016-03-26 DIAGNOSIS — Z1231 Encounter for screening mammogram for malignant neoplasm of breast: Secondary | ICD-10-CM

## 2016-04-11 ENCOUNTER — Other Ambulatory Visit: Payer: Self-pay | Admitting: Interventional Cardiology

## 2016-04-11 MED ORDER — RANOLAZINE ER 500 MG PO TB12: 500.0000 mg | ORAL_TABLET | Freq: Two times a day (BID) | ORAL | 3 refills | Status: DC

## 2016-04-12 ENCOUNTER — Other Ambulatory Visit: Payer: Self-pay | Admitting: Interventional Cardiology

## 2016-04-12 ENCOUNTER — Telehealth: Payer: Self-pay | Admitting: Interventional Cardiology

## 2016-04-12 MED ORDER — RANOLAZINE ER 500 MG PO TB12: 500.0000 mg | ORAL_TABLET | Freq: Two times a day (BID) | ORAL | 3 refills | Status: DC

## 2016-04-12 NOTE — Telephone Encounter (Signed)
New Message   *STAT* If patient is at the pharmacy, call can be transferred to refill team.   1. Which medications need to be refilled? (please list name of each medication and dose if known)  Ranexa 500 mg 12 hr tablet twice daily.  2. Which pharmacy/location (including street and city if local pharmacy) is medication to be sent to? [facility] and not [facility]. See pharmacist listed  3. Do they need a 30 day or 90 day supply?  90 day supply

## 2016-04-12 NOTE — Telephone Encounter (Signed)
Resent pt's Rx to [facility], confirmation received.

## 2016-05-28 ENCOUNTER — Other Ambulatory Visit: Payer: BLUE CROSS/BLUE SHIELD | Admitting: *Deleted

## 2016-05-28 DIAGNOSIS — E7849 Other hyperlipidemia: Secondary | ICD-10-CM

## 2016-05-28 LAB — LIPID PANEL
Cholesterol: 195 mg/dL (ref <200)
HDL: 67 mg/dL (ref >50)
LDL Cholesterol: 112 mg/dL — ABNORMAL HIGH (ref <100)
Total CHOL/HDL Ratio: 2.9 Ratio (ref <5.0)
Triglycerides: 80 mg/dL (ref <150)
VLDL: 16 mg/dL (ref <30)

## 2016-05-28 LAB — HEPATIC FUNCTION PANEL
ALT: 31 U/L — ABNORMAL HIGH (ref 6–29)
AST: 30 U/L (ref 10–35)
Albumin: 4.3 g/dL (ref 3.6–5.1)
Alkaline Phosphatase: 73 U/L (ref 33–130)
Bilirubin, Direct: 0.1 mg/dL (ref <=0.2)
Indirect Bilirubin: 0.3 mg/dL (ref 0.2–1.2)
Total Bilirubin: 0.4 mg/dL (ref 0.2–1.2)
Total Protein: 6.9 g/dL (ref 6.1–8.1)

## 2016-06-05 ENCOUNTER — Telehealth: Payer: Self-pay | Admitting: Interventional Cardiology

## 2016-06-05 NOTE — Telephone Encounter (Signed)
New message  Pt call requesting to speak with RN about her lab work results. Please call back to discuss

## 2016-06-05 NOTE — Telephone Encounter (Signed)
F/u Message ° °Pt returning RN call. Please call back to discuss  °

## 2016-06-05 NOTE — Telephone Encounter (Signed)
Patient called and had a question about her lab results. Labs were reviewed with the patient and informed the patient the Dr. Tamala Julian would like for her to try and correct her elevated LDL with diet and exercise. Patient verbalized understanding and appreciated the call.

## 2016-06-05 NOTE — Telephone Encounter (Signed)
Left message for patient to call back  

## 2016-08-27 ENCOUNTER — Other Ambulatory Visit: Payer: Self-pay | Admitting: Gastroenterology

## 2016-08-27 NOTE — Telephone Encounter (Signed)
We can refill for now, but she needs an appt with Dr. Silverio Decamp next available to discuss whether she will continue to fill it for her in the future.  I do not think that she has ever seen Dr. Silverio Decamp yet.  Thanks,  [employer]

## 2016-08-27 NOTE — Telephone Encounter (Signed)
Can we refill? 

## 2016-10-07 DIAGNOSIS — M65331 Trigger finger, right middle finger: Secondary | ICD-10-CM | POA: Diagnosis not present

## 2016-10-15 DIAGNOSIS — M65331 Trigger finger, right middle finger: Secondary | ICD-10-CM | POA: Diagnosis not present

## 2016-10-29 DIAGNOSIS — J342 Deviated nasal septum: Secondary | ICD-10-CM | POA: Diagnosis not present

## 2016-10-29 DIAGNOSIS — H6122 Impacted cerumen, left ear: Secondary | ICD-10-CM | POA: Diagnosis not present

## 2016-10-29 DIAGNOSIS — J343 Hypertrophy of nasal turbinates: Secondary | ICD-10-CM | POA: Diagnosis not present

## 2016-10-29 DIAGNOSIS — J310 Chronic rhinitis: Secondary | ICD-10-CM | POA: Diagnosis not present

## 2016-10-30 DIAGNOSIS — F5101 Primary insomnia: Secondary | ICD-10-CM | POA: Diagnosis not present

## 2016-11-06 ENCOUNTER — Ambulatory Visit (INDEPENDENT_AMBULATORY_CARE_PROVIDER_SITE_OTHER): Payer: Medicare Other | Admitting: Cardiology

## 2016-11-06 ENCOUNTER — Ambulatory Visit (INDEPENDENT_AMBULATORY_CARE_PROVIDER_SITE_OTHER): Payer: Medicare Other

## 2016-11-06 ENCOUNTER — Other Ambulatory Visit: Payer: Self-pay | Admitting: Cardiology

## 2016-11-06 ENCOUNTER — Telehealth: Payer: Self-pay | Admitting: Interventional Cardiology

## 2016-11-06 ENCOUNTER — Encounter: Payer: Self-pay | Admitting: Cardiology

## 2016-11-06 VITALS — BP 102/60 | HR 83 | Ht 62.5 in | Wt 130.8 lb

## 2016-11-06 DIAGNOSIS — I10 Essential (primary) hypertension: Secondary | ICD-10-CM | POA: Diagnosis not present

## 2016-11-06 DIAGNOSIS — R079 Chest pain, unspecified: Secondary | ICD-10-CM

## 2016-11-06 DIAGNOSIS — I959 Hypotension, unspecified: Secondary | ICD-10-CM | POA: Diagnosis not present

## 2016-11-06 DIAGNOSIS — R002 Palpitations: Secondary | ICD-10-CM

## 2016-11-06 DIAGNOSIS — R000 Tachycardia, unspecified: Secondary | ICD-10-CM

## 2016-11-06 DIAGNOSIS — I2510 Atherosclerotic heart disease of native coronary artery without angina pectoris: Secondary | ICD-10-CM | POA: Diagnosis not present

## 2016-11-06 NOTE — Progress Notes (Signed)
Cardiology Office Note   Date:  11/06/2016   ID:  [name], DOB 1951-03-03, MRN [id number]  PCP:  Leeroy Cha, MD  Cardiologist:  Dr. Tamala Julian     Chief Complaint  Patient presents with  . Tachycardia  . Dizziness      History of Present Illness: [name] is a 66 y.o. female who presents for dizziness and rapid HR.  She has a hx of diffuse CAD, and Cerebral vascular disease.  HLD, HTN, and anxiety disorder. Other hx of bladder cancer.  Last seen by Dr. Tamala Julian 03/25/16.     Last cath 2013 with diffuse CAD, echo in 2013 with normal EF and holter last year with rate premature beat. One brief SVT 6 beat salvo.  Today she called in to be seen for rapid HR.  She has also been hypotensive at times, down to 80 systolic. When she lies down it comes back up quickly.  No chest pain but some SOB.  She climbed stairs and she was SOB.  With her work outs she becomes SOB and has to stop working out.   No chest pain.       Past Medical History:  Diagnosis Date  . Anxiety    Psychiatry Dr. Stan Head, concerns for memory loss, anxiety  . Bladder tumor   . Cancer ([place])    bladder CA  . Chronic chest pain   . Coronary atherosclerosis CARDIOLOGIST-  DR Daneen Schick   Cath 11/2006 demonstrating moderate obstructive coronary disease with 70-80% septal perforator #1, 50-70% first diagonal, 80% third diagonal, 50% mid and distal LAD & heavy calcification throughout all 3 coronaries. LVF normal.  . Esophageal motility disorder   . Essential hypertension, benign   . Fatty liver 04/28/12  . GERD (gastroesophageal reflux disease)   . H/O esophageal spasm   . Heart murmur   . History of esophageal spasm    takes ranexa  . History of TIA (transient ischemic attack)    2011--  no residuals  . Hyperlipidemia   . IBS (irritable bowel syndrome)    Diarrhea predominent, esophageal spasm severe, GERD - Dr. Elicia Lamp  . Interstitial cystitis    Dr. Gaynelle Arabian  . Memory loss    Psychiatry Dr. Stan Head, concerns for memory loss, anxiety  . PONV (postoperative nausea and vomiting)    AND URINARY RETENTION  . Sensation of pressure in bladder area   . TMJ (temporomandibular joint syndrome)   . Wears glasses     Past Surgical History:  Procedure Laterality Date  . ABDOMINAL HYSTERECTOMY  AGE 54 -- 1989  . APPENDECTOMY  1971   AND OVARIAN CYSTECTOMY  . BENIGN EXCISIONAL BREAST BX  1996  . CARDIAC CATHETERIZATION  11-28-2006   DR Daneen Schick    moderate cad/  70-80% septal perforator #1, 50-70% first diagonal, 80% third diagonal, 50% mid and distal LAD & heavy calcification throughout all 3 coronaries. LVF normal.  . CARDIAC CATHETERIZATION  03-26-2012   DR Daneen Schick   patent CFX,  RCA,  & pLAD/  mLAD >50% to <70% a focal eccentric region that overlaps the third diagonal/  90% diagonal ostial and unchanged from prior study /   ef 65%  . CYSTO WITH HYDRODISTENSION N/A 01/14/2014   Procedure: CYSTOSCOPY/HYDRODISTENSION/INSERTION OF MARCAINE AND PYRIDUIM INTO BLADDER/INJECTION OF MARCAINE AND KENALOG into sub trigone space ;  Surgeon: Ailene Rud, MD;  Location[facility];  Service: Urology;  Laterality: N/A;  .  CYSTO/  HYDRODISTENTION/  INSTILLATION THERAPY  03-12-2010  . ESOPHAGOGASTRODUODENOSCOPY  04/30/2012   Procedure: ESOPHAGOGASTRODUODENOSCOPY (EGD);  Surgeon: Lafayette Dragon, MD;  Location: [facility];  Service: Endoscopy;  Laterality: N/A;  . LUMBAR [place]  . NEGATIVE SLEEP STUDY  2010  . SHOULDER ARTHROSCOPY Left 12-23-2011  . TRANSTHORACIC ECHOCARDIOGRAM  04-21-2012   GRADE I DIASTOLIC DYSFUNCTION/  EF 60-65%/  MILD MR  &  TR  . TRANSURETHRAL RESECTION OF BLADDER TUMOR Right 01/03/2015   Procedure: TRANSURETHRAL RESECTION OF BLADDER TUMOR (TURBT);  Surgeon: Carolan Clines, MD;  Location: [facility];  Service: Urology;  Laterality: Right;     Current Outpatient Prescriptions  Medication Sig  Dispense Refill  . acyclovir (ZOVIRAX) 400 MG tablet Take 400 mg by mouth 2 (two) times daily.    Marland Kitchen aspirin 81 MG tablet Take 81 mg by mouth at bedtime.     Marland Kitchen atorvastatin (LIPITOR) 20 MG tablet Take 20 mg by mouth at bedtime.     . calcium carbonate (TUMS - DOSED IN MG ELEMENTAL CALCIUM) 500 MG chewable tablet Chew 1 tablet by mouth as needed for indigestion or heartburn.    . Cholecalciferol (VITAMIN D) 1000 UNITS capsule Take 1,000 Units by mouth daily.    . clidinium-chlordiazePOXIDE (LIBRAX) 5-2.5 MG capsule Take 1 capsule by mouth daily as needed.     . Coenzyme Q10 (COQ10) 100 MG CAPS Take 100 mg by mouth daily.     . diazepam (VALIUM) 2 MG tablet Take 2 mg by mouth 2 (two) times daily as needed (bladder spasms).    . dicyclomine (BENTYL) 10 MG capsule Take 10 mg by mouth 3 (three) times daily as needed for spasms (IBS).    Marland Kitchen diltiazem 2 % GEL Apply 1 application topically as needed. For rectal spasm 30 g 1  . doxylamine, Sleep, (UNISOM) 25 MG tablet Take 37.5 mg by mouth at bedtime.     Marland Kitchen esomeprazole (NEXIUM) 40 MG packet Take 40 mg by mouth daily before breakfast. 30 each 6  . estradiol (CLIMARA - DOSED IN MG/24 HR) 0.025 mg/24hr patch Place 0.025 mg onto the skin once a week.    . fluticasone (FLONASE) 50 MCG/ACT nasal spray Place into both nostrils as needed for allergies or rhinitis.    Marland Kitchen ibuprofen (ADVIL,MOTRIN) 200 MG tablet Take 600 mg by mouth every 6 (six) hours as needed for headache or moderate pain.    Marland Kitchen linaclotide (LINZESS) 72 MCG capsule Take 72 mcg by mouth daily as needed (constipation).    Marland Kitchen loperamide (IMODIUM A-D) 2 MG tablet Take 2 mg by mouth as needed for diarrhea or loose stools.    Marland Kitchen LORazepam (ATIVAN) 0.5 MG tablet take 1 tablet by mouth three times a day if needed 30 tablet 0  . nitroGLYCERIN (NITROSTAT) 0.4 MG SL tablet place 1 tablet under the tongue if needed every 5 minutes for chest pain for 3 doses IF NO RELIEF AFTER 3RD DOSE CALL PRESCRIBER OR 911. 75  tablet 1  . oxyCODONE (OXY IR/ROXICODONE) 5 MG immediate release tablet Take 5 mg by mouth as needed for moderate pain.     Marland Kitchen PREMARIN vaginal cream Place 1 application vaginally 2 (two) times a week.    Marland Kitchen PRESCRIPTION MEDICATION BCG injection for bladder cancer given at [facility]    . Probiotic Product (PROBIOTIC ACIDOPHILUS) CAPS Take 1 capsule by mouth daily.    . ranolazine (RANEXA) 500 MG 12 hr tablet Take 1  tablet (500 mg total) by mouth 2 (two) times daily. 180 tablet 3  . temazepam (RESTORIL) 15 MG capsule Take 15 mg by mouth once a week. Does not take temazepam on a specific day     Current Facility-Administered Medications  Medication Dose Route Frequency Provider Last Rate Last Dose  . 0.9 %  sodium chloride infusion  500 mL Intravenous Continuous Nandigam, Venia Minks, MD        Allergies:   Clindamycin/lincomycin; Doxycycline; Escitalopram oxalate; Hydrocodone; Hyoscyamine; Macrodantin; Trazodone; Trazodone and nefazodone; Trimethoprim; Levsin [hyoscyamine sulfate]; Lincomycin; Nitrofurantoin; and Nitrofurantoin macrocrystal    Social History:  The patient  reports that she quit smoking about 43 years ago. Her smoking use included Cigarettes. She quit after 10.00 years of use. She has never used smokeless tobacco. She reports that she drinks about 1.2 oz of alcohol per week . She reports that she does not use drugs.   Family History:  The patient's family history includes Alzheimer's disease in her mother; CAD in her father and mother; Colon polyps in her mother; Dementia in her mother; Depression in her other; Diverticulosis in her father; Heart disease in her brother, father, and mother; Obesity in her other; Osteoarthritis in her other.    ROS:  General:no colds or fevers, no weight changes Skin:no rashes or ulcers HEENT:no blurred vision, no congestion CV:see HPI PUL:see HPI GI:no diarrhea constipation or melena, no indigestion GU:no hematuria, no dysuria MS:no joint pain, no  claudication Neuro:no syncope, no lightheadedness Endo:no diabetes, no thyroid disease Wt Readings from Last 3 Encounters:  11/06/16 130 lb 12.8 oz (59.3 kg)  03/25/16 131 lb (59.4 kg)  01/11/16 128 lb (58.1 kg)     PHYSICAL EXAM: VS:  BP 102/60   Pulse 83   Ht 5' 2.5" (1.588 m)   Wt 130 lb 12.8 oz (59.3 kg)   SpO2 96%   BMI 23.54 kg/m  , BMI Body mass index is 23.54 kg/m. General:Pleasant affect, NAD Skin:Warm and dry, brisk capillary refill HEENT:normocephalic, sclera clear, mucus membranes moist Neck:supple, no JVD, no bruits  Heart:S1S2 RRR without murmur, gallup, rub or click Lungs:clear without rales, rhonchi, or wheezes QAS:TMHD, non tender, + BS, do not palpate liver spleen or masses Ext:no lower ext edema, 2+ pedal pulses, 2+ radial pulses Neuro:alert and oriented X 3, MAE, follows commands, + facial symmetry    EKG:  EKG is ordered today. The ekg ordered today demonstrates SR normal EKG   Recent Labs: 05/28/2016: ALT 31    Lipid Panel    Component Value Date/Time   CHOL 195 05/28/2016 0809   TRIG 80 05/28/2016 0809   HDL 67 05/28/2016 0809   CHOLHDL 2.9 05/28/2016 0809   VLDL 16 05/28/2016 0809   LDLCALC 112 (H) 05/28/2016 0809       Other studies Reviewed: Additional studies/ records that were reviewed today include: . Cath 2013   ANGIOGRAPHIC DATA:   The left main coronary artery is very short, heavily calcified, but widely patent..  The left anterior descending artery is heavy calcification with 30-40% narrowing in the proximal, and eccentric 50-60% stenosis in the mid vessel. A large first diagonal contains proximal/ostial 50% narrowing the third diagonal contains 90% narrowing the second diagonal contains no significant obstruction. The LAD wraps around the left ventricular apex. The first septal perforator contains 80% ostial narrowing.  The left circumflex artery is nondominant and widely patent. There is less than 50% ostial narrowing  likely do to catheter induced spasm.Marland Kitchen  The right coronary artery is segmental less than 40% narrowing in the proximal one third of the vessel. The RCA is dominant.Marland Kitchen  LEFT VENTRICULOGRAM:  Left ventricular angiogram was done in the 30 RAO projection and revealed normal left ventricular wall motion and systolic function with an estimated ejection fraction of 65 %.  IMPRESSIONS:  1. Widely patent circumflex, RCA, and proximal LAD. The mid LAD contains greater than 50% stenosis to less than 70 a focal eccentric region that overlaps the origin of the third diagonal. The diagonal contains a 90% ostial narrowing and is unchanged from the prior study.  2. Normal left ventricular function   RECOMMENDATION:  Continue medical therapy. Consider alternative sources of chest discomfort..   2013 Echo: Study Conclusions  - Left ventricle: The cavity size was normal. Systolic function was normal. The estimated ejection fraction was in the range of 60% to 65%. Wall motion was normal; there were no regional wall motion abnormalities. Doppler parameters are consistent with abnormal left ventricular relaxation (grade 1 diastolic dysfunction). - Mitral valve: Mild regurgitation.   holter 08/2015 with rare premature beats.  SR    ASSESSMENT AND PLAN:  1.  Tachycardia she did have 6 beats of SVT on last holter.  Concern for longer episodes with this she does develop hypotension. Check labs today thyroid, CBC, BMP. Mg. whe will wear 48 hour holter. Follow up with me on day Dr. Tamala Julian is in the office.   2.  CAD with freq episodes of tachycardia will do POET to eval for ischemia and with exertion to see if HR increased  3. DOE at times will doe [place].  4. HTN but now hypotensive with no meds.     Current medicines are reviewed with the patient today.  The patient Has no concerns regarding medicines.  The following changes have been made:  See above Labs/ tests ordered today  include:see above  Disposition:   FU:  see above  Signed, Cecilie Kicks, NP  11/06/2016 2:35 PM    [facility] [place], [place], [place], [place] Phone: [phone]; Fax: [phone]

## 2016-11-06 NOTE — Telephone Encounter (Signed)
[name] is calling because she is experiencing  some low blood pressure, heart racing, dizziness . Please call .Marland Kitchen Thanks

## 2016-11-06 NOTE — Patient Instructions (Signed)
Medication Instructions:  Your physician recommends that you continue on your current medications as directed. Please refer to the Current Medication list given to you today.   Labwork: Your physician recommends that you return for lab work today for BMET, CBC, TSH, FREE T4, MAGNESIUM  Testing/Procedures: Exercise Tolerance Test   Your physician has recommended that you wear a holter monitor. Holter monitors are medical devices that record the heart's electrical activity. Doctors most often use these monitors to diagnose arrhythmias. Arrhythmias are problems with the speed or rhythm of the heartbeat. The monitor is a small, portable device. You can wear one while you do your normal daily activities. This is usually used to diagnose what is causing palpitations/syncope (passing out).         Follow-Up: Your physician recommends that you schedule a follow-up appointment in: after test with Dr. Tamala Julian    Any Other Special Instructions Will Be Listed Below (If Applicable).     If you need a refill on your cardiac medications before your next appointment, please call your pharmacy.  Thank you for choosing [facility]

## 2016-11-06 NOTE — Telephone Encounter (Signed)
Spoke with pt and she states that for two days she has been feeling her heart race and has been lightheaded.  States 2 days ago BP was 107/60's.  No problems yesterday.  Feelings came back today so she checked BP and it was 88/67.  Pt laid down for a bit and then BP improved to 137/80's.  States once she sat up BP dropped back down to 107/??.  States when she goes upstairs, by the time she gets to the top her heart is racing and she has to stop for a minute to rest.  Denies SOB or CP.  Recently started on Belsomra 10mg  QHS for sleep.  Did not take this medication last night.  Pt requesting to be seen.  Scheduled pt to see Cecilie Kicks, NP today at 2pm.  Pt appreciative for assistance.

## 2016-11-07 LAB — CBC
Hematocrit: 37.8 % (ref 34.0–46.6)
Hemoglobin: 12.1 g/dL (ref 11.1–15.9)
MCH: 28.6 pg (ref 26.6–33.0)
MCHC: 32 g/dL (ref 31.5–35.7)
MCV: 89 fL (ref 79–97)
Platelets: 281 10*3/uL (ref 150–379)
RBC: 4.23 x10E6/uL (ref 3.77–5.28)
RDW: 14.8 % (ref 12.3–15.4)
WBC: 4.8 10*3/uL (ref 3.4–10.8)

## 2016-11-07 LAB — BASIC METABOLIC PANEL
BUN/Creatinine Ratio: 13 (ref 12–28)
BUN: 11 mg/dL (ref 8–27)
CO2: 26 mmol/L (ref 18–29)
Calcium: 9.2 mg/dL (ref 8.7–10.3)
Chloride: 100 mmol/L (ref 96–106)
Creatinine, Ser: 0.86 mg/dL (ref 0.57–1.00)
GFR calc Af Amer: 82 mL/min/{1.73_m2} (ref >59)
GFR calc non Af Amer: 71 mL/min/{1.73_m2} (ref >59)
Glucose: 87 mg/dL (ref 65–99)
Potassium: 4.5 mmol/L (ref 3.5–5.2)
Sodium: 141 mmol/L (ref 134–144)

## 2016-11-07 LAB — MAGNESIUM: Magnesium: 2.1 mg/dL (ref 1.6–2.3)

## 2016-11-07 LAB — TSH: TSH: 1.72 u[IU]/mL (ref 0.450–4.500)

## 2016-11-07 LAB — T4, FREE: Free T4: 1.02 ng/dL (ref 0.82–1.77)

## 2016-11-12 ENCOUNTER — Ambulatory Visit (INDEPENDENT_AMBULATORY_CARE_PROVIDER_SITE_OTHER): Payer: Medicare Other

## 2016-11-12 DIAGNOSIS — R079 Chest pain, unspecified: Secondary | ICD-10-CM | POA: Diagnosis not present

## 2016-11-12 LAB — EXERCISE TOLERANCE TEST
Estimated workload: 8.8 METS
Exercise duration (min): 7 min
Exercise duration (sec): 12 s
MPHR: 155 {beats}/min
Peak HR: 146 {beats}/min
Percent HR: 94 %
RPE: 17
Rest HR: 72 {beats}/min

## 2016-11-14 DIAGNOSIS — C679 Malignant neoplasm of bladder, unspecified: Secondary | ICD-10-CM | POA: Diagnosis not present

## 2016-11-16 ENCOUNTER — Encounter (HOSPITAL_BASED_OUTPATIENT_CLINIC_OR_DEPARTMENT_OTHER): Payer: Self-pay

## 2016-11-16 ENCOUNTER — Emergency Department (HOSPITAL_BASED_OUTPATIENT_CLINIC_OR_DEPARTMENT_OTHER)
Admission: EM | Admit: 2016-11-16 | Discharge: 2016-11-16 | Disposition: A | Discharge status: Home / Self Care | Payer: Medicare Other | Attending: Emergency Medicine | Admitting: Emergency Medicine

## 2016-11-16 DIAGNOSIS — Z87891 Personal history of nicotine dependence: Secondary | ICD-10-CM | POA: Diagnosis not present

## 2016-11-16 DIAGNOSIS — N3000 Acute cystitis without hematuria: Secondary | ICD-10-CM

## 2016-11-16 DIAGNOSIS — I10 Essential (primary) hypertension: Secondary | ICD-10-CM | POA: Insufficient documentation

## 2016-11-16 DIAGNOSIS — Z79899 Other long term (current) drug therapy: Secondary | ICD-10-CM | POA: Diagnosis not present

## 2016-11-16 DIAGNOSIS — R300 Dysuria: Secondary | ICD-10-CM | POA: Diagnosis present

## 2016-11-16 LAB — CBC
HCT: 34.2 % — ABNORMAL LOW (ref 36.0–46.0)
Hemoglobin: 11.4 g/dL — ABNORMAL LOW (ref 12.0–15.0)
MCH: 29.4 pg (ref 26.0–34.0)
MCHC: 33.3 g/dL (ref 30.0–36.0)
MCV: 88.1 fL (ref 78.0–100.0)
Platelets: 187 10*3/uL (ref 150–400)
RBC: 3.88 MIL/uL (ref 3.87–5.11)
RDW: 13.2 % (ref 11.5–15.5)
WBC: 6.9 10*3/uL (ref 4.0–10.5)

## 2016-11-16 LAB — BASIC METABOLIC PANEL
Anion gap: 8 (ref 5–15)
BUN: 8 mg/dL (ref 6–20)
CO2: 26 mmol/L (ref 22–32)
Calcium: 8.7 mg/dL — ABNORMAL LOW (ref 8.9–10.3)
Chloride: 100 mmol/L — ABNORMAL LOW (ref 101–111)
Creatinine, Ser: 0.72 mg/dL (ref 0.44–1.00)
GFR calc Af Amer: >60 mL/min (ref >60)
GFR calc non Af Amer: >60 mL/min (ref >60)
Glucose, Bld: 134 mg/dL — ABNORMAL HIGH (ref 65–99)
Potassium: 3.3 mmol/L — ABNORMAL LOW (ref 3.5–5.1)
Sodium: 134 mmol/L — ABNORMAL LOW (ref 135–145)

## 2016-11-16 LAB — URINALYSIS, MICROSCOPIC (REFLEX)

## 2016-11-16 LAB — URINALYSIS, ROUTINE W REFLEX MICROSCOPIC
Bilirubin Urine: NEGATIVE
Glucose, UA: NEGATIVE mg/dL
Ketones, ur: NEGATIVE mg/dL
Nitrite: POSITIVE — AB
Protein, ur: 30 mg/dL — AB
Specific Gravity, Urine: 1.01 (ref 1.005–1.030)
pH: 6.5 (ref 5.0–8.0)

## 2016-11-16 MED ORDER — SODIUM CHLORIDE 0.9 % IV SOLN
INTRAVENOUS | Status: DC
  Administered 2016-11-16: 09:00:00 via INTRAVENOUS

## 2016-11-16 MED ORDER — CEPHALEXIN 500 MG PO CAPS: 500.0000 mg | ORAL_CAPSULE | Freq: Three times a day (TID) | ORAL | 0 refills | Status: DC

## 2016-11-16 MED ORDER — DEXTROSE 5 % IV SOLN
1.0000 g | Freq: Once | INTRAVENOUS | Status: AC
  Administered 2016-11-16: 1 g via INTRAVENOUS
  Filled 2016-11-16: qty 10

## 2016-11-16 NOTE — ED Provider Notes (Signed)
[facility] Provider Note   CSN: [id number] Arrival date & time: 11/16/16  3151     History   Chief Complaint Chief Complaint  Patient presents with  . Dysuria    HPI [name] is a 66 y.o. female.  HPI Patient presents to the emergency room with complaints of back pain and dysuria. Patient has history of bladder cancer. She had a routine surveillance cystoscopy performed on Thursday. Friday she started developing some lower back discomfort. As the day progressed she started having some discomfort with urination. Early this morning she had severe pain in the right flank area and started having more severe pain with urination. Patient felt her symptoms could possibly be related to an infection or some type of ureteral obstruction. She felt feverish and had a temperature up to 101. She has never had any kidney stones but she has had prior ureteral obstructions associated with complications from her bladder cancer. Past Medical History:  Diagnosis Date  . Anxiety    Psychiatry Dr. Stan Head, concerns for memory loss, anxiety  . Bladder tumor   . Cancer ([place])    bladder CA  . Chronic chest pain   . Coronary atherosclerosis CARDIOLOGIST-  DR Daneen Schick   Cath 11/2006 demonstrating moderate obstructive coronary disease with 70-80% septal perforator #1, 50-70% first diagonal, 80% third diagonal, 50% mid and distal LAD & heavy calcification throughout all 3 coronaries. LVF normal.  . Esophageal motility disorder   . Essential hypertension, benign   . Fatty liver 04/28/12  . GERD (gastroesophageal reflux disease)   . H/O esophageal spasm   . Heart murmur   . History of esophageal spasm    takes ranexa  . History of TIA (transient ischemic attack)    2011--  no residuals  . Hyperlipidemia   . IBS (irritable bowel syndrome)    Diarrhea predominent, esophageal spasm severe, GERD - Dr. Elicia Lamp  . Interstitial cystitis    Dr. Gaynelle Arabian  . Memory loss    Psychiatry Dr. Stan Head, concerns for memory loss, anxiety  . PONV (postoperative nausea and vomiting)    AND URINARY RETENTION  . Sensation of pressure in bladder area   . TMJ (temporomandibular joint syndrome)   . Wears glasses     Patient Active Problem List   Diagnosis Date Noted  . Rectal spasm 04/11/2015  . Rectal bleeding 03/23/2013  . Reflux esophagitis 04/30/2012  . Other dysphagia 04/30/2012  . Syncope 04/20/2012  . Cerebral artery occlusion with cerebral infarction ([place]) 01/17/2010  . FREQUENCY, URINARY 01/17/2010  . Hyperlipidemia 08/05/2007  . Essential hypertension 08/05/2007  . Coronary artery disease involving native heart 08/05/2007  . Irritable bowel syndrome 08/05/2007  . PROCTALGIA FUGAX 08/05/2007    Past Surgical History:  Procedure Laterality Date  . ABDOMINAL HYSTERECTOMY  AGE 50 -- 1989  . APPENDECTOMY  1971   AND OVARIAN CYSTECTOMY  . BENIGN EXCISIONAL BREAST BX  1996  . CARDIAC CATHETERIZATION  11-28-2006   DR Daneen Schick    moderate cad/  70-80% septal perforator #1, 50-70% first diagonal, 80% third diagonal, 50% mid and distal LAD & heavy calcification throughout all 3 coronaries. LVF normal.  . CARDIAC CATHETERIZATION  03-26-2012   DR Daneen Schick   patent CFX,  RCA,  & pLAD/  mLAD >50% to <70% a focal eccentric region that overlaps the third diagonal/  90% diagonal ostial and unchanged from prior study /   ef 65%  . CYSTO WITH  HYDRODISTENSION N/A 01/14/2014   Procedure: CYSTOSCOPY/HYDRODISTENSION/INSERTION OF MARCAINE AND PYRIDUIM INTO BLADDER/INJECTION OF MARCAINE AND KENALOG into sub trigone space ;  Surgeon: Ailene Rud, MD;  Location[facility];  Service: Urology;  Laterality: N/A;  . CYSTO/  HYDRODISTENTION/  INSTILLATION THERAPY  03-12-2010  . ESOPHAGOGASTRODUODENOSCOPY  04/30/2012   Procedure: ESOPHAGOGASTRODUODENOSCOPY (EGD);  Surgeon: Lafayette Dragon, MD;  Location: [facility];  Service: Endoscopy;  Laterality:  N/A;  . LUMBAR [place]  . NEGATIVE SLEEP STUDY  2010  . SHOULDER ARTHROSCOPY Left 12-23-2011  . TRANSTHORACIC ECHOCARDIOGRAM  04-21-2012   GRADE I DIASTOLIC DYSFUNCTION/  EF 60-65%/  MILD MR  &  TR  . TRANSURETHRAL RESECTION OF BLADDER TUMOR Right 01/03/2015   Procedure: TRANSURETHRAL RESECTION OF BLADDER TUMOR (TURBT);  Surgeon: Carolan Clines, MD;  Location: [facility];  Service: Urology;  Laterality: Right;    OB History    No data available       Home Medications    Prior to Admission medications   Medication Sig Start Date End Date Taking? Authorizing Provider  acyclovir (ZOVIRAX) 400 MG tablet Take 400 mg by mouth 2 (two) times daily.   Yes [provider]  aspirin 81 MG tablet Take 81 mg by mouth at bedtime.    Yes [provider]  atorvastatin (LIPITOR) 20 MG tablet Take 20 mg by mouth at bedtime.    Yes [provider]  calcium carbonate (TUMS - DOSED IN MG ELEMENTAL CALCIUM) 500 MG chewable tablet Chew 1 tablet by mouth as needed for indigestion or heartburn.   Yes [provider]  Cholecalciferol (VITAMIN D) 1000 UNITS capsule Take 1,000 Units by mouth daily.   Yes [provider]  clidinium-chlordiazePOXIDE (LIBRAX) 5-2.5 MG capsule Take 1 capsule by mouth daily as needed.    Yes [provider]  diazepam (VALIUM) 2 MG tablet Take 2 mg by mouth 2 (two) times daily as needed (bladder spasms).   Yes [provider]  dicyclomine (BENTYL) 10 MG capsule Take 10 mg by mouth 3 (three) times daily as needed for spasms (IBS).   Yes [provider]  diltiazem 2 % GEL Apply 1 application topically as needed. For rectal spasm 04/06/15  Yes Zehr, Laban Emperor, PA-C  doxylamine, Sleep, (UNISOM) 25 MG tablet Take 37.5 mg by mouth at bedtime.    Yes [provider]  esomeprazole (NEXIUM) 40 MG packet Take 40 mg by mouth daily before breakfast. 06/09/15  Yes Nandigam, Venia Minks, MD    estradiol (CLIMARA - DOSED IN MG/24 HR) 0.025 mg/24hr patch Place 0.025 mg onto the skin once a week.   Yes [provider]  fluticasone (FLONASE) 50 MCG/ACT nasal spray Place into both nostrils as needed for allergies or rhinitis.   Yes [provider]  ibuprofen (ADVIL,MOTRIN) 200 MG tablet Take 600 mg by mouth every 6 (six) hours as needed for headache or moderate pain.   Yes [provider]  loperamide (IMODIUM A-D) 2 MG tablet Take 2 mg by mouth as needed for diarrhea or loose stools.   Yes [provider]  LORazepam (ATIVAN) 0.5 MG tablet take 1 tablet by mouth three times a day if needed 08/27/16  Yes Zehr, Janett Billow D, PA-C  nitroGLYCERIN (NITROSTAT) 0.4 MG SL tablet place 1 tablet under the tongue if needed every 5 minutes for chest pain for 3 doses IF NO RELIEF AFTER 3RD DOSE CALL PRESCRIBER OR 911. 09/29/15  Yes Belva Crome, MD  oxyCODONE (OXY IR/ROXICODONE) 5 MG immediate release tablet Take 5 mg by mouth as needed for moderate pain.  01/26/15  Yes [provider]  PREMARIN vaginal cream Place 1 application vaginally 2 (two) times a week. 04/05/15  Yes [provider]  PRESCRIPTION MEDICATION BCG injection for bladder cancer given at [facility]   Yes [provider]  Probiotic Product (PROBIOTIC ACIDOPHILUS) CAPS Take 1 capsule by mouth daily.   Yes [provider]  ranolazine (RANEXA) 500 MG 12 hr tablet Take 1 tablet (500 mg total) by mouth 2 (two) times daily. 04/12/16  Yes Belva Crome, MD  temazepam (RESTORIL) 15 MG capsule Take 15 mg by mouth once a week. Does not take temazepam on a specific day 02/11/12  Yes [provider]  cephALEXin (KEFLEX) 500 MG capsule Take 1 capsule (500 mg total) by mouth 3 (three) times daily. 11/16/16   Dorie Rank, MD  Coenzyme Q10 (COQ10) 100 MG CAPS Take 100 mg by mouth daily.     [provider]  linaclotide (LINZESS) 72 MCG capsule Take 72 mcg by mouth daily as needed  (constipation).    [provider]    Family History Family History  Problem Relation Age of Onset  . Heart disease Mother   . Colon polyps Mother   . Alzheimer's disease Mother   . Dementia Mother   . CAD Mother   . Heart disease Father   . Diverticulosis Father   . CAD Father        in his 32s  . Heart disease Brother   . Osteoarthritis Other        Multiple family members  . Obesity Other        Multiple family members  . Depression Other        Multiple family members  . Colon cancer Neg Hx     Social History Social History  Substance Use Topics  . Smoking status: Former Smoker    Years: 10.00    Types: Cigarettes    Quit date: 06/10/1973  . Smokeless tobacco: Never Used  . Alcohol use 1.2 oz/week    2 Glasses of wine per week     Comment: social     Allergies   Clindamycin/lincomycin; Doxycycline; Escitalopram oxalate; Hydrocodone; Hyoscyamine; Macrodantin; Trazodone; Trazodone and nefazodone; Trimethoprim; Levsin [hyoscyamine sulfate]; Lincomycin; Nitrofurantoin; and Nitrofurantoin macrocrystal   Review of Systems Review of Systems  All other systems reviewed and are negative.    Physical Exam Updated Vital Signs BP (!) 117/54 (BP Location: Left Arm)   Pulse 73   Temp 98.9 F (37.2 C) (Oral)   Resp 18   Ht 1.588 m (5' 2.5")   Wt 57.6 kg (127 lb)   SpO2 99%   BMI 22.86 kg/m   Physical Exam  Constitutional: No distress.  HENT:  Head: Normocephalic and atraumatic.  Right Ear: External ear normal.  Left Ear: External ear normal.  Mouth/Throat: No oropharyngeal exudate.  Eyes: Conjunctivae are normal. Right eye exhibits no discharge. Left eye exhibits no discharge. No scleral icterus.  Neck: Neck supple. No tracheal deviation present.  Cardiovascular: Normal rate, regular rhythm and intact distal pulses.   Pulmonary/Chest: Effort normal and breath sounds normal. No stridor. No respiratory distress. She has no wheezes. She has no rales.    Abdominal: Soft. Bowel sounds are normal. She exhibits no distension. There is no tenderness. There is no rebound and no guarding.  Musculoskeletal:  She exhibits no edema or tenderness.  Neurological: She is alert. She has normal strength. No cranial nerve deficit (no facial droop, extraocular movements intact, no slurred speech) or sensory deficit. She exhibits normal muscle tone. She displays no seizure activity. Coordination normal.  Skin: Skin is warm and dry. No rash noted. She is not diaphoretic.  Psychiatric: She has a normal mood and affect.  Nursing note and vitals reviewed.    ED Treatments / Results  Labs (all labs ordered are listed, but only abnormal results are displayed) Labs Reviewed  URINALYSIS, ROUTINE W REFLEX MICROSCOPIC - Abnormal; Notable for the following:       Result Value   Color, Urine ORANGE (*)    APPearance CLOUDY (*)    Hgb urine dipstick TRACE (*)    Protein, ur 30 (*)    Nitrite POSITIVE (*)    Leukocytes, UA LARGE (*)    All other components within normal limits  CBC - Abnormal; Notable for the following:    Hemoglobin 11.4 (*)    HCT 34.2 (*)    All other components within normal limits  BASIC METABOLIC PANEL - Abnormal; Notable for the following:    Sodium 134 (*)    Potassium 3.3 (*)    Chloride 100 (*)    Glucose, Bld 134 (*)    Calcium 8.7 (*)    All other components within normal limits  URINALYSIS, MICROSCOPIC (REFLEX) - Abnormal; Notable for the following:    Bacteria, UA MANY (*)    Squamous Epithelial / LPF 0-5 (*)    All other components within normal limits  URINE CULTURE     Procedures Procedures (including critical care time)  Medications Ordered in ED Medications  0.9 %  sodium chloride infusion ( Intravenous New Bag/Given 11/16/16 0922)  cefTRIAXone (ROCEPHIN) 1 g in dextrose 5 % 50 mL IVPB (1 g Intravenous New Bag/Given 11/16/16 0959)     Initial Impression / Assessment and Plan / ED Course  I have reviewed the  triage vital signs and the nursing notes.  Pertinent labs & imaging results that were available during my care of the patient were reviewed by me and considered in my medical decision making (see chart for details).   Pt presents with flank pain and dysuria.  UA is suggestive of UTI.  Labs are normal.   Doubt pyelo but because she felt feverish given rocephin and will treat with IV fluids.  Doubt kidney stone.  Will dc home on oral abx  Final Clinical Impressions(s) / ED Diagnoses   Final diagnoses:  Acute cystitis without hematuria    New Prescriptions New Prescriptions   CEPHALEXIN (KEFLEX) 500 MG CAPSULE    Take 1 capsule (500 mg total) by mouth 3 (three) times daily.     Dorie Rank, MD 11/16/16 1023

## 2016-11-16 NOTE — Discharge Instructions (Signed)
Physical exam Amoxil as prescribed. Follow-up with your doctor to make sure the infection resolves. Return to an emergency room for trouble with fever, vomiting, worsening symptoms

## 2016-11-16 NOTE — ED Triage Notes (Signed)
Pt reports cystoscopy on Thursday, Friday developed lower back discomfort, dysuria, concerned about possible infection to right kidney and bladder. Associated nausea.

## 2016-11-18 LAB — URINE CULTURE: Culture: >=100000 — AB

## 2016-11-19 ENCOUNTER — Telehealth: Payer: Self-pay | Admitting: Emergency Medicine

## 2016-11-19 DIAGNOSIS — M961 Postlaminectomy syndrome, not elsewhere classified: Secondary | ICD-10-CM | POA: Diagnosis not present

## 2016-11-19 DIAGNOSIS — M5136 Other intervertebral disc degeneration, lumbar region: Secondary | ICD-10-CM | POA: Diagnosis not present

## 2016-11-19 NOTE — Telephone Encounter (Signed)
Post ED Visit - Positive Culture Follow-up  Culture report reviewed by antimicrobial stewardship pharmacist:  []  Elenor Quinones, Pharm.D. []  Heide Guile, Pharm.D., BCPS AQ-ID []  Parks Neptune, Pharm.D., BCPS [x]  Alycia Rossetti, Pharm.D., BCPS []  [place], Pharm.D., BCPS, AAHIVP []  Legrand Como, Pharm.D., BCPS, AAHIVP []  Salome Arnt, PharmD, BCPS []  Dimitri Ped, PharmD, BCPS []  Vincenza Hews, PharmD, BCPS  Positive urine culture Treated with cephalexin, organism sensitive to the same and no further patient follow-up is required at this time.  Hazle Nordmann 11/19/2016, 10:54 AM

## 2016-11-21 NOTE — Progress Notes (Signed)
Cardiology Office Note   Date:  11/22/2016   ID:  [name], DOB September 05, 1950, MRN [id number]  PCP:  Leeroy Cha, MD  Cardiologist:  Dr. Tamala Julian     Chief Complaint  Patient presents with  . Dizziness      History of Present Illness: [name] is a 66 y.o. female who presents for results of test after presentation for dizziness and rapid HR.    She has a hx of diffuse CAD, and Cerebral vascular disease.  HLD, HTN, and anxiety disorder. Other hx of bladder cancer.  Last seen by Dr. Tamala Julian 03/25/16.     Last cath 2013 with diffuse CAD, echo in 2013 with normal EF and holter last year with rate premature beat. One brief SVT 6 beat salvo.  Pt has worn 48 hour holter with no arrhthymias and with DOE and hx of CAD she had POET without ischemia.  On last visit with hx of HTN but with visit hypotension.  With POET  BP pk BP 165/77 and pulse to 146.  8.8 METS.  No chest pain.    Today she has no complaints but worried her BP is 100 today.  We discussed importance of increasing fluids. She was reassured about symptoms.  TSH 1.720,  CBC was normal along with BMP see below.  Since out visit she has had UTI and is under treatment.  Her daughter has also had issues with a neuro problem.   She is concerned about her decrease in stamina.  She has had this problem for some time.  She never had the really rapid HR on the monitor.   Past Medical History:  Diagnosis Date  . Anxiety    Psychiatry Dr. Stan Head, concerns for memory loss, anxiety  . Bladder tumor   . Cancer ([place])    bladder CA  . Chronic chest pain   . Coronary atherosclerosis CARDIOLOGIST-  DR Daneen Schick   Cath 11/2006 demonstrating moderate obstructive coronary disease with 70-80% septal perforator #1, 50-70% first diagonal, 80% third diagonal, 50% mid and distal LAD & heavy calcification throughout all 3 coronaries. LVF normal.  . Esophageal motility disorder   . Essential hypertension, benign   . Fatty  liver 04/28/12  . GERD (gastroesophageal reflux disease)   . H/O esophageal spasm   . Heart murmur   . History of esophageal spasm    takes ranexa  . History of TIA (transient ischemic attack)    2011--  no residuals  . Hyperlipidemia   . IBS (irritable bowel syndrome)    Diarrhea predominent, esophageal spasm severe, GERD - Dr. Elicia Lamp  . Interstitial cystitis    Dr. Gaynelle Arabian  . Memory loss    Psychiatry Dr. Stan Head, concerns for memory loss, anxiety  . PONV (postoperative nausea and vomiting)    AND URINARY RETENTION  . Sensation of pressure in bladder area   . TMJ (temporomandibular joint syndrome)   . Wears glasses     Past Surgical History:  Procedure Laterality Date  . ABDOMINAL HYSTERECTOMY  AGE 29 -- 1989  . APPENDECTOMY  1971   AND OVARIAN CYSTECTOMY  . BENIGN EXCISIONAL BREAST BX  1996  . CARDIAC CATHETERIZATION  11-28-2006   DR Daneen Schick    moderate cad/  70-80% septal perforator #1, 50-70% first diagonal, 80% third diagonal, 50% mid and distal LAD & heavy calcification throughout all 3 coronaries. LVF normal.  . CARDIAC CATHETERIZATION  03-26-2012   DR Daneen Schick  patent CFX,  RCA,  & pLAD/  mLAD >50% to <70% a focal eccentric region that overlaps the third diagonal/  90% diagonal ostial and unchanged from prior study /   ef 65%  . CYSTO WITH HYDRODISTENSION N/A 01/14/2014   Procedure: CYSTOSCOPY/HYDRODISTENSION/INSERTION OF MARCAINE AND PYRIDUIM INTO BLADDER/INJECTION OF MARCAINE AND KENALOG into sub trigone space ;  Surgeon: Ailene Rud, MD;  Location: [facility];  Service: Urology;  Laterality: N/A;  . CYSTO/  HYDRODISTENTION/  INSTILLATION THERAPY  03-12-2010  . ESOPHAGOGASTRODUODENOSCOPY  04/30/2012   Procedure: ESOPHAGOGASTRODUODENOSCOPY (EGD);  Surgeon: Lafayette Dragon, MD;  Location: [facility];  Service: Endoscopy;  Laterality: N/A;  . LUMBAR [place]  . NEGATIVE SLEEP STUDY  2010  . SHOULDER ARTHROSCOPY Left  12-23-2011  . TRANSTHORACIC ECHOCARDIOGRAM  04-21-2012   GRADE I DIASTOLIC DYSFUNCTION/  EF 60-65%/  MILD MR  &  TR  . TRANSURETHRAL RESECTION OF BLADDER TUMOR Right 01/03/2015   Procedure: TRANSURETHRAL RESECTION OF BLADDER TUMOR (TURBT);  Surgeon: Carolan Clines, MD;  Location: [facility];  Service: Urology;  Laterality: Right;     Current Outpatient Prescriptions  Medication Sig Dispense Refill  . acyclovir (ZOVIRAX) 400 MG tablet Take 400 mg by mouth 2 (two) times daily.    Marland Kitchen aspirin 81 MG tablet Take 81 mg by mouth at bedtime.     Marland Kitchen atorvastatin (LIPITOR) 20 MG tablet Take 20 mg by mouth at bedtime.     . calcium carbonate (TUMS - DOSED IN MG ELEMENTAL CALCIUM) 500 MG chewable tablet Chew 1 tablet by mouth as needed for indigestion or heartburn.    . cephALEXin (KEFLEX) 500 MG capsule Take 1 capsule (500 mg total) by mouth 3 (three) times daily. 21 capsule 0  . Cholecalciferol (VITAMIN D) 1000 UNITS capsule Take 1,000 Units by mouth daily.    . clidinium-chlordiazePOXIDE (LIBRAX) 5-2.5 MG capsule Take 1 capsule by mouth daily as needed.     . Coenzyme Q10 (COQ10) 100 MG CAPS Take 100 mg by mouth daily.     . diazepam (VALIUM) 2 MG tablet Take 2 mg by mouth 2 (two) times daily as needed (bladder spasms).    . dicyclomine (BENTYL) 10 MG capsule Take 10 mg by mouth 3 (three) times daily as needed for spasms (IBS).    Marland Kitchen diltiazem 2 % GEL Apply 1 application topically as needed. For rectal spasm 30 g 1  . doxylamine, Sleep, (UNISOM) 25 MG tablet Take 37.5 mg by mouth at bedtime.     Marland Kitchen esomeprazole (NEXIUM) 40 MG packet Take 40 mg by mouth daily before breakfast. 30 each 6  . estradiol (CLIMARA - DOSED IN MG/24 HR) 0.025 mg/24hr patch Place 0.025 mg onto the skin once a week.    . fluticasone (FLONASE) 50 MCG/ACT nasal spray Place into both nostrils as needed for allergies or rhinitis.    Marland Kitchen ibuprofen (ADVIL,MOTRIN) 200 MG tablet Take 600 mg by mouth every 6 (six) hours as  needed for headache or moderate pain.    Marland Kitchen linaclotide (LINZESS) 72 MCG capsule Take 72 mcg by mouth daily as needed (constipation).    Marland Kitchen loperamide (IMODIUM A-D) 2 MG tablet Take 2 mg by mouth as needed for diarrhea or loose stools.    Marland Kitchen LORazepam (ATIVAN) 0.5 MG tablet take 1 tablet by mouth three times a day if needed 30 tablet 0  . nitroGLYCERIN (NITROSTAT) 0.4 MG SL tablet place 1 tablet under the tongue  if needed every 5 minutes for chest pain for 3 doses IF NO RELIEF AFTER 3RD DOSE CALL PRESCRIBER OR 911. 75 tablet 1  . oxyCODONE (OXY IR/ROXICODONE) 5 MG immediate release tablet Take 5 mg by mouth as needed for moderate pain.     Marland Kitchen PREMARIN vaginal cream Place 1 application vaginally 2 (two) times a week.    Marland Kitchen PRESCRIPTION MEDICATION BCG injection for bladder cancer given at [place]    . Probiotic Product (PROBIOTIC ACIDOPHILUS) CAPS Take 1 capsule by mouth daily.    . ranolazine (RANEXA) 500 MG 12 hr tablet Take 1 tablet (500 mg total) by mouth 2 (two) times daily. 180 tablet 3  . temazepam (RESTORIL) 15 MG capsule Take 15 mg by mouth once a week. Does not take temazepam on a specific day     Current Facility-Administered Medications  Medication Dose Route Frequency Provider Last Rate Last Dose  . 0.9 %  sodium chloride infusion  500 mL Intravenous Continuous Nandigam, Venia Minks, MD        Allergies:   Clindamycin/lincomycin; Doxycycline; Escitalopram oxalate; Hydrocodone; Hyoscyamine; Macrodantin; Trazodone; Trazodone and nefazodone; Trimethoprim; Levsin [hyoscyamine sulfate]; Lincomycin; Nitrofurantoin; and Nitrofurantoin macrocrystal    Social History:  The patient  reports that she quit smoking about 43 years ago. Her smoking use included Cigarettes. She quit after 10.00 years of use. She has never used smokeless tobacco. She reports that she drinks about 1.2 oz of alcohol per week . She reports that she does not use drugs.   Family History:  The patient's family history includes  Alzheimer's disease in her mother; CAD in her father and mother; Colon polyps in her mother; Dementia in her mother; Depression in her other; Diverticulosis in her father; Heart disease in her brother, father, and mother; Obesity in her other; Osteoarthritis in her other.    ROS:  General:no colds or fevers, no weight changes Skin:no rashes or ulcers HEENT:no blurred vision, no congestion CV:see HPI PUL:see HPI GI:no diarrhea constipation or melena, no indigestion GU:no hematuria, + dysuria, + UTI on Keflex MS:no joint pain, no claudication Neuro:no syncope, occ lightheadedness Endo:no diabetes, no thyroid disease  Wt Readings from Last 3 Encounters:  11/22/16 130 lb 12.8 oz (59.3 kg)  11/16/16 127 lb (57.6 kg)  11/06/16 130 lb 12.8 oz (59.3 kg)     PHYSICAL EXAM: VS:  BP 100/60   Pulse 75   Ht 5' 2.5" (1.588 m)   Wt 130 lb 12.8 oz (59.3 kg)   SpO2 96%   BMI 23.54 kg/m  , BMI Body mass index is 23.54 kg/m. General:Pleasant affect, NAD Skin:Warm and dry, brisk capillary refill HEENT:normocephalic, sclera clear, mucus membranes moist Heart:S1S2 RRR without murmur, gallup, rub or click Lungs:clear without rales, rhonchi, or wheezes Ext:no lower ext edema, 2+ pedal pulses, 2+ radial pulses Neuro:alert and oriented X 3, MAE, follows commands, + facial symmetry    EKG:  EKG is NOT ordered today.  Recent Labs: 05/28/2016: ALT 31 11/06/2016: Magnesium 2.1; TSH 1.720 11/16/2016: BUN 8; Creatinine, Ser 0.72; Hemoglobin 11.4; Platelets 187; Potassium 3.3; Sodium 134    Lipid Panel    Component Value Date/Time   CHOL 195 05/28/2016 0809   TRIG 80 05/28/2016 0809   HDL 67 05/28/2016 0809   CHOLHDL 2.9 05/28/2016 0809   VLDL 16 05/28/2016 0809   LDLCALC 112 (H) 05/28/2016 0809       Other studies Reviewed: Additional studies/ records that were reviewed today include:  Holter . Study  Highlights     Normal sinus rhythm  Rare PVCs and PACs  No correlation between  complaints of heart racing and arrhythmia.  Occasional episodes of bradycardia during sleep.   Normal study.  No correlation between complaint of heart racing and rhythm disturbance.  HEART RATE EPISODES Minimum HR: 45 BPM at 5:40:31 AM Maximum HR: 138 BPM at 11:34:40 AM Average HR: 73 BPM     ETT Study Highlights     Blood pressure demonstrated a normal response to exercise.  There was no ST segment deviation noted during stress.  7 minutes and 13 seconds of exercise him a good effort with no chest pain  Low risk exercise treadmill test with no electrocardiographic evidence of ischemia       ASSESSMENT AND PLAN:  1.  Tachycardia non on 48 holter and since episodes have resolved.  May have been SVT with drop in BP,  Follow up with Dr. Tamala Julian in august.  2.  CAD with normal ETT. Reassuring. Continue ranexa.   3.  Episodic hypotension increase daily fluids and salt.   4.  HLD continue  Statin.      Current medicines are reviewed with the patient today.  The patient Has no concerns regarding medicines.  The following changes have been made:  See above Labs/ tests ordered today include:see above  Disposition:   FU:  see above  Signed, Cecilie Kicks, NP  11/22/2016 12:35 PM    [facility], [place], [place], [place] Phone: [phone]; Fax: [phone]

## 2016-11-22 ENCOUNTER — Encounter: Payer: Self-pay | Admitting: Cardiology

## 2016-11-22 ENCOUNTER — Ambulatory Visit (INDEPENDENT_AMBULATORY_CARE_PROVIDER_SITE_OTHER): Payer: Medicare Other | Admitting: Cardiology

## 2016-11-22 VITALS — BP 100/60 | HR 75 | Ht 62.5 in | Wt 130.8 lb

## 2016-11-22 DIAGNOSIS — R000 Tachycardia, unspecified: Secondary | ICD-10-CM | POA: Diagnosis not present

## 2016-11-22 DIAGNOSIS — I2510 Atherosclerotic heart disease of native coronary artery without angina pectoris: Secondary | ICD-10-CM

## 2016-11-22 DIAGNOSIS — I959 Hypotension, unspecified: Secondary | ICD-10-CM | POA: Diagnosis not present

## 2016-11-22 DIAGNOSIS — E784 Other hyperlipidemia: Secondary | ICD-10-CM

## 2016-11-22 DIAGNOSIS — E7849 Other hyperlipidemia: Secondary | ICD-10-CM

## 2016-11-22 NOTE — Patient Instructions (Signed)
Medication Instructions:    Your physician recommends that you continue on your current medications as directed. Please refer to the Current Medication list given to you today.    If you need a refill on your cardiac medications before your next appointment, please call your pharmacy.  Labwork: NONE ORDERED  TODAY    Testing/Procedures: NONE ORDERED  TODAY    Follow-Up: IN 2 TO 3 MONTHS IN  AUGUST WITH DR Tamala Julian    Any Other Special Instructions Will Be Listed Below (If Applicable).

## 2016-11-26 DIAGNOSIS — M5137 Other intervertebral disc degeneration, lumbosacral region: Secondary | ICD-10-CM | POA: Diagnosis not present

## 2016-11-26 DIAGNOSIS — M5126 Other intervertebral disc displacement, lumbar region: Secondary | ICD-10-CM | POA: Diagnosis not present

## 2016-11-26 DIAGNOSIS — M47816 Spondylosis without myelopathy or radiculopathy, lumbar region: Secondary | ICD-10-CM | POA: Diagnosis not present

## 2016-12-03 DIAGNOSIS — J0100 Acute maxillary sinusitis, unspecified: Secondary | ICD-10-CM | POA: Diagnosis not present

## 2016-12-03 DIAGNOSIS — R05 Cough: Secondary | ICD-10-CM | POA: Diagnosis not present

## 2016-12-12 DIAGNOSIS — M65331 Trigger finger, right middle finger: Secondary | ICD-10-CM | POA: Diagnosis not present

## 2016-12-18 DIAGNOSIS — J209 Acute bronchitis, unspecified: Secondary | ICD-10-CM | POA: Diagnosis not present

## 2016-12-18 DIAGNOSIS — M65331 Trigger finger, right middle finger: Secondary | ICD-10-CM | POA: Diagnosis not present

## 2016-12-26 DIAGNOSIS — M65331 Trigger finger, right middle finger: Secondary | ICD-10-CM | POA: Diagnosis not present

## 2017-01-08 DIAGNOSIS — M65331 Trigger finger, right middle finger: Secondary | ICD-10-CM | POA: Diagnosis not present

## 2017-01-14 DIAGNOSIS — M5136 Other intervertebral disc degeneration, lumbar region: Secondary | ICD-10-CM | POA: Diagnosis not present

## 2017-01-23 ENCOUNTER — Ambulatory Visit (INDEPENDENT_AMBULATORY_CARE_PROVIDER_SITE_OTHER): Payer: Medicare Other | Admitting: Interventional Cardiology

## 2017-01-23 ENCOUNTER — Encounter: Payer: Self-pay | Admitting: Interventional Cardiology

## 2017-01-23 VITALS — BP 116/70 | HR 95 | Ht 62.5 in | Wt 129.8 lb

## 2017-01-23 DIAGNOSIS — E784 Other hyperlipidemia: Secondary | ICD-10-CM | POA: Diagnosis not present

## 2017-01-23 DIAGNOSIS — I25119 Atherosclerotic heart disease of native coronary artery with unspecified angina pectoris: Secondary | ICD-10-CM

## 2017-01-23 DIAGNOSIS — I10 Essential (primary) hypertension: Secondary | ICD-10-CM | POA: Diagnosis not present

## 2017-01-23 DIAGNOSIS — R002 Palpitations: Secondary | ICD-10-CM

## 2017-01-23 DIAGNOSIS — I6350 Cerebral infarction due to unspecified occlusion or stenosis of unspecified cerebral artery: Secondary | ICD-10-CM | POA: Diagnosis not present

## 2017-01-23 DIAGNOSIS — I209 Angina pectoris, unspecified: Secondary | ICD-10-CM

## 2017-01-23 DIAGNOSIS — E7849 Other hyperlipidemia: Secondary | ICD-10-CM

## 2017-01-23 NOTE — Patient Instructions (Signed)

## 2017-01-23 NOTE — Progress Notes (Signed)
Cardiology Office Note    Date:  01/23/2017   ID:  [name], DOB [birth year], MRN [id number]  PCP:  Leeroy Cha, MD  Cardiologist: [name] Grooms, MD   Chief Complaint  Patient presents with  . Coronary Artery Disease    History of Present Illness:  [name] is a 66 y.o. female has a hx of diffuse CAD, and Cerebral vascular disease.  HLD, HTN, and anxiety disorder. Other hx of bladder cancer.  Most recent clinical cardiac concerned was a presence of palpitations. She saw Cecilie Kicks, NP-C in May 2018 and a Holter monitor was performed. No significant abnormalities were found. She continues to be troubled by the palpitations. There are present predominantly in the morning. They are to very brief and self-limiting. Usually present when sitting or standing still. Not associated with lightheadedness, dizziness, or syncope.  Past Medical History:  Diagnosis Date  . Anxiety    Psychiatry Dr. Stan Head, concerns for memory loss, anxiety  . Bladder tumor   . Cancer ([place])    bladder CA  . Chronic chest pain   . Coronary atherosclerosis CARDIOLOGIST-  DR Daneen Schick   Cath 11/2006 demonstrating moderate obstructive coronary disease with 70-80% septal perforator #1, 50-70% first diagonal, 80% third diagonal, 50% mid and distal LAD & heavy calcification throughout all 3 coronaries. LVF normal.  . Esophageal motility disorder   . Essential hypertension, benign   . Fatty liver 04/28/12  . GERD (gastroesophageal reflux disease)   . H/O esophageal spasm   . Heart murmur   . History of esophageal spasm    takes ranexa  . History of TIA (transient ischemic attack)    2011--  no residuals  . Hyperlipidemia   . IBS (irritable bowel syndrome)    Diarrhea predominent, esophageal spasm severe, GERD - Dr. Elicia Lamp  . Interstitial cystitis    Dr. Gaynelle Arabian  . Memory loss    Psychiatry Dr. Stan Head, concerns for memory loss, anxiety  . PONV (postoperative  nausea and vomiting)    AND URINARY RETENTION  . Sensation of pressure in bladder area   . TMJ (temporomandibular joint syndrome)   . Wears glasses     Past Surgical History:  Procedure Laterality Date  . ABDOMINAL HYSTERECTOMY  AGE 29 -- 1989  . APPENDECTOMY  1971   AND OVARIAN CYSTECTOMY  . BENIGN EXCISIONAL BREAST BX  1996  . CARDIAC CATHETERIZATION  11-28-2006   DR Daneen Schick    moderate cad/  70-80% septal perforator #1, 50-70% first diagonal, 80% third diagonal, 50% mid and distal LAD & heavy calcification throughout all 3 coronaries. LVF normal.  . CARDIAC CATHETERIZATION  03-26-2012   DR Daneen Schick   patent CFX,  RCA,  & pLAD/  mLAD >50% to <70% a focal eccentric region that overlaps the third diagonal/  90% diagonal ostial and unchanged from prior study /   ef 65%  . CYSTO WITH HYDRODISTENSION N/A 01/14/2014   Procedure: CYSTOSCOPY/HYDRODISTENSION/INSERTION OF MARCAINE AND PYRIDUIM INTO BLADDER/INJECTION OF MARCAINE AND KENALOG into sub trigone space ;  Surgeon: Ailene Rud, MD;  Location: [facility];  Service: Urology;  Laterality: N/A;  . CYSTO/  HYDRODISTENTION/  INSTILLATION THERAPY  03-12-2010  . ESOPHAGOGASTRODUODENOSCOPY  04/30/2012   Procedure: ESOPHAGOGASTRODUODENOSCOPY (EGD);  Surgeon: Lafayette Dragon, MD;  Location: [facility];  Service: Endoscopy;  Laterality: N/A;  . LUMBAR [facility]  . NEGATIVE SLEEP STUDY  2010  .  SHOULDER ARTHROSCOPY Left 12-23-2011  . TRANSTHORACIC ECHOCARDIOGRAM  04-21-2012   GRADE I DIASTOLIC DYSFUNCTION/  EF 60-65%/  MILD MR  &  TR  . TRANSURETHRAL RESECTION OF BLADDER TUMOR Right 01/03/2015   Procedure: TRANSURETHRAL RESECTION OF BLADDER TUMOR (TURBT);  Surgeon: Carolan Clines, MD;  Location: [facility];  Service: Urology;  Laterality: Right;    Current Medications: Outpatient Medications Prior to Visit  Medication Sig Dispense Refill  . acyclovir (ZOVIRAX) 400 MG tablet Take 400 mg by  mouth 2 (two) times daily.    Marland Kitchen aspirin 81 MG tablet Take 81 mg by mouth at bedtime.     Marland Kitchen atorvastatin (LIPITOR) 20 MG tablet Take 20 mg by mouth at bedtime.     . calcium carbonate (TUMS - DOSED IN MG ELEMENTAL CALCIUM) 500 MG chewable tablet Chew 1 tablet by mouth as needed for indigestion or heartburn.    . Cholecalciferol (VITAMIN D) 1000 UNITS capsule Take 1,000 Units by mouth daily.    . clidinium-chlordiazePOXIDE (LIBRAX) 5-2.5 MG capsule Take 1 capsule by mouth daily as needed.     . Coenzyme Q10 (COQ10) 100 MG CAPS Take 100 mg by mouth daily.     . diazepam (VALIUM) 2 MG tablet Take 2 mg by mouth 2 (two) times daily as needed (bladder spasms).    . dicyclomine (BENTYL) 10 MG capsule Take 10 mg by mouth 3 (three) times daily as needed for spasms (IBS).    Marland Kitchen diltiazem 2 % GEL Apply 1 application topically as needed. For rectal spasm 30 g 1  . esomeprazole (NEXIUM) 40 MG packet Take 40 mg by mouth daily before breakfast. 30 each 6  . estradiol (CLIMARA - DOSED IN MG/24 HR) 0.025 mg/24hr patch Place 0.025 mg onto the skin once a week.    Marland Kitchen ibuprofen (ADVIL,MOTRIN) 200 MG tablet Take 600 mg by mouth every 6 (six) hours as needed for headache or moderate pain.    Marland Kitchen loperamide (IMODIUM A-D) 2 MG tablet Take 2 mg by mouth as needed for diarrhea or loose stools.    Marland Kitchen LORazepam (ATIVAN) 0.5 MG tablet take 1 tablet by mouth three times a day if needed 30 tablet 0  . nitroGLYCERIN (NITROSTAT) 0.4 MG SL tablet place 1 tablet under the tongue if needed every 5 minutes for chest pain for 3 doses IF NO RELIEF AFTER 3RD DOSE CALL PRESCRIBER OR 911. 75 tablet 1  . oxyCODONE (OXY IR/ROXICODONE) 5 MG immediate release tablet Take 5 mg by mouth as needed for moderate pain.     Marland Kitchen PRESCRIPTION MEDICATION BCG injection for bladder cancer given at [place]    . Probiotic Product (PROBIOTIC ACIDOPHILUS) CAPS Take 1 capsule by mouth daily.    . ranolazine (RANEXA) 500 MG 12 hr tablet Take 1 tablet (500 mg total) by  mouth 2 (two) times daily. 180 tablet 3  . temazepam (RESTORIL) 15 MG capsule Take 15 mg by mouth once a week. Does not take temazepam on a specific day    . cephALEXin (KEFLEX) 500 MG capsule Take 1 capsule (500 mg total) by mouth 3 (three) times daily. (Patient not taking: Reported on 01/23/2017) 21 capsule 0  . doxylamine, Sleep, (UNISOM) 25 MG tablet Take 37.5 mg by mouth at bedtime.     . fluticasone (FLONASE) 50 MCG/ACT nasal spray Place into both nostrils as needed for allergies or rhinitis.    Marland Kitchen linaclotide (LINZESS) 72 MCG capsule Take 72 mcg by mouth daily as needed (  constipation).    Marland Kitchen PREMARIN vaginal cream Place 1 application vaginally 2 (two) times a week.     Facility-Administered Medications Prior to Visit  Medication Dose Route Frequency Provider Last Rate Last Dose  . 0.9 %  sodium chloride infusion  500 mL Intravenous Continuous Nandigam, Venia Minks, MD         Allergies:   Clindamycin/lincomycin; Doxycycline; Escitalopram oxalate; Hydrocodone; Hyoscyamine; Macrodantin; Trazodone; Trazodone and nefazodone; Trimethoprim; Levsin [hyoscyamine sulfate]; Lincomycin; Nitrofurantoin; and Nitrofurantoin macrocrystal   Social History   Social History  . Marital status: Married    Spouse name: N/A  . Number of children: 2  . Years of education: N/A   Occupational History  . caregiver    Social History Main Topics  . Smoking status: Former Smoker    Years: 10.00    Types: Cigarettes    Quit date: 06/10/1973  . Smokeless tobacco: Never Used  . Alcohol use 1.2 oz/week    2 Glasses of wine per week     Comment: social  . Drug use: No  . Sexual activity: Yes   Other Topics Concern  . None   Social History Narrative  . None     Family History:  The patient's family history includes Alzheimer's disease in her mother; CAD in her father and mother; Colon polyps in her mother; Dementia in her mother; Depression in her other; Diverticulosis in her father; Heart disease in her  brother, father, and mother; Obesity in her other; Osteoarthritis in her other.   ROS:   Please see the history of present illness.    Anxiety, stress of her daughter being ill and having multiple surgeries over the past 6 months. Easy bruising. Otherwise no complaints.  All other systems reviewed and are negative.   PHYSICAL EXAM:   VS:  BP 116/70 (BP Location: Left Arm)   Pulse 95   Ht 5' 2.5" (1.588 m)   Wt 129 lb 12.8 oz (58.9 kg)   BMI 23.36 kg/m    GEN: Well nourished, well developed, in no acute distress  HEENT: normal  Neck: no JVD, carotid bruits, or masses Cardiac: RRR; no murmurs, rubs, or gallops,no edema  Respiratory:  clear to auscultation bilaterally, normal work of breathing GI: soft, nontender, nondistended, + BS MS: no deformity or atrophy  Skin: warm and dry, no rash Neuro:  Alert and Oriented x 3, Strength and sensation are intact Psych: euthymic mood, full affect  Wt Readings from Last 3 Encounters:  01/23/17 129 lb 12.8 oz (58.9 kg)  11/22/16 130 lb 12.8 oz (59.3 kg)  11/16/16 127 lb (57.6 kg)      Studies/Labs Reviewed:   EKG:  EKG  Not repeated. EKG performed in May 2018 was normal.  Recent Labs: 05/28/2016: ALT 31 11/06/2016: Magnesium 2.1; TSH 1.720 11/16/2016: BUN 8; Creatinine, Ser 0.72; Hemoglobin 11.4; Platelets 187; Potassium 3.3; Sodium 134   Lipid Panel    Component Value Date/Time   CHOL 195 05/28/2016 0809   TRIG 80 05/28/2016 0809   HDL 67 05/28/2016 0809   CHOLHDL 2.9 05/28/2016 0809   VLDL 16 05/28/2016 0809   LDLCALC 112 (H) 05/28/2016 0809    Additional studies/ records that were reviewed today include:   Holter monitor May 2018: Duration 48 hours Study Highlights     Normal sinus rhythm  Rare PVCs and PACs  No correlation between complaints of heart racing and arrhythmia.  Occasional episodes of bradycardia during sleep.  ASSESSMENT:    1. Coronary artery disease involving native coronary artery of  native heart with angina pectoris ([place])   2. Essential hypertension   3. Other hyperlipidemia   4. Cerebral artery occlusion with cerebral infarction [facility])      PLAN:  In order of problems listed above:  1. Asymptomatic with reference to angina. She is not even using the Ranexa as we did previously. She is having no exertion related chest discomfort. During the illness of her daughter she has not been is physically active. Her daughter is improving, [facility] will [facility]. 2. Recommend 30 day monitor although the patient wants to avoid this currently. She does not believe she is having enough trouble to proceed now.  3. Excellent blood pressure control with target 130/90 mmHg at last. 4. Continue therapy of lipids. Heart target his LDL 70. High-dose statin therapy is poorly tolerated. I've encouraged low fat plan based diet.   Overall she looks great and physically is doing well. Plan to see her in one year. If palpitations continue or worsen, we should consider a 30 day event monitor.  Medication Adjustments/Labs and Tests Ordered: Current medicines are reviewed at length with the patient today.  Concerns regarding medicines are outlined above.  Medication changes, Labs and Tests ordered today are listed in the Patient Instructions below. Patient Instructions  Medication Instructions:  None  Labwork: None  Testing/Procedures: None  Follow-Up: Your physician wants you to follow-up in: 1 year with Dr. Tamala Julian.  You will receive a reminder letter in the mail two months in advance. If you don't receive a letter, please call our office to schedule the follow-up appointment.   Any Other Special Instructions Will Be Listed Below (If Applicable).     If you need a refill on your cardiac medications before your next appointment, please call your pharmacy.      Signed, [name] Grooms, MD  01/23/2017 10:31 AM    [facility] [address], [place], [place]  [address] Phone: [phone]; Fax: [phone]

## 2017-02-11 ENCOUNTER — Other Ambulatory Visit: Payer: Self-pay | Admitting: Gastroenterology

## 2017-02-17 ENCOUNTER — Inpatient Hospital Stay (HOSPITAL_COMMUNITY)
Admission: EM | Admit: 2017-02-17 | Discharge: 2017-02-20 | DRG: 281 | Disposition: A | Discharge status: Home / Self Care | Payer: Medicare Other | Attending: Interventional Cardiology | Admitting: Interventional Cardiology

## 2017-02-17 ENCOUNTER — Encounter (HOSPITAL_COMMUNITY): Payer: Self-pay | Admitting: Emergency Medicine

## 2017-02-17 ENCOUNTER — Emergency Department (HOSPITAL_COMMUNITY): Payer: Medicare Other

## 2017-02-17 ENCOUNTER — Telehealth: Payer: Self-pay | Admitting: Internal Medicine

## 2017-02-17 DIAGNOSIS — I25118 Atherosclerotic heart disease of native coronary artery with other forms of angina pectoris: Secondary | ICD-10-CM | POA: Diagnosis not present

## 2017-02-17 DIAGNOSIS — Z881 Allergy status to other antibiotic agents status: Secondary | ICD-10-CM | POA: Diagnosis not present

## 2017-02-17 DIAGNOSIS — E785 Hyperlipidemia, unspecified: Secondary | ICD-10-CM | POA: Diagnosis not present

## 2017-02-17 DIAGNOSIS — K219 Gastro-esophageal reflux disease without esophagitis: Secondary | ICD-10-CM | POA: Diagnosis present

## 2017-02-17 DIAGNOSIS — I214 Non-ST elevation (NSTEMI) myocardial infarction: Principal | ICD-10-CM | POA: Diagnosis present

## 2017-02-17 DIAGNOSIS — R079 Chest pain, unspecified: Secondary | ICD-10-CM | POA: Diagnosis not present

## 2017-02-17 DIAGNOSIS — Z885 Allergy status to narcotic agent status: Secondary | ICD-10-CM

## 2017-02-17 DIAGNOSIS — Z87891 Personal history of nicotine dependence: Secondary | ICD-10-CM

## 2017-02-17 DIAGNOSIS — K594 Anal spasm: Secondary | ICD-10-CM

## 2017-02-17 DIAGNOSIS — I5181 Takotsubo syndrome: Secondary | ICD-10-CM | POA: Diagnosis present

## 2017-02-17 DIAGNOSIS — I25119 Atherosclerotic heart disease of native coronary artery with unspecified angina pectoris: Secondary | ICD-10-CM | POA: Diagnosis present

## 2017-02-17 DIAGNOSIS — Z8673 Personal history of transient ischemic attack (TIA), and cerebral infarction without residual deficits: Secondary | ICD-10-CM

## 2017-02-17 DIAGNOSIS — K760 Fatty (change of) liver, not elsewhere classified: Secondary | ICD-10-CM | POA: Diagnosis present

## 2017-02-17 DIAGNOSIS — I10 Essential (primary) hypertension: Secondary | ICD-10-CM | POA: Diagnosis present

## 2017-02-17 DIAGNOSIS — Z8551 Personal history of malignant neoplasm of bladder: Secondary | ICD-10-CM

## 2017-02-17 DIAGNOSIS — Z8249 Family history of ischemic heart disease and other diseases of the circulatory system: Secondary | ICD-10-CM

## 2017-02-17 DIAGNOSIS — I255 Ischemic cardiomyopathy: Secondary | ICD-10-CM | POA: Diagnosis present

## 2017-02-17 DIAGNOSIS — Z7982 Long term (current) use of aspirin: Secondary | ICD-10-CM

## 2017-02-17 DIAGNOSIS — F419 Anxiety disorder, unspecified: Secondary | ICD-10-CM | POA: Diagnosis present

## 2017-02-17 DIAGNOSIS — K224 Dyskinesia of esophagus: Secondary | ICD-10-CM | POA: Diagnosis present

## 2017-02-17 DIAGNOSIS — Z79899 Other long term (current) drug therapy: Secondary | ICD-10-CM

## 2017-02-17 DIAGNOSIS — E876 Hypokalemia: Secondary | ICD-10-CM | POA: Diagnosis present

## 2017-02-17 DIAGNOSIS — Z888 Allergy status to other drugs, medicaments and biological substances status: Secondary | ICD-10-CM

## 2017-02-17 LAB — CBC WITH DIFFERENTIAL/PLATELET
Basophils Absolute: 0 10*3/uL (ref 0.0–0.1)
Basophils Relative: 0 %
Eosinophils Absolute: 0.1 10*3/uL (ref 0.0–0.7)
Eosinophils Relative: 1 %
HCT: 31.9 % — ABNORMAL LOW (ref 36.0–46.0)
Hemoglobin: 10.5 g/dL — ABNORMAL LOW (ref 12.0–15.0)
Lymphocytes Relative: 25 %
Lymphs Abs: 1.2 10*3/uL (ref 0.7–4.0)
MCH: 28.5 pg (ref 26.0–34.0)
MCHC: 32.9 g/dL (ref 30.0–36.0)
MCV: 86.7 fL (ref 78.0–100.0)
Monocytes Absolute: 0.5 10*3/uL (ref 0.1–1.0)
Monocytes Relative: 10 %
Neutro Abs: 3.1 10*3/uL (ref 1.7–7.7)
Neutrophils Relative %: 64 %
Platelets: 208 10*3/uL (ref 150–400)
RBC: 3.68 MIL/uL — ABNORMAL LOW (ref 3.87–5.11)
RDW: 13.9 % (ref 11.5–15.5)
WBC: 4.8 10*3/uL (ref 4.0–10.5)

## 2017-02-17 LAB — I-STAT TROPONIN, ED: Troponin i, poc: 0.17 ng/mL (ref 0.00–0.08)

## 2017-02-17 LAB — COMPREHENSIVE METABOLIC PANEL
ALT: 25 U/L (ref 14–54)
AST: 25 U/L (ref 15–41)
Albumin: 3.7 g/dL (ref 3.5–5.0)
Alkaline Phosphatase: 77 U/L (ref 38–126)
Anion gap: 6 (ref 5–15)
BUN: 11 mg/dL (ref 6–20)
CO2: 25 mmol/L (ref 22–32)
Calcium: 8.5 mg/dL — ABNORMAL LOW (ref 8.9–10.3)
Chloride: 109 mmol/L (ref 101–111)
Creatinine, Ser: 0.76 mg/dL (ref 0.44–1.00)
GFR calc Af Amer: >60 mL/min (ref >60)
GFR calc non Af Amer: >60 mL/min (ref >60)
Glucose, Bld: 98 mg/dL (ref 65–99)
Potassium: 3.4 mmol/L — ABNORMAL LOW (ref 3.5–5.1)
Sodium: 140 mmol/L (ref 135–145)
Total Bilirubin: 0.3 mg/dL (ref 0.3–1.2)
Total Protein: 6.1 g/dL — ABNORMAL LOW (ref 6.5–8.1)

## 2017-02-17 MED ORDER — HEPARIN (PORCINE) IN NACL 100-0.45 UNIT/ML-% IJ SOLN
700.0000 [IU]/h | INTRAMUSCULAR | Status: DC
  Administered 2017-02-18: 700 [IU]/h via INTRAVENOUS
  Filled 2017-02-17: qty 250

## 2017-02-17 MED ORDER — NITROGLYCERIN 0.4 MG SL SUBL: 0.4000 mg | SUBLINGUAL_TABLET | SUBLINGUAL | Status: DC | PRN

## 2017-02-17 MED ORDER — HEPARIN BOLUS VIA INFUSION
3500.0000 [IU] | Freq: Once | INTRAVENOUS | Status: AC
  Administered 2017-02-18: 3500 [IU] via INTRAVENOUS
  Filled 2017-02-17: qty 3500

## 2017-02-17 MED ORDER — NITROGLYCERIN 2 % TD OINT: 0.5000 [in_us] | TOPICAL_OINTMENT | Freq: Once | TRANSDERMAL | Status: DC

## 2017-02-17 MED ORDER — NITROGLYCERIN IN D5W 200-5 MCG/ML-% IV SOLN
0.0000 ug/min | INTRAVENOUS | Status: DC
  Administered 2017-02-17: 5 ug/min via INTRAVENOUS
  Filled 2017-02-17: qty 250

## 2017-02-17 NOTE — ED Notes (Signed)
Dr Leonides Schanz given a copy of troponin results .[name]

## 2017-02-17 NOTE — Progress Notes (Signed)
ANTICOAGULATION CONSULT NOTE - Initial Consult  Pharmacy Consult for heparin  Indication: chest pain/ACS  Allergies  Allergen Reactions  . Clindamycin/Lincomycin Other (See Comments)    Pt not sure of reaction   . Doxycycline Nausea Only and Nausea And Vomiting  . Escitalopram Oxalate Nausea Only  . Hydrocodone Itching  . Hyoscyamine Itching    REACTION: itchy  . Macrodantin Other (See Comments)    Flushed, hot  . Trazodone Nausea Only  . Trazodone And Nefazodone Nausea Only  . Trimethoprim Other (See Comments)    unknown unknown  . Levsin [Hyoscyamine Sulfate] Hives, Swelling, Anxiety and Photosensitivity    Vision issues.  . Lincomycin Rash    Reaction unknown  . Nitrofurantoin Rash    Flushed, hot  . Nitrofurantoin Macrocrystal Anxiety    Flushing, hot face and ears    Patient Measurements: Height: 5' 2.5" (158.8 cm) Weight: 130 lb (59 kg) IBW/kg (Calculated) : 51.25 Heparin Dosing Weight: 59 kg   Vital Signs: Temp: 98 F (36.7 C) (09/10 2229) Temp Source: Oral (09/10 2229) BP: 138/84 (09/10 2300) Pulse Rate: 94 (09/10 2300)  Labs:  Recent Labs  02/17/17 2244  HGB 10.5*  HCT 31.9*  PLT 208    CrCl cannot be calculated (Patient's most recent lab result is older than the maximum 21 days allowed.).   Medical History: Past Medical History:  Diagnosis Date  . Anxiety    Psychiatry Dr. Stan Head, concerns for memory loss, anxiety  . Bladder tumor   . Cancer ([place])    bladder CA  . Chronic chest pain   . Coronary atherosclerosis CARDIOLOGIST-  DR Daneen Schick   Cath 11/2006 demonstrating moderate obstructive coronary disease with 70-80% septal perforator #1, 50-70% first diagonal, 80% third diagonal, 50% mid and distal LAD & heavy calcification throughout all 3 coronaries. LVF normal.  . Esophageal motility disorder   . Essential hypertension, benign   . Fatty liver 04/28/12  . GERD (gastroesophageal reflux disease)   . H/O esophageal spasm   .  Heart murmur   . History of esophageal spasm    takes ranexa  . History of TIA (transient ischemic attack)    2011--  no residuals  . Hyperlipidemia   . IBS (irritable bowel syndrome)    Diarrhea predominent, esophageal spasm severe, GERD - Dr. Elicia Lamp  . Interstitial cystitis    Dr. Gaynelle Arabian  . Memory loss    Psychiatry Dr. Stan Head, concerns for memory loss, anxiety  . PONV (postoperative nausea and vomiting)    AND URINARY RETENTION  . Sensation of pressure in bladder area   . TMJ (temporomandibular joint syndrome)   . Wears glasses    Assessment: 66 yo female admitted with chest pain. Pharmacy consulted to dose heparin. No oral anticoagulation PTA per patient. Troponin elevated at 0.17.   Hgb 10.5 and platelets within normal limits. No s/s bleeding noted.   Goal of Therapy:  Heparin level 0.3-0.7 units/ml Monitor platelets by anticoagulation protocol: Yes   Plan:  Heparin bolus 3500 units IV x1 Start heparin gtt at 700 units/hr Heparin level in 6 hrs Daily heparin level and CBC Monitor for s/s bleeding   Argie Ramming, PharmD Clinical Pharmacist 02/17/17 11:51 PM

## 2017-02-17 NOTE — Telephone Encounter (Signed)
Patient called that she is having elevated bp of 170s and 160s along with palpitations and chest pressure. She was told to go to ED via 911 and get evaluated.

## 2017-02-17 NOTE — ED Triage Notes (Signed)
Per Pt: CP starting at 2050 tonight just after eating dinner.  Pt states it felt like her heart was pounding in her chest.  Pt checked her BP and it was 284 systolically with HR 13'K.   Hx of Cardiac Calcification  Pt has had 3 of nitro 324 ASP 6 of Morphine Pt say pain has subsided.   157/90  90 NSR 99%

## 2017-02-17 NOTE — ED Provider Notes (Signed)
  Face-to-face evaluation   History: Chest pain which started tonight.  Physical exam: Alert, tearful, calm. No respiratory distress. She is lucid.   Medical screening examination/treatment/procedure(s) were conducted as a shared visit with non-physician practitioner(s) and myself.  I personally evaluated the patient during the encounter   Daleen Bo, MD 02/18/17 [phone]

## 2017-02-17 NOTE — ED Provider Notes (Signed)
[facility] DEPT Provider Note   CSN: [id number] Arrival date & time: 02/17/17  2205     History   Chief Complaint Chief Complaint  Patient presents with  . Chest Pain    HPI [name] is a 66 y.o. female.  [name] is a 66 y.o. Female who presents to the ED complaining of chest pain with onset this evening around 8 PM. Patient which she was sitting on a couch when she had onset of chest pressure and felt like her heart was racing. She reports then her chest pressure turned into chest pain that worsened when she walked upstairs. She denies having any shortness of breath. She reports the pain is in the center of her chest and was severe.  She reports a history of known coronary artery disease. No stenting previously. She denies history of MI. She has a close family history of MI. She is followed by cardiologist Dr. Daneen Schick. She took 2 nitroglycerin at home with some relief of her pain. She received a third dose of NTG by EMS and then some morphine which helped her pain tremendously. She was chest pain-free and on arrival to the emergency department and now her pain is slightly coming back at my evaluation. She took 324 mg of aspirin prior to arrival. She was previously diagnosed with bladder cancer and underwent BCG treatment previously. She denies fevers, coughing, shortness of breath, cough, hemoptysis, lightheadedness, dizziness, syncope, leg pain, leg swollen, recent long travel, history of DVT or PE.   The history is provided by the patient, medical records and the spouse. No language interpreter was used.  Chest Pain   Associated symptoms include palpitations. Pertinent negatives include no abdominal pain, no back pain, no cough, no dizziness, no fever, no headaches, no nausea, no shortness of breath, no vomiting and no weakness.    Past Medical History:  Diagnosis Date  . Anxiety    Psychiatry Dr. Stan Head, concerns for memory loss, anxiety  . Bladder tumor    . Cancer ([place])    bladder CA  . Chronic chest pain   . Coronary atherosclerosis CARDIOLOGIST-  DR Daneen Schick   Cath 11/2006 demonstrating moderate obstructive coronary disease with 70-80% septal perforator #1, 50-70% first diagonal, 80% third diagonal, 50% mid and distal LAD & heavy calcification throughout all 3 coronaries. LVF normal.  . Esophageal motility disorder   . Essential hypertension, benign   . Fatty liver 04/28/12  . GERD (gastroesophageal reflux disease)   . H/O esophageal spasm   . Heart murmur   . History of esophageal spasm    takes ranexa  . History of TIA (transient ischemic attack)    2011--  no residuals  . Hyperlipidemia   . IBS (irritable bowel syndrome)    Diarrhea predominent, esophageal spasm severe, GERD - Dr. Elicia Lamp  . Interstitial cystitis    Dr. Gaynelle Arabian  . Memory loss    Psychiatry Dr. Stan Head, concerns for memory loss, anxiety  . PONV (postoperative nausea and vomiting)    AND URINARY RETENTION  . Sensation of pressure in bladder area   . TMJ (temporomandibular joint syndrome)   . Wears glasses     Patient Active Problem List   Diagnosis Date Noted  . NSTEMI (non-ST elevated myocardial infarction) ([place]) 02/18/2017  . Rectal spasm 04/11/2015  . Rectal bleeding 03/23/2013  . Reflux esophagitis 04/30/2012  . Other dysphagia 04/30/2012  . Syncope 04/20/2012  . Cerebral artery occlusion with cerebral  infarction ([place]) 01/17/2010  . FREQUENCY, URINARY 01/17/2010  . Hyperlipidemia 08/05/2007  . Essential hypertension 08/05/2007  . Coronary artery disease involving native coronary artery of native heart with angina pectoris ([place]) 08/05/2007  . Irritable bowel syndrome 08/05/2007  . PROCTALGIA FUGAX 08/05/2007    Past Surgical History:  Procedure Laterality Date  . ABDOMINAL HYSTERECTOMY  AGE 66 -- 1989  . APPENDECTOMY  1971   AND OVARIAN CYSTECTOMY  . BENIGN EXCISIONAL BREAST BX  1996  . CARDIAC CATHETERIZATION  11-28-2006   DR  Daneen Schick    moderate cad/  70-80% septal perforator #1, 50-70% first diagonal, 80% third diagonal, 50% mid and distal LAD & heavy calcification throughout all 3 coronaries. LVF normal.  . CARDIAC CATHETERIZATION  03-26-2012   DR Daneen Schick   patent CFX,  RCA,  & pLAD/  mLAD >50% to <70% a focal eccentric region that overlaps the third diagonal/  90% diagonal ostial and unchanged from prior study /   ef 65%  . CYSTO WITH HYDRODISTENSION N/A 01/14/2014   Procedure: CYSTOSCOPY/HYDRODISTENSION/INSERTION OF MARCAINE AND PYRIDUIM INTO BLADDER/INJECTION OF MARCAINE AND KENALOG into sub trigone space ;  Surgeon: Ailene Rud, MD;  Location: [facility];  Service: Urology;  Laterality: N/A;  . CYSTO/  HYDRODISTENTION/  INSTILLATION THERAPY  03-12-2010  . ESOPHAGOGASTRODUODENOSCOPY  04/30/2012   Procedure: ESOPHAGOGASTRODUODENOSCOPY (EGD);  Surgeon: Lafayette Dragon, MD;  Location: [facility];  Service: Endoscopy;  Laterality: N/A;  . LUMBAR [place]  . NEGATIVE SLEEP STUDY  2010  . SHOULDER ARTHROSCOPY Left 12-23-2011  . TRANSTHORACIC ECHOCARDIOGRAM  04-21-2012   GRADE I DIASTOLIC DYSFUNCTION/  EF 60-65%/  MILD MR  &  TR  . TRANSURETHRAL RESECTION OF BLADDER TUMOR Right 01/03/2015   Procedure: TRANSURETHRAL RESECTION OF BLADDER TUMOR (TURBT);  Surgeon: Carolan Clines, MD;  [facility];  Service: Urology;  Laterality: Right;    OB History    No data available       Home Medications    Prior to Admission medications   Medication Sig Start Date End Date Taking? Authorizing Provider  acyclovir (ZOVIRAX) 400 MG tablet Take 400 mg by mouth 2 (two) times daily.   Yes [provider]  aspirin 81 MG tablet Take 81 mg by mouth at bedtime.    Yes [provider]  atorvastatin (LIPITOR) 20 MG tablet Take 20 mg by mouth at bedtime.    Yes [provider]  Cholecalciferol (VITAMIN D) 1000 UNITS capsule Take 1,000 Units  by mouth daily.   Yes [provider]  dicyclomine (BENTYL) 10 MG capsule Take 10 mg by mouth 3 (three) times daily as needed for spasms (IBS).   Yes [provider]  diltiazem 2 % GEL Apply 1 application topically as needed. For rectal spasm 04/06/15  Yes Zehr, Laban Emperor, PA-C  esomeprazole (NEXIUM) 40 MG packet Take 40 mg by mouth daily before breakfast. 06/09/15  Yes Nandigam, Venia Minks, MD  estradiol (CLIMARA - DOSED IN MG/24 HR) 0.025 mg/24hr patch Place 0.025 mg onto the skin once a week.   Yes [provider]  ibuprofen (ADVIL,MOTRIN) 200 MG tablet Take 600 mg by mouth every 6 (six) hours as needed for headache or moderate pain.   Yes [provider]  loperamide (IMODIUM A-D) 2 MG tablet Take 2 mg by mouth as needed for diarrhea or loose stools.   Yes [provider]  LORazepam (ATIVAN) 0.5 MG tablet take  1 tablet by mouth three times a day if needed Patient taking differently: take 1 tablet by mouth three times a day if needed esophageal spasms 08/27/16  Yes Zehr, Laban Emperor, PA-C  nitroGLYCERIN (NITROSTAT) 0.4 MG SL tablet place 1 tablet under the tongue if needed every 5 minutes for chest pain for 3 doses IF NO RELIEF AFTER 3RD DOSE CALL PRESCRIBER OR 911. 09/29/15  Yes Belva Crome, MD  oxyCODONE (OXY IR/ROXICODONE) 5 MG immediate release tablet Take 5 mg by mouth as needed for moderate pain.  01/26/15  Yes [provider]  Probiotic Product (PROBIOTIC ACIDOPHILUS) CAPS Take 1 capsule by mouth daily.   Yes [provider]  QUEtiapine (SEROQUEL) 25 MG tablet Take 25 mg by mouth at bedtime. For sleep  (pt takes seroquel OR temazepam for sleep) 12/27/16  Yes [provider]  ranolazine (RANEXA) 500 MG 12 hr tablet Take 1 tablet (500 mg total) by mouth 2 (two) times daily. 04/12/16  Yes Belva Crome, MD  temazepam (RESTORIL) 15 MG capsule Take 15 mg by mouth See admin instructions. Approximately twice weekly for sleep instead  of seroquel 02/11/12  Yes [provider]    Family History Family History  Problem Relation Age of Onset  . Heart disease Mother   . Colon polyps Mother   . Alzheimer's disease Mother   . Dementia Mother   . CAD Mother   . Heart disease Father   . Diverticulosis Father   . CAD Father        in his 56s  . Heart disease Brother   . Osteoarthritis Other        Multiple family members  . Obesity Other        Multiple family members  . Depression Other        Multiple family members  . Colon cancer Neg Hx     Social History Social History  Substance Use Topics  . Smoking status: Former Smoker    Years: 10.00    Types: Cigarettes    Quit date: 06/10/1973  . Smokeless tobacco: Never Used  . Alcohol use 1.2 oz/week    2 Glasses of wine per week     Comment: social     Allergies   Clindamycin/lincomycin; Doxycycline; Escitalopram oxalate; Hydrocodone; Hyoscyamine; Macrodantin; Trazodone; Trazodone and nefazodone; Trimethoprim; Levsin [hyoscyamine sulfate]; Lincomycin; Nitrofurantoin; and Nitrofurantoin macrocrystal   Review of Systems Review of Systems  Constitutional: Negative for chills and fever.  HENT: Negative for congestion and sore throat.   Eyes: Negative for visual disturbance.  Respiratory: Negative for cough, shortness of breath and wheezing.   Cardiovascular: Positive for chest pain and palpitations. Negative for leg swelling.  Gastrointestinal: Negative for abdominal pain, diarrhea, nausea and vomiting.  Genitourinary: Negative for dysuria.  Musculoskeletal: Negative for back pain and neck pain.  Skin: Negative for rash.  Neurological: Negative for dizziness, syncope, weakness, light-headedness and headaches.     Physical Exam Updated Vital Signs BP 124/88   Pulse 90   Temp 98 F (36.7 C) (Oral)   Resp 16   Ht 5' 2.5" (1.588 m)   Wt 59 kg (130 lb)   SpO2 96%   BMI 23.40 kg/m   Physical Exam  Constitutional: She appears well-developed  and well-nourished. No distress.  Nontoxic appearing.  HENT:  Head: Normocephalic and atraumatic.  Mouth/Throat: Oropharynx is clear and moist.  Eyes: Pupils are equal, round, and reactive to light. Conjunctivae are normal. Right eye exhibits  no discharge. Left eye exhibits no discharge.  Neck: Neck supple. No JVD present. No tracheal deviation present.  Cardiovascular: Normal rate, regular rhythm, normal heart sounds and intact distal pulses.  Exam reveals no gallop and no friction rub.   No murmur heard. Bilateral radial, posterior tibialis and dorsalis pedis pulses are intact.    Pulmonary/Chest: Effort normal and breath sounds normal. No stridor. No respiratory distress. She has no wheezes. She has no rales. She exhibits no tenderness.  Lungs are clear to ascultation bilaterally. Symmetric chest expansion bilaterally. No increased work of breathing. No rales or rhonchi.    Abdominal: Soft. There is no tenderness.  Musculoskeletal: She exhibits no edema or tenderness.  No lower extremity edema or tenderness.  Lymphadenopathy:    She has no cervical adenopathy.  Neurological: She is alert. No sensory deficit. Coordination normal.  Skin: Skin is warm and dry. Capillary refill takes less than 2 seconds. No rash noted. She is not diaphoretic. No erythema. No pallor.  Psychiatric: She has a normal mood and affect. Her behavior is normal.  Nursing note and vitals reviewed.    ED Treatments / Results  Labs (all labs ordered are listed, but only abnormal results are displayed) Labs Reviewed  COMPREHENSIVE METABOLIC PANEL - Abnormal; Notable for the following:       Result Value   Potassium 3.4 (*)    Calcium 8.5 (*)    Total Protein 6.1 (*)    All other components within normal limits  CBC WITH DIFFERENTIAL/PLATELET - Abnormal; Notable for the following:    RBC 3.68 (*)    Hemoglobin 10.5 (*)    HCT 31.9 (*)    All other components within normal limits  TROPONIN I - Abnormal;  Notable for the following:    Troponin I 0.72 (*)    All other components within normal limits  I-STAT TROPONIN, ED - Abnormal; Notable for the following:    Troponin i, poc 0.17 (*)    All other components within normal limits  HEPARIN LEVEL (UNFRACTIONATED)  CBC    EKG  EKG Interpretation  Date/Time:  Monday February 17 2017 22:43:44 EDT Ventricular Rate:  83 PR Interval:    QRS Duration: 96 QT Interval:  422 QTC Calculation: 496 R Axis:   84 Text Interpretation:  Sinus rhythm Borderline right axis deviation Borderline prolonged QT interval Confirmed by Pryor Curia [phone]) on 02/18/2017 1:32:11 AM       Radiology Dg Chest 2 View  Result Date: 02/17/2017 CLINICAL DATA:  Mid chest pain with elevated blood pressure tonight. EXAM: CHEST  2 VIEW COMPARISON:  08/05/2015 FINDINGS: Mild hyperinflation likely representing emphysema. Normal heart size and pulmonary vascularity. No focal airspace disease or consolidation in the lungs. No blunting of costophrenic angles. No pneumothorax. Mediastinal contours appear intact. Mild degenerative changes in the spine. IMPRESSION: Emphysematous changes in the lungs. No evidence of active pulmonary disease. Electronically Signed   By: Lucienne Capers M.D.   On: 02/17/2017 23:28    Procedures Procedures (including critical care time)  CRITICAL CARE Performed by: Hanley Hays   Total critical care time: 50 minutes  Critical care time was exclusive of separately billable procedures and treating other patients.  Critical care was necessary to treat or prevent imminent or life-threatening deterioration.  Critical care was time spent personally by me on the following activities: development of treatment plan with patient and/or surrogate as well as nursing, discussions with consultants, evaluation of patient's response to treatment, examination of  patient, obtaining history from patient or surrogate, ordering and performing treatments  and interventions, ordering and review of laboratory studies, ordering and review of radiographic studies, pulse oximetry and re-evaluation of patient's condition.   Medications Ordered in ED Medications  nitroGLYCERIN (NITROSTAT) SL tablet 0.4 mg (not administered)  nitroGLYCERIN 50 mg in dextrose 5 % 250 mL (0.2 mg/mL) infusion (20 mcg/min Intravenous Rate/Dose Change 02/18/17 0110)  heparin ADULT infusion 100 units/mL (25000 units/257mL sodium chloride 0.45%) (700 Units/hr Intravenous New Bag/Given 02/18/17 0012)  heparin bolus via infusion 3,500 Units (3,500 Units Intravenous Bolus from Bag 02/18/17 0012)  fentaNYL (SUBLIMAZE) injection 50 mcg (50 mcg Intravenous Given 02/18/17 0109)     Initial Impression / Assessment and Plan / ED Course  I have reviewed the triage vital signs and the nursing notes.  Pertinent labs & imaging results that were available during my care of the patient were reviewed by me and considered in my medical decision making (see chart for details).     This is a 66 y.o. Female who presents to the ED complaining of chest pain with onset this evening around 8 PM. Patient which she was sitting on a couch when she had onset of chest pressure and felt like her heart was racing. She reports then her chest pressure turned into chest pain that worsened when she walked upstairs. She denies having any shortness of breath. She reports the pain is in the center of her chest and was severe.  She reports a history of known coronary artery disease. No stenting previously. She denies history of MI. She has a close family history of MI. She is followed by cardiologist Dr. Daneen Schick. She took 2 nitroglycerin at home with some relief of her pain. She received a third dose of NTG by EMS and then some morphine which helped her pain tremendously. She was chest pain-free and on arrival to the emergency department and now her pain is slightly coming back at my evaluation. She took 324 mg of  aspirin prior to arrival. She was previously diagnosed with bladder cancer and underwent BCG treatment previously. She denies SOB.  On exam the patient is afebrile and nontoxic appearing. Lungs are clear to auscultation bilaterally. No lower extremity edema or tenderness. She is mildly hypertensive. EKG shows no evidence of STEMI. Borderline prolonged QT interval. I-STAT troponin returned elevated at 0.17. I immediately ordered heparin and nitroglycerin. I immediately ordered a consult with cardiology. I also ordered a troponin I. I went to bedside to discuss plan with patient.  CBC and CMP are unremarkable. Chest x-ray shows emphysematous changes. I consulted with cardiology fellow Dr. Eula Fried who accepted the patient for admission and reports he will place orders for admission. Patient and family agree with plan for admission.  At reevaluation patient reports she is having some return of pain. Blood pressure is 323 systolic. Will increase her nitroglycerin drip rate. Will provide patient with fentanyl. Patient admitted to cardiology service.   This patient was discussed with and evaluated by Dr. Eulis Foster who agrees with assessment and plan.   Final Clinical Impressions(s) / ED Diagnoses   Final diagnoses:  NSTEMI (non-ST elevated myocardial infarction) [facility])    New Prescriptions New Prescriptions   No medications on file     Waynetta Pean, Hershal Coria 02/18/17 [id number]    Daleen Bo, MD 02/18/17 [phone]

## 2017-02-18 ENCOUNTER — Encounter (HOSPITAL_COMMUNITY)
Admission: EM | Disposition: A | Discharge status: Home / Self Care | Payer: Self-pay | Source: Home / Self Care | Attending: Interventional Cardiology

## 2017-02-18 ENCOUNTER — Encounter (HOSPITAL_COMMUNITY): Payer: Self-pay | Admitting: Interventional Cardiology

## 2017-02-18 DIAGNOSIS — Z7982 Long term (current) use of aspirin: Secondary | ICD-10-CM | POA: Diagnosis not present

## 2017-02-18 DIAGNOSIS — E876 Hypokalemia: Secondary | ICD-10-CM | POA: Diagnosis present

## 2017-02-18 DIAGNOSIS — I2510 Atherosclerotic heart disease of native coronary artery without angina pectoris: Secondary | ICD-10-CM

## 2017-02-18 DIAGNOSIS — I10 Essential (primary) hypertension: Secondary | ICD-10-CM

## 2017-02-18 DIAGNOSIS — I255 Ischemic cardiomyopathy: Secondary | ICD-10-CM | POA: Diagnosis present

## 2017-02-18 DIAGNOSIS — Z888 Allergy status to other drugs, medicaments and biological substances status: Secondary | ICD-10-CM | POA: Diagnosis not present

## 2017-02-18 DIAGNOSIS — Z8551 Personal history of malignant neoplasm of bladder: Secondary | ICD-10-CM | POA: Diagnosis not present

## 2017-02-18 DIAGNOSIS — E784 Other hyperlipidemia: Secondary | ICD-10-CM

## 2017-02-18 DIAGNOSIS — Z8673 Personal history of transient ischemic attack (TIA), and cerebral infarction without residual deficits: Secondary | ICD-10-CM | POA: Diagnosis not present

## 2017-02-18 DIAGNOSIS — I5181 Takotsubo syndrome: Secondary | ICD-10-CM | POA: Diagnosis not present

## 2017-02-18 DIAGNOSIS — Z881 Allergy status to other antibiotic agents status: Secondary | ICD-10-CM | POA: Diagnosis not present

## 2017-02-18 DIAGNOSIS — I214 Non-ST elevation (NSTEMI) myocardial infarction: Secondary | ICD-10-CM | POA: Diagnosis not present

## 2017-02-18 DIAGNOSIS — I25118 Atherosclerotic heart disease of native coronary artery with other forms of angina pectoris: Secondary | ICD-10-CM | POA: Diagnosis not present

## 2017-02-18 DIAGNOSIS — Z79899 Other long term (current) drug therapy: Secondary | ICD-10-CM | POA: Diagnosis not present

## 2017-02-18 DIAGNOSIS — K219 Gastro-esophageal reflux disease without esophagitis: Secondary | ICD-10-CM

## 2017-02-18 DIAGNOSIS — Z885 Allergy status to narcotic agent status: Secondary | ICD-10-CM | POA: Diagnosis not present

## 2017-02-18 DIAGNOSIS — Z8249 Family history of ischemic heart disease and other diseases of the circulatory system: Secondary | ICD-10-CM | POA: Diagnosis not present

## 2017-02-18 DIAGNOSIS — Z87891 Personal history of nicotine dependence: Secondary | ICD-10-CM | POA: Diagnosis not present

## 2017-02-18 DIAGNOSIS — K224 Dyskinesia of esophagus: Secondary | ICD-10-CM | POA: Diagnosis not present

## 2017-02-18 DIAGNOSIS — R748 Abnormal levels of other serum enzymes: Secondary | ICD-10-CM | POA: Diagnosis not present

## 2017-02-18 DIAGNOSIS — K760 Fatty (change of) liver, not elsewhere classified: Secondary | ICD-10-CM | POA: Diagnosis present

## 2017-02-18 DIAGNOSIS — E785 Hyperlipidemia, unspecified: Secondary | ICD-10-CM | POA: Diagnosis not present

## 2017-02-18 DIAGNOSIS — F419 Anxiety disorder, unspecified: Secondary | ICD-10-CM | POA: Diagnosis not present

## 2017-02-18 DIAGNOSIS — R079 Chest pain, unspecified: Secondary | ICD-10-CM | POA: Diagnosis not present

## 2017-02-18 HISTORY — PX: LEFT HEART CATH AND CORONARY ANGIOGRAPHY: CATH118249

## 2017-02-18 LAB — CBC
HCT: 34.1 % — ABNORMAL LOW (ref 36.0–46.0)
Hemoglobin: 11 g/dL — ABNORMAL LOW (ref 12.0–15.0)
MCH: 28 pg (ref 26.0–34.0)
MCHC: 32.3 g/dL (ref 30.0–36.0)
MCV: 86.8 fL (ref 78.0–100.0)
Platelets: 232 10*3/uL (ref 150–400)
RBC: 3.93 MIL/uL (ref 3.87–5.11)
RDW: 14.1 % (ref 11.5–15.5)
WBC: 7 10*3/uL (ref 4.0–10.5)

## 2017-02-18 LAB — COMPREHENSIVE METABOLIC PANEL
ALT: 26 U/L (ref 14–54)
AST: 35 U/L (ref 15–41)
Albumin: 3.8 g/dL (ref 3.5–5.0)
Alkaline Phosphatase: 78 U/L (ref 38–126)
Anion gap: 9 (ref 5–15)
BUN: 9 mg/dL (ref 6–20)
CO2: 24 mmol/L (ref 22–32)
Calcium: 8.8 mg/dL — ABNORMAL LOW (ref 8.9–10.3)
Chloride: 105 mmol/L (ref 101–111)
Creatinine, Ser: 0.62 mg/dL (ref 0.44–1.00)
GFR calc Af Amer: >60 mL/min (ref >60)
GFR calc non Af Amer: >60 mL/min (ref >60)
Glucose, Bld: 110 mg/dL — ABNORMAL HIGH (ref 65–99)
Potassium: 3.8 mmol/L (ref 3.5–5.1)
Sodium: 138 mmol/L (ref 135–145)
Total Bilirubin: 0.5 mg/dL (ref 0.3–1.2)
Total Protein: 6.5 g/dL (ref 6.5–8.1)

## 2017-02-18 LAB — TROPONIN I
Troponin I: 0.72 ng/mL (ref <0.03)
Troponin I: 0.81 ng/mL (ref <0.03)
Troponin I: 1.24 ng/mL (ref <0.03)
Troponin I: 1.64 ng/mL (ref <0.03)

## 2017-02-18 LAB — HEPARIN LEVEL (UNFRACTIONATED): Heparin Unfractionated: 0.31 IU/mL (ref 0.30–0.70)

## 2017-02-18 LAB — PROTIME-INR
INR: 0.97
Prothrombin Time: 12.8 seconds (ref 11.4–15.2)

## 2017-02-18 LAB — HEMOGLOBIN A1C
Hgb A1c MFr Bld: 5.2 % (ref 4.8–5.6)
Mean Plasma Glucose: 102.54 mg/dL

## 2017-02-18 LAB — LIPID PANEL
Cholesterol: 154 mg/dL (ref 0–200)
HDL: 64 mg/dL (ref >40)
LDL Cholesterol: 73 mg/dL (ref 0–99)
Total CHOL/HDL Ratio: 2.4 RATIO
Triglycerides: 84 mg/dL (ref <150)
VLDL: 17 mg/dL (ref 0–40)

## 2017-02-18 LAB — BRAIN NATRIURETIC PEPTIDE: B Natriuretic Peptide: 553.5 pg/mL — ABNORMAL HIGH (ref 0.0–100.0)

## 2017-02-18 LAB — TSH: TSH: 2.526 u[IU]/mL (ref 0.350–4.500)

## 2017-02-18 SURGERY — LEFT HEART CATH AND CORONARY ANGIOGRAPHY
Anesthesia: LOCAL

## 2017-02-18 MED ORDER — MIDAZOLAM HCL 2 MG/2ML IJ SOLN
INTRAMUSCULAR | Status: AC
  Filled 2017-02-18: qty 2

## 2017-02-18 MED ORDER — VERAPAMIL HCL 2.5 MG/ML IV SOLN
INTRAVENOUS | Status: AC
  Filled 2017-02-18: qty 2

## 2017-02-18 MED ORDER — VERAPAMIL HCL 2.5 MG/ML IV SOLN
INTRAVENOUS | Status: DC | PRN
  Administered 2017-02-18: 10 mL via INTRA_ARTERIAL

## 2017-02-18 MED ORDER — HEPARIN (PORCINE) IN NACL 2-0.9 UNIT/ML-% IJ SOLN
INTRAMUSCULAR | Status: AC | PRN
  Administered 2017-02-18: 1000 mL

## 2017-02-18 MED ORDER — MIDAZOLAM HCL 2 MG/2ML IJ SOLN
INTRAMUSCULAR | Status: DC | PRN
Start: 2017-02-18 — End: 2017-02-18
  Administered 2017-02-18 (×2): 1 mg via INTRAVENOUS

## 2017-02-18 MED ORDER — ONDANSETRON HCL 4 MG/2ML IJ SOLN
4.0000 mg | Freq: Four times a day (QID) | INTRAMUSCULAR | Status: DC | PRN
  Administered 2017-02-18 (×2): 4 mg via INTRAVENOUS
  Filled 2017-02-18: qty 2

## 2017-02-18 MED ORDER — FENTANYL CITRATE (PF) 100 MCG/2ML IJ SOLN
50.0000 ug | Freq: Once | INTRAMUSCULAR | Status: AC
  Administered 2017-02-18: 50 ug via INTRAVENOUS
  Filled 2017-02-18: qty 2

## 2017-02-18 MED ORDER — SODIUM CHLORIDE 0.9 % IV SOLN: 250.0000 mL | INTRAVENOUS | Status: DC | PRN

## 2017-02-18 MED ORDER — ESTRADIOL 0.025 MG/24HR TD PTWK: 0.0250 mg | MEDICATED_PATCH | TRANSDERMAL | Status: DC

## 2017-02-18 MED ORDER — OXYCODONE HCL 5 MG PO TABS: 5.0000 mg | ORAL_TABLET | ORAL | Status: DC | PRN

## 2017-02-18 MED ORDER — SODIUM CHLORIDE 0.9% FLUSH: 3.0000 mL | INTRAVENOUS | Status: DC | PRN

## 2017-02-18 MED ORDER — HEPARIN (PORCINE) IN NACL 2-0.9 UNIT/ML-% IJ SOLN
INTRAMUSCULAR | Status: AC
  Filled 2017-02-18: qty 1000

## 2017-02-18 MED ORDER — HEPARIN (PORCINE) IN NACL 100-0.45 UNIT/ML-% IJ SOLN
800.0000 [IU]/h | INTRAMUSCULAR | Status: DC
  Administered 2017-02-18: 750 [IU]/h via INTRAVENOUS
  Filled 2017-02-18: qty 250

## 2017-02-18 MED ORDER — OXYCODONE HCL 5 MG PO TABS
5.0000 mg | ORAL_TABLET | Freq: Four times a day (QID) | ORAL | Status: DC | PRN
  Administered 2017-02-18: 5 mg via ORAL
  Filled 2017-02-18: qty 1

## 2017-02-18 MED ORDER — COQ10 100 MG PO CAPS: 100.0000 mg | ORAL_CAPSULE | Freq: Every day | ORAL | Status: DC

## 2017-02-18 MED ORDER — SODIUM CHLORIDE 0.9 % WEIGHT BASED INFUSION: 1.0000 mL/kg/h | INTRAVENOUS | Status: DC

## 2017-02-18 MED ORDER — TEMAZEPAM 15 MG PO CAPS: 15.0000 mg | ORAL_CAPSULE | ORAL | Status: DC

## 2017-02-18 MED ORDER — SODIUM CHLORIDE 0.9% FLUSH
3.0000 mL | Freq: Two times a day (BID) | INTRAVENOUS | Status: DC
  Administered 2017-02-19 – 2017-02-20 (×3): 3 mL via INTRAVENOUS

## 2017-02-18 MED ORDER — PANTOPRAZOLE SODIUM 40 MG PO TBEC: 40.0000 mg | DELAYED_RELEASE_TABLET | Freq: Every day | ORAL | Status: DC

## 2017-02-18 MED ORDER — POTASSIUM CHLORIDE CRYS ER 20 MEQ PO TBCR
40.0000 meq | EXTENDED_RELEASE_TABLET | Freq: Once | ORAL | Status: AC
  Administered 2017-02-19: 40 meq via ORAL
  Filled 2017-02-18: qty 2

## 2017-02-18 MED ORDER — RISAQUAD PO CAPS
1.0000 | ORAL_CAPSULE | Freq: Every day | ORAL | Status: DC
  Administered 2017-02-19 – 2017-02-20 (×2): 1 via ORAL
  Filled 2017-02-18 (×2): qty 1

## 2017-02-18 MED ORDER — PANTOPRAZOLE SODIUM 40 MG PO TBEC: 80.0000 mg | DELAYED_RELEASE_TABLET | Freq: Every day | ORAL | Status: DC

## 2017-02-18 MED ORDER — FENTANYL CITRATE (PF) 100 MCG/2ML IJ SOLN
25.0000 ug | Freq: Once | INTRAMUSCULAR | Status: AC
  Administered 2017-02-18: 25 ug via INTRAVENOUS
  Filled 2017-02-18: qty 2

## 2017-02-18 MED ORDER — HEPARIN SODIUM (PORCINE) 1000 UNIT/ML IJ SOLN
INTRAMUSCULAR | Status: AC
  Filled 2017-02-18: qty 1

## 2017-02-18 MED ORDER — PANTOPRAZOLE SODIUM 40 MG PO TBEC
40.0000 mg | DELAYED_RELEASE_TABLET | Freq: Every day | ORAL | Status: DC
  Administered 2017-02-18 – 2017-02-20 (×3): 40 mg via ORAL
  Filled 2017-02-18 (×3): qty 1

## 2017-02-18 MED ORDER — SODIUM CHLORIDE 0.9% FLUSH
3.0000 mL | Freq: Two times a day (BID) | INTRAVENOUS | Status: DC
  Administered 2017-02-18 – 2017-02-20 (×2): 3 mL via INTRAVENOUS

## 2017-02-18 MED ORDER — MORPHINE SULFATE (PF) 10 MG/ML IV SOLN
2.0000 mg | INTRAVENOUS | Status: DC | PRN
  Administered 2017-02-18: 2 mg via INTRAVENOUS
  Filled 2017-02-18: qty 0.2

## 2017-02-18 MED ORDER — LIDOCAINE HCL (PF) 1 % IJ SOLN
INTRAMUSCULAR | Status: DC | PRN
  Administered 2017-02-18: 1 mL

## 2017-02-18 MED ORDER — ASPIRIN 81 MG PO CHEW: 81.0000 mg | CHEWABLE_TABLET | Freq: Every day | ORAL | Status: DC

## 2017-02-18 MED ORDER — ONDANSETRON HCL 4 MG/2ML IJ SOLN
INTRAMUSCULAR | Status: AC
  Filled 2017-02-18: qty 2

## 2017-02-18 MED ORDER — SODIUM CHLORIDE 0.9 % WEIGHT BASED INFUSION: 3.0000 mL/kg/h | INTRAVENOUS | Status: AC

## 2017-02-18 MED ORDER — ONDANSETRON HCL 4 MG/2ML IJ SOLN
4.0000 mg | Freq: Once | INTRAMUSCULAR | Status: AC
  Administered 2017-02-18: 4 mg via INTRAVENOUS
  Filled 2017-02-18: qty 2

## 2017-02-18 MED ORDER — QUETIAPINE FUMARATE 25 MG PO TABS
25.0000 mg | ORAL_TABLET | Freq: Every day | ORAL | Status: DC
  Administered 2017-02-18 – 2017-02-19 (×2): 25 mg via ORAL
  Filled 2017-02-18 (×2): qty 1

## 2017-02-18 MED ORDER — ATORVASTATIN CALCIUM 80 MG PO TABS
80.0000 mg | ORAL_TABLET | Freq: Every day | ORAL | Status: DC
  Administered 2017-02-18 – 2017-02-19 (×2): 80 mg via ORAL
  Filled 2017-02-18 (×2): qty 1

## 2017-02-18 MED ORDER — ACYCLOVIR 400 MG PO TABS
400.0000 mg | ORAL_TABLET | Freq: Two times a day (BID) | ORAL | Status: DC
  Administered 2017-02-18 – 2017-02-20 (×4): 400 mg via ORAL
  Filled 2017-02-18 (×5): qty 1

## 2017-02-18 MED ORDER — ASPIRIN 81 MG PO CHEW: 81.0000 mg | CHEWABLE_TABLET | ORAL | Status: DC

## 2017-02-18 MED ORDER — IBUPROFEN 600 MG PO TABS: 600.0000 mg | ORAL_TABLET | Freq: Four times a day (QID) | ORAL | Status: DC | PRN

## 2017-02-18 MED ORDER — MORPHINE SULFATE (PF) 4 MG/ML IV SOLN
2.0000 mg | Freq: Once | INTRAVENOUS | Status: AC
  Administered 2017-02-18: 2 mg via INTRAVENOUS
  Filled 2017-02-18: qty 1

## 2017-02-18 MED ORDER — FENTANYL CITRATE (PF) 100 MCG/2ML IJ SOLN
INTRAMUSCULAR | Status: DC | PRN
  Administered 2017-02-18: 25 ug via INTRAVENOUS

## 2017-02-18 MED ORDER — POLYETHYLENE GLYCOL 3350 17 G PO PACK
17.0000 g | PACK | Freq: Every day | ORAL | Status: DC
  Administered 2017-02-18: 17 g via ORAL
  Filled 2017-02-18 (×3): qty 1

## 2017-02-18 MED ORDER — HEPARIN SODIUM (PORCINE) 1000 UNIT/ML IJ SOLN
INTRAMUSCULAR | Status: DC | PRN
  Administered 2017-02-18: 3000 [IU] via INTRAVENOUS

## 2017-02-18 MED ORDER — FENTANYL CITRATE (PF) 100 MCG/2ML IJ SOLN
INTRAMUSCULAR | Status: AC
  Filled 2017-02-18: qty 2

## 2017-02-18 MED ORDER — VITAMIN D 1000 UNITS PO TABS
1000.0000 [IU] | ORAL_TABLET | Freq: Every day | ORAL | Status: DC
  Administered 2017-02-19 – 2017-02-20 (×2): 1000 [IU] via ORAL
  Filled 2017-02-18 (×2): qty 1

## 2017-02-18 MED ORDER — LOPERAMIDE HCL 2 MG PO CAPS: 2.0000 mg | ORAL_CAPSULE | ORAL | Status: DC | PRN

## 2017-02-18 MED ORDER — MORPHINE SULFATE (PF) 4 MG/ML IV SOLN
INTRAVENOUS | Status: AC
  Filled 2017-02-18: qty 1

## 2017-02-18 MED ORDER — SODIUM CHLORIDE 0.9 % WEIGHT BASED INFUSION: 1.0000 mL/kg/h | INTRAVENOUS | Status: AC

## 2017-02-18 MED ORDER — SODIUM CHLORIDE 0.9 % IV SOLN: 500.0000 mL | INTRAVENOUS | Status: DC

## 2017-02-18 MED ORDER — ASPIRIN 81 MG PO TABS: 81.0000 mg | ORAL_TABLET | Freq: Every day | ORAL | Status: DC

## 2017-02-18 MED ORDER — CALCIUM CARBONATE ANTACID 500 MG PO CHEW
1.0000 | CHEWABLE_TABLET | ORAL | Status: DC | PRN
  Administered 2017-02-18: 200 mg via ORAL
  Filled 2017-02-18: qty 1

## 2017-02-18 MED ORDER — PROCHLORPERAZINE EDISYLATE 5 MG/ML IJ SOLN
5.0000 mg | Freq: Once | INTRAMUSCULAR | Status: AC
  Administered 2017-02-18: 5 mg via INTRAVENOUS
  Filled 2017-02-18: qty 2

## 2017-02-18 MED ORDER — RANOLAZINE ER 500 MG PO TB12
500.0000 mg | ORAL_TABLET | Freq: Two times a day (BID) | ORAL | Status: DC
  Administered 2017-02-18 – 2017-02-20 (×4): 500 mg via ORAL
  Filled 2017-02-18 (×4): qty 1

## 2017-02-18 MED ORDER — ALPRAZOLAM 0.25 MG PO TABS
1.0000 mg | ORAL_TABLET | Freq: Two times a day (BID) | ORAL | Status: DC | PRN
  Administered 2017-02-18: 1 mg via ORAL
  Filled 2017-02-18 (×2): qty 4

## 2017-02-18 MED ORDER — IOPAMIDOL (ISOVUE-370) INJECTION 76%
INTRAVENOUS | Status: AC
  Filled 2017-02-18: qty 100

## 2017-02-18 MED ORDER — IOPAMIDOL (ISOVUE-370) INJECTION 76%
INTRAVENOUS | Status: DC | PRN
  Administered 2017-02-18: 80 mL via INTRA_ARTERIAL

## 2017-02-18 MED ORDER — DICYCLOMINE HCL 10 MG PO CAPS: 10.0000 mg | ORAL_CAPSULE | Freq: Three times a day (TID) | ORAL | Status: DC | PRN

## 2017-02-18 MED ORDER — ESOMEPRAZOLE MAGNESIUM 40 MG PO PACK: 40.0000 mg | PACK | Freq: Every day | ORAL | Status: DC

## 2017-02-18 MED ORDER — DIAZEPAM 2 MG PO TABS: 2.0000 mg | ORAL_TABLET | Freq: Two times a day (BID) | ORAL | Status: DC | PRN

## 2017-02-18 MED ORDER — ASPIRIN EC 81 MG PO TBEC
81.0000 mg | DELAYED_RELEASE_TABLET | Freq: Every day | ORAL | Status: DC
  Administered 2017-02-19 – 2017-02-20 (×2): 81 mg via ORAL
  Filled 2017-02-18 (×2): qty 1

## 2017-02-18 MED ORDER — ACETAMINOPHEN 325 MG PO TABS
650.0000 mg | ORAL_TABLET | ORAL | Status: DC | PRN
  Administered 2017-02-19: 650 mg via ORAL
  Filled 2017-02-18: qty 2

## 2017-02-18 SURGICAL SUPPLY — 15 items
CATH EXPO 5F FL3.5 (CATHETERS) ×1 IMPLANT
CATH INFINITI JR4 5F (CATHETERS) ×1 IMPLANT
CATH LAUNCHER 5F EBU3.0 (CATHETERS) IMPLANT
CATHETER LAUNCHER 5F EBU3.0 (CATHETERS) ×2
COVER PRB 48X5XTLSCP FOLD TPE (BAG) IMPLANT
COVER PROBE 5X48 (BAG) ×2
DEVICE RAD TR BAND REGULAR (VASCULAR PRODUCTS) ×1 IMPLANT
GLIDESHEATH SLEND A-KIT 6F 22G (SHEATH) ×1 IMPLANT
GUIDEWIRE INQWIRE 1.5J.035X260 (WIRE) IMPLANT
INQWIRE 1.5J .035X260CM (WIRE) ×2
KIT HEART LEFT (KITS) ×2 IMPLANT
PACK CARDIAC CATHETERIZATION (CUSTOM PROCEDURE TRAY) ×2 IMPLANT
TRANSDUCER W/STOPCOCK (MISCELLANEOUS) ×2 IMPLANT
TUBING CIL FLEX 10 FLL-RA (TUBING) ×2 IMPLANT
WIRE HI TORQ VERSACORE-J 145CM (WIRE) ×1 IMPLANT

## 2017-02-18 NOTE — H&P (Signed)
CARDIOLOGY INPATIENT HISTORY AND PHYSICAL EXAMINATION NOTE  Patient ID: ZAYLIN PISTILLI MRN: [id number], DOB/AGE: 01/16/1951   Admit date:65/03/2017    02/17/2017   Primary Physician: Leeroy Cha, MD Primary Cardiologist: Pernell Dupre, MD  Reason for admission: chest pain  HPI: This is a 66 y.o.white female with known stable angina and moderate midLAD CAD, bladder cancer (s/p BCG therapy last yr), esophageal spasm, GERD, HTN who presented with chest pain to the ED.  CP was sudden in onset, started around 8 PM after her work out. It was substernal in location, non radiating, got worse when she went upstairs and took shower, 10/10 in intensity. She called in and I recommended her to go to the ED via EMS. She said that she did not have any similar pain recently. Pain started at rest but got worse with exertion. Associated with nausea and palpitations but not associated with CHF symptoms, syncope, presyncope or diaphoresis. Improved with NTG but did not return to baseline. In ED, trop was 0.2. She was given aspirin and IV heparin along with NTG and morphine which helped the pain. Patient is very active physically. Patient did not take NTG at home.  Of note, she did not have PCI in the past. She denies history of MI. She has a close family history of MI. She is followed by cardiologist Dr. Daneen Schick. She took 2 nitroglycerin at home with some relief of her pain. She received a third dose of NTG by EMS and then some morphine which helped her pain tremendously. She was chest pain-free and on arrival to the emergency department and now her pain is slightly coming back at my evaluation. She took 324 mg of aspirin prior to arrival. She was previously diagnosed with bladder cancer and underwent BCG treatment previously. She denies fevers, coughing, shortness of breath, cough, hemoptysis, lightheadedness, dizziness, syncope, leg pain, leg swollen, recent long travel, history of DVT or PE.   Problem List: Past  Medical History:  Diagnosis Date  . Anxiety    Psychiatry Dr. Stan Head, concerns for memory loss, anxiety  . Bladder tumor   . Cancer ([place])    bladder CA  . Chronic chest pain   . Coronary atherosclerosis CARDIOLOGIST-  DR Daneen Schick   Cath 11/2006 demonstrating moderate obstructive coronary disease with 70-80% septal perforator #1, 50-70% first diagonal, 80% third diagonal, 50% mid and distal LAD & heavy calcification throughout all 3 coronaries. LVF normal.  . Esophageal motility disorder   . Essential hypertension, benign   . Fatty liver 04/28/12  . GERD (gastroesophageal reflux disease)   . H/O esophageal spasm   . Heart murmur   . History of esophageal spasm    takes ranexa  . History of TIA (transient ischemic attack)    2011--  no residuals  . Hyperlipidemia   . IBS (irritable bowel syndrome)    Diarrhea predominent, esophageal spasm severe, GERD - Dr. Elicia Lamp  . Interstitial cystitis    Dr. Gaynelle Arabian  . Memory loss    Psychiatry Dr. Stan Head, concerns for memory loss, anxiety  . PONV (postoperative nausea and vomiting)    AND URINARY RETENTION  . Sensation of pressure in bladder area   . TMJ (temporomandibular joint syndrome)   . Wears glasses     Past Surgical History:  Procedure Laterality Date  . ABDOMINAL HYSTERECTOMY  AGE 88 -- 1989  . APPENDECTOMY  1971   AND OVARIAN CYSTECTOMY  . BENIGN EXCISIONAL BREAST BX  1996  . CARDIAC  CATHETERIZATION  11-28-2006   DR Daneen Schick    moderate cad/  70-80% septal perforator #1, 50-70% first diagonal, 80% third diagonal, 50% mid and distal LAD & heavy calcification throughout all 3 coronaries. LVF normal.  . CARDIAC CATHETERIZATION  03-26-2012   DR Daneen Schick   patent CFX,  RCA,  & pLAD/  mLAD >50% to <70% a focal eccentric region that overlaps the third diagonal/  90% diagonal ostial and unchanged from prior study /   ef 65%  . CYSTO WITH HYDRODISTENSION N/A 01/14/2014   Procedure:  CYSTOSCOPY/HYDRODISTENSION/INSERTION OF MARCAINE AND PYRIDUIM INTO BLADDER/INJECTION OF MARCAINE AND KENALOG into sub trigone space ;  Surgeon: Ailene Rud, MD;  Location: [facility];  Service: Urology;  Laterality: N/A;  . CYSTO/  HYDRODISTENTION/  INSTILLATION THERAPY  03-12-2010  . ESOPHAGOGASTRODUODENOSCOPY  04/30/2012   Procedure: ESOPHAGOGASTRODUODENOSCOPY (EGD);  Surgeon: Lafayette Dragon, MD;  Location: [facility];  Service: Endoscopy;  Laterality: N/A;  . LUMBAR [place]  . NEGATIVE SLEEP STUDY  2010  . SHOULDER ARTHROSCOPY Left 12-23-2011  . TRANSTHORACIC ECHOCARDIOGRAM  04-21-2012   GRADE I DIASTOLIC DYSFUNCTION/  EF 60-65%/  MILD MR  &  TR  . TRANSURETHRAL RESECTION OF BLADDER TUMOR Right 01/03/2015   Procedure: TRANSURETHRAL RESECTION OF BLADDER TUMOR (TURBT);  Surgeon: Carolan Clines, MD;  Location: [facility];  Service: Urology;  Laterality: Right;     Allergies:  Allergies  Allergen Reactions  . Clindamycin/Lincomycin Other (See Comments)    Pt not sure of reaction   . Doxycycline Nausea Only and Nausea And Vomiting  . Escitalopram Oxalate Nausea Only  . Hydrocodone Itching  . Hyoscyamine Itching  . Macrodantin Other (See Comments)    Flushed, hot  . Trazodone Nausea Only  . Trazodone And Nefazodone Nausea Only  . Trimethoprim Other (See Comments)    unknown   . Levsin [Hyoscyamine Sulfate] Hives, Swelling, Anxiety and Photosensitivity    Vision issues.  . Lincomycin Rash    Reaction unknown  . Nitrofurantoin Rash    Flushed, hot  . Nitrofurantoin Macrocrystal Anxiety    Flushing, hot face and ears     Home Medications Current Facility-Administered Medications  Medication Dose Route Frequency Provider Last Rate Last Dose  . 0.9 %  sodium chloride infusion  500 mL Intravenous Continuous Nandigam, Kavitha V, MD      . 0.9 %  sodium chloride infusion  500 mL Intravenous Continuous Geralynn Ochs T, MD        . acyclovir (ZOVIRAX) tablet 400 mg  400 mg Oral BID Geralynn Ochs T, MD      . aspirin EC tablet 81 mg  81 mg Oral Daily Geralynn Ochs T, MD      . atorvastatin (LIPITOR) tablet 80 mg  80 mg Oral q1800 Geralynn Ochs T, MD      . calcium carbonate (TUMS - dosed in mg elemental calcium) chewable tablet 200 mg of elemental calcium  1 tablet Oral PRN Geralynn Ochs T, MD      . cholecalciferol (VITAMIN D) tablet 1,000 Units  1,000 Units Oral Daily Geralynn Ochs T, MD      . CoQ10 CAPS 100 mg  100 mg Oral Daily Geralynn Ochs T, MD      . diazepam (VALIUM) tablet 2 mg  2 mg Oral BID PRN Geralynn Ochs T, MD      . dicyclomine (BENTYL) capsule 10 mg  10 mg Oral TID PRN  Geralynn Ochs T, MD      . esomeprazole (NEXIUM) suspension 40 mg  40 mg Oral QAC breakfast Geralynn Ochs T, MD      . estradiol [facility] - Dosed in mg/24 hr) patch 0.025 mg  0.025 mg Transdermal Weekly Geralynn Ochs T, MD      . heparin ADULT infusion 100 units/mL (25000 units/224mL sodium chloride 0.45%)  700 Units/hr Intravenous Continuous Honor Loh, RPH 7 mL/hr at 02/18/17 0012 700 Units/hr at 02/18/17 0012  . ibuprofen (ADVIL,MOTRIN) tablet 600 mg  600 mg Oral Q6H PRN Geralynn Ochs T, MD      . loperamide (IMODIUM A-D) tablet 2 mg  2 mg Oral PRN Geralynn Ochs T, MD      . nitroGLYCERIN (NITROSTAT) SL tablet 0.4 mg  0.4 mg Sublingual Q5 Min x 3 PRN Waynetta Pean, PA-C      . nitroGLYCERIN 50 mg in dextrose 5 % 250 mL (0.2 mg/mL) infusion  0-200 mcg/min Intravenous Titrated Waynetta Pean, PA-C 3 mL/hr at 02/18/17 0352 10 mcg/min at 02/18/17 0352  . oxyCODONE (Oxy IR/ROXICODONE) immediate release tablet 5 mg  5 mg Oral PRN Geralynn Ochs T, MD      . pantoprazole (PROTONIX) EC tablet 40 mg  40 mg Oral Daily Geralynn Ochs T, MD      . PROBIOTIC ACIDOPHILUS BIOBEADS CAPS 1 capsule  1 capsule Oral Daily Geralynn Ochs T, MD      . QUEtiapine (SEROQUEL) tablet 25 mg  25 mg Oral QHS Geralynn Ochs T, MD      .  ranolazine (RANEXA) 12 hr tablet 500 mg  500 mg Oral BID Geralynn Ochs T, MD      . temazepam (RESTORIL) capsule 15 mg  15 mg Oral Weekly Flossie Dibble, MD       Current Outpatient Prescriptions  Medication Sig Dispense Refill  . acyclovir (ZOVIRAX) 400 MG tablet Take 400 mg by mouth 2 (two) times daily.    Marland Kitchen aspirin 81 MG tablet Take 81 mg by mouth at bedtime.     Marland Kitchen atorvastatin (LIPITOR) 20 MG tablet Take 20 mg by mouth at bedtime.     . Cholecalciferol (VITAMIN D) 1000 UNITS capsule Take 1,000 Units by mouth daily.    Marland Kitchen dicyclomine (BENTYL) 10 MG capsule Take 10 mg by mouth 3 (three) times daily as needed for spasms (IBS).    Marland Kitchen diltiazem 2 % GEL Apply 1 application topically as needed. For rectal spasm 30 g 1  . esomeprazole (NEXIUM) 40 MG packet Take 40 mg by mouth daily before breakfast. 30 each 6  . estradiol (CLIMARA - DOSED IN MG/24 HR) 0.025 mg/24hr patch Place 0.025 mg onto the skin once a week.    Marland Kitchen ibuprofen (ADVIL,MOTRIN) 200 MG tablet Take 600 mg by mouth every 6 (six) hours as needed for headache or moderate pain.    Marland Kitchen loperamide (IMODIUM A-D) 2 MG tablet Take 2 mg by mouth as needed for diarrhea or loose stools.    Marland Kitchen LORazepam (ATIVAN) 0.5 MG tablet take 1 tablet by mouth three times a day if needed (Patient taking differently: take 1 tablet by mouth three times a day if needed esophageal spasms) 30 tablet 0  . nitroGLYCERIN (NITROSTAT) 0.4 MG SL tablet place 1 tablet under the tongue if needed every 5 minutes for chest pain for 3 doses IF NO RELIEF AFTER 3RD DOSE CALL PRESCRIBER OR 911. 75 tablet 1  . oxyCODONE (OXY IR/ROXICODONE) 5 MG immediate  release tablet Take 5 mg by mouth as needed for moderate pain.     . Probiotic Product (PROBIOTIC ACIDOPHILUS) CAPS Take 1 capsule by mouth daily.    . QUEtiapine (SEROQUEL) 25 MG tablet Take 25 mg by mouth at bedtime. For sleep  (pt takes seroquel OR temazepam for sleep)  0  . ranolazine (RANEXA) 500 MG 12 hr tablet Take 1 tablet  (500 mg total) by mouth 2 (two) times daily. 180 tablet 3  . temazepam (RESTORIL) 15 MG capsule Take 15 mg by mouth See admin instructions. Approximately twice weekly for sleep instead of seroquel       Family History  Problem Relation Age of Onset  . Heart disease Mother   . Colon polyps Mother   . Alzheimer's disease Mother   . Dementia Mother   . CAD Mother   . Heart disease Father   . Diverticulosis Father   . CAD Father        in his 28s  . Heart disease Brother   . Osteoarthritis Other        Multiple family members  . Obesity Other        Multiple family members  . Depression Other        Multiple family members  . Colon cancer Neg Hx      Social History   Social History  . Marital status: Married    Spouse name: N/A  . Number of children: 2  . Years of education: N/A   Occupational History  . caregiver    Social History Main Topics  . Smoking status: Former Smoker    Years: 10.00    Types: Cigarettes    Quit date: 06/10/1973  . Smokeless tobacco: Never Used  . Alcohol use 1.2 oz/week    2 Glasses of wine per week     Comment: social  . Drug use: No  . Sexual activity: Yes   Other Topics Concern  . Not on file   Social History Narrative  . No narrative on file     Review of Systems: General: negative for chills, fever, night sweats or weight changes.  Cardiovascular: chest pain, dyspnea negative for dyspnea on exertion, edema, orthopnea, palpitations, paroxysmal nocturnal dyspnea  Dermatological: negative for rash Respiratory: negative for cough or wheezing Urologic: negative for hematuria Abdominal: negative for nausea, vomiting, diarrhea, bright red blood per rectum, melena, or hematemesis Neurologic: negative for visual changes, syncope, or dizziness Endocrine: no diabetes, no hypothyroidism Immunological: no lymph adenopathy Psych: non homicidal/suicidal  Physical Exam: Vitals: BP (!) 126/96   Pulse (!) 104   Temp 98 F (36.7 C) (Oral)    Resp 19   Ht 5' 2.5" (1.588 m)   Wt 59 kg (130 lb)   SpO2 95%   BMI 23.40 kg/m  General: not in acute distress Neck: JVP flat, neck supple Heart: regular rate and rhythm, S1, S2, no murmurs  Lungs: CTAB  GI: non tender, non distended, bowel sounds present Extremities: no edema Neuro: AAO x 3  Psych: normal affect, no anxiety   Labs:   Results for orders placed or performed during the hospital encounter of 02/17/17 (from the past 24 hour(s))  Comprehensive metabolic panel     Status: Abnormal   Collection Time: 02/17/17 10:44 PM  Result Value Ref Range   Sodium 140 135 - 145 mmol/L   Potassium 3.4 (L) 3.5 - 5.1 mmol/L   Chloride 109 101 - 111 mmol/L   CO2  25 22 - 32 mmol/L   Glucose, Bld 98 65 - 99 mg/dL   BUN 11 6 - 20 mg/dL   Creatinine, Ser 0.76 0.44 - 1.00 mg/dL   Calcium 8.5 (L) 8.9 - 10.3 mg/dL   Total Protein 6.1 (L) 6.5 - 8.1 g/dL   Albumin 3.7 3.5 - 5.0 g/dL   AST 25 15 - 41 U/L   ALT 25 14 - 54 U/L   Alkaline Phosphatase 77 38 - 126 U/L   Total Bilirubin 0.3 0.3 - 1.2 mg/dL   GFR calc non Af Amer >60 >60 mL/min   GFR calc Af Amer >60 >60 mL/min   Anion gap 6 5 - 15  CBC with Differential     Status: Abnormal   Collection Time: 02/17/17 10:44 PM  Result Value Ref Range   WBC 4.8 4.0 - 10.5 K/uL   RBC 3.68 (L) 3.87 - 5.11 MIL/uL   Hemoglobin 10.5 (L) 12.0 - 15.0 g/dL   HCT 31.9 (L) 36.0 - 46.0 %   MCV 86.7 78.0 - 100.0 fL   MCH 28.5 26.0 - 34.0 pg   MCHC 32.9 30.0 - 36.0 g/dL   RDW 13.9 11.5 - 15.5 %   Platelets 208 150 - 400 K/uL   Neutrophils Relative % 64 %   Neutro Abs 3.1 1.7 - 7.7 K/uL   Lymphocytes Relative 25 %   Lymphs Abs 1.2 0.7 - 4.0 K/uL   Monocytes Relative 10 %   Monocytes Absolute 0.5 0.1 - 1.0 K/uL   Eosinophils Relative 1 %   Eosinophils Absolute 0.1 0.0 - 0.7 K/uL   Basophils Relative 0 %   Basophils Absolute 0.0 0.0 - 0.1 K/uL  I-stat troponin, ED     Status: Abnormal   Collection Time: 02/17/17 11:08 PM  Result Value Ref  Range   Troponin i, poc 0.17 (HH) 0.00 - 0.08 ng/mL   Comment NOTIFIED PHYSICIAN    Comment 3          Troponin I     Status: Abnormal   Collection Time: 02/18/17 12:03 AM  Result Value Ref Range   Troponin I 0.72 (HH) <0.03 ng/mL     Radiology/Studies: Dg Chest 2 View  Result Date: 02/17/2017 CLINICAL DATA:  Mid chest pain with elevated blood pressure tonight. EXAM: CHEST  2 VIEW COMPARISON:  08/05/2015 FINDINGS: Mild hyperinflation likely representing emphysema. Normal heart size and pulmonary vascularity. No focal airspace disease or consolidation in the lungs. No blunting of costophrenic angles. No pneumothorax. Mediastinal contours appear intact. Mild degenerative changes in the spine. IMPRESSION: Emphysematous changes in the lungs. No evidence of active pulmonary disease. Electronically Signed   By: Lucienne Capers M.D.   On: 02/17/2017 23:28    EKG: NSR, no ST T wave changes  Echo: 04/21/2012 - Left ventricle: The cavity size was normal. Systolic function was normal. The estimated ejection fraction was in the range of 60% to 65%. Wall motion was normal; there were no regional wall motion abnormalities. Doppler parameters are consistent with abnormal left ventricular relaxation (grade 1 diastolic dysfunction). - Mitral valve: Mild regurgitation.  Exercise tolerance test 11/12/2016  Blood pressure demonstrated a normal response to exercise.  There was no ST segment deviation noted during stress.  7 minutes and 13 seconds of exercise him a good effort with no chest pain  Low risk exercise treadmill test with no electrocardiographic evidence of ischemia  Cardiac cath: 03/26/2012 Widely patent circumflex, RCA, and proximal LAD. The mid  LAD contains greater than 50% stenosis to less than 70 a focal eccentric region that overlaps the origin of the third diagonal. The diagonal contains a 90% ostial narrowing and is unchanged from the prior study.  Medical decision  making:  Discussed care with the patient Discussed care with the physician on the phone Reviewed labs and imaging personally Reviewed prior records  ASSESSMENT AND PLAN:  This is a 66 y.o. female with known moderate history of CAD with ongoing stable angina presented with NSTEMI.    Active Problems:   NSTEMI (non-ST elevated myocardial infarction) (HCC)  NSTEMI Cycle troponin, serial EKGs prn, lipid panel, TSH, HbA1c, echocardiogram in the AM IV heparin, aspirin, increased lipitor to 80 mg from 20 mg, Consider cardiac catheterization  May consider discussing to hold estradiol that she is taking as well as ibuprofen 2/2 heart disease  Hyperlipidemia - increased dose of lipitor to 80 mg daily, lipid panel ordered  GERD - continue PPI, consider GI follow up as outpatient  Hypertension essential - not on bp meds, has been running low on the BP in the recent past     Signed, Flossie Dibble, MD MS 02/18/2017, 7:19 AM

## 2017-02-18 NOTE — Interval H&P Note (Signed)
Cath Lab Visit (complete for each Cath Lab visit)  Clinical Evaluation Leading to the Procedure:   ACS: Yes.    Non-ACS:    Anginal Classification: CCS IV  Anti-ischemic medical therapy: Maximal Therapy (2 or more classes of medications)  Non-Invasive Test Results: No non-invasive testing performed  Prior CABG: No previous CABG      History and Physical Interval Note:  02/18/2017 8:57 AM  [name]  has presented today for surgery, with the diagnosis of cp  The various methods of treatment have been discussed with the patient and family. After consideration of risks, benefits and other options for treatment, the patient has consented to  Procedure(s): LEFT HEART CATH AND CORONARY ANGIOGRAPHY (N/A) as a surgical intervention .  The patient's history has been reviewed, patient examined, no change in status, stable for surgery.  I have reviewed the patient's chart and labs.  Questions were answered to the patient's satisfaction.     Belva Crome III

## 2017-02-18 NOTE — H&P (View-Only) (Signed)
Pt admitted with NSTEMI. Elevated troponin. Ongoing chest pain. Is not tolerating higher doses of IV NTG. Known to have mid LAD stenosis of 50-70% by cath in 2013. She needs a cath. With ongoing pain, will try to add on for cath this am. Risks and benefits reviewed.   Lauree Chandler 02/18/2017 8:14 AM

## 2017-02-18 NOTE — Progress Notes (Signed)
ANTICOAGULATION CONSULT NOTE - Initial Consult  Pharmacy Consult for heparin  Indication: chest pain/ACS  Allergies  Allergen Reactions  . Clindamycin/Lincomycin Other (See Comments)    Pt not sure of reaction   . Doxycycline Nausea Only and Nausea And Vomiting  . Escitalopram Oxalate Nausea Only  . Hydrocodone Itching  . Hyoscyamine Itching  . Macrodantin Other (See Comments)    Flushed, hot  . Trazodone Nausea Only  . Trazodone And Nefazodone Nausea Only  . Trimethoprim Other (See Comments)    unknown   . Levsin [Hyoscyamine Sulfate] Hives, Swelling, Anxiety and Photosensitivity    Vision issues.  . Lincomycin Rash    Reaction unknown  . Nitrofurantoin Rash    Flushed, hot  . Nitrofurantoin Macrocrystal Anxiety    Flushing, hot face and ears    Patient Measurements: Height: 5' 2.5" (158.8 cm) Weight: 131 lb 1.6 oz (59.5 kg) IBW/kg (Calculated) : 51.25 Heparin Dosing Weight: 59 kg   Vital Signs: Temp: 98 F (36.7 C) (09/11 1249) Temp Source: Oral (09/11 1249) BP: 101/70 (09/11 1249) Pulse Rate: 77 (09/11 1225)  Labs:  Recent Labs  02/17/17 2244 02/18/17 0003 02/18/17 0824 02/18/17 1306  HGB 10.5*  --  11.0*  --   HCT 31.9*  --  34.1*  --   PLT 208  --  232  --   LABPROT  --   --  12.8  --   INR  --   --  0.97  --   HEPARINUNFRC  --   --  0.31  --   CREATININE 0.76  --  0.62  --   TROPONINI  --  0.72* 1.64* 1.24*    Estimated Creatinine Clearance: 56.8 mL/min (by C-G formula based on SCr of 0.62 mg/dL).   Medical History: Past Medical History:  Diagnosis Date  . Anxiety    Psychiatry Dr. Stan Head, concerns for memory loss, anxiety  . Bladder tumor   . Cancer ([place])    bladder CA  . Chronic chest pain   . Coronary atherosclerosis CARDIOLOGIST-  DR Daneen Schick   Cath 11/2006 demonstrating moderate obstructive coronary disease with 70-80% septal perforator #1, 50-70% first diagonal, 80% third diagonal, 50% mid and distal LAD & heavy  calcification throughout all 3 coronaries. LVF normal.  . Esophageal motility disorder   . Essential hypertension, benign   . Fatty liver 04/28/12  . GERD (gastroesophageal reflux disease)   . H/O esophageal spasm   . Heart murmur   . History of esophageal spasm    takes ranexa  . History of TIA (transient ischemic attack)    2011--  no residuals  . Hyperlipidemia   . IBS (irritable bowel syndrome)    Diarrhea predominent, esophageal spasm severe, GERD - Dr. Elicia Lamp  . Interstitial cystitis    Dr. Gaynelle Arabian  . Memory loss    Psychiatry Dr. Stan Head, concerns for memory loss, anxiety  . PONV (postoperative nausea and vomiting)    AND URINARY RETENTION  . Sensation of pressure in bladder area   . TMJ (temporomandibular joint syndrome)   . Wears glasses    Assessment: 66 yo female admitted with chest pain. Patient is now s/p cardiac cath  With noted calcification to left main and LAD.  Rec med mgmt.  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  EF 25-35%.  Pharmacy consulted to restart heparin post cath for med mgmt.  Sheath removed at 0930 and TR band placed.  Pt was previously  at low end of goal on heparin at 700 units/hr.  Goal of Therapy:  Heparin level 0.3-0.7 units/ml Monitor platelets by anticoagulation protocol: Yes   Plan:  Sheath removed at 0930 and TR band placed. Start heparin gtt at 750 units/hr at 1730 Heparin level in 8 hrs Daily heparin level and CBC Follow-up LOT of heparin Monitor for s/s bleeding    [employer], Pharm.D., BCPS Clinical Pharmacist Pager: [phone] Clinical phone for 02/18/2017 from 8:30-4:00 is [phone]. After 4pm, please call Main Rx [phone]) for assistance. 02/18/2017 2:42 PM

## 2017-02-18 NOTE — Progress Notes (Signed)
Pt admitted with NSTEMI. Elevated troponin. Ongoing chest pain. Is not tolerating higher doses of IV NTG. Known to have mid LAD stenosis of 50-70% by cath in 2013. She needs a cath. With ongoing pain, will try to add on for cath this am. Risks and benefits reviewed.   Lauree Chandler 02/18/2017 8:14 AM

## 2017-02-18 NOTE — ED Notes (Signed)
Cardiology at bedside, reports will be going to cath lab.

## 2017-02-18 NOTE — Progress Notes (Signed)
Patient c/o nausea and mid chest tightness throughout the shift. Refused morphine due to stating "it might have made her sick" and no relief from Oxy IR. Provider was paged and orders were given for one time dose of 50 mcg of fentanyl at 1607 and extra one time dose of Zofran at 1610. Was relived for a short period of time. Patient had one episode of vomiting 650 ml of emesis. Patient  started having same mid chest pressure and nausea. On call NP paged. Was given orders to obtain EKG, give additional dose of Zofran and wait 30 minutes and give 25 mcg of fentanyl. Will continue to monitor.

## 2017-02-18 NOTE — CV Procedure (Signed)
   Coronary anatomy is very similar to 5 years ago. Heavy calcification from the left main into the mid to distal LAD, eccentric ostial 80-90% first diagonal, 50% LAD proximal, 60-70% mid to distal LAD.  Widely patent circumflex and right coronary.  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 Alternative explanation is ischemia in the diagonal territory but I do not believe that matches a wall motion abnormality.  Recommend medical therapy

## 2017-02-19 ENCOUNTER — Inpatient Hospital Stay (HOSPITAL_COMMUNITY): Payer: Medicare Other

## 2017-02-19 DIAGNOSIS — R079 Chest pain, unspecified: Secondary | ICD-10-CM

## 2017-02-19 DIAGNOSIS — R748 Abnormal levels of other serum enzymes: Secondary | ICD-10-CM

## 2017-02-19 DIAGNOSIS — I5181 Takotsubo syndrome: Secondary | ICD-10-CM

## 2017-02-19 LAB — CBC
HCT: 31.2 % — ABNORMAL LOW (ref 36.0–46.0)
Hemoglobin: 10 g/dL — ABNORMAL LOW (ref 12.0–15.0)
MCH: 27.8 pg (ref 26.0–34.0)
MCHC: 32.1 g/dL (ref 30.0–36.0)
MCV: 86.7 fL (ref 78.0–100.0)
Platelets: 212 10*3/uL (ref 150–400)
RBC: 3.6 MIL/uL — ABNORMAL LOW (ref 3.87–5.11)
RDW: 14.1 % (ref 11.5–15.5)
WBC: 6.2 10*3/uL (ref 4.0–10.5)

## 2017-02-19 LAB — HIV ANTIBODY (ROUTINE TESTING W REFLEX): HIV Screen 4th Generation wRfx: NONREACTIVE

## 2017-02-19 LAB — GLUCOSE, CAPILLARY: Glucose-Capillary: 94 mg/dL (ref 65–99)

## 2017-02-19 LAB — ECHOCARDIOGRAM COMPLETE
Height: 62.5 in
Weight: 2126.4 oz

## 2017-02-19 LAB — HEPARIN LEVEL (UNFRACTIONATED)
Heparin Unfractionated: 0.12 IU/mL — ABNORMAL LOW (ref 0.30–0.70)
Heparin Unfractionated: 0.17 IU/mL — ABNORMAL LOW (ref 0.30–0.70)

## 2017-02-19 MED ORDER — CARVEDILOL 3.125 MG PO TABS
3.1250 mg | ORAL_TABLET | Freq: Two times a day (BID) | ORAL | Status: DC
  Administered 2017-02-19 – 2017-02-20 (×2): 3.125 mg via ORAL
  Filled 2017-02-19 (×2): qty 1

## 2017-02-19 MED ORDER — DOCUSATE SODIUM 100 MG PO CAPS
100.0000 mg | ORAL_CAPSULE | Freq: Every day | ORAL | Status: DC | PRN
  Administered 2017-02-19: 100 mg via ORAL
  Filled 2017-02-19: qty 1

## 2017-02-19 NOTE — Progress Notes (Addendum)
Progress Note  Patient Name: [name] Date of Encounter: 02/19/2017  Primary Cardiologist: Tamala Julian  Subjective   Had a rough afternoon yesterday, but feeling much better this morning.   Inpatient Medications    Scheduled Meds: . acidophilus  1 capsule Oral Daily  . acyclovir  400 mg Oral BID  . aspirin EC  81 mg Oral Daily  . atorvastatin  80 mg Oral q1800  . carvedilol  3.125 mg Oral BID WC  . cholecalciferol  1,000 Units Oral Daily  . [START ON 02/22/2017] estradiol  0.025 mg Transdermal Weekly  . pantoprazole  40 mg Oral Q1200  . polyethylene glycol  17 g Oral Daily  . QUEtiapine  25 mg Oral QHS  . ranolazine  500 mg Oral BID  . sodium chloride flush  3 mL Intravenous Q12H  . sodium chloride flush  3 mL Intravenous Q12H   Continuous Infusions: . sodium chloride    . sodium chloride    . sodium chloride    . sodium chloride     PRN Meds: sodium chloride, sodium chloride, acetaminophen, ALPRAZolam, calcium carbonate, diazepam, dicyclomine, loperamide, morphine injection, nitroGLYCERIN, ondansetron (ZOFRAN) IV, oxyCODONE, sodium chloride flush, sodium chloride flush   Vital Signs    Vitals:   02/18/17 2002 02/19/17 0240 02/19/17 0534 02/19/17 0614  BP: 114/71 (!) 90/56 124/67   Pulse: 76 66  68  Resp: 17 13 18    Temp: 98.2 F (36.8 C)   98.2 F (36.8 C)  TempSrc: Oral   Oral  SpO2: 92% 100% 96%   Weight:    132 lb 14.4 oz (60.3 kg)  Height:        Intake/Output Summary (Last 24 hours) at 02/19/17 1113 Last data filed at 02/19/17 1000  Gross per 24 hour  Intake           990.07 ml  Output              650 ml  Net           340.07 ml   Filed Weights   02/17/17 2209 02/18/17 1249 02/19/17 0614  Weight: 130 lb (59 kg) 131 lb 1.6 oz (59.5 kg) 132 lb 14.4 oz (60.3 kg)    Telemetry    SR - Personally Reviewed  ECG    SR with TWI in lateral leads (no change) - Personally Reviewed  Physical Exam   General: Well developed, well nourished, female  appearing in no acute distress. Head: Normocephalic, atraumatic.  Neck: Supple without bruits, JVD. Lungs:  Resp regular and unlabored, CTA. Heart: RRR, S1, S2, no S3, S4, or murmur; no rub. Abdomen: Soft, non-tender, non-distended with normoactive bowel sounds. No hepatomegaly. No rebound/guarding. No obvious abdominal masses. Extremities: No clubbing, cyanosis, edema. Distal pedal pulses are 2+ bilaterally. R radial cath site stable.  Neuro: Alert and oriented X 3. Moves all extremities spontaneously. Psych: Normal affect.  Labs    Chemistry Recent Labs Lab 02/17/17 2244 02/18/17 0824  NA 140 138  K 3.4* 3.8  CL 109 105  CO2 25 24  GLUCOSE 98 110*  BUN 11 9  CREATININE 0.76 0.62  CALCIUM 8.5* 8.8*  PROT 6.1* 6.5  ALBUMIN 3.7 3.8  AST 25 35  ALT 25 26  ALKPHOS 77 78  BILITOT 0.3 0.5  GFRNONAA >60 >60  GFRAA >60 >60  ANIONGAP 6 9     Hematology Recent Labs Lab 02/17/17 2244 02/18/17 0824 02/19/17 0217  WBC 4.8 7.0  6.2  RBC 3.68* 3.93 3.60*  HGB 10.5* 11.0* 10.0*  HCT 31.9* 34.1* 31.2*  MCV 86.7 86.8 86.7  MCH 28.5 28.0 27.8  MCHC 32.9 32.3 32.1  RDW 13.9 14.1 14.1  PLT 208 232 212    Cardiac Enzymes Recent Labs Lab 02/18/17 0003 02/18/17 0824 02/18/17 1306 02/18/17 1843  TROPONINI 0.72* 1.64* 1.24* 0.81*    Recent Labs Lab 02/17/17 2308  TROPIPOC 0.17*     BNP Recent Labs Lab 02/18/17 0943  BNP 553.5*     DDimer No results for input(s): DDIMER in the last 168 hours.    Radiology    Dg Chest 2 View  Result Date: 02/17/2017 CLINICAL DATA:  Mid chest pain with elevated blood pressure tonight. EXAM: CHEST  2 VIEW COMPARISON:  08/05/2015 FINDINGS: Mild hyperinflation likely representing emphysema. Normal heart size and pulmonary vascularity. No focal airspace disease or consolidation in the lungs. No blunting of costophrenic angles. No pneumothorax. Mediastinal contours appear intact. Mild degenerative changes in the spine. IMPRESSION:  Emphysematous changes in the lungs. No evidence of active pulmonary disease. Electronically Signed   By: Lucienne Capers M.D.   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 EF 25-35% with mildly elevated LVEDP.  Severe diffuse calcification of the proximal to mid left anterior descending. 30-40% proximal LAD, 60-70% mid LAD, ostial 80-90% first diagonal, with mild luminal irregularities in the right coronary and circumflex coronary arteries.  RECOMMENDATIONS:   When compared to prior angiography, no significant change has occurred other than the presence of the wall motion abnormality identified above. Perhaps the first diagonal obstruction is slightly worse. Did not consider intervention on the diagonal as it does not explain the patient's wall motion abnormality.  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   Patient Profile     66 y.o. female with PMH of nonobstructive CAD, bladder cancer (s/p BCG therapy last yr), esophageal spasm, GERD, HTN who presented with chest pain to the ED. Underwent cardiac cath.   Assessment & Plan    1. Chest pain: trop peaked at 1.64. Underwent cardiac cath with Dr. Tamala Julian that showed no change from previous. Newly reduced EF on LV gram. Suspect stress induced cardiomyopathy as she has been under significant stress with her daughter recently.  -- stop IV nitro and heparin -- add low dose coreg today. Suspect she may not be able to tolerate ACEi/ARB with blood pressures.  -- echo read pending  2. 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 No signs of volume overload. Plan for GDT as tolerated by blood pressures.  3. HL: on high dose statin  4. HTN: recommendations as above  5. Hx of IBS/GERD: Lots of nausea yesterday, but now improved.  -- continue home  medications.   6. Hyperkalemia: Potassium replaced this am   Signed, Reino Bellis, NP  02/19/2017, 11:13 AM  Pager # [phone]   For questions or updates, please contact [place] Please consult [email or web address] for contact info under Cardiology/STEMI. Daytime calls, contact the Day Call APP (6a-8a) or assigned team (Teams A-D) provider (7:30a - 5p). All other daytime calls (7:30-5p), contact the Card Master @ [phone].   Nighttime calls, contact the assigned APP (5p-8p) or MD (6:30p-8p). Overnight calls (8p-6a), contact the on call Fellow @ [phone].  I have personally seen and examined this patient with Reino Bellis,  NP. I agree with the assessment and plan as outlined above. She was admitted with severe chest pain and elevated troponin. Cardiac cath with stable moderate CAD. No PCI was performed. LV systolic dysfunction in pattern c/w stress induced cardiomyopathy. Plan medical management. No chest pain this am. My exam shows a WDWN female in NAD. CV:RRR no murmur. Lungs: clear bilaterally. Ext: no edema.  Plan to start coreg today. D/C IV NTG and IV heparin.  Likely will not tolerate Ace-inh or ARB at this point due to soft BP Monitor x 24 more hours.  Likely d/c home tomorrow.   Lauree Chandler 02/19/2017 11:54 AM

## 2017-02-19 NOTE — Plan of Care (Signed)
Problem: Pain Managment: Goal: General experience of comfort will improve Outcome: Progressing Patient educated on pain scale, PRN pain medications and need to report pain before it becomes intolerable

## 2017-02-19 NOTE — Progress Notes (Signed)
ANTICOAGULATION CONSULT NOTE - Follow Up Consult  Pharmacy Consult for heparin Indication: CAD  Labs:  Recent Labs  02/17/17 2244  02/18/17 0824 02/18/17 1306 02/18/17 1843 02/19/17 0217  HGB 10.5*  --  11.0*  --   --  10.0*  HCT 31.9*  --  34.1*  --   --  31.2*  PLT 208  --  232  --   --  212  LABPROT  --   --  12.8  --   --   --   INR  --   --  0.97  --   --   --   HEPARINUNFRC  --   --  0.31  --   --  0.12*  CREATININE 0.76  --  0.62  --   --   --   TROPONINI  --   < > 1.64* 1.24* 0.81*  --   < > = values in this interval not displayed.   Assessment: 66yo female subtherapeutic on heparin after resume post-cath for medical management of CAD; was previously therapeutic at lower rate so may need more time to accumulate.  Goal of Therapy:  Heparin level 0.3-0.7 units/ml   Plan:  Will increase heparin gtt conservatively to 800 units/hr and check level in [place], PharmD, BCPS  02/19/2017,3:35 AM

## 2017-02-19 NOTE — Progress Notes (Signed)
  Echocardiogram 2D Echocardiogram has been performed.  [name] M 02/19/2017, 10:17 AM

## 2017-02-19 NOTE — Progress Notes (Signed)
CARDIAC REHAB PHASE I   PRE:  Rate/Rhythm: 86 SR    BP: sitting 103/55    SaO2:   MODE:  Ambulation: 550 ft   POST:  Rate/Rhythm: 91 SR    BP: sitting 103/62     SaO2:   Tolerated very well, no c/o. Sts she likes to challenge herself. Encouraged her to rest/recuperate until she sees cardiologist. Normally goes to gym and does weights and elliptical. Ed completed. Interested in [facility] and will send referral to [place], ACSM 02/19/2017 2:30 PM

## 2017-02-20 LAB — CBC
HCT: 31.2 % — ABNORMAL LOW (ref 36.0–46.0)
Hemoglobin: 9.8 g/dL — ABNORMAL LOW (ref 12.0–15.0)
MCH: 27.7 pg (ref 26.0–34.0)
MCHC: 31.4 g/dL (ref 30.0–36.0)
MCV: 88.1 fL (ref 78.0–100.0)
Platelets: 205 10*3/uL (ref 150–400)
RBC: 3.54 MIL/uL — ABNORMAL LOW (ref 3.87–5.11)
RDW: 14.2 % (ref 11.5–15.5)
WBC: 3.8 10*3/uL — ABNORMAL LOW (ref 4.0–10.5)

## 2017-02-20 LAB — GLUCOSE, CAPILLARY: Glucose-Capillary: 90 mg/dL (ref 65–99)

## 2017-02-20 MED ORDER — ATORVASTATIN CALCIUM 80 MG PO TABS: 80.0000 mg | ORAL_TABLET | Freq: Every day | ORAL | 5 refills | Status: DC

## 2017-02-20 MED ORDER — CARVEDILOL 3.125 MG PO TABS: 3.1250 mg | ORAL_TABLET | Freq: Two times a day (BID) | ORAL | 5 refills | Status: DC

## 2017-02-20 NOTE — Plan of Care (Signed)
Problem: Safety: Goal: Ability to remain free from injury will improve Outcome: Progressing Patient tolerates well ambulating to the restroom independently.

## 2017-02-20 NOTE — Discharge Summary (Signed)
Discharge Summary    Patient ID: [name],  MRN: [id number], DOB/AGE: 65-Feb-1952 65 y.o.  Admit date: 02/17/2017 Discharge date: 02/20/2017  Primary Care Provider: Leeroy Cha Primary Cardiologist: Dr. Daneen Schick  Discharge Diagnoses    Principal Problem:   NSTEMI (non-ST elevated myocardial infarction) [facility]) Active Problems:   Hyperlipidemia   Essential hypertension   Coronary artery disease involving native coronary artery of native heart with angina pectoris ([place])   GERD (gastroesophageal reflux disease)   Allergies Allergies  Allergen Reactions  . Clindamycin/Lincomycin Other (See Comments)    Pt not sure of reaction   . Doxycycline Nausea Only and Nausea And Vomiting  . Escitalopram Oxalate Nausea Only  . Hydrocodone Itching  . Hyoscyamine Itching  . Macrodantin Other (See Comments)    Flushed, hot  . Trazodone Nausea Only  . Trazodone And Nefazodone Nausea Only  . Trimethoprim Other (See Comments)    unknown   . Levsin [Hyoscyamine Sulfate] Hives, Swelling, Anxiety and Photosensitivity    Vision issues.  . Lincomycin Rash    Reaction unknown  . Nitrofurantoin Rash    Flushed, hot  . Nitrofurantoin Macrocrystal Anxiety    Flushing, hot face and ears    Diagnostic Studies/Procedures    LEFT HEART CATH AND CORONARY ANGIOGRAPHY  02/18/2017  Conclusion    Abnormal left ventricular wall motion suggesting Stress cardiomyopathy with atypical features (preserved apex function). EF 25-35% with mildly elevated LVEDP.  Severe diffuse calcification of the proximal to mid left anterior descending. 30-40% proximal LAD, 60-70% mid LAD, ostial 80-90% first diagonal, with mild luminal irregularities in the right coronary and circumflex coronary arteries.  RECOMMENDATIONS:   When compared to prior angiography, no significant change has occurred other than the presence of the wall motion abnormality identified above. Perhaps the first diagonal  obstruction is slightly worse. Did not consider intervention on the diagonal as it does not explain the patient's wall motion abnormality.  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  _____________  Echocardiogram 02/20/12 Study Conclusions - Left ventricle: The cavity size was normal. Systolic function was   mildly to moderately reduced. The estimated ejection fraction was   in the range of 40% to 45%. There is akinesis of the distal   anteroseptal, anterolateral, inferolateral, inferoseptal, and   apical myocardium with basal to mid sparing of contractile   performance. - Pulmonary arteries: Systolic pressure was mildly increased. PA   peak pressure: 36 mm Hg (S).  Impressions: - When compared to prior study, wall motion abnormality is new.    History of Present Illness     [name]  is a 66 y.o.white female with known stable angina and moderate midLAD CAD, bladder cancer (s/p BCG therapy last yr), esophageal spasm, GERD, HTN who presented with chest pain to the ED on 02/17/17. CP was sudden in onset, started around 8 PM after her work out. It was substernal in location, non radiating, got worse when she went upstairs and took shower, 10/10 in intensity. She called in and I recommended her to go to the ED via EMS. She said that she did not have any similar pain recently. Pain started at rest but got worse with exertion. Associated with nausea and palpitations but not associated with CHF symptoms, syncope, presyncope or diaphoresis. Improved with NTG but did not return to baseline. In ED, trop was 0.2. She was given aspirin and IV heparin along with NTG and morphine which helped the pain. Patient is  very active physically. Patient did not take NTG at home.   Of note, she did not have PCI in the past. She denies history of MI. She has a close family history of MI. She is followed by cardiologist Dr. Daneen Schick. She took 2 nitroglycerin at home with some relief of her pain.  She received a third dose of NTG by EMS and then some morphine which helped her pain tremendously. Her chest pain did return. She was previously diagnosed with bladder cancer and underwent BCG treatment previously. She denies fevers, coughing, shortness of breath, cough, hemoptysis, lightheadedness, dizziness, syncope, leg pain, leg swollen, recent long travel, history of DVT or PE.   Hospital Course     Consultants: None  Troponin peaked at 1.64 and trended down. The patient was taken to the cath lab on 02/18/2017 and found to have abnormal left ventricular wall motion suggesting stress cardiomyopathy with atypical features (preserved apex function). EF 25-35% with mildly elevated LVEDP.She also had severe diffuse calcification of the proximal to mid left anterior descending. 30-40% proximal LAD, 60-70% mid LAD, ostial 80-90% first diagonal, with mild luminal irregularities in the right coronary and circumflex coronary arteries. When compared to prior angiography, no significant change had occurred other than the presence of the wall motion abnormality. Perhaps the first diagonal obstruction is slightly worse. Did not consider intervention on the diagonal as it does not explain the patient's wall motion abnormality. Suspected to have stable CAD with new finding of stress induced cardiomyopathy. Medical management for presumed stress cardiomyopathy.  An echocardiogram showed EF 40-45% with akinesis of the distal anteroseptal, anterolateral, inferolateral, inferoseptal and apical myocardium with basal to mid sparing of contractile performance. She has been started on low dose beta blocker and tolerating coreg well. She does continue to have a strange feeling in her chest but not really pain. Her BP is low and she is not likely to tolerate an ACE-I or ARB.  Continue statin, aspirin. LDL on 02/18/17 was 73. Atorvastatin increased for further risk factor modification.  Right wrist cath site stable.   Patient  has been seen by Dr. Angelena Form today and deemed ready for discharge home. All follow up appointments have been scheduled. Discharge medications are listed below.  Will arrange for follow up in 1 week.  _____________  Discharge Vitals Blood pressure (!) 108/59, pulse 82, temperature 97.9 F (36.6 C), temperature source Oral, resp. rate 17, height 5' 2.5" (1.588 m), weight 132 lb 3.2 oz (60 kg), SpO2 100 %.  Filed Weights   02/18/17 1249 02/19/17 0614 02/20/17 0537  Weight: 131 lb 1.6 oz (59.5 kg) 132 lb 14.4 oz (60.3 kg) 132 lb 3.2 oz (60 kg)    Labs & Radiologic Studies    CBC  Recent Labs  02/17/17 2244  02/19/17 0217 02/20/17 0217  WBC 4.8  < > 6.2 3.8*  NEUTROABS 3.1  --   --   --   HGB 10.5*  < > 10.0* 9.8*  HCT 31.9*  < > 31.2* 31.2*  MCV 86.7  < > 86.7 88.1  PLT 208  < > 212 205  < > = values in this interval not displayed. Basic Metabolic Panel  Recent Labs  02/17/17 2244 02/18/17 0824  NA 140 138  K 3.4* 3.8  CL 109 105  CO2 25 24  GLUCOSE 98 110*  BUN 11 9  CREATININE 0.76 0.62  CALCIUM 8.5* 8.8*   Liver Function Tests  Recent Labs  02/17/17 2244 02/18/17  0824  AST 25 35  ALT 25 26  ALKPHOS 77 78  BILITOT 0.3 0.5  PROT 6.1* 6.5  ALBUMIN 3.7 3.8   No results for input(s): LIPASE, AMYLASE in the last 72 hours. Cardiac Enzymes  Recent Labs  02/18/17 0824 02/18/17 1306 02/18/17 1843  TROPONINI 1.64* 1.24* 0.81*   BNP Invalid input(s): POCBNP D-Dimer No results for input(s): DDIMER in the last 72 hours. Hemoglobin A1C  Recent Labs  02/18/17 0942  HGBA1C 5.2   Fasting Lipid Panel  Recent Labs  02/18/17 0824  CHOL 154  HDL 64  LDLCALC 73  TRIG 84  CHOLHDL 2.4   Thyroid Function Tests  Recent Labs  02/18/17 0941  TSH 2.526   _____________  Dg Chest 2 View  Result Date: 02/17/2017 CLINICAL DATA:  Mid chest pain with elevated blood pressure tonight. EXAM: CHEST  2 VIEW COMPARISON:  08/05/2015 FINDINGS: Mild  hyperinflation likely representing emphysema. Normal heart size and pulmonary vascularity. No focal airspace disease or consolidation in the lungs. No blunting of costophrenic angles. No pneumothorax. Mediastinal contours appear intact. Mild degenerative changes in the spine. IMPRESSION: Emphysematous changes in the lungs. No evidence of active pulmonary disease. Electronically Signed   By: Lucienne Capers M.D.   On: 02/17/2017 23:28   Disposition   Pt is being discharged home today in good condition.  Follow-up Plans & Appointments    Follow-up Information    [name], NP Follow up.   Specialties:  Cardiology, Radiology Why:  On Friday September 21st at 8:30 for cardiology hospital follow up.  Contact information: [address] [place] [address] [phone]          Discharge Instructions    Amb Referral to Cardiac Rehabilitation    Complete by:  As directed    Diagnosis:  NSTEMI Comment - [place]   Diet - low sodium heart healthy    Complete by:  As directed    Discharge instructions    Complete by:  As directed    PLEASE REMEMBER TO BRING ALL OF YOUR MEDICATIONS TO EACH OF YOUR FOLLOW-UP OFFICE VISITS.  PLEASE ATTEND ALL SCHEDULED FOLLOW-UP APPOINTMENTS.   Activity: Increase activity slowly as tolerated. You may shower, but no soaking baths (or swimming) for 1 week. No driving for 1 week. No lifting over 10 lbs for 2 weeks. No sexual activity for 2 weeks.   You May Return to Work: in 1 week (if applicable)  Wound Care: You may wash cath site gently with soap and water. Keep cath site clean and dry. If you notice pain, swelling, bleeding or pus at your cath site, please call [phone].   Increase activity slowly    Complete by:  As directed       Discharge Medications   Current Discharge Medication List    START taking these medications   Details  carvedilol (COREG) 3.125 MG tablet Take 1 tablet (3.125 mg total) by mouth 2 (two) times  daily with a meal. Qty: 30 tablet, Refills: 5      CONTINUE these medications which have CHANGED   Details  atorvastatin (LIPITOR) 80 MG tablet Take 1 tablet (80 mg total) by mouth daily at 6 PM. Qty: 30 tablet, Refills: 5      CONTINUE these medications which have NOT CHANGED   Details  acyclovir (ZOVIRAX) 400 MG tablet Take 400 mg by mouth 2 (two) times daily.    aspirin 81 MG tablet Take 81 mg  by mouth at bedtime.     Cholecalciferol (VITAMIN D) 1000 UNITS capsule Take 1,000 Units by mouth daily.    dicyclomine (BENTYL) 10 MG capsule Take 10 mg by mouth 3 (three) times daily as needed for spasms (IBS).    diltiazem 2 % GEL Apply 1 application topically as needed. For rectal spasm Qty: 30 g, Refills: 1    esomeprazole (NEXIUM) 40 MG packet Take 40 mg by mouth daily before breakfast. Qty: 30 each, Refills: 6    estradiol (CLIMARA - DOSED IN MG/24 HR) 0.025 mg/24hr patch Place 0.025 mg onto the skin once a week.    ibuprofen (ADVIL,MOTRIN) 200 MG tablet Take 600 mg by mouth every 6 (six) hours as needed for headache or moderate pain.    loperamide (IMODIUM A-D) 2 MG tablet Take 2 mg by mouth as needed for diarrhea or loose stools.    LORazepam (ATIVAN) 0.5 MG tablet take 1 tablet by mouth three times a day if needed Qty: 30 tablet, Refills: 0    nitroGLYCERIN (NITROSTAT) 0.4 MG SL tablet place 1 tablet under the tongue if needed every 5 minutes for chest pain for 3 doses IF NO RELIEF AFTER 3RD DOSE CALL PRESCRIBER OR 911. Qty: 75 tablet, Refills: 1    oxyCODONE (OXY IR/ROXICODONE) 5 MG immediate release tablet Take 5 mg by mouth as needed for moderate pain.     Probiotic Product (PROBIOTIC ACIDOPHILUS) CAPS Take 1 capsule by mouth daily.    QUEtiapine (SEROQUEL) 25 MG tablet Take 25 mg by mouth at bedtime. For sleep  (pt takes seroquel OR temazepam for sleep) Refills: 0    ranolazine (RANEXA) 500 MG 12 hr tablet Take 1 tablet (500 mg total) by mouth 2 (two) times  daily. Qty: 180 tablet, Refills: 3    temazepam (RESTORIL) 15 MG capsule Take 15 mg by mouth See admin instructions. Approximately twice weekly for sleep instead of seroquel      STOP taking these medications     calcium carbonate (TUMS - DOSED IN MG ELEMENTAL CALCIUM) 500 MG chewable tablet      diazepam (VALIUM) 2 MG tablet            Outstanding Labs/Studies   None  Duration of Discharge Encounter   Greater than 30 minutes including physician time.  Signed, Daune Perch NP 02/20/2017, 12:31 PM

## 2017-02-20 NOTE — Plan of Care (Signed)
Problem: Health Behavior/Discharge Planning: Goal: Ability to manage health-related needs will improve Outcome: Completed/Met Date Met: 02/20/17 Patient and husband given discharge instructions both verbally and in written format.

## 2017-02-20 NOTE — Progress Notes (Signed)
Progress Note  Patient Name: [name] Date of Encounter: 02/20/2017  Primary Cardiologist: Tamala Julian  Subjective   Pt is continuing to have mild central chest pressure with mild Shortness of breath, not related to activity, but possibly related to stress. She had mild discomfort while speaking with me about her daughter who has recently had illness and 7-8 surgeries this year. She had had a fight with her just before her chest pain began.   Inpatient Medications    Scheduled Meds: . acidophilus  1 capsule Oral Daily  . acyclovir  400 mg Oral BID  . aspirin EC  81 mg Oral Daily  . atorvastatin  80 mg Oral q1800  . carvedilol  3.125 mg Oral BID WC  . cholecalciferol  1,000 Units Oral Daily  . [START ON 02/22/2017] estradiol  0.025 mg Transdermal Weekly  . pantoprazole  40 mg Oral Q1200  . polyethylene glycol  17 g Oral Daily  . QUEtiapine  25 mg Oral QHS  . ranolazine  500 mg Oral BID  . sodium chloride flush  3 mL Intravenous Q12H  . sodium chloride flush  3 mL Intravenous Q12H   Continuous Infusions: . sodium chloride    . sodium chloride    . sodium chloride    . sodium chloride     PRN Meds: sodium chloride, sodium chloride, acetaminophen, ALPRAZolam, calcium carbonate, diazepam, dicyclomine, docusate sodium, loperamide, morphine injection, nitroGLYCERIN, ondansetron (ZOFRAN) IV, oxyCODONE, sodium chloride flush, sodium chloride flush   Vital Signs    Vitals:   02/19/17 1729 02/19/17 2047 02/20/17 0537 02/20/17 0908  BP: 102/65 104/60 94/65 (!) 108/59  Pulse: 85 82 65 82  Resp:  18 17   Temp:  98.5 F (36.9 C) 97.9 F (36.6 C)   TempSrc:  Oral Oral   SpO2:  100% 100%   Weight:   132 lb 3.2 oz (60 kg)   Height:        Intake/Output Summary (Last 24 hours) at 02/20/17 9509 Last data filed at 02/20/17 3267  Gross per 24 hour  Intake              502 ml  Output                0 ml  Net              502 ml   Filed Weights   02/18/17 1249 02/19/17  0614 02/20/17 0537  Weight: 131 lb 1.6 oz (59.5 kg) 132 lb 14.4 oz (60.3 kg) 132 lb 3.2 oz (60 kg)    Telemetry    SR in the 60's-80's - Personally Reviewed  ECG    No new tracings - Personally Reviewed  Physical Exam  Physical Exam  Constitutional: She is oriented to person, place, and time. She appears well-developed and well-nourished. No distress.  HENT:  Head: Normocephalic and atraumatic.  Neck: Normal range of motion. No JVD present.  Cardiovascular: Normal rate and regular rhythm.  Exam reveals no gallop and no friction rub.   No murmur heard. Pulmonary/Chest: Effort normal and breath sounds normal. No respiratory distress. She has no wheezes. She has no rales.  Abdominal: Soft. Bowel sounds are normal. There is no tenderness.  Musculoskeletal: Normal range of motion. She exhibits no edema or deformity.  Neurological: She is alert and oriented to person, place, and time.  Skin: Skin is warm and dry.  Psychiatric: She has a normal mood and affect. Her  behavior is normal. Thought content normal.  A little anxious about her current diagnosis.     Labs    Chemistry  Recent Labs Lab 02/17/17 2244 02/18/17 0824  NA 140 138  K 3.4* 3.8  CL 109 105  CO2 25 24  GLUCOSE 98 110*  BUN 11 9  CREATININE 0.76 0.62  CALCIUM 8.5* 8.8*  PROT 6.1* 6.5  ALBUMIN 3.7 3.8  AST 25 35  ALT 25 26  ALKPHOS 77 78  BILITOT 0.3 0.5  GFRNONAA >60 >60  GFRAA >60 >60  ANIONGAP 6 9     Hematology  Recent Labs Lab 02/18/17 0824 02/19/17 0217 02/20/17 0217  WBC 7.0 6.2 3.8*  RBC 3.93 3.60* 3.54*  HGB 11.0* 10.0* 9.8*  HCT 34.1* 31.2* 31.2*  MCV 86.8 86.7 88.1  MCH 28.0 27.8 27.7  MCHC 32.3 32.1 31.4  RDW 14.1 14.1 14.2  PLT 232 212 205    Cardiac Enzymes  Recent Labs Lab 02/18/17 0003 02/18/17 0824 02/18/17 1306 02/18/17 1843  TROPONINI 0.72* 1.64* 1.24* 0.81*     Recent Labs Lab 02/17/17 2308  TROPIPOC 0.17*     BNP  Recent Labs Lab 02/18/17 0943    BNP 553.5*     DDimer No results for input(s): DDIMER in the last 168 hours.    Radiology    No results found.  Cardiac Studies   Echocardiogram 02/19/17 Study Conclusions - Left ventricle: The cavity size was normal. Systolic function was   mildly to moderately reduced. The estimated ejection fraction was   in the range of 40% to 45%. There is akinesis of the distal   anteroseptal, anterolateral, inferolateral, inferoseptal, and   apical myocardium with basal to mid sparing of contractile   performance. - Pulmonary arteries: Systolic pressure was mildly increased. PA   peak pressure: 36 mm Hg (S).  Impressions: - When compared to prior study, wall motion abnormality is new.  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 EF 25-35% with mildly elevated LVEDP.  Severe diffuse calcification of the proximal to mid left anterior descending. 30-40% proximal LAD, 60-70% mid LAD, ostial 80-90% first diagonal, with mild luminal irregularities in the right coronary and circumflex coronary arteries.  RECOMMENDATIONS:  When compared to prior angiography, no significant change has occurred other than the presence of the wall motion abnormality identified above. Perhaps the first diagonal obstruction is slightly worse. Did not consider intervention on the diagonal as it does not explain the patient's wall motion abnormality.  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   Patient Profile     66 y.o. female with PMH of nonobstructive CAD, bladder cancer (s/p BCG therapy last yr), esophageal spasm, GERD, HTN who presented with chest pain to the ED. Underwent cardiac cath.   Assessment & Plan    1. Chest pain:  -trop peaked at 1.64, trending down. Underwent cardiac cath with Dr. Tamala Julian that showed no change from previous. No PCI preformed. Newly reduced EF on LV gram, 25-35%.  Suspect stress induced cardiomyopathy as she has been under significant stress with her daughter recently.  -Echo done yesterday showed EF 40% to 45% with akinesis of the distal anteroseptal, anterolateral, inferolateral, inferoseptal, and apical myocardium with basal to mid sparing of contractile performance. -Low dose coreg added. Blood pressures are soft but stable. Suspect she may not be able to tolerate ACEi/ARB with blood pressures.  -continues to have intermittent mild chest pressure  with very mild shortness of breath.   2. Stress induced cardiomyopathy:  -This appears to be a stress induced cardiomyopathy. No signs of volume overload. Plan for GDT as tolerated by blood pressures. -Pt with recent stress- her daughter has an illness and has required 7-8 surgeries this year. She had a fight with her daughter just before the chest pain started.  3. HL:  -on high intensity statin, Lipitor at 20 mg -LDL 73 on 02/18/17  4. HTN:  -Has not had hight BP in many years. Has actually been low in the last few weeks pre pt.  -recommendations as above  5. Hx of IBS/GERD:  -Lots of nausea on Tuesday, but now improved.  -continue home medications.   6. Hyperkalemia:  -resolved   Signed, Daune Perch, NP  02/20/2017, 9:22 AM  Pager # [phone]   I have personally seen and examined this patient with Daune Perch, NP I agree with the assessment and plan as outlined above. She is doing well this am. She had a weird feeling in her chest. Not really pain. She is tolerating Coreg. Suspected to have stable CAD with new finding of stress induced cardiomyopathy. Continue Coreg at low dose. Discharge home after lunch today. Follow up office one week.   Lauree Chandler 02/20/2017 11:06 AM

## 2017-02-23 ENCOUNTER — Encounter: Payer: Self-pay | Admitting: Physician Assistant

## 2017-02-23 ENCOUNTER — Encounter: Payer: Self-pay | Admitting: Cardiology

## 2017-02-23 ENCOUNTER — Telehealth: Payer: Self-pay | Admitting: Internal Medicine

## 2017-02-23 NOTE — Progress Notes (Signed)
Per fellow's request, I have sent a message to our [facility] office's scheduler requesting a follow-up appointment, and our office will call the patient with this information.  Harlowe Dowler PA-C

## 2017-02-23 NOTE — Telephone Encounter (Signed)
Patient was recently dx from the hospital for stress induced cardiomyopathy after having a LHC. She was placed on coreg (new medication for her) on d/c. She calls today stating that she thinks the coreg is causing her to have a funny sensation in her chest. She denies any pain/exertional pain, but can't quite describe the sensation. She claims that her vitals are stable and that she continue to the ranexa. I asked to hold a dose and see if this sensation improves. If it does, ensure that her HR's and BP are controlled HR's /w 60-70 and SBP of 100-110, without symptoms and that she would have to f/u with one of the cardiologists as an outpatient next week. She was agreeable to this plan and will need an appt for next week. She denies any significant pain, dizziness or syncopal symptoms.   She denies other new changes in medications.

## 2017-02-23 NOTE — Treatment Plan (Signed)
Patient's husband called back questioning that she had blockages and my advice earlier to go to ED.  Reconfirmed name address and DOB with him and again went over info and if having chest pain advised tern to ED.    Kerry Hough MD [facility] 7:08 PM

## 2017-02-24 ENCOUNTER — Encounter: Payer: Self-pay | Admitting: Cardiology

## 2017-02-24 ENCOUNTER — Ambulatory Visit (INDEPENDENT_AMBULATORY_CARE_PROVIDER_SITE_OTHER): Payer: Medicare Other | Admitting: Interventional Cardiology

## 2017-02-24 VITALS — BP 122/70 | HR 81 | Ht 62.5 in | Wt 129.4 lb

## 2017-02-24 DIAGNOSIS — R079 Chest pain, unspecified: Secondary | ICD-10-CM | POA: Diagnosis not present

## 2017-02-24 DIAGNOSIS — I6350 Cerebral infarction due to unspecified occlusion or stenosis of unspecified cerebral artery: Secondary | ICD-10-CM

## 2017-02-24 NOTE — Patient Instructions (Addendum)
Medication Instructions: Your physician has recommended you make the following change in your medication:  -1) STOP Carvedilol  Labwork: None Ordered  Procedures/Testing: Your physician has requested that you have an echocardiogram in 4 weeks before follow up appt. Echocardiography is a painless test that uses sound waves to create images of your heart. It provides your doctor with information about the size and shape of your heart and how well your heart's chambers and valves are working. This procedure takes approximately one hour. There are no restrictions for this procedure.  Follow-Up: Your physician recommends that you schedule a follow-up appointment in: 4-5 WEEKS with Cecilie Kicks, NP    If you need a refill on your cardiac medications before your next appointment, please call your pharmacy.

## 2017-02-24 NOTE — Progress Notes (Signed)
Cardiology Office Note   Date:  02/24/2017   ID:  [name], DOB 01/21/1951, MRN [id number]  PCP:  Leeroy Cha, MD  Cardiologist:  Dr. Tamala Julian    No chief complaint on file.     History of Present Illness: [name] is a 66 y.o. female who presents for post hospitalization for NSTEMI 02/18/17.  She has a hx of diffuse CAD, and Cerebral vascular disease. HLD, HTN, and anxiety disorder. Other hx of bladder cancer.   In May 2018 she was seen for tachycardia and hypotension at times and exertional SOB at times.    Pt underwent cardiac cath 02/18/17 and EF 25-35%,  Severe diffuse calcification of the proximal to mLAD 30-40% pLAD, 60-70% pLAD, sotial 80-90% first diag. With mild luminal irregularties in RCA and LCX.  No significant change in cath other than decrease of EF.  Medical management of stress cardiomyopathy.  Echo with same hospitaliztion with EF 40-45% with akinesis of distal anteroseptal, anterolateral, inferolateral, inferoseptal, and apical myocardium with basal to mid sparing of contractile performance.  Pulmonary arteries: Systolic pressure was mildly increased. PA   peak pressure: 36 mm Hg (S).  Over the weekend pt with chest sensation.  Appt was made for today.  While taking the coreg she developed a sensation in her chest no pain, then an awareness of heart beat a hard beating- she talked to fellow on call and he asked her to hold to coreg she did feel better the next day but then more significant pain and she took a NTG with relief.  She is taking her ranexa twice a day. When she called back about the more significant chest pain she talked to Dr. Wynonia Lawman and he instructed her to come to ER, which she did not feel she needed nor did she wish to go through this again.  He discussed her CAD with her and this made her more anxious -not realizing how much disease she had though it is somewhat better on most recent cath.  Pk troponin 1.64.         Past  Medical History:  Diagnosis Date  . Anxiety    Psychiatry Dr. Stan Head, concerns for memory loss, anxiety  . Bladder tumor   . Cancer ([place])    bladder CA  . Chronic chest pain   . Coronary atherosclerosis CARDIOLOGIST-  DR Daneen Schick   Cath 11/2006 demonstrating moderate obstructive coronary disease with 70-80% septal perforator #1, 50-70% first diagonal, 80% third diagonal, 50% mid and distal LAD & heavy calcification throughout all 3 coronaries. LVF normal.  . Esophageal motility disorder   . Essential hypertension, benign   . Fatty liver 04/28/12  . GERD (gastroesophageal reflux disease)   . H/O esophageal spasm   . Heart murmur   . History of esophageal spasm    takes ranexa  . History of TIA (transient ischemic attack)    2011--  no residuals  . Hyperlipidemia   . IBS (irritable bowel syndrome)    Diarrhea predominent, esophageal spasm severe, GERD - Dr. Elicia Lamp  . Interstitial cystitis    Dr. Gaynelle Arabian  . Memory loss    Psychiatry Dr. Stan Head, concerns for memory loss, anxiety  . PONV (postoperative nausea and vomiting)    AND URINARY RETENTION  . Sensation of pressure in bladder area   . TMJ (temporomandibular joint syndrome)   . Wears glasses     Past Surgical History:  Procedure Laterality Date  .  ABDOMINAL HYSTERECTOMY  AGE 10 -- 1989  . APPENDECTOMY  1971   AND OVARIAN CYSTECTOMY  . BENIGN EXCISIONAL BREAST BX  1996  . CARDIAC CATHETERIZATION  11-28-2006   DR Daneen Schick    moderate cad/  70-80% septal perforator #1, 50-70% first diagonal, 80% third diagonal, 50% mid and distal LAD & heavy calcification throughout all 3 coronaries. LVF normal.  . CARDIAC CATHETERIZATION  03-26-2012   DR Daneen Schick   patent CFX,  RCA,  & pLAD/  mLAD >50% to <70% a focal eccentric region that overlaps the third diagonal/  90% diagonal ostial and unchanged from prior study /   ef 65%  . CYSTO WITH HYDRODISTENSION N/A 01/14/2014   Procedure:  CYSTOSCOPY/HYDRODISTENSION/INSERTION OF MARCAINE AND PYRIDUIM INTO BLADDER/INJECTION OF MARCAINE AND KENALOG into sub trigone space ;  Surgeon: Ailene Rud, MD;  Location: [facility];  Service: Urology;  Laterality: N/A;  . CYSTO/  HYDRODISTENTION/  INSTILLATION THERAPY  03-12-2010  . ESOPHAGOGASTRODUODENOSCOPY  04/30/2012   Procedure: ESOPHAGOGASTRODUODENOSCOPY (EGD);  Surgeon: Lafayette Dragon, MD;  Location: [facility];  Service: Endoscopy;  Laterality: N/A;  . LEFT HEART CATH AND CORONARY ANGIOGRAPHY N/A 02/18/2017   Procedure: LEFT HEART CATH AND CORONARY ANGIOGRAPHY;  Surgeon: Belva Crome, MD;  Location: [facility];  Service: Cardiovascular;  Laterality: N/A;  . LUMBAR [place]  . NEGATIVE SLEEP STUDY  2010  . SHOULDER ARTHROSCOPY Left 12-23-2011  . TRANSTHORACIC ECHOCARDIOGRAM  04-21-2012   GRADE I DIASTOLIC DYSFUNCTION/  EF 60-65%/  MILD MR  &  TR  . TRANSURETHRAL RESECTION OF BLADDER TUMOR Right 01/03/2015   Procedure: TRANSURETHRAL RESECTION OF BLADDER TUMOR (TURBT);  Surgeon: Carolan Clines, MD;  Location[facility];  Service: Urology;  Laterality: Right;     Current Outpatient Prescriptions  Medication Sig Dispense Refill  . acyclovir (ZOVIRAX) 400 MG tablet Take 400 mg by mouth 2 (two) times daily.    Marland Kitchen aspirin 81 MG tablet Take 81 mg by mouth at bedtime.     Marland Kitchen atorvastatin (LIPITOR) 80 MG tablet Take 1 tablet (80 mg total) by mouth daily at 6 PM. 30 tablet 5  . carvedilol (COREG) 3.125 MG tablet Take 1 tablet (3.125 mg total) by mouth 2 (two) times daily with a meal. 30 tablet 5  . Cholecalciferol (VITAMIN D) 1000 UNITS capsule Take 1,000 Units by mouth daily.    Marland Kitchen dicyclomine (BENTYL) 10 MG capsule Take 10 mg by mouth 3 (three) times daily as needed for spasms (IBS).    Marland Kitchen diltiazem 2 % GEL Apply 1 application topically as needed. For rectal spasm 30 g 1  . esomeprazole (NEXIUM) 40 MG packet Take 40 mg by mouth  daily before breakfast. 30 each 6  . estradiol (CLIMARA - DOSED IN MG/24 HR) 0.025 mg/24hr patch Place 0.025 mg onto the skin once a week.    Marland Kitchen ibuprofen (ADVIL,MOTRIN) 200 MG tablet Take 600 mg by mouth every 6 (six) hours as needed for headache or moderate pain.    Marland Kitchen loperamide (IMODIUM A-D) 2 MG tablet Take 2 mg by mouth as needed for diarrhea or loose stools.    Marland Kitchen LORazepam (ATIVAN) 0.5 MG tablet take 1 tablet by mouth three times a day if needed (Patient taking differently: take 1 tablet by mouth three times a day if needed esophageal spasms) 30 tablet 0  . nitroGLYCERIN (NITROSTAT) 0.4 MG SL tablet place 1 tablet under the tongue if  needed every 5 minutes for chest pain for 3 doses IF NO RELIEF AFTER 3RD DOSE CALL PRESCRIBER OR 911. 75 tablet 1  . oxyCODONE (OXY IR/ROXICODONE) 5 MG immediate release tablet Take 5 mg by mouth as needed for moderate pain.     . Probiotic Product (PROBIOTIC ACIDOPHILUS) CAPS Take 1 capsule by mouth daily.    . QUEtiapine (SEROQUEL) 25 MG tablet Take 25 mg by mouth at bedtime. For sleep  (pt takes seroquel OR temazepam for sleep)  0  . ranolazine (RANEXA) 500 MG 12 hr tablet Take 1 tablet (500 mg total) by mouth 2 (two) times daily. 180 tablet 3  . temazepam (RESTORIL) 15 MG capsule Take 15 mg by mouth See admin instructions. Approximately twice weekly for sleep instead of seroquel     No current facility-administered medications for this visit.     Allergies:   Clindamycin/lincomycin; Doxycycline; Escitalopram oxalate; Hydrocodone; Hyoscyamine; Macrodantin; Trazodone; Trazodone and nefazodone; Trimethoprim; Levsin [hyoscyamine sulfate]; Lincomycin; Nitrofurantoin; and Nitrofurantoin macrocrystal    Social History:  The patient  reports that she quit smoking about 43 years ago. Her smoking use included Cigarettes. She quit after 10.00 years of use. She has never used smokeless tobacco. She reports that she drinks about 1.2 oz of alcohol per week . She reports  that she does not use drugs.   Family History:  The patient's family history includes Alzheimer's disease in her mother; CAD in her father and mother; Colon polyps in her mother; Dementia in her mother; Depression in her other; Diverticulosis in her father; Heart disease in her brother, father, and mother; Obesity in her other; Osteoarthritis in her other.    ROS:  General:no colds or fevers, no weight changes Skin:no rashes or ulcers HEENT:no blurred vision, no congestion CV:see HPI PUL:see HPI GI:no diarrhea constipation or melena, no indigestion GU:no hematuria, no dysuria MS:no joint pain, no claudication Neuro:no syncope, no lightheadedness Endo:no diabetes, no thyroid disease  Wt Readings from Last 3 Encounters:  02/20/17 132 lb 3.2 oz (60 kg)  01/23/17 129 lb 12.8 oz (58.9 kg)  11/22/16 130 lb 12.8 oz (59.3 kg)     PHYSICAL EXAM: VS:  There were no vitals taken for this visit. , BMI There is no height or weight on file to calculate BMI. General:Pleasant affect, NAD Skin:Warm and dry, brisk capillary refill HEENT:normocephalic, sclera clear, mucus membranes moist Neck:supple, no JVD, no bruits  Heart:S1S2 RRR without murmur, gallup, rub or click Lungs:clear without rales, rhonchi, or wheezes ZDG:UYQI, non tender, + BS, do not palpate liver spleen or masses Ext:no lower ext edema, 2+ pedal pulses, 2+ radial pulses Neuro:alert and oriented, MAE, follows commands, + facial symmetry    EKG:  EKG is ordered today. The ekg ordered today demonstrates SR with improved T waves in lateral leads.  improved   Recent Labs: 11/06/2016: Magnesium 2.1 02/18/2017: ALT 26; B Natriuretic Peptide 553.5; BUN 9; Creatinine, Ser 0.62; Potassium 3.8; Sodium 138; TSH 2.526 02/20/2017: Hemoglobin 9.8; Platelets 205    Lipid Panel    Component Value Date/Time   CHOL 154 02/18/2017 0824   TRIG 84 02/18/2017 0824   HDL 64 02/18/2017 0824   CHOLHDL 2.4 02/18/2017 0824   VLDL 17 02/18/2017  0824   LDLCALC 73 02/18/2017 0824       Other studies Reviewed: Additional studies/ records that were reviewed today include: . 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 EF 25-35% with mildly elevated LVEDP.  Severe diffuse calcification of the proximal to mid left anterior descending. 30-40% proximal LAD, 60-70% mid LAD, ostial 80-90% first diagonal, with mild luminal irregularities in the right coronary and circumflex coronary arteries.  RECOMMENDATIONS:   When compared to prior angiography, no significant change has occurred other than the presence of the wall motion abnormality identified above. Perhaps the first diagonal obstruction is slightly worse. Did not consider intervention on the diagonal as it does not explain the patient's wall motion abnormality.  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   Echo 02/19/17 Study Conclusions  - Left ventricle: The cavity size was normal. Systolic function was   mildly to moderately reduced. The estimated ejection fraction was   in the range of 40% to 45%. There is akinesis of the distal   anteroseptal, anterolateral, inferolateral, inferoseptal, and   apical myocardium with basal to mid sparing of contractile   performance. - Pulmonary arteries: Systolic pressure was mildly increased. PA   peak pressure: 36 mm Hg (S).  Impressions:  - When compared to prior study, wall motion abnormality is new.   ASSESSMENT AND PLAN:  1.  NSTEMI due to taksubo cardiomyopathy Dr. Tamala Julian saw and discussed. After speaking with the patient and feel much of her symptoms could still be related to stress cardiomyopathy syndrome with decreased systolic function and acute systolic heart failure. She has been far too active and I have recommended that she significantly  decrease physical activity until she improves.  2.  Chest pain from coreg, awareness of HR, EKG is improved plan to decrease activity for now.The patient is instructed not to resume carvedilol.  3.. HTN in hospital but usually very soft BP and at times to 80 systolic.  Stop coreg. Continue Ranexa BID.   4.  CAD stable to improved.  On lipitor will continue  5.  GERD per PCP  6.  Anxiety due to diagnosis use her PRN meds  She has ativan at home.    7.  HLD continue statin.   An hour of time was spent between Dr. Tamala Julian and myself.     Current medicines are reviewed with the patient today.  The patient Has no concerns regarding medicines.  The following changes have been made:  See above Labs/ tests ordered today include:see above  Disposition:   FU:  see above  Signed, Cecilie Kicks, NP  02/24/2017 1:24 PM    [facility] [address], [place], [place], [place] Phone: [phone]; Fax: [phone]  The patient has been seen in conjunction with Cecilie Kicks, NP-See. All aspects of care have been considered and discussed. The patient has been personally interviewed, examined, and all clinical data has been reviewed.   Various complaints including palpitations, sharp stabbing chest pain, and a sensation of tightness in the chest. Symptoms are vague. Some of the symptoms are similar to presentation. Dyspnea is a component.  Unable to tolerate carvedilol. This medication was discontinued over the weekend and will remain so.  Significant reduction in physical activity. I think she is still in the process of improving from acute systolic heart failure. Much of her chest tightness and dyspnea probably related to the documented low LVEF while hospitalized. Have encouraged her to remove external emotional stress sores.  Clinical follow-up in 3-4 weeks. At that time an  echo will be repeated to assess  recovery from stress cardiomyopathy.

## 2017-02-25 ENCOUNTER — Telehealth (HOSPITAL_COMMUNITY): Payer: Self-pay

## 2017-02-25 NOTE — Telephone Encounter (Signed)
Patient insurances are active and benefits verified.  Patient insurances are Medicare A/B and [place].   Medicare A/B - no co-payment, deductible $183.00/$183.00 has been met, 20% co-insurance, no out of pocket and no pre-authorization. Passport/reference [phone]. BCBS medicare supplement - no co-payment, no deductible, no out of pocket, no co-insurance and no pre-authorization. Passport/reference [phone].

## 2017-02-26 ENCOUNTER — Telehealth: Payer: Self-pay | Admitting: Interventional Cardiology

## 2017-02-26 DIAGNOSIS — G5601 Carpal tunnel syndrome, right upper limb: Secondary | ICD-10-CM | POA: Diagnosis not present

## 2017-02-26 DIAGNOSIS — M79641 Pain in right hand: Secondary | ICD-10-CM | POA: Diagnosis not present

## 2017-02-26 DIAGNOSIS — Z4789 Encounter for other orthopedic aftercare: Secondary | ICD-10-CM | POA: Diagnosis not present

## 2017-02-26 NOTE — Telephone Encounter (Signed)
Yes, for now. Anytime there is an acute coronary event, we optimize statin therapy for weeks to months and then decrease to a less intense dose.

## 2017-02-26 NOTE — Telephone Encounter (Signed)
Pt recently in hospital and Atorvastatin was changed to 80mg  QD.  Pt was previously on Atorvastatin 20mg  QD.  LDL in hospital was 73.  Per hospital note, Atorvastatin was increased for risk factor modification.  Pt questioning if statin was suppose to be increased this much?  Advised I would send message to Dr. Tamala Julian for review and advisement.

## 2017-02-26 NOTE — Telephone Encounter (Signed)
[name] is calling because her dosage of her Atorvastatin was increased to 80 mg and she was on 20 mg . Wants to know if that is right . Please call

## 2017-02-26 NOTE — Telephone Encounter (Signed)
Spoke with pt and made her aware of information provided by Dr. Tamala Julian.  Pt verbalized understanding and was appreciative for call.

## 2017-02-28 ENCOUNTER — Ambulatory Visit: Payer: Medicare Other | Admitting: Cardiology

## 2017-03-04 DIAGNOSIS — C678 Malignant neoplasm of overlapping sites of bladder: Secondary | ICD-10-CM | POA: Diagnosis not present

## 2017-03-05 ENCOUNTER — Telehealth: Payer: Self-pay | Admitting: Interventional Cardiology

## 2017-03-05 DIAGNOSIS — R002 Palpitations: Secondary | ICD-10-CM

## 2017-03-05 NOTE — Telephone Encounter (Signed)
Patient called and states that she had an episode at 1 AM where she felt her heart racing. She states that she got nervous and took her BP and it was 173/93 HR 112. She states that she took it again and it was 182/96 HR 100. Patient states that she took NTG x1 and after 15 minutes her BP and HR returned to normal. Patient states that she never had and chest pain or any other symptoms. She states that she only felt her heart racing and she was uncomfortable and nervous. Patient's BP this morning is 134/82 HR 67. Patient takes ranexa 500 mg BID. Patient takes ativan prn. Patient denies having symptoms at this time but wanted to let Dr. Tamala Julian know that she had another episode. Patient has been unable to tolerate carvedilol in the past. Patient did not want to wear a monitor in the past, but states that would be willing to at this time if Dr. Tamala Julian thinks she needs one. Made patient aware that the information would be forwarded to Dr. Tamala Julian for review and recommendation. ER precautions reviewed with the patient and she verbalized understanding and thanked me for the call.

## 2017-03-05 NOTE — Telephone Encounter (Signed)
Follow up    Patient calling back to follow up on previous call regarding wearing a monitor.

## 2017-03-05 NOTE — Telephone Encounter (Signed)
New message     Pt c/o BP issue: STAT if pt c/o blurred vision, one-sided weakness or slurred speech  1. What are your last 5 BP readings? 182/96 100 hr ,  173/93 112hr (last night) 134/82 67 hr (this morning)  2. Are you having any other symptoms (ex. Dizziness, headache, blurred vision, passed out)? No symptoms , having chest discomfort tightness and feeling of heart racing  3. What is your BP issue?  Patient wants you to know she is having another event of her heart racing and her bp being high

## 2017-03-05 NOTE — Telephone Encounter (Signed)
Called patient back. Informed her that her message was sent to Dr. Tamala Julian and we will give her a call with his advisement. Informed patient that if her symptoms come back and she feels like she did last time when she had her cath, for her to go to the ED. Patient verbalized understanding.

## 2017-03-06 NOTE — Telephone Encounter (Signed)
- 

## 2017-03-07 NOTE — Telephone Encounter (Signed)
New Message ° ° pt verbalized that she is returning call for rn °

## 2017-03-07 NOTE — Telephone Encounter (Signed)
Patient stated that her BP is good at 117/76, and that she feels that taking the amlodipine might be too much. Patient stated she is still feeling continuous slight pressure in her chest and out of breath without doing any activity. Patient is concerned that she had this episode out of no where. Patient stated at the office visit about a few months ago that Dr. Tamala Julian discussed a cardiac monitor. Patient wanted Dr. Thompson Caul advisement on getting a monitor and does she really need to take amlodipine. Will forward message to Dr. Tamala Julian.

## 2017-03-08 NOTE — Telephone Encounter (Signed)
Wear 30 day monitor and set OV thereafter.

## 2017-03-10 NOTE — Telephone Encounter (Signed)
New Message ° ° pt verbalized that she is returning call for rn °

## 2017-03-10 NOTE — Telephone Encounter (Signed)
Spoke with pt and made her aware of recommendations per Dr. Tamala Julian.  Pt wants to hold off on starting BP meds for now but will monitor and call if BP continues to jump up.  No BP issues since original call.  Advised pt I will place order and scheduling dept will call to schedule.  Pt verbalized understanding and was in agreement with this plan.  Will set pt up for OV once monitor is scheduled.

## 2017-03-10 NOTE — Telephone Encounter (Signed)
Left message to call back  

## 2017-03-17 ENCOUNTER — Telehealth: Payer: Self-pay | Admitting: Interventional Cardiology

## 2017-03-17 ENCOUNTER — Ambulatory Visit (INDEPENDENT_AMBULATORY_CARE_PROVIDER_SITE_OTHER): Payer: Medicare Other

## 2017-03-17 DIAGNOSIS — R002 Palpitations: Secondary | ICD-10-CM | POA: Diagnosis not present

## 2017-03-17 NOTE — Telephone Encounter (Signed)
Spoke with patient who states her husband called in her defense because she has not slept all weekend. Patient got on the phone and states she is really upset and "does not feel cared for." She states she was called last week and told she needed a monitor and after several days of waiting finally got a call that she could come in to get the monitor on 10/22. She states she feels she needs to get the monitor now. I assured her that I would help her and asked her to describe the symptoms to me. She states her heart is racing at rest; states it is more of a "sensation" not as much pressure and not fluttering. She states she is aware of it all of the time, especially at rest. She states HR 190's bpm and during these episodes her BP is also elevated. She states she took NTG which was the only thing that brought the HR and BP down. She denies caffeine consumption except for a small amount of decaf coffee in the mornings and states she is staying well hydrated. She states she eats regularly and continues to work on reducing sodium in her diet. She states she does not know how long to expect to feel like this and wonders how long it will be until her activity level returns to normal. She states her BP and HR baselines are 117/67 mmHg and 67 bpm. I advised her that our monitor schedule has been limited due to the number of monitors currently in use by patients and that I will need to check on first available appointment. I advised that I will route the message to Dr. Tamala Julian for advice also. She states her previous messages were not getting to him and I assured her that I will check with him to make certain he gets this message. She verbalized understanding and agreement and thanked me for my help.

## 2017-03-17 NOTE — Telephone Encounter (Signed)
New message       Pt had a heart attack approx 3 weeks ago.  Calling to get an update on plan of care

## 2017-03-17 NOTE — Telephone Encounter (Signed)
Clinical manager, Georgana Curio, RN, was able to get patient a monitor appointment today at 3:00. She called to notify the patient.

## 2017-03-19 NOTE — Telephone Encounter (Signed)
Start Carvedilol 3.125 mg po BID Agree with monitor ASAP

## 2017-03-20 MED ORDER — METOPROLOL SUCCINATE ER 25 MG PO TB24: 12.5000 mg | ORAL_TABLET | Freq: Every day | ORAL | 3 refills | Status: DC

## 2017-03-20 NOTE — Telephone Encounter (Signed)
Spoke with pt and went over Dr. Thompson Caul recommendations.  Pt states that she just stopped Carvedilol a few weeks ago because it was making her feel bad and palps worse.  This was stopped 02/24/17.  Pt states she walked for 40mins yesterday with no issues.  When she went to lay down last night she felt the fluttering again.  Advised I would send message to Dr. Tamala Julian for review and advisement.

## 2017-03-20 NOTE — Addendum Note (Signed)
Addended by: Loren Racer on: 03/20/2017 12:39 PM   Modules accepted: Orders

## 2017-03-20 NOTE — Telephone Encounter (Signed)
Spoke with pt and made her aware of Dr. Thompson Caul recommendations.  Pt verbalized understanding and was in agreement with this plan.  Prescription sent to preferred pharmacy.

## 2017-03-20 NOTE — Telephone Encounter (Signed)
Spoke with Dr. Tamala Julian and he said to have pt try Metoprolol Succ 12.5mg  QD.

## 2017-03-25 ENCOUNTER — Other Ambulatory Visit: Payer: Self-pay

## 2017-03-25 ENCOUNTER — Ambulatory Visit (HOSPITAL_COMMUNITY): Payer: Medicare Other | Attending: Cardiovascular Disease

## 2017-03-25 DIAGNOSIS — I2510 Atherosclerotic heart disease of native coronary artery without angina pectoris: Secondary | ICD-10-CM | POA: Insufficient documentation

## 2017-03-25 DIAGNOSIS — R011 Cardiac murmur, unspecified: Secondary | ICD-10-CM | POA: Insufficient documentation

## 2017-03-25 DIAGNOSIS — I272 Pulmonary hypertension, unspecified: Secondary | ICD-10-CM | POA: Diagnosis not present

## 2017-03-25 DIAGNOSIS — R079 Chest pain, unspecified: Secondary | ICD-10-CM | POA: Insufficient documentation

## 2017-03-25 DIAGNOSIS — I252 Old myocardial infarction: Secondary | ICD-10-CM | POA: Diagnosis not present

## 2017-03-25 DIAGNOSIS — Z8249 Family history of ischemic heart disease and other diseases of the circulatory system: Secondary | ICD-10-CM | POA: Diagnosis not present

## 2017-03-25 DIAGNOSIS — Z87891 Personal history of nicotine dependence: Secondary | ICD-10-CM | POA: Diagnosis not present

## 2017-03-25 DIAGNOSIS — Z8673 Personal history of transient ischemic attack (TIA), and cerebral infarction without residual deficits: Secondary | ICD-10-CM | POA: Insufficient documentation

## 2017-03-25 DIAGNOSIS — Z8551 Personal history of malignant neoplasm of bladder: Secondary | ICD-10-CM | POA: Insufficient documentation

## 2017-03-25 DIAGNOSIS — I10 Essential (primary) hypertension: Secondary | ICD-10-CM | POA: Diagnosis not present

## 2017-03-25 DIAGNOSIS — E785 Hyperlipidemia, unspecified: Secondary | ICD-10-CM | POA: Insufficient documentation

## 2017-03-27 DIAGNOSIS — F5101 Primary insomnia: Secondary | ICD-10-CM | POA: Diagnosis not present

## 2017-04-01 DIAGNOSIS — F5101 Primary insomnia: Secondary | ICD-10-CM | POA: Diagnosis not present

## 2017-04-07 ENCOUNTER — Ambulatory Visit (INDEPENDENT_AMBULATORY_CARE_PROVIDER_SITE_OTHER): Payer: Medicare Other | Admitting: Cardiology

## 2017-04-07 ENCOUNTER — Encounter: Payer: Self-pay | Admitting: Cardiology

## 2017-04-07 VITALS — BP 128/70 | HR 69 | Ht 62.5 in | Wt 131.8 lb

## 2017-04-07 DIAGNOSIS — I2510 Atherosclerotic heart disease of native coronary artery without angina pectoris: Secondary | ICD-10-CM | POA: Diagnosis not present

## 2017-04-07 DIAGNOSIS — R079 Chest pain, unspecified: Secondary | ICD-10-CM | POA: Diagnosis not present

## 2017-04-07 DIAGNOSIS — I428 Other cardiomyopathies: Secondary | ICD-10-CM

## 2017-04-07 DIAGNOSIS — I10 Essential (primary) hypertension: Secondary | ICD-10-CM | POA: Diagnosis not present

## 2017-04-07 DIAGNOSIS — R002 Palpitations: Secondary | ICD-10-CM

## 2017-04-07 DIAGNOSIS — I6350 Cerebral infarction due to unspecified occlusion or stenosis of unspecified cerebral artery: Secondary | ICD-10-CM

## 2017-04-07 MED ORDER — NEBIVOLOL HCL 2.5 MG PO TABS: 2.5000 mg | ORAL_TABLET | Freq: Every day | ORAL | 5 refills | Status: DC

## 2017-04-07 NOTE — Patient Instructions (Signed)
Medication Instructions:  1) STOP Metoprolol 2) START Bystolic 2.5mg  once daiyl  Labwork: None  Testing/Procedures: None  Follow-Up: Your physician recommends that you schedule a follow-up appointment in: late November-early December with Dr. Tamala Julian or Sandria Senter on a day what Dr. Tamala Julian is in the office. (Needs to be after Thanksgiving)   Any Other Special Instructions Will Be Listed Below (If Applicable).     If you need a refill on your cardiac medications before your next appointment, please call your pharmacy.

## 2017-04-07 NOTE — Progress Notes (Signed)
Cardiology Office Note   Date:  04/07/2017   ID:  MOZETTA MURFIN, DOB 09/17/1950, MRN [id number]  PCP:  Leeroy Cha, MD  Cardiologist:  Dr Tamala Julian    Chief Complaint  Patient presents with  . Hypertension      History of Present Illness: [name] is a 66 y.o. female who presents for medication intolerance with BB and palpations."flutters",    She hasa hx of diffuse CAD, and Cerebral vascular disease. HLD, HTN, and anxiety disorder. Other hx of bladder cancer.   In May 2018 she was seen for tachycardia and hypotension at times and exertional SOB at times.    Pt underwent cardiac cath 02/18/17 and EF 25-35%,  Severe diffuse calcification of the proximal to mLAD 30-40% pLAD, 60-70% pLAD, sotial 80-90% first diag. With mild luminal irregularties in RCA and LCX.  No significant change in cath other than decrease of EF.  Medical management of stress cardiomyopathy.  Echo with same hospitaliztion with EF 40-45% with akinesis of distal anteroseptal, anterolateral, inferolateral, inferoseptal, and apical myocardium with basal to mid sparing of contractile performance.  Pulmonary arteries: Systolic pressure was mildly increased. PA peak pressure: 36 mm Hg (S).  On previous visit pt with chest sensation. While taking the coreg she developed a sensation in her chest no pain, then an awareness of heart beat a hard beating- she talked to fellow on call and he asked her to hold to coreg she did feel better the next day but then more significant pain and she took a NTG with relief.  She is taking her ranexa twice a day. When she called back about the more significant chest pain she talked to Dr. Wynonia Lawman and he instructed her to come to ER, which she did not feel she needed nor did she wish to go through this again.  He discussed her CAD with her and this made her more anxious -not realizing how much disease she had though it is somewhat better on most recent cath.  Pk troponin  1.64.        EF 60-65% now improved.    She has been intolerant to BB, she is on Ranexa.  Last night she had funny feeling in her chest and "flutters" for about an hour.  BP elevated to 154/77.  With walking las week she had chest discomfort and took NTG with relief she has slowed her activity just when she thought she was better.  She is wearing a monitor now  And was wearing last night.  She believes she pressed button.  She did not tolerate toprol low dose  She did well planning her 40th wedding anniversary.  It went well and she had a great times.     Past Medical History:  Diagnosis Date  . Anxiety    Psychiatry Dr. Stan Head, concerns for memory loss, anxiety  . Bladder tumor   . Cancer ([place])    bladder CA  . Chronic chest pain   . Coronary atherosclerosis CARDIOLOGIST-  DR Daneen Schick   Cath 11/2006 demonstrating moderate obstructive coronary disease with 70-80% septal perforator #1, 50-70% first diagonal, 80% third diagonal, 50% mid and distal LAD & heavy calcification throughout all 3 coronaries. LVF normal.  . Esophageal motility disorder   . Essential hypertension, benign   . Fatty liver 04/28/12  . GERD (gastroesophageal reflux disease)   . H/O esophageal spasm   . Heart murmur   . History of esophageal spasm  takes ranexa  . History of TIA (transient ischemic attack)    2011--  no residuals  . Hyperlipidemia   . IBS (irritable bowel syndrome)    Diarrhea predominent, esophageal spasm severe, GERD - Dr. Elicia Lamp  . Interstitial cystitis    Dr. Gaynelle Arabian  . Memory loss    Psychiatry Dr. Stan Head, concerns for memory loss, anxiety  . PONV (postoperative nausea and vomiting)    AND URINARY RETENTION  . Sensation of pressure in bladder area   . TMJ (temporomandibular joint syndrome)   . Wears glasses     Past Surgical History:  Procedure Laterality Date  . ABDOMINAL HYSTERECTOMY  AGE 51 -- 1989  . APPENDECTOMY  1971   AND OVARIAN CYSTECTOMY  . BENIGN  EXCISIONAL BREAST BX  1996  . CARDIAC CATHETERIZATION  11-28-2006   DR Daneen Schick    moderate cad/  70-80% septal perforator #1, 50-70% first diagonal, 80% third diagonal, 50% mid and distal LAD & heavy calcification throughout all 3 coronaries. LVF normal.  . CARDIAC CATHETERIZATION  03-26-2012   DR Daneen Schick   patent CFX,  RCA,  & pLAD/  mLAD >50% to <70% a focal eccentric region that overlaps the third diagonal/  90% diagonal ostial and unchanged from prior study /   ef 65%  . CYSTO WITH HYDRODISTENSION N/A 01/14/2014   Procedure: CYSTOSCOPY/HYDRODISTENSION/INSERTION OF MARCAINE AND PYRIDUIM INTO BLADDER/INJECTION OF MARCAINE AND KENALOG into sub trigone space ;  Surgeon: Ailene Rud, MD;  Location: [facility];  Service: Urology;  Laterality: N/A;  . CYSTO/  HYDRODISTENTION/  INSTILLATION THERAPY  03-12-2010  . ESOPHAGOGASTRODUODENOSCOPY  04/30/2012   Procedure: ESOPHAGOGASTRODUODENOSCOPY (EGD);  Surgeon: Lafayette Dragon, MD;  Location: [facility];  Service: Endoscopy;  Laterality: N/A;  . LEFT HEART CATH AND CORONARY ANGIOGRAPHY N/A 02/18/2017   Procedure: LEFT HEART CATH AND CORONARY ANGIOGRAPHY;  Surgeon: Belva Crome, MD;  Location: [facility];  Service: Cardiovascular;  Laterality: N/A;  . LUMBAR [place]  . NEGATIVE SLEEP STUDY  2010  . SHOULDER ARTHROSCOPY Left 12-23-2011  . TRANSTHORACIC ECHOCARDIOGRAM  04-21-2012   GRADE I DIASTOLIC DYSFUNCTION/  EF 60-65%/  MILD MR  &  TR  . TRANSURETHRAL RESECTION OF BLADDER TUMOR Right 01/03/2015   Procedure: TRANSURETHRAL RESECTION OF BLADDER TUMOR (TURBT);  Surgeon: Carolan Clines, MD;  Location: [facility];  Service: Urology;  Laterality: Right;     Current Outpatient Prescriptions  Medication Sig Dispense Refill  . acyclovir (ZOVIRAX) 400 MG tablet Take 400 mg by mouth 2 (two) times daily.    Marland Kitchen aspirin 81 MG tablet Take 81 mg by mouth at bedtime.     Marland Kitchen atorvastatin (LIPITOR)  80 MG tablet Take 40 mg by mouth daily.  0  . Cholecalciferol (VITAMIN D) 1000 UNITS capsule Take 1,000 Units by mouth daily.    Marland Kitchen dicyclomine (BENTYL) 10 MG capsule Take 10 mg by mouth 3 (three) times daily as needed for spasms (IBS).    Marland Kitchen diltiazem 2 % GEL Apply 1 application topically as needed. For rectal spasm 30 g 1  . esomeprazole (NEXIUM) 40 MG packet Take 40 mg by mouth daily before breakfast. 30 each 6  . estradiol (CLIMARA - DOSED IN MG/24 HR) 0.025 mg/24hr patch Place 0.025 mg onto the skin once a week.    Marland Kitchen ibuprofen (ADVIL,MOTRIN) 200 MG tablet Take 600 mg by mouth every 6 (six) hours as needed for headache  or moderate pain.    Marland Kitchen loperamide (IMODIUM A-D) 2 MG tablet Take 2 mg by mouth as needed for diarrhea or loose stools.    Marland Kitchen LORazepam (ATIVAN) 0.5 MG tablet Take 0.5 mg by mouth 3 (three) times daily as needed (Espohegeal spasms).    . nitroGLYCERIN (NITROSTAT) 0.4 MG SL tablet place 1 tablet under the tongue if needed every 5 minutes for chest pain for 3 doses IF NO RELIEF AFTER 3RD DOSE CALL PRESCRIBER OR 911. 75 tablet 1  . oxyCODONE (OXY IR/ROXICODONE) 5 MG immediate release tablet Take 5 mg by mouth as needed for moderate pain.     . Probiotic Product (PROBIOTIC ACIDOPHILUS) CAPS Take 1 capsule by mouth daily.    . QUEtiapine (SEROQUEL) 25 MG tablet Take 25 mg by mouth at bedtime. For sleep  (pt takes seroquel OR temazepam for sleep)  0  . ranolazine (RANEXA) 500 MG 12 hr tablet Take 1 tablet (500 mg total) by mouth 2 (two) times daily. 180 tablet 3  . temazepam (RESTORIL) 15 MG capsule Take 15 mg by mouth See admin instructions. Approximately twice weekly for sleep instead of seroquel    . nebivolol (BYSTOLIC) 2.5 MG tablet Take 1 tablet (2.5 mg total) by mouth daily. 30 tablet 5   No current facility-administered medications for this visit.     Allergies:   Clindamycin/lincomycin; Doxycycline; Escitalopram oxalate; Hyoscyamine; Macrodantin; Trazodone; Trazodone and  nefazodone; Trimethoprim; Levsin [hyoscyamine sulfate]; Lincomycin; Nitrofurantoin; and Nitrofurantoin macrocrystal    Social History:  The patient  reports that she quit smoking about 43 years ago. Her smoking use included Cigarettes. She quit after 10.00 years of use. She has never used smokeless tobacco. She reports that she drinks about 1.2 oz of alcohol per week . She reports that she does not use drugs.   Family History:  The patient's family history includes Alzheimer's disease in her mother; CAD in her father and mother; Colon polyps in her mother; Dementia in her mother; Depression in her other; Diverticulosis in her father; Heart disease in her brother, father, and mother; Obesity in her other; Osteoarthritis in her other.    ROS:  General:no colds or fevers, no weight changes Skin:no rashes or ulcers HEENT:no blurred vision, no congestion CV:see HPI PUL:see HPI GI:no diarrhea constipation or melena, no indigestion GU:no hematuria, no dysuria MS:no joint pain, no claudication Neuro:no syncope, no lightheadedness Endo:no diabetes, no thyroid disease  Wt Readings from Last 3 Encounters:  04/07/17 131 lb 12.8 oz (59.8 kg)  02/24/17 129 lb 6.4 oz (58.7 kg)  02/20/17 132 lb 3.2 oz (60 kg)     PHYSICAL EXAM: VS:  BP 128/70 (BP Location: Right Arm)   Pulse 69   Ht 5' 2.5" (1.588 m)   Wt 131 lb 12.8 oz (59.8 kg)   BMI 23.72 kg/m  , BMI Body mass index is 23.72 kg/m. General:Pleasant affect, NAD Skin:Warm and dry, brisk capillary refill HEENT:normocephalic, sclera clear, mucus membranes moist Neck:supple, no JVD, no bruits  Heart:S1S2 RRR without murmur, gallup, rub or click, I did hear 2 premature beats Lungs:clear without rales, rhonchi, or wheezes BOF:BPZW, non tender, + BS, do not palpate liver spleen or masses Ext:no lower ext edema, 2+ pedal pulses, 2+ radial pulses Neuro:alert and oriented X 3, MAE, follows commands, + facial symmetry    EKG:  EKG is NOT ordered  today.    Recent Labs: 11/06/2016: Magnesium 2.1 02/18/2017: ALT 26; B Natriuretic Peptide 553.5; BUN 9; Creatinine, Ser 0.62;  Potassium 3.8; Sodium 138; TSH 2.526 02/20/2017: Hemoglobin 9.8; Platelets 205    Lipid Panel    Component Value Date/Time   CHOL 154 02/18/2017 0824   TRIG 84 02/18/2017 0824   HDL 64 02/18/2017 0824   CHOLHDL 2.4 02/18/2017 0824   VLDL 17 02/18/2017 0824   LDLCALC 73 02/18/2017 0824       Other studies Reviewed: Additional studies/ records that were reviewed today include: . Cath 02/18/17 Procedures   LEFT HEART CATH AND CORONARY ANGIOGRAPHY  Conclusion    Abnormal left ventricular wall motion suggesting Stress cardiomyopathy with atypical features (preserved apex function). EF 25-35% with mildly elevated LVEDP.  Severe diffuse calcification of the proximal to mid left anterior descending. 30-40% proximal LAD, 60-70% mid LAD, ostial 80-90% first diagonal, with mild luminal irregularities in the right coronary and circumflex coronary arteries.  RECOMMENDATIONS:   When compared to prior angiography, no significant change has occurred other than the presence of the wall motion abnormality identified above. Perhaps the first diagonal obstruction is slightly worse. Did not consider intervention on the diagonal as it does not explain the patient's wall motion abnormality.  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   Echo 02/19/17 Study Conclusions  - Left ventricle: The cavity size was normal. Systolic function was   mildly to moderately reduced. The estimated ejection fraction was   in the range of 40% to 45%. There is akinesis of the distal   anteroseptal, anterolateral, inferolateral, inferoseptal, and   apical myocardium with basal to mid sparing of contractile   performance. - Pulmonary arteries: Systolic pressure was mildly increased. PA   peak pressure: 36 mm Hg (S).  Impressions:  - When compared to prior study, wall  motion abnormality is new.  Echo 03/25/17 Study Conclusions  - Left ventricle: The cavity size was normal. Wall thickness was   normal. Systolic function was normal. The estimated ejection   fraction was in the range of 60% to 65%. Wall motion was normal;   there were no regional wall motion abnormalities. Features are   consistent with a pseudonormal left ventricular filling pattern,   with concomitant abnormal relaxation and increased filling   pressure (grade 2 diastolic dysfunction).  ASSESSMENT AND PLAN:  1.  NSTEMI with taksubo cardiomyopathy now resolved.  She still has her chest pain that she has had in the past and is on ranexa for.  She also has been intolerant of Imdur and toprol and coreg.  With returning chest pain will try bystolic at 2.5 mg to see if this will help prevent pain and prevent flutters.  She will follow up once event monitor complete.  I did ask her not to bike in the country this weekend- no cell service.  2..Marland KitchenCAD see above  3.. Flutters, wearing event monitor  4.  HTN with spikes in BP up to 154/77 then back to her usual of 433/29 will try bystolic.  4.  GERD per PCP     Current medicines are reviewed with the patient today.  The patient Has no concerns regarding medicines.  The following changes have been made:  See above Labs/ tests ordered today include:see above  Disposition:   FU:  see above  Signed, Cecilie Kicks, NP  04/07/2017 5:40 PM    [facility] [address], [place], [place], [place] Phone: [phone]; Fax: [phone]

## 2017-04-08 ENCOUNTER — Telehealth: Payer: Self-pay

## 2017-04-08 ENCOUNTER — Telehealth (HOSPITAL_COMMUNITY): Payer: Self-pay

## 2017-04-08 ENCOUNTER — Telehealth (HOSPITAL_COMMUNITY): Payer: Self-pay | Admitting: *Deleted

## 2017-04-08 NOTE — Telephone Encounter (Signed)
Attempted to call patient in regards to Cardiac Rehab - Lm on VM

## 2017-04-08 NOTE — Telephone Encounter (Signed)
**Note De-Identified  Obfuscation** I have done a Bystolic PA through covermymeds.

## 2017-04-08 NOTE — Telephone Encounter (Signed)
Message copied from in basket message sent on referral.  Requesting advisement to proceed with scheduling for Cardiac Rehab. See below:  ---- Message ----- From: [name], NP Sent: 04/08/2017   9:57 AM To: Rowe Pavy, RN Subject: RE: Ready for Cardiac rehab?                   Her EF is back to normal.  Her flutters and chest pain have occurred for some time prior to NSTEMI she has been walking at home and had been planning for 20 mile bike ride which I asked her not to do.  But yes she is ok to to begin, her symptoms are not frequent and it would be helpful to her and to [place] to see how she does with observation. Thanks.  Mickel Baas    ----- Message ----- From: Rowe Pavy, RN Sent: 04/08/2017   8:44 AM To: [name], NP Subject: Ready for Cardiac rehab?                       Mickel Baas,  The above patient referred by phase I inpatient staff to cardiac rehab s/p Takotsubo stress cardiomyopathy.  I reviewed your follow up office visit from yesterday.  I am not certain pt is ready for group exercise due to continued complaint of chest pain (unsure if exertional or at rest) and complaints of heart flutter.  Pt is wearing an event monitor and I do not know how much longer.  The next follow up appt is not until 12/3.  Based upon your assessment, do you feel pt is appropriate/ready to proceed with cardiac rehab or wait until the heart monitor can be read?  Thanks for your advisement! Cherre Huger, BSN Cardiac and Training and development officer

## 2017-04-09 NOTE — Telephone Encounter (Signed)
**Note De-Identified  Obfuscation** Received a denial on Bystolic PA due to DX of CP and flutters being off label uses. I have appealed that decision using DX of HTN. Dr Tamala Julian has signed the appeal and I have faxed back to [place].

## 2017-04-10 DIAGNOSIS — F5101 Primary insomnia: Secondary | ICD-10-CM | POA: Diagnosis not present

## 2017-04-10 DIAGNOSIS — Z23 Encounter for immunization: Secondary | ICD-10-CM | POA: Diagnosis not present

## 2017-04-15 NOTE — Telephone Encounter (Signed)
Called to speak with patient in regards to Cardiac Rehab - Daughter answered. Stated they were driving in a bad reception area and to call back at 4:00pm. Will call patient then.

## 2017-04-16 ENCOUNTER — Telehealth: Payer: Self-pay | Admitting: Interventional Cardiology

## 2017-04-16 NOTE — Telephone Encounter (Signed)
We received another appeal form from [employer] and I have completed form and  faxed it back to [employer].

## 2017-04-16 NOTE — Telephone Encounter (Signed)
Spoke with patient in regards to Cardiac Rehab - Patient is interested in program. Scheduled orientation on 05/06/2017 at 8:45am. Patient will attend the 1:15pm exc class.

## 2017-04-16 NOTE — Telephone Encounter (Signed)
[name] is wanting to know will it be wise to continue w/ the monitor , because she is having a lot of spikes with her bp? Having Angina with activities , not Sure if she should be doing activities . Also have a question about the Cardiac Rehab . Please call

## 2017-04-16 NOTE — Telephone Encounter (Addendum)
Pt seen by Cecilie Kicks, NP on 10/29 and was prescribed Bystolic 2.5mg  QD.  Pt has not started this as it required a prior auth from our office.  Currently in the process of getting this approved.  Pt states yesterday she felt some angina and pressure in chest while out walking.  Took Nitro with little relief.  Resolved once she got home.  States HR was in the 150's at one point yesterday but didn't last long.  Pt was just sitting and felt heart racing which prompted her to check HR.  Pt currently wearing monitor and hit the button several times yesterday.  Looked up strips and pt pressed button twice yesterday and was in NSR both times.  No calls from monitor company and no strips sent to office.  Pt states pressure and angina are not terrible but she does take a Nitro when it occurs.  States sx comes and go and she can be just sitting or it can occur with exertion.  Notices it frequently when she is walking really fast.  Last week pt went to gym and rode the bike with no resistance for 30 mins and was on the treadmill with no incline or increased speed for 15 mins.  Had no issues at all when at the gym.  Vitals this AM were 125/70, HR 67 and 116/60's, HR 70.  Pt mails monitor back in today.  Advised I will send message to Dr. Tamala Julian for review and advisement.  Pt not currently having issues. Advised when appropriate to go to ER.  Pt verbalized understanding and was appreciative for call.

## 2017-04-17 NOTE — Telephone Encounter (Signed)
I will have to review all data.Out of town 'til Monday.

## 2017-04-18 ENCOUNTER — Telehealth: Payer: Self-pay | Admitting: *Deleted

## 2017-04-18 MED ORDER — NITROGLYCERIN 0.4 MG SL SUBL: SUBLINGUAL_TABLET | SUBLINGUAL | 1 refills | Status: DC

## 2017-04-18 NOTE — Telephone Encounter (Signed)
Refill

## 2017-04-28 ENCOUNTER — Other Ambulatory Visit: Payer: Self-pay | Admitting: Internal Medicine

## 2017-04-28 DIAGNOSIS — E785 Hyperlipidemia, unspecified: Secondary | ICD-10-CM | POA: Diagnosis not present

## 2017-04-28 DIAGNOSIS — Z0000 Encounter for general adult medical examination without abnormal findings: Secondary | ICD-10-CM | POA: Diagnosis not present

## 2017-04-28 DIAGNOSIS — K219 Gastro-esophageal reflux disease without esophagitis: Secondary | ICD-10-CM | POA: Diagnosis not present

## 2017-04-28 DIAGNOSIS — I6350 Cerebral infarction due to unspecified occlusion or stenosis of unspecified cerebral artery: Secondary | ICD-10-CM | POA: Diagnosis not present

## 2017-04-28 DIAGNOSIS — F411 Generalized anxiety disorder: Secondary | ICD-10-CM | POA: Diagnosis not present

## 2017-04-28 DIAGNOSIS — C689 Malignant neoplasm of urinary organ, unspecified: Secondary | ICD-10-CM | POA: Diagnosis not present

## 2017-04-28 DIAGNOSIS — N951 Menopausal and female climacteric states: Secondary | ICD-10-CM | POA: Diagnosis not present

## 2017-04-28 DIAGNOSIS — I2510 Atherosclerotic heart disease of native coronary artery without angina pectoris: Secondary | ICD-10-CM | POA: Diagnosis not present

## 2017-04-28 DIAGNOSIS — I5181 Takotsubo syndrome: Secondary | ICD-10-CM | POA: Diagnosis not present

## 2017-04-28 DIAGNOSIS — Z139 Encounter for screening, unspecified: Secondary | ICD-10-CM

## 2017-04-28 DIAGNOSIS — Z1389 Encounter for screening for other disorder: Secondary | ICD-10-CM | POA: Diagnosis not present

## 2017-04-28 NOTE — Telephone Encounter (Signed)
The monitor did not identify any significant rhythm disturbance.  I recommend continue efforts to gradually build exercise tolerance.  If chest discomfort occurs with activity, stop and rest.  Do not exercise while having discomfort.  Hopefully the Bystolic has been started.

## 2017-04-28 NOTE — Telephone Encounter (Signed)
Left message to call back  

## 2017-04-28 NOTE — Telephone Encounter (Signed)
Spoke with pt and went over monitor results and information provided by Dr. Tamala Julian.  Pt states she was able to start Bystolic about a week ago.  Has had improvement in sx.  States no feelings of heart racing since she started.  Had an episode of lightheadedness the other day.  Did not check vitals at that time.  Went walking today to see if she could tolerate the exercise and had no issues.  Pt went to another doctor today and BP was 108/60's, HR 70's.  Advised pt to let us know if lightheadedness continues or if other sx return.  Pt verbalized understanding and was in agreement with this plan.

## 2017-04-30 ENCOUNTER — Telehealth (HOSPITAL_COMMUNITY): Payer: Self-pay

## 2017-05-06 ENCOUNTER — Encounter (HOSPITAL_COMMUNITY): Payer: Self-pay

## 2017-05-06 ENCOUNTER — Encounter (HOSPITAL_COMMUNITY)
Admission: RE | Admit: 2017-05-06 | Discharge: 2017-05-06 | Disposition: A | Discharge status: Home / Self Care | Payer: Medicare Other | Source: Ambulatory Visit | Attending: Interventional Cardiology | Admitting: Interventional Cardiology

## 2017-05-06 VITALS — Ht 63.25 in | Wt 132.7 lb

## 2017-05-06 DIAGNOSIS — I214 Non-ST elevation (NSTEMI) myocardial infarction: Secondary | ICD-10-CM

## 2017-05-06 NOTE — Progress Notes (Signed)
OUTPATIENT CARDIAC REHAB  PMH:  02/18/2017 NSTEMI, [place]  Primary Cardiologist:  Dr. Tamala Julian  Pt arrived at cardiac rehab orientation c/o mild chest discomfort while walking into hospital.  Rates 1/10.  Describes as "awareness of sensation".  Upon more evaluation, pt reports these symptoms are chronic for her, however disturbing for her in that they are persistent, daily; worse at night and in the morning.  Since last office visit with Cecilie Kicks, NP,  Pt reports she has decreased NTG SL use (average 1 per week).  Pt also notes since her hospitalization she has more "awareness of her heart" and more symptoms with NTG SL use ie headache and chest fullness.  Pt also reports episodes of bounding heartbeat , however denies tachy palpitations.    Pt tolerated 6 minute walk test without increase in chest sensations. Pt did c/o mild lightheadedness post walk test.   Orthostat VS obtained:   BP:  123/53     HR:    59     Lying  BP:  124/64  HR:   58     Sitting  BP:  114/56   HR:   57     Standing   No change in symptoms with position change.  Pt given water with relief of symptoms.     Pt does admit to significant situational stress and difficulty coping.  Pt offered emotional support and reassurance.  Pt offered counseling appt with Jeanella Craze.  Pt accepting of this offer.  Message left with Mikki Santee to schedule.    Pt has previously scheduled office f/u 05/12/2017.  Pt reports relief of symptoms prior to leaving department.  Dr. Tamala Julian made aware.   Proper NTG use and when to call 911 reviewed.  Pt verbalized understanding.  Andi Hence, RN, BSN   Cardiac Pulmonary Rehab

## 2017-05-06 NOTE — Progress Notes (Signed)
Cardiac Individual Treatment Plan  Patient Details  Name: [name] MRN: [id number] Date of Birth: May 20, 1951 Referring Provider:     [facility] from 05/06/2017 in [place]  Referring Provider  Daneen Schick MD      Initial Encounter Date:    CARDIAC REHAB PHASE II ORIENTATION from 05/06/2017 in [place]  Date  05/06/17  Referring Provider  Daneen Schick MD      Visit Diagnosis: NSTEMI (non-ST elevated myocardial infarction) [facility])  Patient's Home Medications on Admission:  Current Outpatient Medications:  .  acyclovir (ZOVIRAX) 400 MG tablet, Take 400 mg by mouth 2 (two) times daily., Disp: , Rfl:  .  aspirin 81 MG tablet, Take 81 mg by mouth at bedtime. , Disp: , Rfl:  .  atorvastatin (LIPITOR) 80 MG tablet, Take 40 mg by mouth daily., Disp: , Rfl: 0 .  Cholecalciferol (VITAMIN D) 1000 UNITS capsule, Take 1,000 Units by mouth daily., Disp: , Rfl:  .  dicyclomine (BENTYL) 10 MG capsule, Take 10 mg by mouth 3 (three) times daily as needed for spasms (IBS)., Disp: , Rfl:  .  diltiazem 2 % GEL, Apply 1 application topically as needed. For rectal spasm, Disp: 30 g, Rfl: 1 .  esomeprazole (NEXIUM) 40 MG packet, Take 40 mg by mouth daily before breakfast., Disp: 30 each, Rfl: 6 .  estradiol (CLIMARA - DOSED IN MG/24 HR) 0.025 mg/24hr patch, Place 0.025 mg onto the skin once a week., Disp: , Rfl:  .  ibuprofen (ADVIL,MOTRIN) 200 MG tablet, Take 600 mg by mouth every 6 (six) hours as needed for headache or moderate pain., Disp: , Rfl:  .  loperamide (IMODIUM A-D) 2 MG tablet, Take 2 mg by mouth as needed for diarrhea or loose stools., Disp: , Rfl:  .  LORazepam (ATIVAN) 0.5 MG tablet, Take 0.5 mg by mouth 3 (three) times daily as needed (Espohegeal spasms)., Disp: , Rfl:  .  nebivolol (BYSTOLIC) 2.5 MG tablet, Take 1 tablet (2.5 mg total) by mouth daily., Disp: 30 tablet, Rfl: 5 .  nitroGLYCERIN  (NITROSTAT) 0.4 MG SL tablet, place 1 tablet under the tongue if needed every 5 minutes for chest pain for 3 doses IF NO RELIEF AFTER 3RD DOSE CALL PRESCRIBER OR 911., Disp: 75 tablet, Rfl: 1 .  oxyCODONE (OXY IR/ROXICODONE) 5 MG immediate release tablet, Take 5 mg by mouth as needed for moderate pain. , Disp: , Rfl:  .  Probiotic Product (PROBIOTIC ACIDOPHILUS) CAPS, Take 1 capsule by mouth daily., Disp: , Rfl:  .  QUEtiapine (SEROQUEL) 25 MG tablet, Take 25 mg by mouth at bedtime. For sleep  (pt takes seroquel OR temazepam for sleep), Disp: , Rfl: 0 .  ranolazine (RANEXA) 500 MG 12 hr tablet, Take 1 tablet (500 mg total) by mouth 2 (two) times daily., Disp: 180 tablet, Rfl: 3 .  temazepam (RESTORIL) 15 MG capsule, Take 15 mg by mouth See admin instructions. Approximately twice weekly for sleep instead of seroquel, Disp: , Rfl:   Past Medical History: Past Medical History:  Diagnosis Date  . Anxiety    Psychiatry Dr. Stan Head, concerns for memory loss, anxiety  . Bladder tumor   . 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 Chronic chest pain   . Coronary atherosclerosis CARDIOLOGIST-  DR Daneen Schick   Cath 11/2006 demonstrating moderate obstructive coronary disease with 70-80% septal perforator #1, 50-70% first diagonal, 80%  third diagonal, 50% mid and distal LAD & heavy calcification throughout all 3 coronaries. LVF normal.  . Esophageal motility disorder   . Essential hypertension, benign   . Fatty liver 04/28/12  . GERD (gastroesophageal reflux disease)   . H/O esophageal spasm   . Heart murmur   . History of esophageal spasm    takes ranexa  . History of TIA (transient ischemic attack)    2011--  no residuals  . Hyperlipidemia   . IBS (irritable bowel syndrome)    Diarrhea predominent, esophageal spasm severe, GERD - Dr. Elicia Lamp  . Interstitial cystitis    Dr. Gaynelle Arabian  . Memory loss    Psychiatry Dr. Stan Head, concerns for memory loss, anxiety  . PONV (postoperative nausea and  vomiting)    AND URINARY RETENTION  . Sensation of pressure in bladder area   . TMJ (temporomandibular joint syndrome)   . Wears glasses     Tobacco Use: Social History   Tobacco Use  Smoking Status Former Smoker  . Years: 10.00  . Types: Cigarettes  . Last attempt to quit: 06/10/1973  . Years since quitting: 43.9  Smokeless Tobacco Never Used    Labs: Recent Chemical engineer    Labs for ITP Cardiac and Pulmonary Rehab Latest Ref Rng & Units 09/29/2009 09/30/2009 01/14/2014 05/28/2016 02/18/2017   Cholestrol 0 - 200 mg/dL - 128 ATP III CLASSIFICATION: <200     mg/dL   Desirable 200-239  mg/dL   Borderline High >=240    mg/dL   High - 195 154   LDLCALC 0 - 99 mg/dL - 60 Total Cholesterol/HDL:CHD Risk Coronary Heart Disease Risk Table Men   Women 1/2 Average Risk   3.4   3.3 Average Risk       5.0   4.4 2 X Average Risk   9.6   7.1 3 X Average Risk  23.4   11.0 Use the calculated Patient Ratio above and the CHD Risk Table to determine the patient's CHD Risk. ATP III CLASSIFICATION (LDL): <100     mg/dL   Optimal 100-129  mg/dL   Near or Above Optimal 130-159  mg/dL   Borderline 160-189  mg/dL   High >190     mg/dL   Very High - 112(H) 73   HDL >40 mg/dL - 43 - 67 64   Trlycerides <150 mg/dL - 126 - 80 84   Hemoglobin A1c 4.8 - 5.6 % - 5.4 (NOTE)                                                                       According to the ADA Clinical Practice Recommendations for 2011, when HbA1c is used as a screening test:   >=6.5%   Diagnostic of Diabetes Mellitus           (if abnormal result is confirmed)  5.7-6.4%   Increased risk of developing Diabetes Mellitus  References:Diagnosis and Classification of Diabetes Mellitus,Diabetes IWLN,9892,11(HERDE 1):S62-S69 and Standards of Medical Care in         Diabetes - 2011,Diabetes Care,2011,34 (Suppl 1):S11-S61. - - 5.2   TCO2 0 - 100 mmol/L 25 - 25 - -      Capillary Blood Glucose: Lab Results  Component Value Date    GLUCAP 90 02/20/2017   GLUCAP 94 02/19/2017   GLUCAP 98 09/30/2009     Exercise Target Goals: Date: 05/06/17  Exercise Program Goal: Individual exercise prescription set with THRR, safety & activity barriers. Participant demonstrates ability to understand and report RPE using BORG scale, to self-measure pulse accurately, and to acknowledge the importance of the exercise prescription.  Exercise Prescription Goal: Starting with aerobic activity 30 plus minutes a day, 3 days per week for initial exercise prescription. Provide home exercise prescription and guidelines that participant acknowledges understanding prior to discharge.  Activity Barriers & Risk Stratification: Activity Barriers & Cardiac Risk Stratification - 05/06/17 1229      Activity Barriers & Cardiac Risk Stratification   Cardiac Risk Stratification  High       6 Minute Walk: 6 Minute Walk    Row Name 05/06/17 1226         6 Minute Walk   Phase  Initial     Distance  1664 feet     Walk Time  6 minutes     # of Rest Breaks  0     MPH  3.15     METS  3.72     RPE  12     VO2 Peak  13.01     Symptoms  Yes (comment)     Comments  anginal discomfort 1-2 out of 10. Pt baseline is 1-2 out of 10 at sitting and walking on a flat surface     Resting HR  56 bpm     Resting BP  112/60     Resting Oxygen Saturation   99 %     Exercise Oxygen Saturation  during 6 min walk  98 %     Max Ex. HR  83 bpm     Max Ex. BP  120/70     2 Minute Post BP  114/76        Oxygen Initial Assessment:   Oxygen Re-Evaluation:   Oxygen Discharge (Final Oxygen Re-Evaluation):   Initial Exercise Prescription: Initial Exercise Prescription - 05/06/17 1200      Date of Initial Exercise RX and Referring Provider   Date  05/06/17    Referring Provider  Daneen Schick MD      Treadmill   MPH  2.8    Grade  0    Minutes  15    METs  3.14      Bike   Minutes  15      NuStep   Level  3    SPM  80    Minutes  15    METs   2.5      Prescription Details   Frequency (times per week)  3    Duration  Progress to 30 minutes of continuous aerobic without signs/symptoms of physical distress      Intensity   THRR 40-80% of Max Heartrate  62-124    Ratings of Perceived Exertion  11-13    Perceived Dyspnea  0-4      Progression   Progression  Continue to progress workloads to maintain intensity without signs/symptoms of physical distress.      Resistance Training   Training Prescription  Yes    Weight  3lbs    Reps  10-15       Perform Capillary Blood Glucose checks as needed.  Exercise Prescription Changes: Exercise Prescription Changes    Row Name 05/06/17 1200  Bike   Level  2.5 upright scifit       METs  2.5          Exercise Comments:   Exercise Goals and Review: Exercise Goals    Row Name 05/06/17 1231             Exercise Goals   Increase Physical Activity  Yes       Intervention  Provide advice, education, support and counseling about physical activity/exercise needs.;Develop an individualized exercise prescription for aerobic and resistive training based on initial evaluation findings, risk stratification, comorbidities and participant's personal goals.       Expected Outcomes  Achievement of increased cardiorespiratory fitness and enhanced flexibility, muscular endurance and strength shown through measurements of functional capacity and personal statement of participant.       Increase Strength and Stamina  Yes return to gym-like routine       Intervention  Provide advice, education, support and counseling about physical activity/exercise needs.;Develop an individualized exercise prescription for aerobic and resistive training based on initial evaluation findings, risk stratification, comorbidities and participant's personal goals.       Expected Outcomes  Achievement of increased cardiorespiratory fitness and enhanced flexibility, muscular endurance and strength shown  through measurements of functional capacity and personal statement of participant.       Able to understand and use rate of perceived exertion (RPE) scale  Yes       Intervention  Provide education and explanation on how to use RPE scale       Expected Outcomes  Short Term: Able to use RPE daily in rehab to express subjective intensity level;Long Term:  Able to use RPE to guide intensity level when exercising independently       Knowledge and understanding of Target Heart Rate Range (THRR)  Yes       Intervention  Provide education and explanation of THRR including how the numbers were predicted and where they are located for reference       Expected Outcomes  Short Term: Able to state/look up THRR;Long Term: Able to use THRR to govern intensity when exercising independently;Short Term: Able to use daily as guideline for intensity in rehab       Able to check pulse independently  Yes       Intervention  Provide education and demonstration on how to check pulse in carotid and radial arteries.;Review the importance of being able to check your own pulse for safety during independent exercise       Expected Outcomes  Short Term: Able to explain why pulse checking is important during independent exercise;Long Term: Able to check pulse independently and accurately       Understanding of Exercise Prescription  Yes       Intervention  Provide education, explanation, and written materials on patient's individual exercise prescription       Expected Outcomes  Short Term: Able to explain program exercise prescription;Long Term: Able to explain home exercise prescription to exercise independently          Exercise Goals Re-Evaluation :    Discharge Exercise Prescription (Final Exercise Prescription Changes): Exercise Prescription Changes - 05/06/17 1200      Bike   Level  2.5 upright scifit    METs  2.5       Nutrition:  Target Goals: Understanding of nutrition guidelines, daily intake of sodium  1500mg , cholesterol 200mg , calories 30% from fat and 7% or less from saturated fats, daily to have  5 or more servings of fruits and vegetables.  Biometrics: Pre Biometrics - 05/06/17 1231      Pre Biometrics   Height  5' 3.25" (1.607 m)    Weight  132 lb 11.5 oz (60.2 kg)    Waist Circumference  30.5 inches    Hip Circumference  38 inches    Waist to Hip Ratio  0.8 %    BMI (Calculated)  23.31    Triceps Skinfold  25 mm    % Body Fat  34.4 %    Grip Strength  22 kg    Flexibility  13 in    Single Leg Stand  10 seconds        Nutrition Therapy Plan and Nutrition Goals:   Nutrition Discharge: Nutrition Scores:   Nutrition Goals Re-Evaluation:   Nutrition Goals Re-Evaluation:   Nutrition Goals Discharge (Final Nutrition Goals Re-Evaluation):   Psychosocial: Target Goals: Acknowledge presence or absence of significant depression and/or stress, maximize coping skills, provide positive support system. Participant is able to verbalize types and ability to use techniques and skills needed for reducing stress and depression.  Initial Review & Psychosocial Screening: Initial Psych Review & Screening - 05/06/17 0956      Initial Review   Current issues with  Current Anxiety/Panic health related anxiety       Family Dynamics   Good Support System?  Yes spouse,family. friends     Comments  pt with health related anxiety and situational stress.  pt tearful and verbalizes feeling overwhelmed.        Barriers   Psychosocial barriers to participate in program  There are no identifiable barriers or psychosocial needs.      Screening Interventions   Interventions  Encouraged to exercise;Provide feedback about the scores to participant;Program counselor consult Jeanella Craze appt 05/16/2017 @ 2:30       Quality of Life Scores: Quality of Life - 05/06/17 1000      Quality of Life Scores   Health/Function Pre  17.47 %    Socioeconomic Pre  26.5 %    Psych/Spiritual Pre  22.43  %    Family Pre  15.6 %    GLOBAL Pre  20.26 %       PHQ-9: Recent Review Flowsheet Data    There is no flowsheet data to display.     Interpretation of Total Score  Total Score Depression Severity:  1-4 = Minimal depression, 5-9 = Mild depression, 10-14 = Moderate depression, 15-19 = Moderately severe depression, 20-27 = Severe depression   Psychosocial Evaluation and Intervention:   Psychosocial Re-Evaluation:   Psychosocial Discharge (Final Psychosocial Re-Evaluation):   Vocational Rehabilitation: Provide vocational rehab assistance to qualifying candidates.   Vocational Rehab Evaluation & Intervention: Vocational Rehab - 05/06/17 0954      Initial Vocational Rehab Evaluation & Intervention   Assessment shows need for Vocational Rehabilitation  No N/A       Education: Education Goals: Education classes will be provided on a weekly basis, covering required topics. Participant will state understanding/return demonstration of topics presented.  Learning Barriers/Preferences: Learning Barriers/Preferences - 05/06/17 0931      Learning Barriers/Preferences   Learning Barriers  Sight    Learning Preferences  Video;Pictoral;Computer/Internet       Education Topics: Count Your Pulse:  -Group instruction provided by verbal instruction, demonstration, patient participation and written materials to support subject.  Instructors address importance of being able to find your pulse and how to count your pulse  when at home without a heart monitor.  Patients get hands on experience counting their pulse with staff help and individually.   Heart Attack, Angina, and Risk Factor Modification:  -Group instruction provided by verbal instruction, video, and written materials to support subject.  Instructors address signs and symptoms of angina and heart attacks.    Also discuss risk factors for heart disease and how to make changes to improve heart health risk  factors.   Functional Fitness:  -Group instruction provided by verbal instruction, demonstration, patient participation, and written materials to support subject.  Instructors address safety measures for doing things around the house.  Discuss how to get up and down off the floor, how to pick things up properly, how to safely get out of a chair without assistance, and balance training.   Meditation and Mindfulness:  -Group instruction provided by verbal instruction, patient participation, and written materials to support subject.  Instructor addresses importance of mindfulness and meditation practice to help reduce stress and improve awareness.  Instructor also leads participants through a meditation exercise.    Stretching for Flexibility and Mobility:  -Group instruction provided by verbal instruction, patient participation, and written materials to support subject.  Instructors lead participants through series of stretches that are designed to increase flexibility thus improving mobility.  These stretches are additional exercise for major muscle groups that are typically performed during regular warm up and cool down.   Hands Only CPR:  -Group verbal, video, and participation provides a basic overview of AHA guidelines for community CPR. Role-play of emergencies allow participants the opportunity to practice calling for help and chest compression technique with discussion of AED use.   Hypertension: -Group verbal and written instruction that provides a basic overview of hypertension including the most recent diagnostic guidelines, risk factor reduction with self-care instructions and medication management.    Nutrition I class: Heart Healthy Eating:  -Group instruction provided by PowerPoint slides, verbal discussion, and written materials to support subject matter. The instructor gives an explanation and review of the Therapeutic Lifestyle Changes diet recommendations, which includes a  discussion on lipid goals, dietary fat, sodium, fiber, plant stanol/sterol esters, sugar, and the components of a well-balanced, healthy diet.   Nutrition II class: Lifestyle Skills:  -Group instruction provided by PowerPoint slides, verbal discussion, and written materials to support subject matter. The instructor gives an explanation and review of label reading, grocery shopping for heart health, heart healthy recipe modifications, and ways to make healthier choices when eating out.   Diabetes Question & Answer:  -Group instruction provided by PowerPoint slides, verbal discussion, and written materials to support subject matter. The instructor gives an explanation and review of diabetes co-morbidities, pre- and post-prandial blood glucose goals, pre-exercise blood glucose goals, signs, symptoms, and treatment of hypoglycemia and hyperglycemia, and foot care basics.   Diabetes Blitz:  -Group instruction provided by PowerPoint slides, verbal discussion, and written materials to support subject matter. The instructor gives an explanation and review of the physiology behind type 1 and type 2 diabetes, diabetes medications and rational behind using different medications, pre- and post-prandial blood glucose recommendations and Hemoglobin A1c goals, diabetes diet, and exercise including blood glucose guidelines for exercising safely.    Portion Distortion:  -Group instruction provided by PowerPoint slides, verbal discussion, written materials, and food models to support subject matter. The instructor gives an explanation of serving size versus portion size, changes in portions sizes over the last 20 years, and what consists of a serving from  each food group.   Stress Management:  -Group instruction provided by verbal instruction, video, and written materials to support subject matter.  Instructors review role of stress in heart disease and how to cope with stress positively.     Exercising on Your  Own:  -Group instruction provided by verbal instruction, power point, and written materials to support subject.  Instructors discuss benefits of exercise, components of exercise, frequency and intensity of exercise, and end points for exercise.  Also discuss use of nitroglycerin and activating EMS.  Review options of places to exercise outside of rehab.  Review guidelines for sex with heart disease.   Cardiac Drugs I:  -Group instruction provided by verbal instruction and written materials to support subject.  Instructor reviews cardiac drug classes: antiplatelets, anticoagulants, beta blockers, and statins.  Instructor discusses reasons, side effects, and lifestyle considerations for each drug class.   Cardiac Drugs II:  -Group instruction provided by verbal instruction and written materials to support subject.  Instructor reviews cardiac drug classes: angiotensin converting enzyme inhibitors (ACE-I), angiotensin II receptor blockers (ARBs), nitrates, and calcium channel blockers.  Instructor discusses reasons, side effects, and lifestyle considerations for each drug class.   Anatomy and Physiology of the Circulatory System:  Group verbal and written instruction and models provide basic cardiac anatomy and physiology, with the coronary electrical and arterial systems. Review of: AMI, Angina, Valve disease, Heart Failure, Peripheral Artery Disease, Cardiac Arrhythmia, Pacemakers, and the ICD.   Other Education:  -Group or individual verbal, written, or video instructions that support the educational goals of the cardiac rehab program.   Knowledge Questionnaire Score: Knowledge Questionnaire Score - 05/06/17 0955      Knowledge Questionnaire Score   Pre Score  23/24       Core Components/Risk Factors/Patient Goals at Admission: Personal Goals and Risk Factors at Admission - 05/06/17 1232      Core Components/Risk Factors/Patient Goals on Admission   Hypertension  Yes    Intervention   Provide education on lifestyle modifcations including regular physical activity/exercise, weight management, moderate sodium restriction and increased consumption of fresh fruit, vegetables, and low fat dairy, alcohol moderation, and smoking cessation.;Monitor prescription use compliance.    Expected Outcomes  Short Term: Continued assessment and intervention until BP is < 140/11mm HG in hypertensive participants. < 130/81mm HG in hypertensive participants with diabetes, heart failure or chronic kidney disease.;Long Term: Maintenance of blood pressure at goal levels.    Lipids  Yes    Intervention  Provide education and support for participant on nutrition & aerobic/resistive exercise along with prescribed medications to achieve LDL 70mg , HDL >40mg .    Expected Outcomes  Short Term: Participant states understanding of desired cholesterol values and is compliant with medications prescribed. Participant is following exercise prescription and nutrition guidelines.;Long Term: Cholesterol controlled with medications as prescribed, with individualized exercise RX and with personalized nutrition plan. Value goals: LDL < 70mg , HDL > 40 mg.    Stress  Yes    Intervention  Offer individual and/or small group education and counseling on adjustment to heart disease, stress management and health-related lifestyle change. Teach and support self-help strategies.;Refer participants experiencing significant psychosocial distress to appropriate mental health specialists for further evaluation and treatment. When possible, include family members and significant others in education/counseling sessions.    Expected Outcomes  Short Term: Participant demonstrates changes in health-related behavior, relaxation and other stress management skills, ability to obtain effective social support, and compliance with psychotropic medications if prescribed.;Long Term: Emotional  wellbeing is indicated by absence of clinically significant  psychosocial distress or social isolation.       Core Components/Risk Factors/Patient Goals Review:    Core Components/Risk Factors/Patient Goals at Discharge (Final Review):    ITP Comments: ITP Comments    Row Name 05/06/17 0929           ITP Comments  Dr. Fransico Him, Medical Director          Comments: Patient attended orientation from [phone] to 1100  to review rules and guidelines for program. Completed 6 minute walk test, Intitial ITP, and exercise prescription.  VSS. Telemetry-sinus rhythm.  Pt c/o ,mild lightheadedness with walktest, resolved with rest.  Orthostatic VS obtained.  BP:  123/53, HR 59 lying, 124/64, HR-58 sitting, 114/56, HR- 57 standing.  Pt asymptomatic with position change. Pt also c/o mild chest discomfort walking into hospital from parking deck.  However no discomfort with walk test.  Pt does not describe this as her anginal equivalent.  However symptoms concerning to her since they are frequent.   Cecilie Kicks, NP made aware.  Lastly counseling appointment scheduled with Jeanella Craze.  Andi Hence, RN, BSN Cardiac Pulmonary Rehab

## 2017-05-08 DIAGNOSIS — F5101 Primary insomnia: Secondary | ICD-10-CM | POA: Diagnosis not present

## 2017-05-09 ENCOUNTER — Telehealth (HOSPITAL_COMMUNITY): Payer: Self-pay | Admitting: Cardiac Rehabilitation

## 2017-05-09 ENCOUNTER — Ambulatory Visit (HOSPITAL_COMMUNITY): Payer: Self-pay | Admitting: Cardiac Rehabilitation

## 2017-05-09 DIAGNOSIS — I214 Non-ST elevation (NSTEMI) myocardial infarction: Secondary | ICD-10-CM

## 2017-05-09 NOTE — Progress Notes (Signed)
Cardiac Individual Treatment Plan  Patient Details  Name: [name] MRN: [id number] Date of Birth: Mar 19, 1951 Referring Provider:     [place] from 05/06/2017 in [place]  Referring Provider  Daneen Schick MD      Initial Encounter Date:    CARDIAC REHAB PHASE II ORIENTATION from 05/06/2017 in [place]  Date  05/06/17  Referring Provider  Daneen Schick MD      Visit Diagnosis: NSTEMI (non-ST elevated myocardial infarction) [facility])  Patient's Home Medications on Admission:  Current Outpatient Medications:  .  acyclovir (ZOVIRAX) 400 MG tablet, Take 400 mg by mouth 2 (two) times daily., Disp: , Rfl:  .  aspirin 81 MG tablet, Take 81 mg by mouth at bedtime. , Disp: , Rfl:  .  atorvastatin (LIPITOR) 80 MG tablet, Take 40 mg by mouth daily., Disp: , Rfl: 0 .  Cholecalciferol (VITAMIN D) 1000 UNITS capsule, Take 1,000 Units by mouth daily., Disp: , Rfl:  .  dicyclomine (BENTYL) 10 MG capsule, Take 10 mg by mouth 3 (three) times daily as needed for spasms (IBS)., Disp: , Rfl:  .  diltiazem 2 % GEL, Apply 1 application topically as needed. For rectal spasm, Disp: 30 g, Rfl: 1 .  esomeprazole (NEXIUM) 40 MG packet, Take 40 mg by mouth daily before breakfast., Disp: 30 each, Rfl: 6 .  estradiol (CLIMARA - DOSED IN MG/24 HR) 0.025 mg/24hr patch, Place 0.025 mg onto the skin once a week., Disp: , Rfl:  .  ibuprofen (ADVIL,MOTRIN) 200 MG tablet, Take 600 mg by mouth every 6 (six) hours as needed for headache or moderate pain., Disp: , Rfl:  .  loperamide (IMODIUM A-D) 2 MG tablet, Take 2 mg by mouth as needed for diarrhea or loose stools., Disp: , Rfl:  .  LORazepam (ATIVAN) 0.5 MG tablet, Take 0.5 mg by mouth 3 (three) times daily as needed (Espohegeal spasms)., Disp: , Rfl:  .  nebivolol (BYSTOLIC) 2.5 MG tablet, Take 1 tablet (2.5 mg total) by mouth daily., Disp: 30 tablet, Rfl: 5 .  nitroGLYCERIN  (NITROSTAT) 0.4 MG SL tablet, place 1 tablet under the tongue if needed every 5 minutes for chest pain for 3 doses IF NO RELIEF AFTER 3RD DOSE CALL PRESCRIBER OR 911., Disp: 75 tablet, Rfl: 1 .  oxyCODONE (OXY IR/ROXICODONE) 5 MG immediate release tablet, Take 5 mg by mouth as needed for moderate pain. , Disp: , Rfl:  .  Probiotic Product (PROBIOTIC ACIDOPHILUS) CAPS, Take 1 capsule by mouth daily., Disp: , Rfl:  .  QUEtiapine (SEROQUEL) 25 MG tablet, Take 25 mg by mouth at bedtime. For sleep  (pt takes seroquel OR temazepam for sleep), Disp: , Rfl: 0 .  ranolazine (RANEXA) 500 MG 12 hr tablet, Take 1 tablet (500 mg total) by mouth 2 (two) times daily., Disp: 180 tablet, Rfl: 3 .  temazepam (RESTORIL) 15 MG capsule, Take 15 mg by mouth See admin instructions. Approximately twice weekly for sleep instead of seroquel, Disp: , Rfl:   Past Medical History: Past Medical History:  Diagnosis Date  . Anxiety    Psychiatry Dr. Stan Head, concerns for memory loss, anxiety  . Bladder tumor   . 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 Chronic chest pain   . Coronary atherosclerosis CARDIOLOGIST-  DR Daneen Schick   Cath 11/2006 demonstrating moderate obstructive coronary disease with 70-80% septal perforator #1, 50-70% first diagonal, 80%  third diagonal, 50% mid and distal LAD & heavy calcification throughout all 3 coronaries. LVF normal.  . Esophageal motility disorder   . Essential hypertension, benign   . Fatty liver 04/28/12  . GERD (gastroesophageal reflux disease)   . H/O esophageal spasm   . Heart murmur   . History of esophageal spasm    takes ranexa  . History of TIA (transient ischemic attack)    2011--  no residuals  . Hyperlipidemia   . IBS (irritable bowel syndrome)    Diarrhea predominent, esophageal spasm severe, GERD - Dr. Elicia Lamp  . Interstitial cystitis    Dr. Gaynelle Arabian  . Memory loss    Psychiatry Dr. Stan Head, concerns for memory loss, anxiety  . PONV (postoperative nausea and  vomiting)    AND URINARY RETENTION  . Sensation of pressure in bladder area   . TMJ (temporomandibular joint syndrome)   . Wears glasses     Tobacco Use: Social History   Tobacco Use  Smoking Status Former Smoker  . Years: 10.00  . Types: Cigarettes  . Last attempt to quit: 06/10/1973  . Years since quitting: 43.9  Smokeless Tobacco Never Used    Labs: Recent Chemical engineer    Labs for ITP Cardiac and Pulmonary Rehab Latest Ref Rng & Units 09/29/2009 09/30/2009 01/14/2014 05/28/2016 02/18/2017   Cholestrol 0 - 200 mg/dL - 128 ATP III CLASSIFICATION: <200     mg/dL   Desirable 200-239  mg/dL   Borderline High >=240    mg/dL   High - 195 154   LDLCALC 0 - 99 mg/dL - 60 Total Cholesterol/HDL:CHD Risk Coronary Heart Disease Risk Table Men   Women 1/2 Average Risk   3.4   3.3 Average Risk       5.0   4.4 2 X Average Risk   9.6   7.1 3 X Average Risk  23.4   11.0 Use the calculated Patient Ratio above and the CHD Risk Table to determine the patient's CHD Risk. ATP III CLASSIFICATION (LDL): <100     mg/dL   Optimal 100-129  mg/dL   Near or Above Optimal 130-159  mg/dL   Borderline 160-189  mg/dL   High >190     mg/dL   Very High - 112(H) 73   HDL >40 mg/dL - 43 - 67 64   Trlycerides <150 mg/dL - 126 - 80 84   Hemoglobin A1c 4.8 - 5.6 % - 5.4 (NOTE)                                                                       According to the ADA Clinical Practice Recommendations for 2011, when HbA1c is used as a screening test:   >=6.5%   Diagnostic of Diabetes Mellitus           (if abnormal result is confirmed)  5.7-6.4%   Increased risk of developing Diabetes Mellitus  References:Diagnosis and Classification of Diabetes Mellitus,Diabetes YIRS,8546,27(OJJKK 1):S62-S69 and Standards of Medical Care in         Diabetes - 2011,Diabetes Care,2011,34 (Suppl 1):S11-S61. - - 5.2   TCO2 0 - 100 mmol/L 25 - 25 - -      Capillary Blood Glucose: Lab Results  Component Value Date    GLUCAP 90 02/20/2017   GLUCAP 94 02/19/2017   GLUCAP 98 09/30/2009     Exercise Target Goals:    Exercise Program Goal: Individual exercise prescription set with THRR, safety & activity barriers. Participant demonstrates ability to understand and report RPE using BORG scale, to self-measure pulse accurately, and to acknowledge the importance of the exercise prescription.  Exercise Prescription Goal: Starting with aerobic activity 30 plus minutes a day, 3 days per week for initial exercise prescription. Provide home exercise prescription and guidelines that participant acknowledges understanding prior to discharge.  Activity Barriers & Risk Stratification: Activity Barriers & Cardiac Risk Stratification - 05/06/17 1229      Activity Barriers & Cardiac Risk Stratification   Cardiac Risk Stratification  High       6 Minute Walk: 6 Minute Walk    Row Name 05/06/17 1226         6 Minute Walk   Phase  Initial     Distance  1664 feet     Walk Time  6 minutes     # of Rest Breaks  0     MPH  3.15     METS  3.72     RPE  12     VO2 Peak  13.01     Symptoms  Yes (comment)     Comments  anginal discomfort 1-2 out of 10. Pt baseline is 1-2 out of 10 at sitting and walking on a flat surface     Resting HR  56 bpm     Resting BP  112/60     Resting Oxygen Saturation   99 %     Exercise Oxygen Saturation  during 6 min walk  98 %     Max Ex. HR  83 bpm     Max Ex. BP  120/70     2 Minute Post BP  114/76        Oxygen Initial Assessment:   Oxygen Re-Evaluation:   Oxygen Discharge (Final Oxygen Re-Evaluation):   Initial Exercise Prescription: Initial Exercise Prescription - 05/06/17 1200      Date of Initial Exercise RX and Referring Provider   Date  05/06/17    Referring Provider  Daneen Schick MD      Treadmill   MPH  2.8    Grade  0    Minutes  15    METs  3.14      Bike   Minutes  15      NuStep   Level  3    SPM  80    Minutes  15    METs  2.5       Prescription Details   Frequency (times per week)  3    Duration  Progress to 30 minutes of continuous aerobic without signs/symptoms of physical distress      Intensity   THRR 40-80% of Max Heartrate  62-124    Ratings of Perceived Exertion  11-13    Perceived Dyspnea  0-4      Progression   Progression  Continue to progress workloads to maintain intensity without signs/symptoms of physical distress.      Resistance Training   Training Prescription  Yes    Weight  3lbs    Reps  10-15       Perform Capillary Blood Glucose checks as needed.  Exercise Prescription Changes: Exercise Prescription Changes    Row Name 05/06/17 1200  Bike   Level  2.5 upright scifit       METs  2.5          Exercise Comments:   Exercise Goals and Review: Exercise Goals    Row Name 05/06/17 1231             Exercise Goals   Increase Physical Activity  Yes       Intervention  Provide advice, education, support and counseling about physical activity/exercise needs.;Develop an individualized exercise prescription for aerobic and resistive training based on initial evaluation findings, risk stratification, comorbidities and participant's personal goals.       Expected Outcomes  Achievement of increased cardiorespiratory fitness and enhanced flexibility, muscular endurance and strength shown through measurements of functional capacity and personal statement of participant.       Increase Strength and Stamina  Yes return to gym-like routine       Intervention  Provide advice, education, support and counseling about physical activity/exercise needs.;Develop an individualized exercise prescription for aerobic and resistive training based on initial evaluation findings, risk stratification, comorbidities and participant's personal goals.       Expected Outcomes  Achievement of increased cardiorespiratory fitness and enhanced flexibility, muscular endurance and strength shown through  measurements of functional capacity and personal statement of participant.       Able to understand and use rate of perceived exertion (RPE) scale  Yes       Intervention  Provide education and explanation on how to use RPE scale       Expected Outcomes  Short Term: Able to use RPE daily in rehab to express subjective intensity level;Long Term:  Able to use RPE to guide intensity level when exercising independently       Knowledge and understanding of Target Heart Rate Range (THRR)  Yes       Intervention  Provide education and explanation of THRR including how the numbers were predicted and where they are located for reference       Expected Outcomes  Short Term: Able to state/look up THRR;Long Term: Able to use THRR to govern intensity when exercising independently;Short Term: Able to use daily as guideline for intensity in rehab       Able to check pulse independently  Yes       Intervention  Provide education and demonstration on how to check pulse in carotid and radial arteries.;Review the importance of being able to check your own pulse for safety during independent exercise       Expected Outcomes  Short Term: Able to explain why pulse checking is important during independent exercise;Long Term: Able to check pulse independently and accurately       Understanding of Exercise Prescription  Yes       Intervention  Provide education, explanation, and written materials on patient's individual exercise prescription       Expected Outcomes  Short Term: Able to explain program exercise prescription;Long Term: Able to explain home exercise prescription to exercise independently          Exercise Goals Re-Evaluation :    Discharge Exercise Prescription (Final Exercise Prescription Changes): Exercise Prescription Changes - 05/06/17 1200      Bike   Level  2.5 upright scifit    METs  2.5       Nutrition:  Target Goals: Understanding of nutrition guidelines, daily intake of sodium 1500mg ,  cholesterol 200mg , calories 30% from fat and 7% or less from saturated fats, daily to have  5 or more servings of fruits and vegetables.  Biometrics: Pre Biometrics - 05/06/17 1231      Pre Biometrics   Height  5' 3.25" (1.607 m)    Weight  132 lb 11.5 oz (60.2 kg)    Waist Circumference  30.5 inches    Hip Circumference  38 inches    Waist to Hip Ratio  0.8 %    BMI (Calculated)  23.31    Triceps Skinfold  25 mm    % Body Fat  34.4 %    Grip Strength  22 kg    Flexibility  13 in    Single Leg Stand  10 seconds        Nutrition Therapy Plan and Nutrition Goals:   Nutrition Discharge: Nutrition Scores:   Nutrition Goals Re-Evaluation:   Nutrition Goals Re-Evaluation:   Nutrition Goals Discharge (Final Nutrition Goals Re-Evaluation):   Psychosocial: Target Goals: Acknowledge presence or absence of significant depression and/or stress, maximize coping skills, provide positive support system. Participant is able to verbalize types and ability to use techniques and skills needed for reducing stress and depression.  Initial Review & Psychosocial Screening: Initial Psych Review & Screening - 05/06/17 0956      Initial Review   Current issues with  Current Anxiety/Panic health related anxiety       Family Dynamics   Good Support System?  Yes spouse,family. friends     Comments  pt with health related anxiety and situational stress.  pt tearful and verbalizes feeling overwhelmed.        Barriers   Psychosocial barriers to participate in program  There are no identifiable barriers or psychosocial needs.      Screening Interventions   Interventions  Encouraged to exercise;Provide feedback about the scores to participant;Program counselor consult Jeanella Craze appt 05/16/2017 @ 2:30       Quality of Life Scores: Quality of Life - 05/06/17 1000      Quality of Life Scores   Health/Function Pre  17.47 %    Socioeconomic Pre  26.5 %    Psych/Spiritual Pre  22.43 %     Family Pre  15.6 %    GLOBAL Pre  20.26 %       PHQ-9: Recent Review Flowsheet Data    There is no flowsheet data to display.     Interpretation of Total Score  Total Score Depression Severity:  1-4 = Minimal depression, 5-9 = Mild depression, 10-14 = Moderate depression, 15-19 = Moderately severe depression, 20-27 = Severe depression   Psychosocial Evaluation and Intervention: Psychosocial Evaluation - 05/09/17 1714      Psychosocial Evaluation & Interventions   Interventions  Encouraged to exercise with the program and follow exercise prescription;Stress management education;Relaxation education;Therapist referral    Comments  pt with health related anxiety.  counseling appointment scheduled with Jeanella Craze.     Expected Outcomes  pt will exhibit improved outlook with good coping skills.     Continue Psychosocial Services   Follow up required by staff       Psychosocial Re-Evaluation:   Psychosocial Discharge (Final Psychosocial Re-Evaluation):   Vocational Rehabilitation: Provide vocational rehab assistance to qualifying candidates.   Vocational Rehab Evaluation & Intervention: Vocational Rehab - 05/06/17 0954      Initial Vocational Rehab Evaluation & Intervention   Assessment shows need for Vocational Rehabilitation  No N/A       Education: Education Goals: Education classes will be provided on a weekly basis,  covering required topics. Participant will state understanding/return demonstration of topics presented.  Learning Barriers/Preferences: Learning Barriers/Preferences - 05/06/17 0931      Learning Barriers/Preferences   Learning Barriers  Sight    Learning Preferences  Video;Pictoral;Computer/Internet       Education Topics: Count Your Pulse:  -Group instruction provided by verbal instruction, demonstration, patient participation and written materials to support subject.  Instructors address importance of being able to find your pulse and how to  count your pulse when at home without a heart monitor.  Patients get hands on experience counting their pulse with staff help and individually.   Heart Attack, Angina, and Risk Factor Modification:  -Group instruction provided by verbal instruction, video, and written materials to support subject.  Instructors address signs and symptoms of angina and heart attacks.    Also discuss risk factors for heart disease and how to make changes to improve heart health risk factors.   Functional Fitness:  -Group instruction provided by verbal instruction, demonstration, patient participation, and written materials to support subject.  Instructors address safety measures for doing things around the house.  Discuss how to get up and down off the floor, how to pick things up properly, how to safely get out of a chair without assistance, and balance training.   Meditation and Mindfulness:  -Group instruction provided by verbal instruction, patient participation, and written materials to support subject.  Instructor addresses importance of mindfulness and meditation practice to help reduce stress and improve awareness.  Instructor also leads participants through a meditation exercise.    Stretching for Flexibility and Mobility:  -Group instruction provided by verbal instruction, patient participation, and written materials to support subject.  Instructors lead participants through series of stretches that are designed to increase flexibility thus improving mobility.  These stretches are additional exercise for major muscle groups that are typically performed during regular warm up and cool down.   Hands Only CPR:  -Group verbal, video, and participation provides a basic overview of AHA guidelines for community CPR. Role-play of emergencies allow participants the opportunity to practice calling for help and chest compression technique with discussion of AED use.   Hypertension: -Group verbal and written  instruction that provides a basic overview of hypertension including the most recent diagnostic guidelines, risk factor reduction with self-care instructions and medication management.    Nutrition I class: Heart Healthy Eating:  -Group instruction provided by PowerPoint slides, verbal discussion, and written materials to support subject matter. The instructor gives an explanation and review of the Therapeutic Lifestyle Changes diet recommendations, which includes a discussion on lipid goals, dietary fat, sodium, fiber, plant stanol/sterol esters, sugar, and the components of a well-balanced, healthy diet.   Nutrition II class: Lifestyle Skills:  -Group instruction provided by PowerPoint slides, verbal discussion, and written materials to support subject matter. The instructor gives an explanation and review of label reading, grocery shopping for heart health, heart healthy recipe modifications, and ways to make healthier choices when eating out.   Diabetes Question & Answer:  -Group instruction provided by PowerPoint slides, verbal discussion, and written materials to support subject matter. The instructor gives an explanation and review of diabetes co-morbidities, pre- and post-prandial blood glucose goals, pre-exercise blood glucose goals, signs, symptoms, and treatment of hypoglycemia and hyperglycemia, and foot care basics.   Diabetes Blitz:  -Group instruction provided by PowerPoint slides, verbal discussion, and written materials to support subject matter. The instructor gives an explanation and review of the physiology behind type 1  and type 2 diabetes, diabetes medications and rational behind using different medications, pre- and post-prandial blood glucose recommendations and Hemoglobin A1c goals, diabetes diet, and exercise including blood glucose guidelines for exercising safely.    Portion Distortion:  -Group instruction provided by PowerPoint slides, verbal discussion, written  materials, and food models to support subject matter. The instructor gives an explanation of serving size versus portion size, changes in portions sizes over the last 20 years, and what consists of a serving from each food group.   Stress Management:  -Group instruction provided by verbal instruction, video, and written materials to support subject matter.  Instructors review role of stress in heart disease and how to cope with stress positively.     Exercising on Your Own:  -Group instruction provided by verbal instruction, power point, and written materials to support subject.  Instructors discuss benefits of exercise, components of exercise, frequency and intensity of exercise, and end points for exercise.  Also discuss use of nitroglycerin and activating EMS.  Review options of places to exercise outside of rehab.  Review guidelines for sex with heart disease.   Cardiac Drugs I:  -Group instruction provided by verbal instruction and written materials to support subject.  Instructor reviews cardiac drug classes: antiplatelets, anticoagulants, beta blockers, and statins.  Instructor discusses reasons, side effects, and lifestyle considerations for each drug class.   Cardiac Drugs II:  -Group instruction provided by verbal instruction and written materials to support subject.  Instructor reviews cardiac drug classes: angiotensin converting enzyme inhibitors (ACE-I), angiotensin II receptor blockers (ARBs), nitrates, and calcium channel blockers.  Instructor discusses reasons, side effects, and lifestyle considerations for each drug class.   Anatomy and Physiology of the Circulatory System:  Group verbal and written instruction and models provide basic cardiac anatomy and physiology, with the coronary electrical and arterial systems. Review of: AMI, Angina, Valve disease, Heart Failure, Peripheral Artery Disease, Cardiac Arrhythmia, Pacemakers, and the ICD.   Other Education:  -Group or  individual verbal, written, or video instructions that support the educational goals of the cardiac rehab program.   Knowledge Questionnaire Score: Knowledge Questionnaire Score - 05/06/17 0955      Knowledge Questionnaire Score   Pre Score  23/24       Core Components/Risk Factors/Patient Goals at Admission: Personal Goals and Risk Factors at Admission - 05/06/17 1232      Core Components/Risk Factors/Patient Goals on Admission   Hypertension  Yes    Intervention  Provide education on lifestyle modifcations including regular physical activity/exercise, weight management, moderate sodium restriction and increased consumption of fresh fruit, vegetables, and low fat dairy, alcohol moderation, and smoking cessation.;Monitor prescription use compliance.    Expected Outcomes  Short Term: Continued assessment and intervention until BP is < 140/64mm HG in hypertensive participants. < 130/16mm HG in hypertensive participants with diabetes, heart failure or chronic kidney disease.;Long Term: Maintenance of blood pressure at goal levels.    Lipids  Yes    Intervention  Provide education and support for participant on nutrition & aerobic/resistive exercise along with prescribed medications to achieve LDL 70mg , HDL >40mg .    Expected Outcomes  Short Term: Participant states understanding of desired cholesterol values and is compliant with medications prescribed. Participant is following exercise prescription and nutrition guidelines.;Long Term: Cholesterol controlled with medications as prescribed, with individualized exercise RX and with personalized nutrition plan. Value goals: LDL < 70mg , HDL > 40 mg.    Stress  Yes    Intervention  Offer individual  and/or small group education and counseling on adjustment to heart disease, stress management and health-related lifestyle change. Teach and support self-help strategies.;Refer participants experiencing significant psychosocial distress to appropriate  mental health specialists for further evaluation and treatment. When possible, include family members and significant others in education/counseling sessions.    Expected Outcomes  Short Term: Participant demonstrates changes in health-related behavior, relaxation and other stress management skills, ability to obtain effective social support, and compliance with psychotropic medications if prescribed.;Long Term: Emotional wellbeing is indicated by absence of clinically significant psychosocial distress or social isolation.       Core Components/Risk Factors/Patient Goals Review:  Goals and Risk Factor Review    Row Name 05/09/17 1713             Core Components/Risk Factors/Patient Goals Review   Personal Goals Review  Hypertension;Lipids;Stress       Review  pt with known CAD and multiple RF demonstrates eagerness to participate in CR activities.        Expected Outcomes  pt will participate in CR exercise, nutrition and lifestyle modification opportunities.           Core Components/Risk Factors/Patient Goals at Discharge (Final Review):  Goals and Risk Factor Review - 05/09/17 1713      Core Components/Risk Factors/Patient Goals Review   Personal Goals Review  Hypertension;Lipids;Stress    Review  pt with known CAD and multiple RF demonstrates eagerness to participate in CR activities.     Expected Outcomes  pt will participate in CR exercise, nutrition and lifestyle modification opportunities.        ITP Comments: ITP Comments    Row Name 05/06/17 9030 05/09/17 1712         ITP Comments  Dr. Fransico Him, Medical Director  30 day ITP review. pt scheduled to begin group exercise 05/12/2017.         Comments:

## 2017-05-09 NOTE — Telephone Encounter (Signed)
-----   Message from [name], NP sent at 05/07/2017  9:59 AM EST ----- Regarding: FW: cardiac rehab  See Dr. Thompson Caul note.  Thanks. [name]  ----- Message ----- From: Belva Crome, MD Sent: 05/07/2017   9:55 AM To: [name], NP Subject: RE: cardiac rehab                              I think these results are reassuring.  I believe it is safe for her to exercise.  Further adjustments in medical regimen at this point. ----- Message ----- From: [name], NP Sent: 05/07/2017   8:47 AM To: Belva Crome, MD Subject: [place] Alar: cardiac rehab                              Dr. Tamala Julian, do I just reassure her?    ----- Message ----- From: Lowell Guitar, RN Sent: 05/06/2017  11:44 AM To: [name], NP Subject: cardiac rehab                                  Dear [name],  Pt arrived at cardiac rehab orientation c/o mild chest discomfort while walking into hospital.  Rates 1/10.  Describes as "awareness of sensation".  Upon more evaluation, pt reports these symptoms are chronic for her, however disturbing for her in that they are persistent, daily; worse at night and in the morning.  Since last office visit with you and starting bystolic, she  reports decreased NTG SL use (average 1 per week).    Pt also notes since her NSTEMI  hospitalization she has more "awareness of her heart" and more symptoms with NTG SL use ie headache and chest fullness.  Of note, Pt describes this feeling as the same sensation she had when taking ranexa 1000mg  daily.  Pt also reports episodes of bounding heartbeat , however denies tachy palpitations.    Pt tolerated 6 minute walk test without increase in chest sensations. Pt did c/o mild lightheadedness post walk test.   Orthostat VS obtained:   BP:  123/53     HR:    59     Lying  BP:  124/64  HR:   58     Sitting  BP:  114/56   HR:   57     Standing   No change in symptoms with position change.  Pt given water with relief of symptoms.     NTG SL use  (average 1 per week).  Pt also notes since her hospitalization she has more "awareness of her heart" and more symptoms with NTG SL use ie headache and chest fullness.  Pt also reports episodes of bounding heartbeat , however denies tachy palpitations.    Pt has previously scheduled appt with you on  05/12/2017.  I wanted to make you aware of these symptoms prior to that appointment.   Also wanted to see if you recommend medication adjustments. Pt would like to decrease bystolic.  Importance of medication therapy for heart health and BP control  discussed with patient   In addition, Pt  offered emotional support, reassurance and counseling appt for situational stress and anxiety.    Please advise.  Thank you, Andi Hence, RN, BSN Cardiac Pulmonary Rehab

## 2017-05-09 NOTE — Progress Notes (Signed)
Cardiology Office Note   Date:  05/12/2017   ID:  [name], DOB 06/18/1950, MRN [id number]  PCP:  Leeroy Cha, MD  Cardiologist:  Dr. Tamala Julian    Chief Complaint  Patient presents with  . Chest Pain    HTN      History of Present Illness: [name] is a 66 y.o. female who presents for chronic chest pain with exertion.  She has been unable to titrate up her meds due to low BP.    She hasa hx of diffuse CAD, and Cerebral vascular disease. HLD, HTN, and anxiety disorder. Other hx of bladder cancer.   In May 2018 she was seen for tachycardia and hypotension at times and exertional SOB at times.   Pt underwent cardiac cath 02/18/17 and EF 25-35%, Severe diffuse calcification of the proximal to mLAD 30-40% pLAD, 60-70% pLAD, sotial 80-90% first diag. With mild luminal irregularties in RCA and LCX. No significant change in cath other than decrease of EF. Medical management of stress cardiomyopathy. Echo with same hospitaliztion with EF 40-45% with akinesis of distal anteroseptal, anterolateral, inferolateral, inferoseptal, and apical myocardium with basal to mid sparing of contractile performance. Pulmonary arteries: Systolic pressure was mildly increased. PA peak pressure: 36 mm Hg (S).  She has been intolerant to BB, she is on Ranexa.  Last night she had funny feeling in her chest and "flutters" for about an hour.  BP elevated to 154/77.  With walking las week she had chest discomfort and took NTG with relief she has slowed her activity just when she thought she was better.  She is wearing a monitor now  And was wearing last night.  She believes she pressed button.  She did not tolerate toprol low dose started on Bystolic.    Today overall she feels much better.  No further palpitations on bystolic and no spikes in BP.  She is worried that her BP will be low some days and it may be, but the balance is she has less problems on BB. Has exercised without much  chest pain.  Her cardiomyopathy has resolved.      Past Medical History:  Diagnosis Date  . Anxiety    Psychiatry Dr. Stan Head, concerns for memory loss, anxiety  . Bladder tumor   . Cancer ([place])    bladder CA  . Chronic chest pain   . Coronary atherosclerosis CARDIOLOGIST-  DR Daneen Schick   Cath 11/2006 demonstrating moderate obstructive coronary disease with 70-80% septal perforator #1, 50-70% first diagonal, 80% third diagonal, 50% mid and distal LAD & heavy calcification throughout all 3 coronaries. LVF normal.  . Esophageal motility disorder   . Essential hypertension, benign   . Fatty liver 04/28/12  . GERD (gastroesophageal reflux disease)   . H/O esophageal spasm   . Heart murmur   . History of esophageal spasm    takes ranexa  . History of TIA (transient ischemic attack)    2011--  no residuals  . Hyperlipidemia   . IBS (irritable bowel syndrome)    Diarrhea predominent, esophageal spasm severe, GERD - Dr. Elicia Lamp  . Interstitial cystitis    Dr. Gaynelle Arabian  . Memory loss    Psychiatry Dr. Stan Head, concerns for memory loss, anxiety  . PONV (postoperative nausea and vomiting)    AND URINARY RETENTION  . Sensation of pressure in bladder area   . TMJ (temporomandibular joint syndrome)   . Wears glasses     Past  Surgical History:  Procedure Laterality Date  . ABDOMINAL HYSTERECTOMY  AGE 46 -- 1989  . APPENDECTOMY  1971   AND OVARIAN CYSTECTOMY  . BENIGN EXCISIONAL BREAST BX  1996  . CARDIAC CATHETERIZATION  11-28-2006   DR Daneen Schick    moderate cad/  70-80% septal perforator #1, 50-70% first diagonal, 80% third diagonal, 50% mid and distal LAD & heavy calcification throughout all 3 coronaries. LVF normal.  . CARDIAC CATHETERIZATION  03-26-2012   DR Daneen Schick   patent CFX,  RCA,  & pLAD/  mLAD >50% to <70% a focal eccentric region that overlaps the third diagonal/  90% diagonal ostial and unchanged from prior study /   ef 65%  . CYSTO WITH  HYDRODISTENSION N/A 01/14/2014   Procedure: CYSTOSCOPY/HYDRODISTENSION/INSERTION OF MARCAINE AND PYRIDUIM INTO BLADDER/INJECTION OF MARCAINE AND KENALOG into sub trigone space ;  Surgeon: Ailene Rud, MD;  Location: [facility];  Service: Urology;  Laterality: N/A;  . CYSTO/  HYDRODISTENTION/  INSTILLATION THERAPY  03-12-2010  . ESOPHAGOGASTRODUODENOSCOPY  04/30/2012   Procedure: ESOPHAGOGASTRODUODENOSCOPY (EGD);  Surgeon: Lafayette Dragon, MD;  Location: [facility];  Service: Endoscopy;  Laterality: N/A;  . LEFT HEART CATH AND CORONARY ANGIOGRAPHY N/A 02/18/2017   Procedure: LEFT HEART CATH AND CORONARY ANGIOGRAPHY;  Surgeon: Belva Crome, MD;  Location: [facility];  Service: Cardiovascular;  Laterality: N/A;  . LUMBAR [place]  . NEGATIVE SLEEP STUDY  2010  . SHOULDER ARTHROSCOPY Left 12-23-2011  . TRANSTHORACIC ECHOCARDIOGRAM  04-21-2012   GRADE I DIASTOLIC DYSFUNCTION/  EF 60-65%/  MILD MR  &  TR  . TRANSURETHRAL RESECTION OF BLADDER TUMOR Right 01/03/2015   Procedure: TRANSURETHRAL RESECTION OF BLADDER TUMOR (TURBT);  Surgeon: Carolan Clines, MD;  Location: [facility];  Service: Urology;  Laterality: Right;     Current Outpatient Medications  Medication Sig Dispense Refill  . acyclovir (ZOVIRAX) 400 MG tablet Take 400 mg by mouth 2 (two) times daily.    Marland Kitchen aspirin 81 MG tablet Take 81 mg by mouth at bedtime.     Marland Kitchen atorvastatin (LIPITOR) 80 MG tablet Take 40 mg by mouth daily.  0  . Cholecalciferol (VITAMIN D) 1000 UNITS capsule Take 1,000 Units by mouth daily.    Marland Kitchen dicyclomine (BENTYL) 10 MG capsule Take 10 mg by mouth 3 (three) times daily as needed for spasms (IBS).    Marland Kitchen diltiazem 2 % GEL Apply 1 application topically as needed. For rectal spasm 30 g 1  . esomeprazole (NEXIUM) 40 MG packet Take 40 mg by mouth daily before breakfast. 30 each 6  . estradiol (CLIMARA - DOSED IN MG/24 HR) 0.025 mg/24hr patch Place 0.025 mg onto  the skin once a week.    Marland Kitchen ibuprofen (ADVIL,MOTRIN) 200 MG tablet Take 600 mg by mouth every 6 (six) hours as needed for headache or moderate pain.    Marland Kitchen loperamide (IMODIUM A-D) 2 MG tablet Take 2 mg by mouth as needed for diarrhea or loose stools.    Marland Kitchen LORazepam (ATIVAN) 0.5 MG tablet Take 0.5 mg by mouth 3 (three) times daily as needed (Espohegeal spasms).    . nebivolol (BYSTOLIC) 2.5 MG tablet Take 1 tablet (2.5 mg total) by mouth daily. 30 tablet 5  . nitroGLYCERIN (NITROSTAT) 0.4 MG SL tablet place 1 tablet under the tongue if needed every 5 minutes for chest pain for 3 doses IF NO RELIEF AFTER 3RD DOSE CALL PRESCRIBER OR 911.  75 tablet 1  . oxyCODONE (OXY IR/ROXICODONE) 5 MG immediate release tablet Take 5 mg by mouth as needed for moderate pain.     . Probiotic Product (PROBIOTIC ACIDOPHILUS) CAPS Take 1 capsule by mouth daily.    . QUEtiapine (SEROQUEL) 25 MG tablet Take 25 mg by mouth at bedtime. For sleep  (pt takes seroquel OR temazepam for sleep)  0  . ranolazine (RANEXA) 500 MG 12 hr tablet Take 1 tablet (500 mg total) by mouth 2 (two) times daily. 180 tablet 3  . temazepam (RESTORIL) 15 MG capsule Take 15 mg by mouth See admin instructions. Approximately twice weekly for sleep instead of seroquel     No current facility-administered medications for this visit.     Allergies:   Clindamycin/lincomycin; Doxycycline; Escitalopram oxalate; Hyoscyamine; Macrodantin; Trazodone; Trazodone and nefazodone; Trimethoprim; Levsin [hyoscyamine sulfate]; Lincomycin; Nitrofurantoin; and Nitrofurantoin macrocrystal    Social History:  The patient  reports that she quit smoking about 43 years ago. Her smoking use included cigarettes. She quit after 10.00 years of use. she has never used smokeless tobacco. She reports that she drinks about 1.2 oz of alcohol per week. She reports that she does not use drugs.   Family History:  The patient's family history includes Alzheimer's disease in her mother;  CAD in her father and mother; Colon polyps in her mother; Dementia in her mother; Depression in her other; Diverticulosis in her father; Heart disease in her brother, father, and mother; Obesity in her other; Osteoarthritis in her other.    ROS:  General:no colds or fevers, no weight changes Skin:no rashes or ulcers HEENT:no blurred vision, no congestion CV:see HPI PUL:see HPI GI:no diarrhea constipation or melena, no indigestion GU:no hematuria, no dysuria MS:no joint pain, no claudication Neuro:no syncope, no lightheadedness Endo:no diabetes, no thyroid disease  Wt Readings from Last 3 Encounters:  05/12/17 133 lb 6.4 oz (60.5 kg)  05/06/17 132 lb 11.5 oz (60.2 kg)  04/07/17 131 lb 12.8 oz (59.8 kg)     PHYSICAL EXAM: VS:  BP 110/62   Pulse 60   Resp 16   Ht 5' 2.5" (1.588 m)   Wt 133 lb 6.4 oz (60.5 kg)   BMI 24.01 kg/m  , BMI Body mass index is 24.01 kg/m. General:Pleasant affect, NAD Skin:Warm and dry, brisk capillary refill HEENT:normocephalic, sclera clear, mucus membranes moist Neck:supple, no JVD, no bruits  Heart:S1S2 RRR without murmur, gallup, rub or click Lungs:clear without rales, rhonchi, or wheezes ELF:YBOF, non tender, + BS, do not palpate liver spleen or masses Ext:no lower ext edema, 2+ pedal pulses, 2+ radial pulses Neuro:alert and oriented, MAE, follows commands, + facial symmetry    EKG:  EKG is NOT ordered today.    Recent Labs: 11/06/2016: Magnesium 2.1 02/18/2017: ALT 26; B Natriuretic Peptide 553.5; BUN 9; Creatinine, Ser 0.62; Potassium 3.8; Sodium 138; TSH 2.526 02/20/2017: Hemoglobin 9.8; Platelets 205    Lipid Panel    Component Value Date/Time   CHOL 154 02/18/2017 0824   TRIG 84 02/18/2017 0824   HDL 64 02/18/2017 0824   CHOLHDL 2.4 02/18/2017 0824   VLDL 17 02/18/2017 0824   LDLCALC 73 02/18/2017 0824       Other studies Reviewed: Additional studies/ records that were reviewed today include: see previous  note.   ASSESSMENT AND PLAN:  1. Chest pain back to her baseline.  Overall better.  Follow up in 6 months.   2.  Cardiomyopathy resolved  3.  CAD non obstructive  4.  HLD on statin  5.  Episodic HTN controlled with bystolic and so are palpitations.   Stress may be a part of this as well.    Current medicines are reviewed with the patient today.  The patient Has no concerns regarding medicines.  The following changes have been made:  See above Labs/ tests ordered today include:see above  Disposition:   FU:  see above  Signed, Cecilie Kicks, NP  05/12/2017 9:32 AM    [place], [place], [place] Phone: [phone]; Fax: [phone]

## 2017-05-12 ENCOUNTER — Encounter: Payer: Self-pay | Admitting: Cardiology

## 2017-05-12 ENCOUNTER — Ambulatory Visit (INDEPENDENT_AMBULATORY_CARE_PROVIDER_SITE_OTHER): Payer: Medicare Other | Admitting: Cardiology

## 2017-05-12 ENCOUNTER — Encounter (HOSPITAL_COMMUNITY)
Admission: RE | Admit: 2017-05-12 | Discharge: 2017-05-12 | Disposition: A | Discharge status: Home / Self Care | Payer: Medicare Other | Source: Ambulatory Visit | Attending: Interventional Cardiology | Admitting: Interventional Cardiology

## 2017-05-12 VITALS — BP 110/62 | HR 60 | Resp 16 | Ht 62.5 in | Wt 133.4 lb

## 2017-05-12 DIAGNOSIS — R002 Palpitations: Secondary | ICD-10-CM | POA: Diagnosis not present

## 2017-05-12 DIAGNOSIS — I428 Other cardiomyopathies: Secondary | ICD-10-CM | POA: Diagnosis not present

## 2017-05-12 DIAGNOSIS — I2510 Atherosclerotic heart disease of native coronary artery without angina pectoris: Secondary | ICD-10-CM

## 2017-05-12 DIAGNOSIS — I214 Non-ST elevation (NSTEMI) myocardial infarction: Secondary | ICD-10-CM | POA: Insufficient documentation

## 2017-05-12 DIAGNOSIS — I10 Essential (primary) hypertension: Secondary | ICD-10-CM | POA: Diagnosis not present

## 2017-05-12 DIAGNOSIS — I5181 Takotsubo syndrome: Secondary | ICD-10-CM | POA: Insufficient documentation

## 2017-05-12 DIAGNOSIS — I6350 Cerebral infarction due to unspecified occlusion or stenosis of unspecified cerebral artery: Secondary | ICD-10-CM

## 2017-05-12 NOTE — Progress Notes (Signed)
[name] 66 y.o. female DOB 1950/09/03 MRN [id number]       Nutrition  1. NSTEMI (non-ST elevated myocardial infarction) [facility])    Past Medical History:  Diagnosis Date  . Anxiety    Psychiatry Dr. Stan Head, concerns for memory loss, anxiety  . Bladder tumor   . Cancer ([place])    bladder CA  . Chronic chest pain   . Coronary atherosclerosis CARDIOLOGIST-  DR Daneen Schick   Cath 11/2006 demonstrating moderate obstructive coronary disease with 70-80% septal perforator #1, 50-70% first diagonal, 80% third diagonal, 50% mid and distal LAD & heavy calcification throughout all 3 coronaries. LVF normal.  . Esophageal motility disorder   . Essential hypertension, benign   . Fatty liver 04/28/12  . GERD (gastroesophageal reflux disease)   . H/O esophageal spasm   . Heart murmur   . History of esophageal spasm    takes ranexa  . History of TIA (transient ischemic attack)    2011--  no residuals  . Hyperlipidemia   . IBS (irritable bowel syndrome)    Diarrhea predominent, esophageal spasm severe, GERD - Dr. Elicia Lamp  . Interstitial cystitis    Dr. Gaynelle Arabian  . Memory loss    Psychiatry Dr. Stan Head, concerns for memory loss, anxiety  . PONV (postoperative nausea and vomiting)    AND URINARY RETENTION  . Sensation of pressure in bladder area   . TMJ (temporomandibular joint syndrome)   . Wears glasses    Meds reviewed.   HT: Ht Readings from Last 1 Encounters:  05/12/17 5' 2.5" (1.588 m)    WT: Wt Readings from Last 3 Encounters:  05/12/17 133 lb 6.4 oz (60.5 kg)  05/06/17 132 lb 11.5 oz (60.2 kg)  04/07/17 131 lb 12.8 oz (59.8 kg)     BMI 24.0   Current tobacco use? No  Labs:  Lipid Panel     Component Value Date/Time   CHOL 154 02/18/2017 0824   TRIG 84 02/18/2017 0824   HDL 64 02/18/2017 0824   CHOLHDL 2.4 02/18/2017 0824   VLDL 17 02/18/2017 0824   LDLCALC 73 02/18/2017 0824    Lab Results  Component Value Date   HGBA1C 5.2 02/18/2017   CBG  (last 3)  No results for input(s): GLUCAP in the last 72 hours.  Nutrition Diagnosis ? Food-and nutrition-related knowledge deficit related to lack of exposure to information as related to diagnosis of: ? CVD   Nutrition Goal(s):  ? Pt to identify food quantities necessary to achieve pt's desired weight loss.    Plan:  Pt to attend nutrition classes ? Nutrition I ? Nutrition II ? Portion Distortion  Will provide client-centered nutrition education as part of interdisciplinary care.   Monitor and evaluate progress toward nutrition goal with team.  Derek Mound, M.Ed, RD, LDN, CDE 05/12/2017 11:12 AM

## 2017-05-12 NOTE — Progress Notes (Signed)
Daily Session Note  Patient Details  Name: [name] MRN: [id number] Date of Birth: November 11, 1950 Referring Provider:     [place] from 05/06/2017 in [place]  Referring Provider  Daneen Schick MD      Encounter Date: 05/12/2017  Check In: Session Check In - 05/12/17 1416      Check-In   Location  [facility]    Staff Present  Dorma Russell, MS,ACSM CEP, Exercise Physiologist;Maria Whitaker, RN, BSN;Amber Fair, MS, ACSM RCEP, Exercise Physiologist;Lisa Ysidro Evert, RN    Supervising physician immediately available to respond to emergencies  [facility] immediately available    Physician(s)  Dr Nevada Crane    Medication changes reported      No    Fall or balance concerns reported     No    Tobacco Cessation  No Change    Warm-up and Cool-down  Performed as group-led instruction    Resistance Training Performed  Yes    VAD Patient?  No      Pain Assessment   Currently in Pain?  No/denies       Capillary Blood Glucose: No results found for this or any previous visit (from the past 24 hour(s)).  Exercise Prescription Changes - 05/12/17 1600      Response to Exercise   Blood Pressure (Admit)  112/60    Blood Pressure (Exercise)  128/78    Blood Pressure (Exit)  118/80    Heart Rate (Admit)  69 bpm    Heart Rate (Exercise)  86 bpm    Heart Rate (Exit)  60 bpm    Rating of Perceived Exertion (Exercise)  11    Symptoms  none    Comments  pt oriented to exercise equipment today    Duration  Continue with 30 min of aerobic exercise without signs/symptoms of physical distress.    Intensity  THRR unchanged      Progression   Progression  Continue to progress workloads to maintain intensity without signs/symptoms of physical distress.    Average METs  3.5      Resistance Training   Training Prescription  Yes    Weight  3lbs    Reps  10-15    Time  10 Minutes      Bike   Level  2.5 upright scifit     Minutes  15    METs  4.6      NuStep   Level  3    SPM  80    Minutes  15    METs  2.5       Social History   Tobacco Use  Smoking Status Former Smoker  . Years: 10.00  . Types: Cigarettes  . Last attempt to quit: 06/10/1973  . Years since quitting: 43.9  Smokeless Tobacco Never Used    Goals Met:  Exercise tolerated well Personal goals reviewed No report of cardiac concerns or symptoms  Goals Unmet: N/A   Comments: Pt started cardiac rehab today.  Pt tolerated light exercise without difficulty. VSS, telemetry-Sr with no ectopy, asymptomatic.  Medication list reconciled. Pt denies barriers to medication compliance.  PSYCHOSOCIAL ASSESSMENT:  PHQ-3.  Pt has dealt with having cancer previously but admits this event "rocked her world".  Pt has stressors with family, older husband and the need to feel busy all the time. Pt with  psychosocial needs identified at this time, pt will see Jeanella Craze in [place]  on Friday after exercise. Pt encouraged to attend the education class on Wednesday- meditation and mindfulness.    Pt enjoys working out, going to [employer] and being with freinds. Pt desires to increase strength and stamina and to be able to return to the gym and know his boundaries.  Pt oriented to exercise equipment and routine. Understanding verbalized. Maurice Small RN, BSN Cardiac and Pulmonary Rehab Nurse Navigator      Dr. Fransico Him is Medical Director for Cardiac Rehab at [facility].

## 2017-05-12 NOTE — Patient Instructions (Signed)
Medication Instructions:   Your physician recommends that you continue on your current medications as directed. Please refer to the Current Medication list given to you today.   If you need a refill on your cardiac medications before your next appointment, please call your pharmacy.  Labwork: NONE ORDERED  TODAY    Testing/Procedures: NONE ORDERED  TODAY    Follow-Up: Your physician wants you to follow-up in:  IN  6  MONTHS WITH DR  SMITH You will receive a reminder letter in the mail two months in advance. If you don't receive a letter, please call our office to schedule the follow-up appointment.      Any Other Special Instructions Will Be Listed Below (If Applicable).                                                                                                                                                   

## 2017-05-14 ENCOUNTER — Encounter (HOSPITAL_COMMUNITY)
Admission: RE | Admit: 2017-05-14 | Discharge: 2017-05-14 | Disposition: A | Discharge status: Home / Self Care | Payer: Medicare Other | Source: Ambulatory Visit | Attending: Interventional Cardiology | Admitting: Interventional Cardiology

## 2017-05-14 DIAGNOSIS — I214 Non-ST elevation (NSTEMI) myocardial infarction: Secondary | ICD-10-CM

## 2017-05-14 DIAGNOSIS — I5181 Takotsubo syndrome: Secondary | ICD-10-CM | POA: Diagnosis not present

## 2017-05-15 DIAGNOSIS — M7661 Achilles tendinitis, right leg: Secondary | ICD-10-CM | POA: Diagnosis not present

## 2017-05-16 ENCOUNTER — Encounter (HOSPITAL_COMMUNITY)
Admission: RE | Admit: 2017-05-16 | Discharge: 2017-05-16 | Disposition: A | Discharge status: Home / Self Care | Payer: Medicare Other | Source: Ambulatory Visit | Attending: Interventional Cardiology | Admitting: Interventional Cardiology

## 2017-05-16 DIAGNOSIS — I214 Non-ST elevation (NSTEMI) myocardial infarction: Secondary | ICD-10-CM

## 2017-05-16 DIAGNOSIS — I5181 Takotsubo syndrome: Secondary | ICD-10-CM | POA: Diagnosis not present

## 2017-05-19 ENCOUNTER — Encounter (HOSPITAL_COMMUNITY): Payer: Medicare Other

## 2017-05-21 ENCOUNTER — Encounter (HOSPITAL_COMMUNITY)
Admission: RE | Admit: 2017-05-21 | Discharge: 2017-05-21 | Disposition: A | Discharge status: Home / Self Care | Payer: Medicare Other | Source: Ambulatory Visit | Attending: Interventional Cardiology | Admitting: Interventional Cardiology

## 2017-05-21 VITALS — Wt 132.3 lb

## 2017-05-21 DIAGNOSIS — I214 Non-ST elevation (NSTEMI) myocardial infarction: Secondary | ICD-10-CM | POA: Diagnosis not present

## 2017-05-21 DIAGNOSIS — I5181 Takotsubo syndrome: Secondary | ICD-10-CM | POA: Diagnosis not present

## 2017-05-21 NOTE — Progress Notes (Signed)
[name] 66 y.o. female DOB 08/24/1950 MRN [id number]       Nutrition  1. NSTEMI (non-ST elevated myocardial infarction) ([place])   Note Spoke with pt. Nutrition Plan and Nutrition Survey goals reviewed with pt. Pt is following a Heart Healthy diet.  Pt c/o wanting to eat high fiber foods but not tolerating foods due to IBS. Low FODMAP diet discussed as a way for pt to learn which foods cause GI distress so she does not have to eliminate as many foods from her diet. Pt expressed understanding of the information reviewed. Pt aware of nutrition education classes offered.  Nutrition Diagnosis ? Food-and nutrition-related knowledge deficit related to lack of exposure to information as related to diagnosis of: ? CVD   Nutrition Intervention ? Pt's individual nutrition plan reviewed with pt. ? Benefits of adopting Heart Healthy diet discussed when Medficts reviewed.   ? Handout given for Low FODMAP diet   Nutrition Goal(s):  ? Pt to identify food quantities necessary to achieve pt's desired weight loss.    Plan:  Pt to attend nutrition classes ? Nutrition I ? Nutrition II ? Portion Distortion  Will provide client-centered nutrition education as part of interdisciplinary care.   Monitor and evaluate progress toward nutrition goal with team.  Derek Mound, M.Ed, RD, LDN, CDE 05/21/2017 3:06 PM

## 2017-05-23 ENCOUNTER — Other Ambulatory Visit: Payer: Self-pay | Admitting: Gastroenterology

## 2017-05-23 ENCOUNTER — Encounter (HOSPITAL_COMMUNITY): Payer: Medicare Other

## 2017-05-23 ENCOUNTER — Ambulatory Visit
Admission: RE | Admit: 2017-05-23 | Discharge: 2017-05-23 | Disposition: A | Discharge status: Home / Self Care | Payer: Medicare Other | Source: Ambulatory Visit | Attending: Internal Medicine | Admitting: Internal Medicine

## 2017-05-23 DIAGNOSIS — Z139 Encounter for screening, unspecified: Secondary | ICD-10-CM

## 2017-05-23 DIAGNOSIS — Z1231 Encounter for screening mammogram for malignant neoplasm of breast: Secondary | ICD-10-CM | POA: Diagnosis not present

## 2017-05-26 ENCOUNTER — Encounter (HOSPITAL_COMMUNITY): Payer: Medicare Other

## 2017-05-28 ENCOUNTER — Encounter (HOSPITAL_COMMUNITY): Payer: Medicare Other

## 2017-05-28 ENCOUNTER — Ambulatory Visit: Payer: Medicare Other

## 2017-05-30 ENCOUNTER — Encounter (HOSPITAL_COMMUNITY): Payer: Medicare Other

## 2017-05-30 ENCOUNTER — Telehealth (HOSPITAL_COMMUNITY): Payer: Self-pay | Admitting: Internal Medicine

## 2017-06-04 ENCOUNTER — Encounter (HOSPITAL_COMMUNITY): Payer: Medicare Other

## 2017-06-04 DIAGNOSIS — M7661 Achilles tendinitis, right leg: Secondary | ICD-10-CM | POA: Diagnosis not present

## 2017-06-06 ENCOUNTER — Encounter (HOSPITAL_COMMUNITY): Payer: Medicare Other

## 2017-06-09 ENCOUNTER — Encounter (HOSPITAL_COMMUNITY): Payer: Medicare Other

## 2017-06-11 ENCOUNTER — Encounter (HOSPITAL_COMMUNITY): Payer: Medicare Other

## 2017-06-12 DIAGNOSIS — C678 Malignant neoplasm of overlapping sites of bladder: Secondary | ICD-10-CM | POA: Diagnosis not present

## 2017-06-12 DIAGNOSIS — Z08 Encounter for follow-up examination after completed treatment for malignant neoplasm: Secondary | ICD-10-CM | POA: Diagnosis not present

## 2017-06-12 DIAGNOSIS — Z8551 Personal history of malignant neoplasm of bladder: Secondary | ICD-10-CM | POA: Diagnosis not present

## 2017-06-12 DIAGNOSIS — Z9889 Other specified postprocedural states: Secondary | ICD-10-CM | POA: Diagnosis not present

## 2017-06-13 ENCOUNTER — Encounter (HOSPITAL_COMMUNITY): Payer: Medicare Other

## 2017-06-13 NOTE — Addendum Note (Signed)
Encounter addended by: Dorna Bloom D on: 06/13/2017 4:56 PM  Actions taken: Flowsheet data copied forward, Flowsheet accepted, Visit Navigator Flowsheet section accepted

## 2017-06-16 ENCOUNTER — Encounter (HOSPITAL_COMMUNITY): Payer: Medicare Other

## 2017-06-18 ENCOUNTER — Encounter (HOSPITAL_COMMUNITY): Payer: Medicare Other

## 2017-06-18 NOTE — Addendum Note (Signed)
Encounter addended by: Jewel Baize, RD on: 06/18/2017 11:26 AM  Actions taken: Visit Navigator Flowsheet section accepted

## 2017-06-20 ENCOUNTER — Encounter (HOSPITAL_COMMUNITY): Payer: Medicare Other

## 2017-06-23 ENCOUNTER — Encounter (HOSPITAL_COMMUNITY): Payer: Medicare Other

## 2017-06-25 ENCOUNTER — Encounter (HOSPITAL_COMMUNITY): Payer: Medicare Other

## 2017-06-27 ENCOUNTER — Encounter (HOSPITAL_COMMUNITY): Payer: Medicare Other

## 2017-06-30 ENCOUNTER — Encounter (HOSPITAL_COMMUNITY): Payer: Medicare Other

## 2017-07-01 DIAGNOSIS — F5101 Primary insomnia: Secondary | ICD-10-CM | POA: Diagnosis not present

## 2017-07-01 NOTE — Addendum Note (Signed)
Encounter addended by: Lowell Guitar, RN on: 07/01/2017 11:31 AM  Actions taken: Flowsheet data copied forward, Visit Navigator Flowsheet section accepted, Sign clinical note

## 2017-07-01 NOTE — Addendum Note (Signed)
Encounter addended by: Lowell Guitar, RN on: 07/01/2017 11:33 AM  Actions taken: Episode resolved

## 2017-07-01 NOTE — Progress Notes (Addendum)
Discharge Progress Report  Patient Details  Name: [name] MRN: [id number] Date of Birth: Oct 25, 1950 Referring Provider:     [place] from 05/06/2017 in [place]  Referring Provider  Daneen Schick MD       Number of Visits: 5   Reason for Discharge:  Patient independent in their exercise.  Smoking History:  Social History   Tobacco Use  Smoking Status Former Smoker  . Years: 10.00  . Types: Cigarettes  . Last attempt to quit: 06/10/1973  . Years since quitting: 44.0  Smokeless Tobacco Never Used    Diagnosis:  NSTEMI (non-ST elevated myocardial infarction) ([place])  ADL [facility]:   Initial Exercise Prescription: Initial Exercise Prescription - 05/06/17 1200      Date of Initial Exercise RX and Referring Provider   Date  05/06/17    Referring Provider  Daneen Schick MD      Treadmill   MPH  2.8    Grade  0    Minutes  15    METs  3.14      Bike   Minutes  15      NuStep   Level  3    SPM  80    Minutes  15    METs  2.5      Prescription Details   Frequency (times per week)  3    Duration  Progress to 30 minutes of continuous aerobic without signs/symptoms of physical distress      Intensity   THRR 40-80% of Max Heartrate  62-124    Ratings of Perceived Exertion  11-13    Perceived Dyspnea  0-4      Progression   Progression  Continue to progress workloads to maintain intensity without signs/symptoms of physical distress.      Resistance Training   Training Prescription  Yes    Weight  3lbs    Reps  10-15       Discharge Exercise Prescription (Final Exercise Prescription Changes): Exercise Prescription Changes - 05/21/17 1648      Response to Exercise   Blood Pressure (Admit)  120/70    Blood Pressure (Exercise)  126/60    Blood Pressure (Exit)  122/70    Heart Rate (Admit)  66 bpm    Heart Rate (Exercise)  97 bpm    Heart Rate (Exit)  75 bpm    Rating of Perceived Exertion  (Exercise)  11    Symptoms  none    Duration  Continue with 30 min of aerobic exercise without signs/symptoms of physical distress.    Intensity  THRR unchanged      Progression   Progression  Continue to progress workloads to maintain intensity without signs/symptoms of physical distress.    Average METs  3.3      Resistance Training   Training Prescription  No relaxation      Treadmill   MPH  2.8    Grade  0    Minutes  15    METs  3.14      Track   Laps  20    Minutes  15    METs  3.2       Functional Capacity: 6 Minute Walk    Row Name 05/06/17 1226         6 Minute Walk   Phase  Initial     Distance  1664 feet     Walk Time  6  minutes     # of Rest Breaks  0     MPH  3.15     METS  3.72     RPE  12     VO2 Peak  13.01     Symptoms  Yes (comment)     Comments  anginal discomfort 1-2 out of 10. Pt baseline is 1-2 out of 10 at sitting and walking on a flat surface     Resting HR  56 bpm     Resting BP  112/60     Resting Oxygen Saturation   99 %     Exercise Oxygen Saturation  during 6 min walk  98 %     Max Ex. HR  83 bpm     Max Ex. BP  120/70     2 Minute Post BP  114/76        Psychological, QOL, Others - Outcomes: PHQ 2/9: Depression screen PHQ 2/9 05/12/2017  Decreased Interest 1  Down, Depressed, Hopeless 1  PHQ - 2 Score 2  Altered sleeping 0  Tired, decreased energy 1  Change in appetite 0  Feeling bad or failure about yourself  0  Trouble concentrating 0  Moving slowly or fidgety/restless 0  Suicidal thoughts 0  PHQ-9 Score 3  Difficult doing work/chores Somewhat difficult    Quality of Life: Quality of Life - 05/06/17 1000      Quality of Life Scores   Health/Function Pre  17.47 %    Socioeconomic Pre  26.5 %    Psych/Spiritual Pre  22.43 %    Family Pre  15.6 %    GLOBAL Pre  20.26 %       Personal Goals: Goals established at orientation with interventions provided to work toward goal. Personal Goals and Risk Factors at  Admission - 05/06/17 1232      Core Components/Risk Factors/Patient Goals on Admission   Hypertension  Yes    Intervention  Provide education on lifestyle modifcations including regular physical activity/exercise, weight management, moderate sodium restriction and increased consumption of fresh fruit, vegetables, and low fat dairy, alcohol moderation, and smoking cessation.;Monitor prescription use compliance.    Expected Outcomes  Short Term: Continued assessment and intervention until BP is < 140/2mm HG in hypertensive participants. < 130/22mm HG in hypertensive participants with diabetes, heart failure or chronic kidney disease.;Long Term: Maintenance of blood pressure at goal levels.    Lipids  Yes    Intervention  Provide education and support for participant on nutrition & aerobic/resistive exercise along with prescribed medications to achieve LDL 70mg , HDL >40mg .    Expected Outcomes  Short Term: Participant states understanding of desired cholesterol values and is compliant with medications prescribed. Participant is following exercise prescription and nutrition guidelines.;Long Term: Cholesterol controlled with medications as prescribed, with individualized exercise RX and with personalized nutrition plan. Value goals: LDL < 70mg , HDL > 40 mg.    Stress  Yes    Intervention  Offer individual and/or small group education and counseling on adjustment to heart disease, stress management and health-related lifestyle change. Teach and support self-help strategies.;Refer participants experiencing significant psychosocial distress to appropriate mental health specialists for further evaluation and treatment. When possible, include family members and significant others in education/counseling sessions.    Expected Outcomes  Short Term: Participant demonstrates changes in health-related behavior, relaxation and other stress management skills, ability to obtain effective social support, and compliance  with psychotropic medications if prescribed.;Long Term: Emotional wellbeing is  indicated by absence of clinically significant psychosocial distress or social isolation.        Personal Goals Discharge: Goals and Risk Factor Review    Row Name 05/09/17 1713 07/01/17 1129           Core Components/Risk Factors/Patient Goals Review   Personal Goals Review  Hypertension;Lipids;Stress  -      Review  pt with known CAD and multiple RF demonstrates eagerness to participate in CR activities.   pt demonstrates compliance with hypertension and hyperlipidemia self management. pt plans to exercise on her own.       Expected Outcomes  pt will participate in CR exercise, nutrition and lifestyle modification opportunities.   pt will participate in exercise, nutrition and lifestyle modification opportunities in the community.          Exercise Goals and Review: Exercise Goals    Row Name 05/06/17 1231             Exercise Goals   Increase Physical Activity  Yes       Intervention  Provide advice, education, support and counseling about physical activity/exercise needs.;Develop an individualized exercise prescription for aerobic and resistive training based on initial evaluation findings, risk stratification, comorbidities and participant's personal goals.       Expected Outcomes  Achievement of increased cardiorespiratory fitness and enhanced flexibility, muscular endurance and strength shown through measurements of functional capacity and personal statement of participant.       Increase Strength and Stamina  Yes return to gym-like routine       Intervention  Provide advice, education, support and counseling about physical activity/exercise needs.;Develop an individualized exercise prescription for aerobic and resistive training based on initial evaluation findings, risk stratification, comorbidities and participant's personal goals.       Expected Outcomes  Achievement of increased  cardiorespiratory fitness and enhanced flexibility, muscular endurance and strength shown through measurements of functional capacity and personal statement of participant.       Able to understand and use rate of perceived exertion (RPE) scale  Yes       Intervention  Provide education and explanation on how to use RPE scale       Expected Outcomes  Short Term: Able to use RPE daily in rehab to express subjective intensity level;Long Term:  Able to use RPE to guide intensity level when exercising independently       Knowledge and understanding of Target Heart Rate Range (THRR)  Yes       Intervention  Provide education and explanation of THRR including how the numbers were predicted and where they are located for reference       Expected Outcomes  Short Term: Able to state/look up THRR;Long Term: Able to use THRR to govern intensity when exercising independently;Short Term: Able to use daily as guideline for intensity in rehab       Able to check pulse independently  Yes       Intervention  Provide education and demonstration on how to check pulse in carotid and radial arteries.;Review the importance of being able to check your own pulse for safety during independent exercise       Expected Outcomes  Short Term: Able to explain why pulse checking is important during independent exercise;Long Term: Able to check pulse independently and accurately       Understanding of Exercise Prescription  Yes       Intervention  Provide education, explanation, and written materials on patient's individual exercise  prescription       Expected Outcomes  Short Term: Able to explain program exercise prescription;Long Term: Able to explain home exercise prescription to exercise independently          Nutrition & Weight - Outcomes: Pre Biometrics - 05/06/17 1231      Pre Biometrics   Height  5' 3.25" (1.607 m)    Weight  132 lb 11.5 oz (60.2 kg)    Waist Circumference  30.5 inches    Hip Circumference  38 inches     Waist to Hip Ratio  0.8 %    BMI (Calculated)  23.31    Triceps Skinfold  25 mm    % Body Fat  34.4 %    Grip Strength  22 kg    Flexibility  13 in    Single Leg Stand  10 seconds      Post Biometrics - 06/13/17 1652       Post  Biometrics   Weight  132 lb 4.4 oz (60 kg)       Nutrition: Nutrition Therapy & Goals - 05/12/17 1115      Nutrition Therapy   Diet  Heart Healthy      Personal Nutrition Goals   Nutrition Goal  Pt to identify food quantities necessary to achieve pt's desired weight loss.        Intervention Plan   Intervention  Prescribe, educate and counsel regarding individualized specific dietary modifications aiming towards targeted core components such as weight, hypertension, lipid management, diabetes, heart failure and other comorbidities.    Expected Outcomes  Short Term Goal: Understand basic principles of dietary content, such as calories, fat, sodium, cholesterol and nutrients.;Long Term Goal: Adherence to prescribed nutrition plan.       Nutrition Discharge: Nutrition Assessments - 06/18/17 1125      MEDFICTS Scores   Pre Score  6       Education Questionnaire Score: Knowledge Questionnaire Score - 05/06/17 0955      Knowledge Questionnaire Score   Pre Score  23/24       Goals reviewed with patient; copy given to patient.

## 2017-07-01 NOTE — Addendum Note (Signed)
Encounter addended by: Lowell Guitar, RN on: 07/01/2017 11:34 AM  Actions taken: Sign clinical note

## 2017-07-02 ENCOUNTER — Encounter (HOSPITAL_COMMUNITY): Payer: Medicare Other

## 2017-07-04 ENCOUNTER — Encounter (HOSPITAL_COMMUNITY): Payer: Medicare Other

## 2017-07-07 ENCOUNTER — Encounter (HOSPITAL_COMMUNITY): Payer: Medicare Other

## 2017-07-09 ENCOUNTER — Encounter (HOSPITAL_COMMUNITY): Payer: Medicare Other

## 2017-07-11 ENCOUNTER — Encounter (HOSPITAL_COMMUNITY): Payer: Medicare Other

## 2017-07-14 ENCOUNTER — Encounter (HOSPITAL_COMMUNITY): Payer: Medicare Other

## 2017-07-16 ENCOUNTER — Encounter (HOSPITAL_COMMUNITY): Payer: Medicare Other

## 2017-07-18 ENCOUNTER — Encounter (HOSPITAL_COMMUNITY): Payer: Medicare Other

## 2017-07-21 ENCOUNTER — Other Ambulatory Visit: Payer: Self-pay | Admitting: Interventional Cardiology

## 2017-07-21 ENCOUNTER — Encounter (HOSPITAL_COMMUNITY): Payer: Medicare Other

## 2017-07-21 MED ORDER — RANOLAZINE ER 500 MG PO TB12: 500.0000 mg | ORAL_TABLET | Freq: Two times a day (BID) | ORAL | 3 refills | Status: DC

## 2017-07-23 ENCOUNTER — Encounter (HOSPITAL_COMMUNITY): Payer: Medicare Other

## 2017-07-25 ENCOUNTER — Encounter (HOSPITAL_COMMUNITY): Payer: Medicare Other

## 2017-07-28 ENCOUNTER — Encounter (HOSPITAL_COMMUNITY): Payer: Medicare Other

## 2017-07-30 ENCOUNTER — Encounter (HOSPITAL_COMMUNITY): Payer: Medicare Other

## 2017-08-01 ENCOUNTER — Encounter (HOSPITAL_COMMUNITY): Payer: Medicare Other

## 2017-08-13 DIAGNOSIS — N390 Urinary tract infection, site not specified: Secondary | ICD-10-CM | POA: Diagnosis not present

## 2017-08-13 DIAGNOSIS — Z713 Dietary counseling and surveillance: Secondary | ICD-10-CM | POA: Diagnosis not present

## 2017-08-21 ENCOUNTER — Other Ambulatory Visit: Payer: Self-pay | Admitting: Gastroenterology

## 2017-08-29 ENCOUNTER — Other Ambulatory Visit: Payer: Self-pay | Admitting: Gastroenterology

## 2017-09-03 DIAGNOSIS — D485 Neoplasm of uncertain behavior of skin: Secondary | ICD-10-CM | POA: Diagnosis not present

## 2017-09-03 DIAGNOSIS — Z85828 Personal history of other malignant neoplasm of skin: Secondary | ICD-10-CM | POA: Diagnosis not present

## 2017-09-03 DIAGNOSIS — D2372 Other benign neoplasm of skin of left lower limb, including hip: Secondary | ICD-10-CM | POA: Diagnosis not present

## 2017-09-03 DIAGNOSIS — D225 Melanocytic nevi of trunk: Secondary | ICD-10-CM | POA: Diagnosis not present

## 2017-09-03 DIAGNOSIS — Z23 Encounter for immunization: Secondary | ICD-10-CM | POA: Diagnosis not present

## 2017-09-03 DIAGNOSIS — L821 Other seborrheic keratosis: Secondary | ICD-10-CM | POA: Diagnosis not present

## 2017-09-03 DIAGNOSIS — M25551 Pain in right hip: Secondary | ICD-10-CM | POA: Diagnosis not present

## 2017-09-03 DIAGNOSIS — M7061 Trochanteric bursitis, right hip: Secondary | ICD-10-CM | POA: Diagnosis not present

## 2017-09-03 DIAGNOSIS — D2262 Melanocytic nevi of left upper limb, including shoulder: Secondary | ICD-10-CM | POA: Diagnosis not present

## 2017-09-03 DIAGNOSIS — Z86018 Personal history of other benign neoplasm: Secondary | ICD-10-CM | POA: Diagnosis not present

## 2017-09-03 DIAGNOSIS — B078 Other viral warts: Secondary | ICD-10-CM | POA: Diagnosis not present

## 2017-09-08 DIAGNOSIS — M65331 Trigger finger, right middle finger: Secondary | ICD-10-CM | POA: Diagnosis not present

## 2017-09-11 ENCOUNTER — Telehealth: Payer: Self-pay | Admitting: Interventional Cardiology

## 2017-09-11 NOTE — Telephone Encounter (Signed)
Spoke with pt and when she picked up her new prescription of Ranexa she states [facility] told her it was generic.  Pt wanted to make sure this was ok.  Advised this would be fine.  Pt appreciative for call.

## 2017-09-11 NOTE — Telephone Encounter (Signed)
New message    Pt c/o medication issue:  1. Name of Medication: ranolazine (RANEXA) 500 MG 12 hr tablet  2. How are you currently taking this medication (dosage and times per day)? Take 1 tablet (500 mg total) by mouth 2 (two) times daily  3. Are you having a reaction (difficulty breathing--STAT)? No  4. What is your medication issue? Patient wants to discuss brand name versus generic

## 2017-09-18 DIAGNOSIS — C674 Malignant neoplasm of posterior wall of bladder: Secondary | ICD-10-CM | POA: Diagnosis not present

## 2017-09-24 ENCOUNTER — Ambulatory Visit (INDEPENDENT_AMBULATORY_CARE_PROVIDER_SITE_OTHER): Payer: Medicare Other | Admitting: Gastroenterology

## 2017-09-24 ENCOUNTER — Encounter: Payer: Self-pay | Admitting: Gastroenterology

## 2017-09-24 VITALS — BP 134/86 | HR 64 | Ht 62.5 in | Wt 130.8 lb

## 2017-09-24 DIAGNOSIS — K594 Anal spasm: Secondary | ICD-10-CM | POA: Diagnosis not present

## 2017-09-24 DIAGNOSIS — K589 Irritable bowel syndrome without diarrhea: Secondary | ICD-10-CM

## 2017-09-24 DIAGNOSIS — J0100 Acute maxillary sinusitis, unspecified: Secondary | ICD-10-CM | POA: Diagnosis not present

## 2017-09-24 MED ORDER — DICYCLOMINE HCL 10 MG PO CAPS: 10.0000 mg | ORAL_CAPSULE | Freq: Two times a day (BID) | ORAL | 2 refills | Status: DC

## 2017-09-24 MED ORDER — ESOMEPRAZOLE MAGNESIUM 40 MG PO CPDR: 40.0000 mg | DELAYED_RELEASE_CAPSULE | Freq: Every day | ORAL | 3 refills | Status: DC

## 2017-09-24 MED ORDER — HYDROCORTISONE ACETATE 25 MG RE SUPP: 25.0000 mg | Freq: Every day | RECTAL | 0 refills | Status: DC

## 2017-09-24 NOTE — Patient Instructions (Addendum)
If you are age 66 or older, your body mass index should be between 23-30. Your Body mass index is 23.54 kg/m. If this is out of the aforementioned range listed, please consider follow up with your Primary Care Provider.  If you are age 38 or younger, your body mass index should be between 19-25. Your Body mass index is 23.54 kg/m. If this is out of the aformentioned range listed, please consider follow up with your Primary Care Provider.   We have sent the following medications to your pharmacy for you to pick up at your convenience: Bentyl Hydrocortisone suppository  You have been given samples of IBgard - take as needed.  Follow up with Dr. Silverio Decamp in eight weeks.  Please call the office when the schedule is available in a few weeks.    Thank you for choosing me and [facility].   Alonza Bogus, PA-C

## 2017-09-24 NOTE — Progress Notes (Signed)
09/24/2017 [name] [id number] 04-12-1951   HISTORY OF PRESENT ILLNESS:  This is a 67 year old female previously followed by  Dr. Nichola Sizer with extensive GI history.  Now following with Dr. Silverio Decamp.  She has long-standing reflux and esophageal spasms. She is currently on Nexium 40 mg daily. She uses sublingual Ativan as needed for her severe esophageal spasms, but says that she has not needed it in a while and she found that drinking water helps the esophageal spasms when she gets them. She also has rectal spasms for which she uses diltiazem gel usually, but that is what she is here to discuss today.  When she was seen here last time she had the same complaint and was scheduled for flex sig.  That was performed on 01/2016 and showed only non-bleeding internal hemorrhoids.  Says that sometimes the spasms will last for 30 minutes and are very painful.  Has IBS as well.  Used to take an antispasmodic but no longer has those on her list and she says that she had not used them in a while.  Also complains of a lot of generalized bloating.   Past Medical History:  Diagnosis Date  . Anxiety    Psychiatry Dr. Stan Head, concerns for memory loss, anxiety  . Bladder tumor   . Broken heart syndrome   . 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 Chronic chest pain   . Coronary atherosclerosis CARDIOLOGIST-  DR Daneen Schick   Cath 11/2006 demonstrating moderate obstructive coronary disease with 70-80% septal perforator #1, 50-70% first diagonal, 80% third diagonal, 50% mid and distal LAD & heavy calcification throughout all 3 coronaries. LVF normal.  . Esophageal motility disorder   . Essential hypertension, benign   . Fatty liver 04/28/12  . GERD (gastroesophageal reflux disease)   . H/O esophageal spasm   . Heart murmur   . History of esophageal spasm    takes ranexa  . History of TIA (transient ischemic attack)    2011--  no residuals  . Hyperlipidemia   . IBS (irritable bowel syndrome)    Diarrhea predominent, esophageal spasm severe, GERD - Dr. Elicia Lamp  . Interstitial cystitis    Dr. Gaynelle Arabian  . Memory loss    Psychiatry Dr. Stan Head, concerns for memory loss, anxiety  . PONV (postoperative nausea and vomiting)    AND URINARY RETENTION  . Sensation of pressure in bladder area   . TMJ (temporomandibular joint syndrome)   . Wears glasses    Past Surgical History:  Procedure Laterality Date  . ABDOMINAL HYSTERECTOMY  AGE 55 -- 1989  . APPENDECTOMY  1971   AND OVARIAN CYSTECTOMY  . BENIGN EXCISIONAL BREAST BX  1996  . BREAST EXCISIONAL BIOPSY Right 1992  . CARDIAC CATHETERIZATION  11-28-2006   DR Daneen Schick    moderate cad/  70-80% septal perforator #1, 50-70% first diagonal, 80% third diagonal, 50% mid and distal LAD & heavy calcification throughout all 3 coronaries. LVF normal.  . CARDIAC CATHETERIZATION  03-26-2012   DR Daneen Schick   patent CFX,  RCA,  & pLAD/  mLAD >50% to <70% a focal eccentric region that overlaps the third diagonal/  90% diagonal ostial and unchanged from prior study /   ef 65%  . CYSTO WITH HYDRODISTENSION N/A 01/14/2014   Procedure: CYSTOSCOPY/HYDRODISTENSION/INSERTION OF MARCAINE AND PYRIDUIM INTO BLADDER/INJECTION OF MARCAINE AND KENALOG into sub trigone space ;  Surgeon: Ailene Rud, MD;  Location: [facility];  Service: Urology;  Laterality: N/A;  . CYSTO/  HYDRODISTENTION/  INSTILLATION THERAPY  03-12-2010  . ESOPHAGOGASTRODUODENOSCOPY  04/30/2012   Procedure: ESOPHAGOGASTRODUODENOSCOPY (EGD);  Surgeon: Lafayette Dragon, MD;  Location: [facility];  Service: Endoscopy;  Laterality: N/A;  . LEFT HEART CATH AND CORONARY ANGIOGRAPHY N/A 02/18/2017   Procedure: LEFT HEART CATH AND CORONARY ANGIOGRAPHY;  Surgeon: Belva Crome, MD;  Location: [facility];  Service: Cardiovascular;  Laterality: N/A;  . LUMBAR [place]  . NEGATIVE SLEEP STUDY  2010  . SHOULDER ARTHROSCOPY Left 12-23-2011  .  TRANSTHORACIC ECHOCARDIOGRAM  04-21-2012   GRADE I DIASTOLIC DYSFUNCTION/  EF 60-65%/  MILD MR  &  TR  . TRANSURETHRAL RESECTION OF BLADDER TUMOR Right 01/03/2015   Procedure: TRANSURETHRAL RESECTION OF BLADDER TUMOR (TURBT);  Surgeon: Carolan Clines, MD;  Location: [facility];  Service: Urology;  Laterality: Right;    reports that she quit smoking about 44 years ago. Her smoking use included cigarettes. She quit after 10.00 years of use. She has never used smokeless tobacco. She reports that she drinks about 1.2 oz of alcohol per week. She reports that she does not use drugs. family history includes Alzheimer's disease in her mother; Breast cancer (age of onset: 72) in her sister; CAD in her father and mother; Colon polyps in her mother; Dementia in her mother; Depression in her other; Diverticulosis in her father; Heart disease in her brother, father, and mother; Obesity in her other; Osteoarthritis in her other. Allergies  Allergen Reactions  . Clindamycin/Lincomycin Other (See Comments)    Pt not sure of reaction   . Doxycycline Nausea Only and Nausea And Vomiting  . Escitalopram Oxalate Nausea Only  . Hyoscyamine Itching  . Macrodantin Other (See Comments)    Flushed, hot  . Trazodone Nausea Only  . Trazodone And Nefazodone Nausea Only  . Trimethoprim Other (See Comments)    unknown   . Levsin [Hyoscyamine Sulfate] Hives, Swelling, Anxiety and Photosensitivity    Vision issues.  . Lincomycin Rash    Reaction unknown  . Nitrofurantoin Rash    Flushed, hot  . Nitrofurantoin Macrocrystal Anxiety    Flushing, hot face and ears      Outpatient Encounter Medications as of 09/24/2017  Medication Sig  . acyclovir (ZOVIRAX) 400 MG tablet Take 400 mg by mouth 2 (two) times daily.  . AMBULATORY NON FORMULARY MEDICATION as needed. Medication Name: enzyme as needed  . aspirin 81 MG tablet Take 81 mg by mouth at bedtime.   Marland Kitchen atorvastatin (LIPITOR) 80 MG tablet Take 40 mg  by mouth daily.  . Cholecalciferol (VITAMIN D) 1000 UNITS capsule Take 1,000 Units by mouth daily.  Marland Kitchen diltiazem 2 % GEL Apply 1 application topically as needed. For rectal spasm  . docusate sodium (COLACE) 100 MG capsule Take 200 mg by mouth as needed for mild constipation.  Marland Kitchen esomeprazole (NEXIUM) 40 MG packet Take 40 mg by mouth daily before breakfast.  . estradiol (CLIMARA - DOSED IN MG/24 HR) 0.025 mg/24hr patch Place 0.025 mg onto the skin once a week.  Marland Kitchen ibuprofen (ADVIL,MOTRIN) 200 MG tablet Take 600 mg by mouth every 6 (six) hours as needed for headache or moderate pain.  Marland Kitchen LORazepam (ATIVAN) 0.5 MG tablet Take 0.5 mg by mouth 3 (three) times daily as needed (Espohegeal spasms).  . nebivolol (BYSTOLIC) 2.5 MG tablet Take 1 tablet (2.5 mg total) by mouth daily.  Marland Kitchen NEXIUM 40  MG capsule take 1 capsule by mouth once daily  . nitroGLYCERIN (NITROSTAT) 0.4 MG SL tablet place 1 tablet under the tongue if needed every 5 minutes for chest pain for 3 doses IF NO RELIEF AFTER 3RD DOSE CALL PRESCRIBER OR 911.  Marland Kitchen oxyCODONE (OXY IR/ROXICODONE) 5 MG immediate release tablet Take 5 mg by mouth as needed for moderate pain.   . Probiotic Product (PROBIOTIC ACIDOPHILUS) CAPS Take 1 capsule by mouth daily.  . QUEtiapine (SEROQUEL) 25 MG tablet Take 25 mg by mouth at bedtime. For sleep  (pt takes seroquel OR temazepam for sleep)  . ranolazine (RANEXA) 500 MG 12 hr tablet Take 1 tablet (500 mg total) by mouth 2 (two) times daily.  . temazepam (RESTORIL) 15 MG capsule Take 15 mg by mouth See admin instructions. Approximately twice weekly for sleep instead of seroquel  . [DISCONTINUED] dicyclomine (BENTYL) 10 MG capsule Take 10 mg by mouth 3 (three) times daily as needed for spasms (IBS).  . [DISCONTINUED] loperamide (IMODIUM A-D) 2 MG tablet Take 2 mg by mouth as needed for diarrhea or loose stools.   No facility-administered encounter medications on file as of 09/24/2017.      REVIEW OF SYSTEMS  : All other  systems reviewed and negative except where noted in the History of Present Illness.   PHYSICAL EXAM: BP 134/86   Pulse 64   Ht 5' 2.5" (1.588 m)   Wt 130 lb 12.8 oz (59.3 kg)   BMI 23.54 kg/m  General: Well developed white female in no acute distress Head: Normocephalic and atraumatic Eyes:  Sclerae anicteric, conjunctiva pink. Ears: Normal auditory acuity Lungs: Clear throughout to auscultation; no increased WOB. Heart: Regular rate and rhythm; no M/R/G. Abdomen: Soft, non-distended.  BS present.  Mild diffuse TTP. Musculoskeletal: Symmetrical with no gross deformities  Skin: No lesions on visible extremities Extremities: No edema  Neurological: Alert oriented x 4, grossly non-focal Psychological:  Alert and cooperative. Normal mood and affect  ASSESSMENT AND PLAN: -Rectal spasm/IBS:  Usually manages these with diltiazem gel.  Flex sig with only internal hemorrhoids in 01/2016.  Will treat the internal hemorrhoids with hydrocortisone suppositories.  Will restart some Bentyl 10 mg twice daily to see if that helps with her rectal spasms and her intestinal spasms/IBS as well.  Will try IBgard for her bloating, samples given.  Follow-up with Dr. Silverio Decamp in 8 weeks or so if ongoing symptoms.  **25 minutes spent with the patient in which at least 50% was spent in counseling and discussion of treatment options.   CC:  Leeroy Cha

## 2017-10-06 ENCOUNTER — Other Ambulatory Visit: Payer: Self-pay | Admitting: Cardiology

## 2017-10-07 NOTE — Telephone Encounter (Signed)
Ok to fill 

## 2017-10-07 NOTE — Telephone Encounter (Signed)
Pt is requesting  a refill for bystolic 2.5 mg. Please address Thank You.

## 2017-10-08 ENCOUNTER — Encounter: Payer: Self-pay | Admitting: Gastroenterology

## 2017-10-10 NOTE — Progress Notes (Signed)
Reviewed and agree with documentation and assessment and plan. K. Veena Nandigam , MD   

## 2017-10-29 DIAGNOSIS — F5101 Primary insomnia: Secondary | ICD-10-CM | POA: Diagnosis not present

## 2017-11-25 ENCOUNTER — Encounter: Payer: Self-pay | Admitting: Gastroenterology

## 2017-11-25 ENCOUNTER — Ambulatory Visit (INDEPENDENT_AMBULATORY_CARE_PROVIDER_SITE_OTHER): Payer: Medicare Other | Admitting: Gastroenterology

## 2017-11-25 VITALS — BP 110/60 | HR 64 | Ht 62.25 in | Wt 132.5 lb

## 2017-11-25 DIAGNOSIS — N8110 Cystocele, unspecified: Secondary | ICD-10-CM | POA: Diagnosis not present

## 2017-11-25 DIAGNOSIS — K588 Other irritable bowel syndrome: Secondary | ICD-10-CM

## 2017-11-25 DIAGNOSIS — R109 Unspecified abdominal pain: Secondary | ICD-10-CM | POA: Diagnosis not present

## 2017-11-25 DIAGNOSIS — K594 Anal spasm: Secondary | ICD-10-CM | POA: Diagnosis not present

## 2017-11-25 NOTE — Patient Instructions (Addendum)
Kegel Exercises Kegel exercises help strengthen the muscles that support the rectum, vagina, small intestine, bladder, and uterus. Doing Kegel exercises can help:  Improve bladder and bowel control.  Improve sexual response.  Reduce problems and discomfort during pregnancy.  Kegel exercises involve squeezing your pelvic floor muscles, which are the same muscles you squeeze when you try to stop the flow of urine. The exercises can be done while sitting, standing, or lying down, but it is best to vary your position. Phase 1 exercises 1. Squeeze your pelvic floor muscles tight. You should feel a tight lift in your rectal area. If you are a female, you should also feel a tightness in your vaginal area. Keep your stomach, buttocks, and legs relaxed. 2. Hold the muscles tight for up to 10 seconds. 3. Relax your muscles. Repeat this exercise 50 times a day or as many times as told by your health care provider. Continue to do this exercise for at least 4-6 weeks or for as long as told by your health care provider. This information is not intended to replace advice given to you by your health care provider. Make sure you discuss any questions you have with your health care provider. Document Released: 05/13/2012 Document Revised: 01/20/2016 Document Reviewed:    04/16/2015 Elsevier Interactive Patient Education  [employer].    We will refer you to Dr Genia Harold at [facility] , and they will contact you with that appointment

## 2017-11-25 NOTE — Progress Notes (Signed)
[name]    [id number]    [birth year]  Primary Care Physician:Varadarajan, [name], MD  Referring Physician: Leeroy Cha, MD [address] [place], [place] [address]  Chief complaint: Irritable bowel syndrome  HPI: 67 year old female with chronic GERD and irritable bowel syndrome here for follow-up visit.  She has alternating constipation and diarrhea, usually has a BM 1-2/day then skips for a day or two.  She has abdominal bloating and cramping.  She has history of his facial and rectal spasm.  Previously on Nexium 40 mg daily, discontinued due to potential side effects with long-term use.  Patient complains of bulging through her vagina after she did weights at the gym yesterday.  She is worried about bladder prolapse.  She is status post hysterectomy.  Denies any rectal or vaginal bleeding.  She has had done pelvic floor physical therapy in the past. Denies any nausea, vomiting, abdominal pain, melena or bright red blood per rectum Flexible sigmoidoscopy January 22, 2016 for persistent anorectal and pelvic pain showed internal hemorrhoids otherwise normal Colonoscopy April 07, 2013 showed internal hemorrhoids otherwise normal exam  Outpatient Encounter Medications as of 11/25/2017  Medication Sig  . acyclovir (ZOVIRAX) 400 MG tablet Take 400 mg by mouth 2 (two) times daily.  . AMBULATORY NON FORMULARY MEDICATION as needed. Medication Name: enzyme as needed  . aspirin 81 MG tablet Take 81 mg by mouth at bedtime.   Marland Kitchen atorvastatin (LIPITOR) 80 MG tablet Take 40 mg by mouth daily.  Marland Kitchen BYSTOLIC 2.5 MG tablet TAKE 1 TABLET BY MOUTH EVERY DAY  . Cholecalciferol (VITAMIN D) 1000 UNITS capsule Take 1,000 Units by mouth daily.  Marland Kitchen dicyclomine (BENTYL) 10 MG capsule Take 1 capsule (10 mg total) by mouth 2 (two) times daily.  . Digestive Enzymes (MULTI-ENZYME) TABS Take 1 tablet by mouth as needed.  . diltiazem 2 % GEL Apply 1 application topically as  needed. For rectal spasm  . docusate sodium (COLACE) 100 MG capsule Take 200 mg by mouth as needed for mild constipation.  Marland Kitchen esomeprazole (NEXIUM) 40 MG capsule Take 1 capsule (40 mg total) by mouth daily.  Marland Kitchen estradiol (CLIMARA - DOSED IN MG/24 HR) 0.025 mg/24hr patch Place 0.025 mg onto the skin once a week.  . hydrocortisone (ANUSOL-HC) 25 MG suppository Place 1 suppository (25 mg total) rectally at bedtime. Use for 10 days.  Marland Kitchen ibuprofen (ADVIL,MOTRIN) 200 MG tablet Take 600 mg by mouth every 6 (six) hours as needed for headache or moderate pain.  Marland Kitchen LORazepam (ATIVAN) 0.5 MG tablet Take 0.5 mg by mouth 3 (three) times daily as needed (Espohegeal spasms).  . nitroGLYCERIN (NITROSTAT) 0.4 MG SL tablet place 1 tablet under the tongue if needed every 5 minutes for chest pain for 3 doses IF NO RELIEF AFTER 3RD DOSE CALL PRESCRIBER OR 911.  Marland Kitchen oxyCODONE (OXY IR/ROXICODONE) 5 MG immediate release tablet Take 5 mg by mouth as needed for moderate pain.   Marland Kitchen Peppermint Oil (IBGARD) 90 MG CPCR Take 2 capsules by mouth as needed.  . Probiotic Product (PROBIOTIC ACIDOPHILUS) CAPS Take 1 capsule by mouth daily.  . QUEtiapine (SEROQUEL) 25 MG tablet Take 25 mg by mouth at bedtime. For sleep  (pt takes seroquel OR temazepam for sleep)  . ranolazine (RANEXA) 500 MG 12 hr tablet Take 1 tablet (500 mg total) by mouth 2 (two) times daily.  . temazepam (RESTORIL) 15 MG capsule Take 15 mg by  mouth See admin instructions. Approximately twice weekly for sleep instead of seroquel  . [DISCONTINUED] esomeprazole (NEXIUM) 40 MG packet Take 40 mg by mouth daily before breakfast.   No facility-administered encounter medications on file as of 11/25/2017.     Allergies as of 11/25/2017 - Review Complete 11/25/2017  Allergen Reaction Noted  . Clindamycin/lincomycin Other (See Comments) 10/05/2011  . Doxycycline Nausea Only and Nausea And Vomiting 10/05/2011  . Escitalopram oxalate Nausea Only 10/05/2011  . Hyoscyamine Itching    . Macrodantin Other (See Comments) 10/05/2011  . Trazodone Nausea Only 10/05/2011  . Trazodone and nefazodone Nausea Only 10/05/2011  . Trimethoprim Other (See Comments) 12/09/2013  . Levsin [hyoscyamine sulfate] Hives, Swelling, Anxiety, and Photosensitivity 04/29/2012  . Lincomycin Rash 10/05/2011  . Nitrofurantoin Rash 10/05/2011  . Nitrofurantoin macrocrystal Anxiety     Past Medical History:  Diagnosis Date  . Anxiety    Psychiatry Dr. Stan Head, concerns for memory loss, anxiety  . Bladder tumor   . Broken heart syndrome   . Cancer ([place])    bladder CA  . Chronic chest pain   . Coronary atherosclerosis CARDIOLOGIST-  DR Daneen Schick   Cath 11/2006 demonstrating moderate obstructive coronary disease with 70-80% septal perforator #1, 50-70% first diagonal, 80% third diagonal, 50% mid and distal LAD & heavy calcification throughout all 3 coronaries. LVF normal.  . Esophageal motility disorder   . Essential hypertension, benign   . Fatty liver 04/28/12  . GERD (gastroesophageal reflux disease)   . H/O esophageal spasm   . Heart murmur   . History of esophageal spasm    takes ranexa  . History of TIA (transient ischemic attack)    2011--  no residuals  . Hyperlipidemia   . IBS (irritable bowel syndrome)    Diarrhea predominent, esophageal spasm severe, GERD - Dr. Elicia Lamp  . Interstitial cystitis    Dr. Gaynelle Arabian  . Memory loss    Psychiatry Dr. Stan Head, concerns for memory loss, anxiety  . PONV (postoperative nausea and vomiting)    AND URINARY RETENTION  . Sensation of pressure in bladder area   . TMJ (temporomandibular joint syndrome)   . Wears glasses     Past Surgical History:  Procedure Laterality Date  . ABDOMINAL HYSTERECTOMY  AGE 30 -- 1989  . APPENDECTOMY  1971   AND OVARIAN CYSTECTOMY  . BENIGN EXCISIONAL BREAST BX  1996  . BREAST EXCISIONAL BIOPSY Right 1992  . CARDIAC CATHETERIZATION  11-28-2006   DR Daneen Schick    moderate cad/  70-80%  septal perforator #1, 50-70% first diagonal, 80% third diagonal, 50% mid and distal LAD & heavy calcification throughout all 3 coronaries. LVF normal.  . CARDIAC CATHETERIZATION  03-26-2012   DR Daneen Schick   patent CFX,  RCA,  & pLAD/  mLAD >50% to <70% a focal eccentric region that overlaps the third diagonal/  90% diagonal ostial and unchanged from prior study /   ef 65%  . CYSTO WITH HYDRODISTENSION N/A 01/14/2014   Procedure: CYSTOSCOPY/HYDRODISTENSION/INSERTION OF MARCAINE AND PYRIDUIM INTO BLADDER/INJECTION OF MARCAINE AND KENALOG into sub trigone space ;  Surgeon: Ailene Rud, MD;  Location: [facility];  Service: Urology;  Laterality: N/A;  . CYSTO/  HYDRODISTENTION/  INSTILLATION THERAPY  03-12-2010  . ESOPHAGOGASTRODUODENOSCOPY  04/30/2012   Procedure: ESOPHAGOGASTRODUODENOSCOPY (EGD);  Surgeon: Lafayette Dragon, MD;  Location: [facility];  Service: Endoscopy;  Laterality: N/A;  . LEFT HEART CATH AND CORONARY ANGIOGRAPHY N/A 02/18/2017  Procedure: LEFT HEART CATH AND CORONARY ANGIOGRAPHY;  Surgeon: Belva Crome, MD;  Location: [facility];  Service: Cardiovascular;  Laterality: N/A;  . LUMBAR [place]  . NEGATIVE SLEEP STUDY  2010  . SHOULDER ARTHROSCOPY Left 12-23-2011  . TRANSTHORACIC ECHOCARDIOGRAM  04-21-2012   GRADE I DIASTOLIC DYSFUNCTION/  EF 60-65%/  MILD MR  &  TR  . TRANSURETHRAL RESECTION OF BLADDER TUMOR Right 01/03/2015   Procedure: TRANSURETHRAL RESECTION OF BLADDER TUMOR (TURBT);  Surgeon: Carolan Clines, MD;  Location: [facility];  Service: Urology;  Laterality: Right;    Family History  Problem Relation Age of Onset  . Heart disease Mother   . Colon polyps Mother   . Alzheimer's disease Mother   . Dementia Mother   . CAD Mother   . Heart disease Father   . Diverticulosis Father   . CAD Father        in his 70s  . Heart disease Brother   . Osteoarthritis Other        Multiple family members  .  Obesity Other        Multiple family members  . Depression Other        Multiple family members  . Breast cancer Sister 71       Pam  . Colon cancer Neg Hx     Social History   Socioeconomic History  . Marital status: Married    Spouse name: Not on file  . Number of children: 2  . Years of education: Not on file  . Highest education level: Not on file  Occupational History  . Occupation: caregiver  Social Needs  . Financial resource strain: Not on file  . Food insecurity:    Worry: Not on file    Inability: Not on file  . Transportation needs:    Medical: Not on file    Non-medical: Not on file  Tobacco Use  . Smoking status: Former Smoker    Years: 10.00    Types: Cigarettes    Last attempt to quit: 06/10/1973    Years since quitting: 44.4  . Smokeless tobacco: Never Used  Substance and Sexual Activity  . Alcohol use: Yes    Alcohol/week: 1.2 oz    Types: 2 Glasses of wine per week    Comment: social  . Drug use: No  . Sexual activity: Yes  Lifestyle  . Physical activity:    Days per week: Not on file    Minutes per session: Not on file  . Stress: Not on file  Relationships  . Social connections:    Talks on phone: Not on file    Gets together: Not on file    Attends religious service: Not on file    Active member of club or organization: Not on file    Attends meetings of clubs or organizations: Not on file    Relationship status: Not on file  . Intimate partner violence:    Fear of current or ex partner: Not on file    Emotionally abused: Not on file    Physically abused: Not on file    Forced sexual activity: Not on file  Other Topics Concern  . Not on file  Social History Narrative  . Not on file      Review of systems: Review of Systems  Constitutional: Negative for fever and chills.  HENT: Negative.   Eyes: Negative for blurred vision.  Respiratory: Negative for  cough, shortness of breath and wheezing.   Cardiovascular: Negative for chest  pain and palpitations.  Gastrointestinal: as per HPI Genitourinary: Negative for dysuria, urgency, frequency and hematuria.  Musculoskeletal: Positive for myalgias, back pain and joint pain.  Skin: Negative for itching and rash.  Neurological: Negative for dizziness, tremors, focal weakness, seizures and loss of consciousness.  Endo/Heme/Allergies: Positive for seasonal allergies.  Psychiatric/Behavioral: Negative for depression, suicidal ideas and hallucinations.  All other systems reviewed and are negative.   Physical Exam: Vitals:   11/25/17 1539  BP: 110/60  Pulse: 64   Body mass index is 24.04 kg/m. Gen:      No acute distress HEENT:  EOMI, sclera anicteric Neck:     No masses; no thyromegaly Lungs:    Clear to auscultation bilaterally; normal respiratory effort CV:         Regular rate and rhythm; no murmurs Abd:      + bowel sounds; soft, non-tender; no palpable masses, no distension Ext:    No edema; adequate peripheral perfusion Skin:      Warm and dry; no rash Neuro: alert and oriented x 3 Psych: normal mood and affect  Data Reviewed:  Reviewed labs, radiology imaging, old records and pertinent past GI work up   Assessment and Plan/Recommendations:  67 year old female with history of chronic GERD, his facial spasms, pelvic/rectal spasms, irritable bowel syndrome complains of bloating, abdominal cramps and vaginal prolapse Advised patient to avoid lifting heavy weights Will refer to GYN for evaluation of vaginal prolapse and referred to pelvic floor PT Continue antireflux measures Trial of FD Gard and IB Gard 1 capsule up to 3 times daily as needed for dyspepsia and irritable bowel syndrome Return as needed   25 minutes was spent face-to-face with the patient. Greater than 50% of the time used for counseling as well as treatment plan and follow-up. She had multiple questions which were answered to her satisfaction  K. Denzil Magnuson , MD [phone]    CC:  Leeroy Cha

## 2017-12-03 ENCOUNTER — Encounter: Payer: Self-pay | Admitting: Gastroenterology

## 2017-12-04 ENCOUNTER — Telehealth: Payer: Self-pay | Admitting: Interventional Cardiology

## 2017-12-04 DIAGNOSIS — R300 Dysuria: Secondary | ICD-10-CM | POA: Diagnosis not present

## 2017-12-04 DIAGNOSIS — C674 Malignant neoplasm of posterior wall of bladder: Secondary | ICD-10-CM | POA: Diagnosis not present

## 2017-12-04 DIAGNOSIS — N3941 Urge incontinence: Secondary | ICD-10-CM | POA: Diagnosis not present

## 2017-12-04 DIAGNOSIS — N952 Postmenopausal atrophic vaginitis: Secondary | ICD-10-CM | POA: Diagnosis not present

## 2017-12-04 NOTE — Telephone Encounter (Signed)
New message    Pt c/o medication issue:  1. Name of Medication: atorvastatin (LIPITOR) 80 MG tablet  2. How are you currently taking this medication (dosage and times per day)?   3. Are you having a reaction (difficulty breathing--STAT)? Joint pain  4. What is your medication issue?  Requesting to stop med due to body ache/pains

## 2017-12-04 NOTE — Telephone Encounter (Signed)
Needs to be on statin therapy. But okay to stop for 1 month to see if resolves then resume. Notify with response.

## 2017-12-04 NOTE — Telephone Encounter (Addendum)
Pt has been having joint pain (back, hips) for a couple of months.  Has worsened the last month.  Pt has been on Atorvastatin for awhile but another MD told her she could develop myalgias later.  Pt takes Atorvastatin 40mg  QD.  Will route to Dr. Tamala Julian for review and advisement.

## 2017-12-05 NOTE — Telephone Encounter (Signed)
Spoke with pt and made her aware of recommendations per Dr. Tamala Julian. Pt verbalized understanding and was in agreement with this plan.

## 2017-12-09 DIAGNOSIS — N3941 Urge incontinence: Secondary | ICD-10-CM | POA: Diagnosis not present

## 2017-12-09 DIAGNOSIS — C678 Malignant neoplasm of overlapping sites of bladder: Secondary | ICD-10-CM | POA: Diagnosis not present

## 2017-12-09 DIAGNOSIS — M6281 Muscle weakness (generalized): Secondary | ICD-10-CM | POA: Diagnosis not present

## 2017-12-09 DIAGNOSIS — N819 Female genital prolapse, unspecified: Secondary | ICD-10-CM | POA: Diagnosis not present

## 2017-12-09 DIAGNOSIS — M62838 Other muscle spasm: Secondary | ICD-10-CM | POA: Diagnosis not present

## 2017-12-21 ENCOUNTER — Other Ambulatory Visit: Payer: Self-pay | Admitting: Cardiology

## 2017-12-24 DIAGNOSIS — N3941 Urge incontinence: Secondary | ICD-10-CM | POA: Diagnosis not present

## 2017-12-24 DIAGNOSIS — M6281 Muscle weakness (generalized): Secondary | ICD-10-CM | POA: Diagnosis not present

## 2017-12-24 DIAGNOSIS — R350 Frequency of micturition: Secondary | ICD-10-CM | POA: Diagnosis not present

## 2017-12-24 DIAGNOSIS — C678 Malignant neoplasm of overlapping sites of bladder: Secondary | ICD-10-CM | POA: Diagnosis not present

## 2018-01-06 DIAGNOSIS — M25562 Pain in left knee: Secondary | ICD-10-CM | POA: Diagnosis not present

## 2018-01-06 DIAGNOSIS — M25569 Pain in unspecified knee: Secondary | ICD-10-CM | POA: Diagnosis not present

## 2018-01-07 NOTE — Progress Notes (Signed)
Office Visit Note  Patient: [name]             Date of Birth: Oct 09, 1950           MRN: [id number]             PCP: Leeroy Cha, MD Referring: Leeroy Cha,* Visit Date: 01/12/2018 Occupation: @GUAROCC @  Subjective:  Pain in multiple joints and myalgia   History of Present Illness: [name] is a 67 y.o. female with history of osteoarthritis and pseudogout.  She returns today after her last visit in 2015.  She states after changing her diet she had done really well until recently.  She states she had bladder cancer and had BCG treatment until 2017.  She states after that she started having increased pain all over.  She states she has long-standing history of disc disease of lumbar spine.  She has had cortisone injections and also had surgery in the past.  She has been experiencing increased lower back pain and SI joint pain.  She also started experiencing increased lower extremity muscle pain since March 2019.  She has pain in her bilateral wrist joints off and on.  She also has some trigger finger for which she has been seen by hand surgeons at [place].  She continues to have knee joint discomfort for which she is seen orthopedic surgeons at [place].  She was recently told that her x-rays were consistent with pseudogout.  She states she does workout on a regular basis and she does have discomfort in her knee joint while exercising.  She has been experiencing discomfort in her SI joints as well.  She has been experiencing some feet discomfort without any joint swelling.  Activities of Daily Living:  Patient reports morning stiffness for 10- 15 minutes.   Patient Reports nocturnal pain.  Difficulty dressing/grooming: Denies Difficulty climbing stairs: Reports Difficulty getting out of chair: Reports Difficulty using hands for taps, buttons, cutlery, and/or writing: Reports  Review of Systems  Constitutional: Positive for  fatigue. Negative for night sweats, weight gain and weight loss.  HENT: Negative for mouth sores, trouble swallowing, trouble swallowing, mouth dryness and nose dryness.   Eyes: Positive for dryness. Negative for pain, redness and visual disturbance.  Respiratory: Negative for cough, shortness of breath and difficulty breathing.   Cardiovascular: Negative for chest pain, palpitations, hypertension, irregular heartbeat and swelling in legs/feet.  Gastrointestinal: Negative for blood in stool, constipation and diarrhea.  Endocrine: Negative for increased urination.  Genitourinary: Negative for vaginal dryness.  Musculoskeletal: Positive for arthralgias, joint pain, myalgias, muscle weakness, morning stiffness, muscle tenderness and myalgias. Negative for joint swelling.  Skin: Negative for color change, rash, hair loss, skin tightness, ulcers and sensitivity to sunlight.  Allergic/Immunologic: Negative for susceptible to infections.  Neurological: Positive for numbness and weakness. Negative for dizziness, headaches, memory loss and night sweats.  Hematological: Negative for swollen glands.  Psychiatric/Behavioral: Positive for sleep disturbance. Negative for depressed mood. The patient is not nervous/anxious.     PMFS History:  Patient Active Problem List   Diagnosis Date Noted  . NSTEMI (non-ST elevated myocardial infarction) ([place]) 02/18/2017  . Rectal spasm 04/11/2015  . Rectal bleeding 03/23/2013  . Reflux esophagitis 04/30/2012  . Other dysphagia 04/30/2012  . Syncope 04/20/2012  . Cerebral artery occlusion with cerebral infarction ([place]) 01/17/2010  . FREQUENCY, URINARY 01/17/2010  . Hyperlipidemia 08/05/2007  . Essential hypertension 08/05/2007  . Coronary artery disease involving native coronary artery of native  heart with angina pectoris ([place]) 08/05/2007  . GERD (gastroesophageal reflux disease) 08/05/2007  . Irritable bowel syndrome 08/05/2007  . PROCTALGIA FUGAX 08/05/2007      Past Medical History:  Diagnosis Date  . Anxiety    Psychiatry Dr. Stan Head, concerns for memory loss, anxiety  . Bladder tumor   . Broken heart syndrome   . Cancer ([place])    bladder CA  . Chronic chest pain   . Coronary atherosclerosis CARDIOLOGIST-  DR Daneen Schick   Cath 11/2006 demonstrating moderate obstructive coronary disease with 70-80% septal perforator #1, 50-70% first diagonal, 80% third diagonal, 50% mid and distal LAD & heavy calcification throughout all 3 coronaries. LVF normal.  . Esophageal motility disorder   . Essential hypertension, benign   . Fatty liver 04/28/12  . GERD (gastroesophageal reflux disease)   . H/O esophageal spasm   . Heart murmur   . History of esophageal spasm    takes ranexa  . History of TIA (transient ischemic attack)    2011--  no residuals  . Hyperlipidemia   . IBS (irritable bowel syndrome)    Diarrhea predominent, esophageal spasm severe, GERD - Dr. Elicia Lamp  . Interstitial cystitis    Dr. Gaynelle Arabian  . Memory loss    Psychiatry Dr. Stan Head, concerns for memory loss, anxiety  . PONV (postoperative nausea and vomiting)    AND URINARY RETENTION  . Sensation of pressure in bladder area   . TMJ (temporomandibular joint syndrome)   . Wears glasses     Family History  Problem Relation Age of Onset  . Heart disease Mother   . Colon polyps Mother   . Alzheimer's disease Mother   . Dementia Mother   . CAD Mother   . Heart disease Father   . Diverticulosis Father   . CAD Father        in his 85s  . Heart disease Brother   . Osteoarthritis Other        Multiple family members  . Obesity Other        Multiple family members  . Depression Other        Multiple family members  . Breast cancer Sister 47       Pam  . Colon cancer Neg Hx    Past Surgical History:  Procedure Laterality Date  . ABDOMINAL HYSTERECTOMY  AGE 62 -- 1989  . APPENDECTOMY  1971   AND OVARIAN CYSTECTOMY  . BENIGN EXCISIONAL BREAST BX  1996  .  BREAST EXCISIONAL BIOPSY Right 1992  . CARDIAC CATHETERIZATION  11-28-2006   DR Daneen Schick    moderate cad/  70-80% septal perforator #1, 50-70% first diagonal, 80% third diagonal, 50% mid and distal LAD & heavy calcification throughout all 3 coronaries. LVF normal.  . CARDIAC CATHETERIZATION  03-26-2012   DR Daneen Schick   patent CFX,  RCA,  & pLAD/  mLAD >50% to <70% a focal eccentric region that overlaps the third diagonal/  90% diagonal ostial and unchanged from prior study /   ef 65%  . CYSTO WITH HYDRODISTENSION N/A 01/14/2014   Procedure: CYSTOSCOPY/HYDRODISTENSION/INSERTION OF MARCAINE AND PYRIDUIM INTO BLADDER/INJECTION OF MARCAINE AND KENALOG into sub trigone space ;  Surgeon: Ailene Rud, MD;  Location: [facility];  Service: Urology;  Laterality: N/A;  . CYSTO/  HYDRODISTENTION/  INSTILLATION THERAPY  03-12-2010  . ESOPHAGOGASTRODUODENOSCOPY  04/30/2012   Procedure: ESOPHAGOGASTRODUODENOSCOPY (EGD);  Surgeon: Lafayette Dragon, MD;  Location: [facility];  Service: Endoscopy;  Laterality: N/A;  . LEFT HEART CATH AND CORONARY ANGIOGRAPHY N/A 02/18/2017   Procedure: LEFT HEART CATH AND CORONARY ANGIOGRAPHY;  Surgeon: Belva Crome, MD;  Location: [facility];  Service: Cardiovascular;  Laterality: N/A;  . LUMBAR [place]  . NEGATIVE SLEEP STUDY  2010  . SHOULDER ARTHROSCOPY Left 12-23-2011  . TRANSTHORACIC ECHOCARDIOGRAM  04-21-2012   GRADE I DIASTOLIC DYSFUNCTION/  EF 60-65%/  MILD MR  &  TR  . TRANSURETHRAL RESECTION OF BLADDER TUMOR Right 01/03/2015   Procedure: TRANSURETHRAL RESECTION OF BLADDER TUMOR (TURBT);  Surgeon: Carolan Clines, MD;  Location: [facility];  Service: Urology;  Laterality: Right;   Social History   Social History Narrative  . Not on file    Objective: Vital Signs: BP 110/67 (BP Location: Right Arm, Patient Position: Sitting, Cuff Size: Normal)   Pulse (!) 58   Ht 5' 2.5" (1.588 m)   Wt 136 lb (61.7  kg)   BMI 24.48 kg/m    Physical Exam  Constitutional: She is oriented to person, place, and time. She appears well-developed and well-nourished.  HENT:  Head: Normocephalic and atraumatic.  Eyes: Conjunctivae and EOM are normal.  Neck: Normal range of motion.  Cardiovascular: Normal rate, regular rhythm, normal heart sounds and intact distal pulses.  Pulmonary/Chest: Effort normal and breath sounds normal.  Abdominal: Soft. Bowel sounds are normal.  Lymphadenopathy:    She has no cervical adenopathy.  Neurological: She is alert and oriented to person, place, and time.  Skin: Skin is warm and dry. Capillary refill takes less than 2 seconds.  Psychiatric: She has a normal mood and affect. Her behavior is normal.  Nursing note and vitals reviewed.    Musculoskeletal Exam: C-spine thoracic spine good range of motion.  She has some limitation of lumbar spine and had some postsurgical changes.  Shoulder joints elbow joints were in good range of motion.  She has DIP and PIP thickening in her hands consistent with osteoarthritis without any inflammatory changes.  Hip joints knee joints ankles were in good range of motion.  She has thickening of PIP DIP and her feet and first MTP consistent with osteoarthritis.  She has some trochanteric bursitis on palpation.  She is tenderness over SI joints.   CDAI Exam: No CDAI exam completed.   Investigation: No additional findings.  Imaging: Xr Lumbar Spine 2-3 Views  Result Date: 01/12/2018 No significant disc space narrowing was noted.  Facet joint arthropathy was noted.  Xr Pelvis 1-2 Views  Result Date: 01/12/2018 No SI joint to sclerosis or narrowing was noted.   Recent Labs: Lab Results  Component Value Date   WBC 3.8 (L) 02/20/2017   HGB 9.8 (L) 02/20/2017   PLT 205 02/20/2017   NA 138 02/18/2017   K 3.8 02/18/2017   CL 105 02/18/2017   CO2 24 02/18/2017   GLUCOSE 110 (H) 02/18/2017   BUN 9 02/18/2017   CREATININE 0.62  02/18/2017   BILITOT 0.5 02/18/2017   ALKPHOS 78 02/18/2017   AST 35 02/18/2017   ALT 26 02/18/2017   PROT 6.5 02/18/2017   ALBUMIN 3.8 02/18/2017   CALCIUM 8.8 (L) 02/18/2017   GFRAA >60 02/18/2017    Speciality Comments: No specialty comments available.  Procedures:  No procedures performed Allergies: Clindamycin/lincomycin; Doxycycline; Escitalopram oxalate; Hyoscyamine; Macrodantin; Trazodone; Trazodone and nefazodone; Trimethoprim; Levsin [hyoscyamine sulfate]; Lincomycin; Nitrofurantoin; and Nitrofurantoin macrocrystal   Assessment / Plan:  Visit Diagnoses: H/O calcium pyrophosphate deposition disease (CPPD) -was diagnosed with CPPD by me several years ago.  She had an quite well as regards to pseudogout.  She states she does feel some intermittent pain and discomfort.  We discussed natural anti-inflammatories.  Myalgia -she has been experiencing increased muscle pain especially in her bilateral lower extremities.  She had good muscle strength on examination today.  I will obtain following labs to evaluate this further.  Plan: CK, TSH, VITAMIN D 25 Hydroxy (Vit-D Deficiency, Fractures)  Other fatigue -she has been also experiencing increased fatigue.  Plan: CBC with Differential/Platelet, COMPLETE METABOLIC PANEL WITH GFR, Sedimentation rate, Serum protein electrophoresis with reflex  Chronic SI joint pain -she has tenderness on palpation over SI joints.  Plan: XR Pelvis 1-2 Views.  The SI joint x-ray was unremarkable.  Pain in both hands -the clinical findings are consistent with osteoarthritis with DIP and PIP thickening patient gives history of intermittent swelling.  Plan: ANA, Rheumatoid factor, Cyclic citrul peptide antibody, IgG  Primary osteoarthritis of both hands  Primary osteoarthritis of both knees-patient has long-standing history of osteoarthritis in her knee joints.  Currently she has been seen at [facility].  She will bring x-rays next visit.  DDD  (degenerative disc disease), lumbar -she has a history of disc disease of lumbar spine is status post discectomy.  She does get epidural injections intermittently.  Plan: XR Lumbar Spine 2-3 Views.  The x-ray revealed facet joint arthropathy.   Other medical problems are listed as follows:  History of hyperlipidemia  Essential hypertension  History of coronary artery disease  History of gastroesophageal reflux (GERD)  History of IBS  Cerebral artery occlusion with cerebral infarction [facility])  History of bladder cancer - Treated with BCG 2017   Orders: Orders Placed This Encounter  Procedures  . XR Lumbar Spine 2-3 Views  . XR Pelvis 1-2 Views  . CBC with Differential/Platelet  . COMPLETE METABOLIC PANEL WITH GFR  . CK  . TSH  . VITAMIN D 25 Hydroxy (Vit-D Deficiency, Fractures)  . Sedimentation rate  . Serum protein electrophoresis with reflex  . ANA  . Rheumatoid factor  . Cyclic citrul peptide antibody, IgG   No orders of the defined types were placed in this encounter.   Face-to-face time spent with patient was 50 minutes. Greater than 50% of time was spent in counseling and coordination of care.  Follow-Up Instructions: No follow-ups on file.   Bo Merino, MD  Note - This record has been created using Editor, commissioning.  Chart creation errors have been sought, but may not always  have been located. Such creation errors do not reflect on  the standard of medical care.

## 2018-01-12 ENCOUNTER — Ambulatory Visit (INDEPENDENT_AMBULATORY_CARE_PROVIDER_SITE_OTHER): Payer: Medicare Other

## 2018-01-12 ENCOUNTER — Ambulatory Visit (INDEPENDENT_AMBULATORY_CARE_PROVIDER_SITE_OTHER): Payer: Medicare Other | Admitting: Rheumatology

## 2018-01-12 ENCOUNTER — Encounter: Payer: Self-pay | Admitting: Rheumatology

## 2018-01-12 VITALS — BP 110/67 | HR 58 | Ht 62.5 in | Wt 136.0 lb

## 2018-01-12 DIAGNOSIS — M79641 Pain in right hand: Secondary | ICD-10-CM

## 2018-01-12 DIAGNOSIS — M5136 Other intervertebral disc degeneration, lumbar region: Secondary | ICD-10-CM | POA: Diagnosis not present

## 2018-01-12 DIAGNOSIS — Z8739 Personal history of other diseases of the musculoskeletal system and connective tissue: Secondary | ICD-10-CM

## 2018-01-12 DIAGNOSIS — M79642 Pain in left hand: Secondary | ICD-10-CM

## 2018-01-12 DIAGNOSIS — Z8679 Personal history of other diseases of the circulatory system: Secondary | ICD-10-CM | POA: Diagnosis not present

## 2018-01-12 DIAGNOSIS — I6350 Cerebral infarction due to unspecified occlusion or stenosis of unspecified cerebral artery: Secondary | ICD-10-CM | POA: Diagnosis not present

## 2018-01-12 DIAGNOSIS — R5383 Other fatigue: Secondary | ICD-10-CM

## 2018-01-12 DIAGNOSIS — M533 Sacrococcygeal disorders, not elsewhere classified: Secondary | ICD-10-CM

## 2018-01-12 DIAGNOSIS — G8929 Other chronic pain: Secondary | ICD-10-CM | POA: Diagnosis not present

## 2018-01-12 DIAGNOSIS — M19041 Primary osteoarthritis, right hand: Secondary | ICD-10-CM | POA: Diagnosis not present

## 2018-01-12 DIAGNOSIS — M51369 Other intervertebral disc degeneration, lumbar region without mention of lumbar back pain or lower extremity pain: Secondary | ICD-10-CM

## 2018-01-12 DIAGNOSIS — I10 Essential (primary) hypertension: Secondary | ICD-10-CM

## 2018-01-12 DIAGNOSIS — Z8639 Personal history of other endocrine, nutritional and metabolic disease: Secondary | ICD-10-CM | POA: Diagnosis not present

## 2018-01-12 DIAGNOSIS — M19042 Primary osteoarthritis, left hand: Secondary | ICD-10-CM

## 2018-01-12 DIAGNOSIS — M170 Bilateral primary osteoarthritis of knee: Secondary | ICD-10-CM | POA: Diagnosis not present

## 2018-01-12 DIAGNOSIS — Z8551 Personal history of malignant neoplasm of bladder: Secondary | ICD-10-CM

## 2018-01-12 DIAGNOSIS — M791 Myalgia, unspecified site: Secondary | ICD-10-CM

## 2018-01-12 DIAGNOSIS — Z8719 Personal history of other diseases of the digestive system: Secondary | ICD-10-CM | POA: Diagnosis not present

## 2018-01-12 NOTE — Patient Instructions (Signed)
Natural anti-inflammatories  You can purchase these at [employer], [employer] or online.   . Ginger (ginger root or capsules)  . Omega 3 (Fish, flax seeds, chia seeds, walnuts, almonds)  . Tart cherry (dried or extract or tablets)   Patient should be under the care of a physician while taking these supplements. This may not be reproduced without the permission of Dr. Bo Merino.

## 2018-01-13 ENCOUNTER — Telehealth: Payer: Self-pay | Admitting: *Deleted

## 2018-01-13 NOTE — Telephone Encounter (Signed)
Faxed records to GYN today

## 2018-01-15 NOTE — Progress Notes (Signed)
Please add ENA if possible

## 2018-01-16 LAB — CBC WITH DIFFERENTIAL/PLATELET
Basophils Absolute: 28 cells/uL (ref 0–200)
Basophils Relative: 0.7 %
Eosinophils Absolute: 88 cells/uL (ref 15–500)
Eosinophils Relative: 2.2 %
HCT: 37.1 % (ref 35.0–45.0)
Hemoglobin: 11.8 g/dL (ref 11.7–15.5)
Lymphs Abs: 1108 cells/uL (ref 850–3900)
MCH: 28.9 pg (ref 27.0–33.0)
MCHC: 31.8 g/dL — ABNORMAL LOW (ref 32.0–36.0)
MCV: 90.9 fL (ref 80.0–100.0)
MPV: 10.4 fL (ref 7.5–12.5)
Monocytes Relative: 9.7 %
Neutro Abs: 2388 cells/uL (ref 1500–7800)
Neutrophils Relative %: 59.7 %
Platelets: 265 10*3/uL (ref 140–400)
RBC: 4.08 10*6/uL (ref 3.80–5.10)
RDW: 13.4 % (ref 11.0–15.0)
Total Lymphocyte: 27.7 %
WBC mixed population: 388 cells/uL (ref 200–950)
WBC: 4 10*3/uL (ref 3.8–10.8)

## 2018-01-16 LAB — TEST AUTHORIZATION

## 2018-01-16 LAB — COMPLETE METABOLIC PANEL WITH GFR
AG Ratio: 1.7 (calc) (ref 1.0–2.5)
ALT: 14 U/L (ref 6–29)
AST: 17 U/L (ref 10–35)
Albumin: 4.4 g/dL (ref 3.6–5.1)
Alkaline phosphatase (APISO): 82 U/L (ref 33–130)
BUN: 11 mg/dL (ref 7–25)
CO2: 31 mmol/L (ref 20–32)
Calcium: 9.4 mg/dL (ref 8.6–10.4)
Chloride: 104 mmol/L (ref 98–110)
Creat: 0.8 mg/dL (ref 0.50–0.99)
GFR, Est African American: 89 mL/min/{1.73_m2} (ref >=60)
GFR, Est Non African American: 77 mL/min/{1.73_m2} (ref >=60)
Globulin: 2.6 g/dL (calc) (ref 1.9–3.7)
Glucose, Bld: 68 mg/dL (ref 65–99)
Potassium: 4.5 mmol/L (ref 3.5–5.3)
Sodium: 141 mmol/L (ref 135–146)
Total Bilirubin: 0.3 mg/dL (ref 0.2–1.2)
Total Protein: 7 g/dL (ref 6.1–8.1)

## 2018-01-16 LAB — PROTEIN ELECTROPHORESIS, SERUM, WITH REFLEX
Albumin ELP: 4 g/dL (ref 3.8–4.8)
Alpha 1: 0.2 g/dL (ref 0.2–0.3)
Alpha 2: 0.6 g/dL (ref 0.5–0.9)
Beta 2: 0.3 g/dL (ref 0.2–0.5)
Beta Globulin: 0.4 g/dL (ref 0.4–0.6)
Gamma Globulin: 0.9 g/dL (ref 0.8–1.7)
Total Protein: 6.5 g/dL (ref 6.1–8.1)

## 2018-01-16 LAB — RHEUMATOID FACTOR: Rhuematoid fact SerPl-aCnc: <14 IU/mL (ref <14)

## 2018-01-16 LAB — VITAMIN D 25 HYDROXY (VIT D DEFICIENCY, FRACTURES): Vit D, 25-Hydroxy: 44 ng/mL (ref 30–100)

## 2018-01-16 LAB — SEDIMENTATION RATE: Sed Rate: 9 mm/h (ref 0–30)

## 2018-01-16 LAB — CYCLIC CITRUL PEPTIDE ANTIBODY, IGG: Cyclic Citrullin Peptide Ab: <16 UNITS

## 2018-01-16 LAB — SM AND SM/RNP ANTIBODIES
ENA SM Ab Ser-aCnc: <1 AI
SM/RNP: <1 AI

## 2018-01-16 LAB — CK: Total CK: 64 U/L (ref 29–143)

## 2018-01-16 LAB — ANTI-DNA ANTIBODY, DOUBLE-STRANDED: ds DNA Ab: <1 IU/mL

## 2018-01-16 LAB — ANTI-NUCLEAR AB-TITER (ANA TITER): ANA Titer 1: 1:80 {titer} — ABNORMAL HIGH

## 2018-01-16 LAB — TSH: TSH: 1.72 mIU/L (ref 0.40–4.50)

## 2018-01-16 LAB — ANA: Anti Nuclear Antibody(ANA): POSITIVE — AB

## 2018-01-16 LAB — SJOGREN'S SYNDROME ANTIBODS(SSA + SSB)
SSA (Ro) (ENA) Antibody, IgG: <1 AI
SSB (La) (ENA) Antibody, IgG: <1 AI

## 2018-01-16 LAB — ANTI-SCLERODERMA ANTIBODY: Scleroderma (Scl-70) (ENA) Antibody, IgG: <1 AI

## 2018-01-21 DIAGNOSIS — M25562 Pain in left knee: Secondary | ICD-10-CM | POA: Diagnosis not present

## 2018-01-23 ENCOUNTER — Ambulatory Visit (INDEPENDENT_AMBULATORY_CARE_PROVIDER_SITE_OTHER): Payer: Medicare Other | Admitting: Interventional Cardiology

## 2018-01-23 ENCOUNTER — Encounter: Payer: Self-pay | Admitting: Interventional Cardiology

## 2018-01-23 VITALS — BP 110/64 | HR 55 | Ht 62.5 in | Wt 131.6 lb

## 2018-01-23 DIAGNOSIS — I6350 Cerebral infarction due to unspecified occlusion or stenosis of unspecified cerebral artery: Secondary | ICD-10-CM

## 2018-01-23 DIAGNOSIS — I2510 Atherosclerotic heart disease of native coronary artery without angina pectoris: Secondary | ICD-10-CM | POA: Diagnosis not present

## 2018-01-23 DIAGNOSIS — I25119 Atherosclerotic heart disease of native coronary artery with unspecified angina pectoris: Secondary | ICD-10-CM

## 2018-01-23 DIAGNOSIS — R413 Other amnesia: Secondary | ICD-10-CM | POA: Diagnosis not present

## 2018-01-23 DIAGNOSIS — E7849 Other hyperlipidemia: Secondary | ICD-10-CM

## 2018-01-23 DIAGNOSIS — I10 Essential (primary) hypertension: Secondary | ICD-10-CM

## 2018-01-23 NOTE — Progress Notes (Signed)
Cardiology Office Note:    Date:  01/23/2018   ID:  [name], DOB [birth year], MRN [id number]  PCP:  Leeroy Cha, MD  Cardiologist:  No primary care provider on file.   Referring MD: Leeroy Cha,*   Chief Complaint  Patient presents with  . Coronary Artery Disease    Angina    History of Present Illness:    [name] is a 67 y.o. female with a hx of hyperlipidemia, severe diffuse CAD, class III exertional angina, and anxiety disorder.  Overall she has had some issues with musculoskeletal discomfort that did not improve with cessation of statin therapy.  She was unable to exercise for a while.  She is now back to aerobic activity.  Initially she had a difficult time trying to do any exercise without angina.  She is persistently stable with efforts to improve aerobic conditioning and now is able to exercise without much in the way of angina although there is a walk-through phenomenon.  No discomfort at rest..  Past Medical History:  Diagnosis Date  . Anxiety    Psychiatry Dr. Stan Head, concerns for memory loss, anxiety  . Bladder tumor   . Broken heart syndrome   . Cancer ([place])    bladder CA  . Chronic chest pain   . Coronary atherosclerosis CARDIOLOGIST-  DR Daneen Schick   Cath 11/2006 demonstrating moderate obstructive coronary disease with 70-80% septal perforator #1, 50-70% first diagonal, 80% third diagonal, 50% mid and distal LAD & heavy calcification throughout all 3 coronaries. LVF normal.  . Esophageal motility disorder   . Essential hypertension, benign   . Fatty liver 04/28/12  . GERD (gastroesophageal reflux disease)   . H/O esophageal spasm   . Heart murmur   . History of esophageal spasm    takes ranexa  . History of TIA (transient ischemic attack)    2011--  no residuals  . Hyperlipidemia   . IBS (irritable bowel syndrome)    Diarrhea predominent, esophageal spasm severe, GERD - Dr. Elicia Lamp  . Interstitial cystitis    Dr. Gaynelle Arabian  . Memory loss    Psychiatry Dr. Stan Head, concerns for memory loss, anxiety  . PONV (postoperative nausea and vomiting)    AND URINARY RETENTION  . Sensation of pressure in bladder area   . TMJ (temporomandibular joint syndrome)   . Wears glasses     Past Surgical History:  Procedure Laterality Date  . ABDOMINAL HYSTERECTOMY  AGE 46 -- 1989  . APPENDECTOMY  1971   AND OVARIAN CYSTECTOMY  . BENIGN EXCISIONAL BREAST BX  1996  . BREAST EXCISIONAL BIOPSY Right 1992  . CARDIAC CATHETERIZATION  11-28-2006   DR Daneen Schick    moderate cad/  70-80% septal perforator #1, 50-70% first diagonal, 80% third diagonal, 50% mid and distal LAD & heavy calcification throughout all 3 coronaries. LVF normal.  . CARDIAC CATHETERIZATION  03-26-2012   DR Daneen Schick   patent CFX,  RCA,  & pLAD/  mLAD >50% to <70% a focal eccentric region that overlaps the third diagonal/  90% diagonal ostial and unchanged from prior study /   ef 65%  . CYSTO WITH HYDRODISTENSION N/A 01/14/2014   Procedure: CYSTOSCOPY/HYDRODISTENSION/INSERTION OF MARCAINE AND PYRIDUIM INTO BLADDER/INJECTION OF MARCAINE AND KENALOG into sub trigone space ;  Surgeon: Ailene Rud, MD;  Location: [facility];  Service: Urology;  Laterality: N/A;  . CYSTO/  HYDRODISTENTION/  INSTILLATION THERAPY  03-12-2010  . ESOPHAGOGASTRODUODENOSCOPY  04/30/2012   Procedure: ESOPHAGOGASTRODUODENOSCOPY (EGD);  Surgeon: Lafayette Dragon, MD;  Location: [facility];  Service: Endoscopy;  Laterality: N/A;  . LEFT HEART CATH AND CORONARY ANGIOGRAPHY N/A 02/18/2017   Procedure: LEFT HEART CATH AND CORONARY ANGIOGRAPHY;  Surgeon: Belva Crome, MD;  Location: [place] [facility];  Service: Cardiovascular;  Laterality: N/A;  . LUMBAR [place]  . NEGATIVE SLEEP STUDY  2010  . SHOULDER ARTHROSCOPY Left 12-23-2011  . TRANSTHORACIC ECHOCARDIOGRAM  04-21-2012   GRADE I DIASTOLIC DYSFUNCTION/  EF 60-65%/  MILD MR  &  TR  .  TRANSURETHRAL RESECTION OF BLADDER TUMOR Right 01/03/2015   Procedure: TRANSURETHRAL RESECTION OF BLADDER TUMOR (TURBT);  Surgeon: Carolan Clines, MD;  Location: [facility];  Service: Urology;  Laterality: Right;    Current Medications: Current Meds  Medication Sig  . acyclovir (ZOVIRAX) 400 MG tablet Take 400 mg by mouth 2 (two) times daily.  . AMBULATORY NON FORMULARY MEDICATION as needed. Medication Name: enzyme as needed  . aspirin 81 MG tablet Take 81 mg by mouth at bedtime.   Marland Kitchen atorvastatin (LIPITOR) 80 MG tablet Take 1 tablet (80 mg total) by mouth daily at 6 PM. Please keep upcoming appt in August for future refills. Thank you  . BYSTOLIC 2.5 MG tablet TAKE 1 TABLET BY MOUTH EVERY DAY  . Cholecalciferol (VITAMIN D) 1000 UNITS capsule Take 1,000 Units by mouth daily.  Marland Kitchen dicyclomine (BENTYL) 10 MG capsule Take 1 capsule (10 mg total) by mouth 2 (two) times daily.  . Digestive Enzymes (MULTI-ENZYME) TABS Take 1 tablet by mouth as needed.  . diltiazem 2 % GEL Apply 1 application topically as needed. For rectal spasm  . esomeprazole (NEXIUM) 40 MG capsule Take 1 capsule (40 mg total) by mouth daily.  Marland Kitchen estradiol (CLIMARA - DOSED IN MG/24 HR) 0.025 mg/24hr patch Place 0.025 mg onto the skin once a week.  Marland Kitchen ibuprofen (ADVIL,MOTRIN) 200 MG tablet Take 600 mg by mouth every 6 (six) hours as needed for headache or moderate pain.  Marland Kitchen LORazepam (ATIVAN) 0.5 MG tablet Take 0.5 mg by mouth 3 (three) times daily as needed (Espohegeal spasms).  . nitroGLYCERIN (NITROSTAT) 0.4 MG SL tablet place 1 tablet under the tongue if needed every 5 minutes for chest pain for 3 doses IF NO RELIEF AFTER 3RD DOSE CALL PRESCRIBER OR 911.  Marland Kitchen oxyCODONE (OXY IR/ROXICODONE) 5 MG immediate release tablet Take 5 mg by mouth as needed for moderate pain.   Marland Kitchen Peppermint Oil (IBGARD) 90 MG CPCR Take 2 capsules by mouth as needed.  . Probiotic Product (PROBIOTIC ACIDOPHILUS) CAPS Take 1 capsule by mouth  daily.  . QUEtiapine (SEROQUEL) 25 MG tablet Take 25 mg by mouth at bedtime. For sleep  (pt takes seroquel OR temazepam for sleep)  . ranolazine (RANEXA) 500 MG 12 hr tablet Take 1 tablet (500 mg total) by mouth 2 (two) times daily.  . temazepam (RESTORIL) 15 MG capsule Take 15 mg by mouth See admin instructions. Approximately twice weekly for sleep instead of seroquel     Allergies:   Clindamycin/lincomycin; Doxycycline; Escitalopram oxalate; Hyoscyamine; Macrodantin; Trazodone; Trazodone and nefazodone; Trimethoprim; Levsin [hyoscyamine sulfate]; Lincomycin; Nitrofurantoin; and Nitrofurantoin macrocrystal   Social History   Socioeconomic History  . Marital status: Married    Spouse name: Not on file  . Number of children: 2  . Years of education: Not on file  . Highest education level: Not on file  Occupational History  .  Occupation: caregiver  Social Needs  . Financial resource strain: Not on file  . Food insecurity:    Worry: Not on file    Inability: Not on file  . Transportation needs:    Medical: Not on file    Non-medical: Not on file  Tobacco Use  . Smoking status: Former Smoker    Years: 10.00    Types: Cigarettes    Last attempt to quit: 06/10/1973    Years since quitting: 44.6  . Smokeless tobacco: Never Used  Substance and Sexual Activity  . Alcohol use: Yes    Alcohol/week: 2.0 standard drinks    Types: 2 Glasses of wine per week    Comment: social  . Drug use: No  . Sexual activity: Yes  Lifestyle  . Physical activity:    Days per week: Not on file    Minutes per session: Not on file  . Stress: Not on file  Relationships  . Social connections:    Talks on phone: Not on file    Gets together: Not on file    Attends religious service: Not on file    Active member of club or organization: Not on file    Attends meetings of clubs or organizations: Not on file    Relationship status: Not on file  Other Topics Concern  . Not on file  Social History  Narrative  . Not on file     Family History: The patient's family history includes Alzheimer's disease in her mother; Breast cancer (age of onset: 67) in her sister; CAD in her father and mother; Colon polyps in her mother; Dementia in her mother; Depression in her other; Diverticulosis in her father; Heart disease in her brother, father, and mother; Obesity in her other; Osteoarthritis in her other. There is no history of Colon cancer.  ROS:   Please see the history of present illness.    Back pain, muscle pain, otherwise unremarkable.  All other systems reviewed and are negative.  EKGs/Labs/Other Studies Reviewed:    The following studies were reviewed today: No new data  EKG:  EKG is done today and reveals sinus bradycardia 55 bpm.  Otherwise unremarkable.   Recent Labs: 02/18/2017: B Natriuretic Peptide 553.5 01/12/2018: ALT 14; BUN 11; Creat 0.80; Hemoglobin 11.8; Platelets 265; Potassium 4.5; Sodium 141; TSH 1.72  Recent Lipid Panel    Component Value Date/Time   CHOL 154 02/18/2017 0824   TRIG 84 02/18/2017 0824   HDL 64 02/18/2017 0824   CHOLHDL 2.4 02/18/2017 0824   VLDL 17 02/18/2017 0824   LDLCALC 73 02/18/2017 0824    Physical Exam:    VS:  BP 110/64   Pulse (!) 55   Ht 5' 2.5" (1.588 m)   Wt 131 lb 9.6 oz (59.7 kg)   BMI 23.69 kg/m     Wt Readings from Last 3 Encounters:  01/23/18 131 lb 9.6 oz (59.7 kg)  01/12/18 136 lb (61.7 kg)  11/25/17 132 lb 8 oz (60.1 kg)     GEN: Appears younger than stated age.  Well nourished, well developed in no acute distress HEENT: Normal NECK: No JVD. LYMPHATICS: No lymphadenopathy CARDIAC: RRR, no murmur, no gallop, no edema. VASCULAR: 2+ and symmetric dorsalis pedis and radial pulses.  No bruits. RESPIRATORY:  Clear to auscultation without rales, wheezing or rhonchi  ABDOMEN: Soft, non-tender, non-distended, No pulsatile mass, MUSCULOSKELETAL: No deformity  SKIN: Warm and dry NEUROLOGIC:  Alert and oriented x  3 PSYCHIATRIC:  Normal affect   ASSESSMENT:    1. Coronary artery disease involving native coronary artery of native heart with angina pectoris ([place])   2. Essential hypertension   3. Other hyperlipidemia   4. Cerebral artery occlusion with cerebral infarction ([place])   5. Memory deficit   6. Coronary artery disease involving native coronary artery of native heart without angina pectoris    PLAN:    In order of problems listed above:  1. Angina is class III.  There is a walk-through phenomenon.  Consider increasing [place] leucine to 1000 mg twice daily.  She is resistant to this idea currently.  Use nitroglycerin for for pain at rest. 2. Blood pressure is under great control. 3. LDL should be less than 70.  No recent blood work.  She will have this done at her primary care. 4. Not specifically addressed. 5. Significant decrease in memory.  Likely to microvascular obstructive disease.  Clinical follow-up in 1 year.   Medication Adjustments/Labs and Tests Ordered: Current medicines are reviewed at length with the patient today.  Concerns regarding medicines are outlined above.  Orders Placed This Encounter  Procedures  . EKG 12-Lead   No orders of the defined types were placed in this encounter.   Patient Instructions  Medication Instructions:  Your physician recommends that you continue on your current medications as directed. Please refer to the Current Medication list given to you today.  Labwork: None  Testing/Procedures: None  Follow-Up: Your physician wants you to follow-up in: 1 year with Dr. Tamala Julian.  You will receive a reminder letter in the mail two months in advance. If you don't receive a letter, please call our office to schedule the follow-up appointment.   Any Other Special Instructions Will Be Listed Below (If Applicable).     If you need a refill on your cardiac medications before your next appointment, please call your pharmacy.       Signed, Sinclair Grooms, MD  01/23/2018 2:54 PM    [facility]

## 2018-01-23 NOTE — Patient Instructions (Signed)

## 2018-01-28 DIAGNOSIS — M170 Bilateral primary osteoarthritis of knee: Secondary | ICD-10-CM | POA: Insufficient documentation

## 2018-01-28 DIAGNOSIS — M19041 Primary osteoarthritis, right hand: Secondary | ICD-10-CM | POA: Insufficient documentation

## 2018-01-28 DIAGNOSIS — Z8551 Personal history of malignant neoplasm of bladder: Secondary | ICD-10-CM | POA: Insufficient documentation

## 2018-01-28 DIAGNOSIS — M19042 Primary osteoarthritis, left hand: Secondary | ICD-10-CM | POA: Insufficient documentation

## 2018-01-28 DIAGNOSIS — M5136 Other intervertebral disc degeneration, lumbar region: Secondary | ICD-10-CM | POA: Insufficient documentation

## 2018-01-28 NOTE — Progress Notes (Signed)
Office Visit Note  Patient: [name]             Date of Birth: 1951/03/23           MRN: [id number]             PCP: Leeroy Cha, MD Referring: Leeroy Cha,* Visit Date: 02/10/2018 Occupation: '@[email or web address]' @  Subjective:    History of Present Illness: [name] is a 67 y.o. female with history of CPPD, osteoarthritis, and DDD.    Activities of Daily Living:  Patient reports morning stiffness   all day.   Patient Reports nocturnal pain.  Difficulty dressing/grooming: Denies Difficulty climbing stairs: Reports Difficulty getting out of chair: Reports Difficulty using hands for taps, buttons, cutlery, and/or writing: Denies  Review of Systems  Constitutional: Positive for fatigue.  HENT: Negative for mouth sores, mouth dryness and nose dryness.   Eyes: Positive for dryness. Negative for pain and visual disturbance.  Respiratory: Negative for cough, hemoptysis, shortness of breath and difficulty breathing.   Cardiovascular: Negative for chest pain, palpitations, hypertension and swelling in legs/feet.  Gastrointestinal: Positive for diarrhea. Negative for blood in stool and constipation.  Endocrine: Negative for increased urination.  Genitourinary: Negative for painful urination.  Musculoskeletal: Positive for arthralgias, joint pain, myalgias, morning stiffness, muscle tenderness and myalgias. Negative for joint swelling and muscle weakness.  Skin: Negative for color change, pallor, rash, hair loss, nodules/bumps, skin tightness, ulcers and sensitivity to sunlight.  Allergic/Immunologic: Negative for susceptible to infections.  Neurological: Negative for dizziness, numbness, headaches and weakness.  Hematological: Negative for swollen glands.  Psychiatric/Behavioral: Negative for depressed mood and sleep disturbance. The patient is not nervous/anxious.     PMFS History:  Patient Active Problem List   Diagnosis Date Noted  . Primary  osteoarthritis of both hands 01/28/2018  . Primary osteoarthritis of both knees 01/28/2018  . DDD (degenerative disc disease), lumbar 01/28/2018  . History of bladder cancer 01/28/2018  . NSTEMI (non-ST elevated myocardial infarction) ([place]) 02/18/2017  . Rectal spasm 04/11/2015  . Rectal bleeding 03/23/2013  . Reflux esophagitis 04/30/2012  . Other dysphagia 04/30/2012  . Syncope 04/20/2012  . Cerebral artery occlusion with cerebral infarction ([place]) 01/17/2010  . FREQUENCY, URINARY 01/17/2010  . Hyperlipidemia 08/05/2007  . Essential hypertension 08/05/2007  . Coronary artery disease involving native coronary artery of native heart with angina pectoris ([place]) 08/05/2007  . GERD (gastroesophageal reflux disease) 08/05/2007  . Irritable bowel syndrome 08/05/2007  . PROCTALGIA FUGAX 08/05/2007    Past Medical History:  Diagnosis Date  . Anxiety    Psychiatry Dr. Stan Head, concerns for memory loss, anxiety  . Bladder tumor   . Broken heart syndrome   . Cancer ([place])    bladder CA  . Chronic chest pain   . Coronary atherosclerosis CARDIOLOGIST-  DR Daneen Schick   Cath 11/2006 demonstrating moderate obstructive coronary disease with 70-80% septal perforator #1, 50-70% first diagonal, 80% third diagonal, 50% mid and distal LAD & heavy calcification throughout all 3 coronaries. LVF normal.  . Esophageal motility disorder   . Essential hypertension, benign   . Fatty liver 04/28/12  . GERD (gastroesophageal reflux disease)   . H/O esophageal spasm   . Heart murmur   . History of esophageal spasm    takes ranexa  . History of TIA (transient ischemic attack)    2011--  no residuals  . Hyperlipidemia   . IBS (irritable bowel syndrome)    Diarrhea predominent, esophageal spasm severe, GERD -  Dr. Elicia Lamp  . Interstitial cystitis    Dr. Gaynelle Arabian  . Memory loss    Psychiatry Dr. Stan Head, concerns for memory loss, anxiety  . PONV (postoperative nausea and vomiting)    AND  URINARY RETENTION  . Sensation of pressure in bladder area   . TMJ (temporomandibular joint syndrome)   . Wears glasses     Family History  Problem Relation Age of Onset  . Heart disease Mother   . Colon polyps Mother   . Alzheimer's disease Mother   . Dementia Mother   . CAD Mother   . Heart disease Father   . Diverticulosis Father   . CAD Father        in his 23s  . Heart disease Brother   . Osteoarthritis Other        Multiple family members  . Obesity Other        Multiple family members  . Depression Other        Multiple family members  . Breast cancer Sister 31       Pam  . Colon cancer Neg Hx    Past Surgical History:  Procedure Laterality Date  . ABDOMINAL HYSTERECTOMY  AGE 14 -- 1989  . APPENDECTOMY  1971   AND OVARIAN CYSTECTOMY  . BENIGN EXCISIONAL BREAST BX  1996  . BREAST EXCISIONAL BIOPSY Right 1992  . CARDIAC CATHETERIZATION  11-28-2006   DR Daneen Schick    moderate cad/  70-80% septal perforator #1, 50-70% first diagonal, 80% third diagonal, 50% mid and distal LAD & heavy calcification throughout all 3 coronaries. LVF normal.  . CARDIAC CATHETERIZATION  03-26-2012   DR Daneen Schick   patent CFX,  RCA,  & pLAD/  mLAD >50% to <70% a focal eccentric region that overlaps the third diagonal/  90% diagonal ostial and unchanged from prior study /   ef 65%  . CYSTO WITH HYDRODISTENSION N/A 01/14/2014   Procedure: CYSTOSCOPY/HYDRODISTENSION/INSERTION OF MARCAINE AND PYRIDUIM INTO BLADDER/INJECTION OF MARCAINE AND KENALOG into sub trigone space ;  Surgeon: Ailene Rud, MD;  Location: [facility];  Service: Urology;  Laterality: N/A;  . CYSTO/  HYDRODISTENTION/  INSTILLATION THERAPY  03-12-2010  . ESOPHAGOGASTRODUODENOSCOPY  04/30/2012   Procedure: ESOPHAGOGASTRODUODENOSCOPY (EGD);  Surgeon: Lafayette Dragon, MD;  Location: [facility];  Service: Endoscopy;  Laterality: N/A;  . LEFT HEART CATH AND CORONARY ANGIOGRAPHY N/A 02/18/2017   Procedure: LEFT  HEART CATH AND CORONARY ANGIOGRAPHY;  Surgeon: Belva Crome, MD;  Location: [place] [facility];  Service: Cardiovascular;  Laterality: N/A;  . LUMBAR [place]  . NEGATIVE SLEEP STUDY  2010  . SHOULDER ARTHROSCOPY Left 12-23-2011  . TRANSTHORACIC ECHOCARDIOGRAM  04-21-2012   GRADE I DIASTOLIC DYSFUNCTION/  EF 60-65%/  MILD MR  &  TR  . TRANSURETHRAL RESECTION OF BLADDER TUMOR Right 01/03/2015   Procedure: TRANSURETHRAL RESECTION OF BLADDER TUMOR (TURBT);  Surgeon: Carolan Clines, MD;  Location: [facility];  Service: Urology;  Laterality: Right;   Social History   Social History Narrative  . Not on file    Objective: Vital Signs: BP (!) 106/52 (BP Location: Left Arm, Patient Position: Sitting, Cuff Size: Normal)   Pulse (!) 53   Resp 12   Ht 5' 2.5" (1.588 m)   Wt 133 lb 3.2 oz (60.4 kg)   BMI 23.97 kg/m    Physical Exam  Constitutional: She is oriented to person, place, and time.  She appears well-developed and well-nourished.  HENT:  Head: Normocephalic and atraumatic.  Eyes: Conjunctivae and EOM are normal.  Neck: Normal range of motion.  Cardiovascular: Normal rate, regular rhythm, normal heart sounds and intact distal pulses.  Pulmonary/Chest: Effort normal and breath sounds normal.  Abdominal: Soft. Bowel sounds are normal.  Lymphadenopathy:    She has no cervical adenopathy.  Neurological: She is alert and oriented to person, place, and time.  Skin: Skin is warm and dry. Capillary refill takes less than 2 seconds.  Psychiatric: She has a normal mood and affect. Her behavior is normal.  Nursing note and vitals reviewed.    Musculoskeletal Exam: C-spine thoracic lumbar spine good range of motion.  Shoulder joints elbow joints wrist joint MCPs PIPs DIPs were in good range of motion.  She has DIP PIP thickening consistent with osteoarthritis.  Hip joints knee joints ankles MTPs PIPs DIPs were in good range of motion with no synovitis.  CDAI  Exam: CDAI Score: Not documented Patient Global Assessment: Not documented; Provider Global Assessment: Not documented Swollen: Not documented; Tender: Not documented Joint Exam   Not documented   There is currently no information documented on the homunculus. Go to the Rheumatology activity and complete the homunculus joint exam.  Investigation: No additional findings.  Imaging: Xr Lumbar Spine 2-3 Views  Result Date: 01/12/2018 No significant disc space narrowing was noted.  Facet joint arthropathy was noted.  Xr Pelvis 1-2 Views  Result Date: 01/12/2018 No SI joint to sclerosis or narrowing was noted.   Recent Labs: Lab Results  Component Value Date   WBC 4.0 01/12/2018   HGB 11.8 01/12/2018   PLT 265 01/12/2018   NA 141 01/12/2018   K 4.5 01/12/2018   CL 104 01/12/2018   CO2 31 01/12/2018   GLUCOSE 68 01/12/2018   BUN 11 01/12/2018   CREATININE 0.80 01/12/2018   BILITOT 0.3 01/12/2018   ALKPHOS 78 02/18/2017   AST 17 01/12/2018   ALT 14 01/12/2018   PROT 7.0 01/12/2018   PROT 6.5 01/12/2018   ALBUMIN 3.8 02/18/2017   CALCIUM 9.4 01/12/2018   GFRAA 89 01/12/2018  CK 64, TSH normal, SPEP normal, ESR 9, ANA 1: 80 homogeneous, ENA negative, RF negative, anti-CCP negative, vitamin D 44  Speciality Comments: No specialty comments available.  Procedures:  No procedures performed Allergies: Clindamycin/lincomycin; Doxycycline; Escitalopram oxalate; Hyoscyamine; Macrodantin; Trazodone; Trazodone and nefazodone; Trimethoprim; Levsin [hyoscyamine sulfate]; Lincomycin; Nitrofurantoin; and Nitrofurantoin macrocrystal   Assessment / Plan:     Visit Diagnoses: History of calcium pyrophosphate deposition disease (CPPD)-detailed counseling regarding CPPD was provided.  Use of NSAIDs was discussed.  Myalgia-patient had no muscle weakness on examination.  CK was normal.  Chronic SI joint pain - X-rays were unremarkable.  Primary osteoarthritis of both hands-joint protection  muscle strengthening was discussed.  Primary osteoarthritis of both knees-patient has been seeing orthopedics.  She is going to physical therapy.  DDD (degenerative disc disease), lumbar - Status post discectomy.  She has facet joint arthropathy.  She has been followed by Dr. Nelva Bush.  Other medical problems are listed as follows:  Cerebral artery occlusion with cerebral infarction [facility])  Coronary artery disease  Essential hypertension  Gastroesophageal reflux disease  Mixed hyperlipidemia  Irritable bowel syndrome  History of bladder cancer - Treated with BCG 2017  Myofascial pain -there is positive family history.  She continues to have some generalized pain and discomfort and hyperalgesia.  I have given her a prescription for physical therapy.  Good sleep hygiene and regular exercise was emphasized.  Orders: No orders of the defined types were placed in this encounter.  No orders of the defined types were placed in this encounter.   Face-to-face time spent with patient was 40 minutes. Greater than 50% of time was spent in counseling and coordination of care.  Follow-Up Instructions: Return if symptoms worsen or fail to improve, for CPPD, Osteoarthritis, DDD.   Bo Merino, MD  Note - This record has been created using Editor, commissioning.  Chart creation errors have been sought, but may not always  have been located. Such creation errors do not reflect on  the standard of medical care.

## 2018-02-02 NOTE — Telephone Encounter (Signed)
Faxed records to them for a 2nd time today

## 2018-02-10 ENCOUNTER — Encounter: Payer: Self-pay | Admitting: Rheumatology

## 2018-02-10 ENCOUNTER — Ambulatory Visit (INDEPENDENT_AMBULATORY_CARE_PROVIDER_SITE_OTHER): Payer: Medicare Other | Admitting: Rheumatology

## 2018-02-10 VITALS — BP 106/52 | HR 53 | Resp 12 | Ht 62.5 in | Wt 133.2 lb

## 2018-02-10 DIAGNOSIS — M791 Myalgia, unspecified site: Secondary | ICD-10-CM

## 2018-02-10 DIAGNOSIS — M533 Sacrococcygeal disorders, not elsewhere classified: Secondary | ICD-10-CM

## 2018-02-10 DIAGNOSIS — Z8739 Personal history of other diseases of the musculoskeletal system and connective tissue: Secondary | ICD-10-CM

## 2018-02-10 DIAGNOSIS — E782 Mixed hyperlipidemia: Secondary | ICD-10-CM

## 2018-02-10 DIAGNOSIS — M7918 Myalgia, other site: Secondary | ICD-10-CM

## 2018-02-10 DIAGNOSIS — M5136 Other intervertebral disc degeneration, lumbar region: Secondary | ICD-10-CM | POA: Diagnosis not present

## 2018-02-10 DIAGNOSIS — K589 Irritable bowel syndrome without diarrhea: Secondary | ICD-10-CM | POA: Diagnosis not present

## 2018-02-10 DIAGNOSIS — I25119 Atherosclerotic heart disease of native coronary artery with unspecified angina pectoris: Secondary | ICD-10-CM | POA: Diagnosis not present

## 2018-02-10 DIAGNOSIS — G8929 Other chronic pain: Secondary | ICD-10-CM

## 2018-02-10 DIAGNOSIS — I10 Essential (primary) hypertension: Secondary | ICD-10-CM

## 2018-02-10 DIAGNOSIS — M19041 Primary osteoarthritis, right hand: Secondary | ICD-10-CM

## 2018-02-10 DIAGNOSIS — Z8551 Personal history of malignant neoplasm of bladder: Secondary | ICD-10-CM

## 2018-02-10 DIAGNOSIS — I6350 Cerebral infarction due to unspecified occlusion or stenosis of unspecified cerebral artery: Secondary | ICD-10-CM | POA: Diagnosis not present

## 2018-02-10 DIAGNOSIS — M170 Bilateral primary osteoarthritis of knee: Secondary | ICD-10-CM

## 2018-02-10 DIAGNOSIS — K219 Gastro-esophageal reflux disease without esophagitis: Secondary | ICD-10-CM

## 2018-02-10 DIAGNOSIS — M19042 Primary osteoarthritis, left hand: Secondary | ICD-10-CM

## 2018-02-10 NOTE — Patient Instructions (Signed)
Natural anti-inflammatories  You can purchase these at [employer], [employer] or online.  . Turmeric (capsules)  . Ginger (ginger root or capsules)  . Omega 3 (Fish, flax seeds, chia seeds, walnuts, almonds)  . Tart cherry (dried or extract)   Patient should be under the care of a physician while taking these supplements. This may not be reproduced without the permission of Dr. Bo Merino.  Iliotibial Band Syndrome Rehab Ask your health care provider which exercises are safe for you. Do exercises exactly as told by your health care provider and adjust them as directed. It is normal to feel mild stretching, pulling, tightness, or discomfort as you do these exercises, but you should stop right away if you feel sudden pain or your pain gets worse.Do not begin these exercises until told by your health care provider. Stretching and range of motion exercises These exercises warm up your muscles and joints and improve the movement and flexibility of your hip and pelvis. Exercise A: Quadriceps, prone  1. Lie on your abdomen on a firm surface, such as a bed or padded floor. 2. Bend your left / right knee and hold your ankle. If you cannot reach your ankle or pant leg, loop a belt around your foot and grab the belt instead. 3. Gently pull your heel toward your buttocks. Your knee should not slide out to the side. You should feel a stretch in the front of your thigh and knee. 4. Hold this position for __________ seconds. Repeat __________ times. Complete this stretch __________ times a day. Exercise B: Iliotibial band  1. Lie on your side with your left / right leg in the top position. 2. Bend both of your knees and grab your left / right ankle. Stretch out your bottom arm to help you balance. 3. Slowly bring your top knee back so your thigh goes behind your trunk. 4. Slowly lower your top leg toward the floor until you feel a gentle stretch on the outside of your left / right hip and  thigh. If you do not feel a stretch and your knee will not fall farther, place the heel of your other foot on top of your knee and pull your knee down toward the floor with your foot. 5. Hold this position for __________ seconds. Repeat __________ times. Complete this stretch __________ times a day. Strengthening exercises These exercises build strength and endurance in your hip and pelvis. Endurance is the ability to use your muscles for a long time, even after they get tired. Exercise C: Straight leg raises ( hip abductors) 1. Lie on your side with your left / right leg in the top position. Lie so your head, shoulder, knee, and hip line up. You may bend your bottom knee to help you balance. 2. Roll your hips slightly forward so your hips are stacked directly over each other and your left / right knee is facing forward. 3. Tense the muscles in your outer thigh and lift your top leg 4-6 inches (10-15 cm). 4. Hold this position for __________ seconds. 5. Slowly return to the starting position. Let your muscles relax completely before doing another repetition. Repeat __________ times. Complete this exercise __________ times a day. Exercise D: Straight leg raises ( hip extensors) 1. Lie on your abdomen on your bed or a firm surface. You can put a pillow under your hips if that is more comfortable. 2. Bend your left / right knee so your foot is straight up in the air.  3. Squeeze your buttock muscles and lift your left / right thigh off the bed. Do not let your back arch. 4. Tense this muscle as hard as you can without increasing any knee pain. 5. Hold this position for __________ seconds. 6. Slowly lower your leg to the starting position and allow it to relax completely. Repeat __________ times. Complete this exercise __________ times a day. Exercise E: Hip hike 1. Stand sideways on a bottom step. Stand on your left / right leg with your other foot unsupported next to the step. You can hold onto  the railing or wall if needed for balance. 2. Keep your knees straight and your torso square. Then, lift your left / right hip up toward the ceiling. 3. Slowly let your left / right hip lower toward the floor, past the starting position. Your foot should get closer to the floor. Do not lean or bend your knees. Repeat __________ times. Complete this exercise __________ times a day. This information is not intended to replace advice given to you by your health care provider. Make sure you discuss any questions you have with your health care provider. Document Released: 05/27/2005 Document Revised: 01/30/2016 Document Reviewed: 04/28/2015 Elsevier Interactive Patient Education  [employer].

## 2018-02-11 ENCOUNTER — Telehealth: Payer: Self-pay | Admitting: Rheumatology

## 2018-02-11 NOTE — Telephone Encounter (Signed)
attempted to call patient and left message on machine for patient to call the office.

## 2018-02-11 NOTE — Telephone Encounter (Signed)
Patient called stating at the end of her appointment Dr. Estanislado Pandy mentioned a prescription for an anti-inflammtory medication.  Patient requested a return call.

## 2018-02-12 NOTE — Telephone Encounter (Signed)
Please call in Mobic 7.5 mg p.o. daily total 30 with 2 refills.  Please notify patient that it causes increased risk of reflux, GI bleed, elevation of liver function kidney function.

## 2018-02-12 NOTE — Telephone Encounter (Signed)
Advised patient of the natural anti-inflammatory's mentioned in the note and patient states a prescription anti-inflammatory was mentioned. Patient states she is desperate for relief. Any recommendations?

## 2018-02-12 NOTE — Telephone Encounter (Signed)
Attempted to contact patient and left message on machine for patient to call the office.  

## 2018-02-13 ENCOUNTER — Telehealth: Payer: Self-pay | Admitting: Rheumatology

## 2018-02-13 MED ORDER — MELOXICAM 7.5 MG PO TABS: 7.5000 mg | ORAL_TABLET | Freq: Every day | ORAL | 0 refills | Status: DC

## 2018-02-13 NOTE — Telephone Encounter (Signed)
See previous phone note. Prescription sent to pharmacy.

## 2018-02-13 NOTE — Addendum Note (Signed)
Addended by: Carole Binning on: 02/13/2018 08:48 AM   Modules accepted: Orders

## 2018-02-13 NOTE — Telephone Encounter (Signed)
Please call patient

## 2018-02-13 NOTE — Telephone Encounter (Signed)
Patient left a voicemail stating she was returning Marissa's call.   

## 2018-02-18 DIAGNOSIS — M545 Low back pain: Secondary | ICD-10-CM | POA: Diagnosis not present

## 2018-02-18 DIAGNOSIS — M961 Postlaminectomy syndrome, not elsewhere classified: Secondary | ICD-10-CM | POA: Diagnosis not present

## 2018-02-24 DIAGNOSIS — N3010 Interstitial cystitis (chronic) without hematuria: Secondary | ICD-10-CM | POA: Diagnosis not present

## 2018-02-24 DIAGNOSIS — C674 Malignant neoplasm of posterior wall of bladder: Secondary | ICD-10-CM | POA: Diagnosis not present

## 2018-03-03 DIAGNOSIS — M545 Low back pain: Secondary | ICD-10-CM | POA: Diagnosis not present

## 2018-03-09 DIAGNOSIS — M545 Low back pain: Secondary | ICD-10-CM | POA: Diagnosis not present

## 2018-03-10 DIAGNOSIS — N819 Female genital prolapse, unspecified: Secondary | ICD-10-CM | POA: Diagnosis not present

## 2018-03-10 DIAGNOSIS — N3941 Urge incontinence: Secondary | ICD-10-CM | POA: Diagnosis not present

## 2018-03-10 DIAGNOSIS — N952 Postmenopausal atrophic vaginitis: Secondary | ICD-10-CM | POA: Diagnosis not present

## 2018-03-17 DIAGNOSIS — M961 Postlaminectomy syndrome, not elsewhere classified: Secondary | ICD-10-CM | POA: Diagnosis not present

## 2018-04-07 DIAGNOSIS — Z23 Encounter for immunization: Secondary | ICD-10-CM | POA: Diagnosis not present

## 2018-04-09 DIAGNOSIS — M5136 Other intervertebral disc degeneration, lumbar region: Secondary | ICD-10-CM | POA: Diagnosis not present

## 2018-04-09 DIAGNOSIS — M961 Postlaminectomy syndrome, not elsewhere classified: Secondary | ICD-10-CM | POA: Diagnosis not present

## 2018-04-13 DIAGNOSIS — M545 Low back pain: Secondary | ICD-10-CM | POA: Diagnosis not present

## 2018-04-21 DIAGNOSIS — M5136 Other intervertebral disc degeneration, lumbar region: Secondary | ICD-10-CM | POA: Diagnosis not present

## 2018-04-21 DIAGNOSIS — M961 Postlaminectomy syndrome, not elsewhere classified: Secondary | ICD-10-CM | POA: Diagnosis not present

## 2018-04-21 DIAGNOSIS — M545 Low back pain: Secondary | ICD-10-CM | POA: Diagnosis not present

## 2018-04-21 DIAGNOSIS — M47816 Spondylosis without myelopathy or radiculopathy, lumbar region: Secondary | ICD-10-CM | POA: Diagnosis not present

## 2018-04-26 ENCOUNTER — Encounter: Payer: Self-pay | Admitting: Rheumatology

## 2018-04-27 DIAGNOSIS — Z23 Encounter for immunization: Secondary | ICD-10-CM | POA: Diagnosis not present

## 2018-04-29 ENCOUNTER — Other Ambulatory Visit: Payer: Self-pay | Admitting: Gastroenterology

## 2018-04-30 ENCOUNTER — Telehealth: Payer: Self-pay | Admitting: *Deleted

## 2018-04-30 NOTE — Telephone Encounter (Signed)
Advised I did send a refill for the Bentyl. I also made her an appointment for 06-18-2018 at 10:15 am with Dr. Silverio Decamp. Patient informed.

## 2018-05-01 DIAGNOSIS — A6000 Herpesviral infection of urogenital system, unspecified: Secondary | ICD-10-CM | POA: Diagnosis not present

## 2018-05-01 DIAGNOSIS — N952 Postmenopausal atrophic vaginitis: Secondary | ICD-10-CM | POA: Diagnosis not present

## 2018-05-01 DIAGNOSIS — E7800 Pure hypercholesterolemia, unspecified: Secondary | ICD-10-CM | POA: Diagnosis not present

## 2018-05-01 DIAGNOSIS — C689 Malignant neoplasm of urinary organ, unspecified: Secondary | ICD-10-CM | POA: Diagnosis not present

## 2018-05-01 DIAGNOSIS — K219 Gastro-esophageal reflux disease without esophagitis: Secondary | ICD-10-CM | POA: Diagnosis not present

## 2018-05-01 DIAGNOSIS — Z0000 Encounter for general adult medical examination without abnormal findings: Secondary | ICD-10-CM | POA: Diagnosis not present

## 2018-05-01 DIAGNOSIS — K594 Anal spasm: Secondary | ICD-10-CM | POA: Diagnosis not present

## 2018-05-01 DIAGNOSIS — Z1389 Encounter for screening for other disorder: Secondary | ICD-10-CM | POA: Diagnosis not present

## 2018-05-01 DIAGNOSIS — E785 Hyperlipidemia, unspecified: Secondary | ICD-10-CM | POA: Diagnosis not present

## 2018-05-01 DIAGNOSIS — I2510 Atherosclerotic heart disease of native coronary artery without angina pectoris: Secondary | ICD-10-CM | POA: Diagnosis not present

## 2018-05-01 DIAGNOSIS — I5181 Takotsubo syndrome: Secondary | ICD-10-CM | POA: Diagnosis not present

## 2018-05-14 DIAGNOSIS — M79641 Pain in right hand: Secondary | ICD-10-CM | POA: Diagnosis not present

## 2018-05-14 DIAGNOSIS — M13849 Other specified arthritis, unspecified hand: Secondary | ICD-10-CM | POA: Diagnosis not present

## 2018-05-14 DIAGNOSIS — M65341 Trigger finger, right ring finger: Secondary | ICD-10-CM | POA: Diagnosis not present

## 2018-05-22 DIAGNOSIS — M47816 Spondylosis without myelopathy or radiculopathy, lumbar region: Secondary | ICD-10-CM | POA: Diagnosis not present

## 2018-06-09 ENCOUNTER — Telehealth: Payer: Self-pay | Admitting: Interventional Cardiology

## 2018-06-09 DIAGNOSIS — E7849 Other hyperlipidemia: Secondary | ICD-10-CM

## 2018-06-09 DIAGNOSIS — I2510 Atherosclerotic heart disease of native coronary artery without angina pectoris: Secondary | ICD-10-CM

## 2018-06-09 NOTE — Telephone Encounter (Signed)
New Message    Pt c/o medication issue:  1. Name of Medication: Atorvastatin 40mg   2. How are you currently taking this medication (dosage and times per day)? 40mg  once per day   3. Are you having a reaction (difficulty breathing--STAT)? Make patient's stomach upset   4. What is your medication issue? Patient states she had labs but didn't fast and her LDL's is 57 and Dr. Fara Olden want's her to double up on her atorvastatin and patient doesn't want to and needs to speak to nurse about issue.

## 2018-06-09 NOTE — Telephone Encounter (Signed)
Spoke with patient who states she had lab work done by PCP on November 22 and her LDL was 79 but she did not fast. States Dr. Fara Olden advised her to increase Atorvastatin to 80 mg (she had decreased to 40 mg) to get LDL < 70 mg/dL . She asks if Dr. Tamala Julian would have a different recommendation. I advised that his recommendation would be the same for LDL < 70 due to her hx of CAD but there are other options in regards to medications. She requests recheck of lab work and medication recommendation by Dr. Tamala Julian. I advised that her insurance company may reject the claim for a recheck too soon and she states she is aware. I scheduled her for lab appointment on 1/8 and advised that if Dr. Tamala Julian has additional recommendations our office will call her back. She verbalized understanding and agreement and thanked me for the call.

## 2018-06-10 NOTE — Telephone Encounter (Signed)
We need LDL less than 70.  In her situation we should be shooting for less than 50 which will be the new guideline target in the future.  I agree with high intensity statin therapy, atorvastatin 80 mg daily.  Another alternative would be 40 mg of atorvastatin and adding ezetimibe 10 mg/day.

## 2018-06-11 MED ORDER — ATORVASTATIN CALCIUM 40 MG PO TABS: 40.0000 mg | ORAL_TABLET | Freq: Every day | ORAL | 3 refills | Status: DC

## 2018-06-11 MED ORDER — EZETIMIBE 10 MG PO TABS: 10.0000 mg | ORAL_TABLET | Freq: Every day | ORAL | 3 refills | Status: DC

## 2018-06-11 NOTE — Telephone Encounter (Signed)
Spoke with pt and went over Dr. Thompson Caul recommendations.  Pt prefers to take Atorvastatin 40 and Zetia 10.  Atorvastatin 80 makes her sick to her stomach.  Sent both medications to [facility] per pt's request.  Pt thanked me for the call.

## 2018-06-17 ENCOUNTER — Other Ambulatory Visit: Payer: Medicare Other

## 2018-06-17 ENCOUNTER — Other Ambulatory Visit: Payer: Self-pay | Admitting: Interventional Cardiology

## 2018-06-18 ENCOUNTER — Encounter: Payer: Self-pay | Admitting: Gastroenterology

## 2018-06-18 ENCOUNTER — Ambulatory Visit (INDEPENDENT_AMBULATORY_CARE_PROVIDER_SITE_OTHER): Payer: Medicare Other | Admitting: Gastroenterology

## 2018-06-18 VITALS — BP 124/60 | HR 70 | Ht 62.0 in | Wt 127.6 lb

## 2018-06-18 DIAGNOSIS — R140 Abdominal distension (gaseous): Secondary | ICD-10-CM

## 2018-06-18 DIAGNOSIS — K580 Irritable bowel syndrome with diarrhea: Secondary | ICD-10-CM | POA: Diagnosis not present

## 2018-06-18 DIAGNOSIS — R1084 Generalized abdominal pain: Secondary | ICD-10-CM

## 2018-06-18 DIAGNOSIS — K219 Gastro-esophageal reflux disease without esophagitis: Secondary | ICD-10-CM | POA: Diagnosis not present

## 2018-06-18 NOTE — Progress Notes (Signed)
[name]    [id number]    [birth year]  Primary Care Physician:Varadarajan, [name], MD  Referring Physician: Leeroy Cha, MD [address] [place], [place] [address]  Chief complaint: IBS, bloating, abdominal cramping  HPI: 68 year old female here for follow-up visit for IBS symptoms.  She was last seen in office November 25, 2017 Her symptoms significantly improved when she was eating mostly meat and potatoes, comfort foods during holidays.  Since she started eating Brussels sprouts, broccoli, more grains and vegetables she is having excessive bloating, abdominal pain and also semi-formed stool.  She felt better when she was doing intermittent fasting but she could not last longer as she was craving food. Denies any rectal bleeding, loss of appetite, weight loss, vomiting, dysphagia or melena. She started taking digestive enzymes and feels her symptoms are somewhat better when she takes it  Flexible sigmoidoscopy January 22, 2016 for persistent anorectal and pelvic pain showed internal hemorrhoids otherwise normal Colonoscopy April 07, 2013 showed internal hemorrhoids otherwise normal exam  Outpatient Encounter Medications as of 06/18/2018  Medication Sig  . acyclovir (ZOVIRAX) 400 MG tablet Take 400 mg by mouth 2 (two) times daily.  . AMBULATORY NON FORMULARY MEDICATION as needed. Medication Name: enzyme as needed  . aspirin 81 MG tablet Take 81 mg by mouth at bedtime.   Marland Kitchen atorvastatin (LIPITOR) 40 MG tablet Take 1 tablet (40 mg total) by mouth daily.  Marland Kitchen BYSTOLIC 2.5 MG tablet TAKE 1 TABLET BY MOUTH EVERY DAY  . Cholecalciferol (VITAMIN D) 1000 UNITS capsule Take 1,000 Units by mouth daily.  Marland Kitchen dicyclomine (BENTYL) 10 MG capsule Take 1 capsule (10 mg total) by mouth 2 (two) times daily.  . Digestive Enzymes (MULTI-ENZYME) TABS Take 1 tablet by mouth as needed.  . diltiazem 2 % GEL Apply 1 application topically as needed. For rectal spasm  .  esomeprazole (NEXIUM) 40 MG capsule Take 1 capsule (40 mg total) by mouth daily.  Marland Kitchen estradiol (CLIMARA - DOSED IN MG/24 HR) 0.025 mg/24hr patch Place 0.025 mg onto the skin once a week.  . ezetimibe (ZETIA) 10 MG tablet Take 1 tablet (10 mg total) by mouth daily.  Marland Kitchen ibuprofen (ADVIL,MOTRIN) 200 MG tablet Take 600 mg by mouth every 6 (six) hours as needed for headache or moderate pain.  . meloxicam (MOBIC) 7.5 MG tablet Take 1 tablet (7.5 mg total) by mouth daily.  . Misc Natural Products (TART CHERRY ADVANCED PO) Take by mouth daily.  . nitroGLYCERIN (NITROSTAT) 0.4 MG SL tablet place 1 tablet under the tongue if needed every 5 minutes for chest pain for 3 doses IF NO RELIEF AFTER 3RD DOSE CALL PRESCRIBER OR 911.  Marland Kitchen oxyCODONE (OXY IR/ROXICODONE) 5 MG immediate release tablet Take 5 mg by mouth as needed for moderate pain.   . Probiotic Product (PROBIOTIC ACIDOPHILUS) CAPS Take 1 capsule by mouth daily.  . QUEtiapine (SEROQUEL) 25 MG tablet Take 25 mg by mouth at bedtime. For sleep  (pt takes seroquel OR temazepam for sleep)  . ranolazine (RANEXA) 500 MG 12 hr tablet Take 1 tablet (500 mg total) by mouth 2 (two) times daily.  Marland Kitchen Peppermint Oil (IBGARD) 90 MG CPCR Take 2 capsules by mouth as needed.  . [DISCONTINUED] Ginger, Zingiber officinalis, (GINGER ROOT PO) Take by mouth daily.  . [DISCONTINUED] LORazepam (ATIVAN) 0.5 MG tablet Take 0.5 mg by mouth 3 (three) times daily as needed (Espohegeal spasms).  . [DISCONTINUED]  temazepam (RESTORIL) 15 MG capsule Take 15 mg by mouth See admin instructions. Approximately twice weekly for sleep instead of seroquel   No facility-administered encounter medications on file as of 06/18/2018.     Allergies as of 06/18/2018 - Review Complete 06/18/2018  Allergen Reaction Noted  . Clindamycin/lincomycin Other (See Comments) 10/05/2011  . Doxycycline Nausea Only and Nausea And Vomiting 10/05/2011  . Escitalopram oxalate Nausea Only 10/05/2011  . Hyoscyamine  Itching   . Macrodantin Other (See Comments) 10/05/2011  . Trazodone Nausea Only 10/05/2011  . Trazodone and nefazodone Nausea Only 10/05/2011  . Trimethoprim Other (See Comments) 12/09/2013  . Levsin [hyoscyamine sulfate] Hives, Swelling, Anxiety, and Photosensitivity 04/29/2012  . Lincomycin Rash 10/05/2011  . Nitrofurantoin Rash 10/05/2011  . Nitrofurantoin macrocrystal Anxiety     Past Medical History:  Diagnosis Date  . Anxiety    Psychiatry Dr. Stan Head, concerns for memory loss, anxiety  . Bladder tumor   . Broken heart syndrome   . Cancer ([place])    bladder CA  . Chronic chest pain   . Coronary atherosclerosis CARDIOLOGIST-  DR Daneen Schick   Cath 11/2006 demonstrating moderate obstructive coronary disease with 70-80% septal perforator #1, 50-70% first diagonal, 80% third diagonal, 50% mid and distal LAD & heavy calcification throughout all 3 coronaries. LVF normal.  . Esophageal motility disorder   . Essential hypertension, benign   . Fatty liver 04/28/12  . GERD (gastroesophageal reflux disease)   . H/O esophageal spasm   . Heart murmur   . History of esophageal spasm    takes ranexa  . History of TIA (transient ischemic attack)    2011--  no residuals  . Hyperlipidemia   . IBS (irritable bowel syndrome)    Diarrhea predominent, esophageal spasm severe, GERD - Dr. Elicia Lamp  . Interstitial cystitis    Dr. Gaynelle Arabian  . Memory loss    Psychiatry Dr. Stan Head, concerns for memory loss, anxiety  . PONV (postoperative nausea and vomiting)    AND URINARY RETENTION  . Sensation of pressure in bladder area   . TMJ (temporomandibular joint syndrome)   . Wears glasses     Past Surgical History:  Procedure Laterality Date  . ABDOMINAL HYSTERECTOMY  AGE 53 -- 1989  . APPENDECTOMY  1971   AND OVARIAN CYSTECTOMY  . BENIGN EXCISIONAL BREAST BX  1996  . BREAST EXCISIONAL BIOPSY Right 1992  . CARDIAC CATHETERIZATION  11-28-2006   DR Daneen Schick    moderate cad/   70-80% septal perforator #1, 50-70% first diagonal, 80% third diagonal, 50% mid and distal LAD & heavy calcification throughout all 3 coronaries. LVF normal.  . CARDIAC CATHETERIZATION  03-26-2012   DR Daneen Schick   patent CFX,  RCA,  & pLAD/  mLAD >50% to <70% a focal eccentric region that overlaps the third diagonal/  90% diagonal ostial and unchanged from prior study /   ef 65%  . CYSTO WITH HYDRODISTENSION N/A 01/14/2014   Procedure: CYSTOSCOPY/HYDRODISTENSION/INSERTION OF MARCAINE AND PYRIDUIM INTO BLADDER/INJECTION OF MARCAINE AND KENALOG into sub trigone space ;  Surgeon: Ailene Rud, MD;  Location: [facility];  Service: Urology;  Laterality: N/A;  . CYSTO/  HYDRODISTENTION/  INSTILLATION THERAPY  03-12-2010  . ESOPHAGOGASTRODUODENOSCOPY  04/30/2012   Procedure: ESOPHAGOGASTRODUODENOSCOPY (EGD);  Surgeon: Lafayette Dragon, MD;  Location: [facility];  Service: Endoscopy;  Laterality: N/A;  . LEFT HEART CATH AND CORONARY ANGIOGRAPHY N/A 02/18/2017   Procedure: LEFT HEART CATH AND  CORONARY ANGIOGRAPHY;  Surgeon: Belva Crome, MD;  Location: [facility];  Service: Cardiovascular;  Laterality: N/A;  . LUMBAR [place]  . NEGATIVE SLEEP STUDY  2010  . SHOULDER ARTHROSCOPY Left 12-23-2011  . TRANSTHORACIC ECHOCARDIOGRAM  04-21-2012   GRADE I DIASTOLIC DYSFUNCTION/  EF 60-65%/  MILD MR  &  TR  . TRANSURETHRAL RESECTION OF BLADDER TUMOR Right 01/03/2015   Procedure: TRANSURETHRAL RESECTION OF BLADDER TUMOR (TURBT);  Surgeon: Carolan Clines, MD;  Location: [facility];  Service: Urology;  Laterality: Right;    Family History  Problem Relation Age of Onset  . Heart disease Mother   . Colon polyps Mother   . Alzheimer's disease Mother   . Dementia Mother   . CAD Mother   . Heart disease Father   . Diverticulosis Father   . CAD Father        in his 25s  . Heart disease Brother   . Osteoarthritis Other        Multiple family members    . Obesity Other        Multiple family members  . Depression Other        Multiple family members  . Breast cancer Sister 74       Pam  . Colon cancer Neg Hx     Social History   Socioeconomic History  . Marital status: Married    Spouse name: Not on file  . Number of children: 2  . Years of education: Not on file  . Highest education level: Not on file  Occupational History  . Occupation: caregiver  Social Needs  . Financial resource strain: Not on file  . Food insecurity:    Worry: Not on file    Inability: Not on file  . Transportation needs:    Medical: Not on file    Non-medical: Not on file  Tobacco Use  . Smoking status: Former Smoker    Years: 10.00    Types: Cigarettes    Last attempt to quit: 06/10/1973    Years since quitting: 45.0  . Smokeless tobacco: Never Used  Substance and Sexual Activity  . Alcohol use: Yes    Alcohol/week: 2.0 standard drinks    Types: 2 Glasses of wine per week    Comment: social  . Drug use: No  . Sexual activity: Yes  Lifestyle  . Physical activity:    Days per week: Not on file    Minutes per session: Not on file  . Stress: Not on file  Relationships  . Social connections:    Talks on phone: Not on file    Gets together: Not on file    Attends religious service: Not on file    Active member of club or organization: Not on file    Attends meetings of clubs or organizations: Not on file    Relationship status: Not on file  . Intimate partner violence:    Fear of current or ex partner: Not on file    Emotionally abused: Not on file    Physically abused: Not on file    Forced sexual activity: Not on file  Other Topics Concern  . Not on file  Social History Narrative  . Not on file      Review of systems: Review of Systems  Constitutional: Negative for fever and chills.  HENT: Negative.   Eyes: Negative for blurred vision.  Respiratory: Negative for cough, shortness of  breath and wheezing.   Cardiovascular:  Negative for chest pain and palpitations.  Gastrointestinal: as per HPI Genitourinary: Negative for dysuria, urgency, frequency and hematuria.  Musculoskeletal: Negative for myalgias, back pain and joint pain.  Skin: Negative for itching and rash.  Neurological: Negative for dizziness, tremors, focal weakness, seizures and loss of consciousness.  Endo/Heme/Allergies: Positive for seasonal allergies.  Psychiatric/Behavioral: Negative for depression, suicidal ideas and hallucinations.  All other systems reviewed and are negative.   Physical Exam: Vitals:   06/18/18 1028  BP: 124/60  Pulse: 70  SpO2: 97%   Body mass index is 23.34 kg/m. Gen:      No acute distress HEENT:  EOMI, sclera anicteric Neck:     No masses; no thyromegaly Lungs:    Clear to auscultation bilaterally; normal respiratory effort CV:         Regular rate and rhythm; no murmurs Abd:      + bowel sounds; soft, non-tender; no palpable masses, no distension Ext:    No edema; adequate peripheral perfusion Skin:      Warm and dry; no rash Neuro: alert and oriented x 3 Psych: normal mood and affect  Data Reviewed:  Reviewed labs, radiology imaging, old records and pertinent past GI work up   Assessment and Plan/Recommendations:  68 year old female with history of chronic GERD and irritable bowel syndrome predominant diarrhea GERD symptoms stable: Continue antireflux measures Nexium daily IBS D and bloating :advised patient to avoid raw vegetables, or cruciferous vegetables as can cause increased bloating Increase consumption of cooked vegetables in the diet Will do a trial of Zenpep 2 capsules with meals and 1 capsule with snacks for 2 days Check pancreatic fat qualitative and elastase level IBgard 1 capsule 3 times daily as needed for abdominal cramps Return in 2 to 3 months or sooner if needed 25 minutes was spent face-to-face with the patient. Greater than 50% of the time used for counseling as well as  treatment plan and follow-up. She had multiple questions which were answered to her satisfaction  K. Denzil Magnuson , MD [phone]    CC: Leeroy Cha

## 2018-06-18 NOTE — Patient Instructions (Addendum)
Take IBGard 1 capsule three times a day as needed  Take samples of Zenpep 2 by mouth with meals and 1 with snack for 2 days  Go to the basement for lab kits  today   If you are age 67 or older, your body mass index should be between 23-30. Your Body mass index is 23.34 kg/m. If this is out of the aforementioned range listed, please consider follow up with your Primary Care Provider.  If you are age 47 or younger, your body mass index should be between 19-25. Your Body mass index is 23.34 kg/m. If this is out of the aformentioned range listed, please consider follow up with your Primary Care Provider.   Thank you for choosing [facility]  Karleen Hampshire Nandigam,MD

## 2018-06-23 ENCOUNTER — Other Ambulatory Visit: Payer: Self-pay | Admitting: Internal Medicine

## 2018-06-23 ENCOUNTER — Encounter: Payer: Self-pay | Admitting: Gastroenterology

## 2018-06-23 DIAGNOSIS — Z1231 Encounter for screening mammogram for malignant neoplasm of breast: Secondary | ICD-10-CM

## 2018-07-13 DIAGNOSIS — M25562 Pain in left knee: Secondary | ICD-10-CM | POA: Diagnosis not present

## 2018-07-13 DIAGNOSIS — M2242 Chondromalacia patellae, left knee: Secondary | ICD-10-CM | POA: Diagnosis not present

## 2018-07-14 DIAGNOSIS — Z9189 Other specified personal risk factors, not elsewhere classified: Secondary | ICD-10-CM | POA: Diagnosis not present

## 2018-07-14 DIAGNOSIS — C674 Malignant neoplasm of posterior wall of bladder: Secondary | ICD-10-CM | POA: Diagnosis not present

## 2018-07-16 ENCOUNTER — Other Ambulatory Visit: Payer: Medicare Other

## 2018-07-16 DIAGNOSIS — R1084 Generalized abdominal pain: Secondary | ICD-10-CM

## 2018-07-16 DIAGNOSIS — K580 Irritable bowel syndrome with diarrhea: Secondary | ICD-10-CM

## 2018-07-21 ENCOUNTER — Ambulatory Visit
Admission: RE | Admit: 2018-07-21 | Discharge: 2018-07-21 | Disposition: A | Discharge status: Home / Self Care | Payer: Medicare Other | Source: Ambulatory Visit | Attending: Internal Medicine | Admitting: Internal Medicine

## 2018-07-21 DIAGNOSIS — Z1231 Encounter for screening mammogram for malignant neoplasm of breast: Secondary | ICD-10-CM | POA: Diagnosis not present

## 2018-07-24 LAB — SPECIMEN STATUS REPORT

## 2018-07-24 LAB — PANCREATIC ELASTASE, FECAL: Pancreatic Elastase, Fecal: >500 ug Elast./g (ref >200)

## 2018-07-28 LAB — PANCREATIC ELASTASE, FECAL: Pancreatic Elastase-1, Stool: >500 mcg/g

## 2018-07-28 LAB — FECAL FAT, QUALITATIVE: FECAL FAT, QUALITATIVE: NORMAL

## 2018-08-06 ENCOUNTER — Ambulatory Visit (INDEPENDENT_AMBULATORY_CARE_PROVIDER_SITE_OTHER): Payer: Medicare Other | Admitting: Gastroenterology

## 2018-08-06 ENCOUNTER — Encounter: Payer: Self-pay | Admitting: Gastroenterology

## 2018-08-06 VITALS — BP 120/66 | HR 60 | Ht 62.5 in | Wt 128.0 lb

## 2018-08-06 DIAGNOSIS — K219 Gastro-esophageal reflux disease without esophagitis: Secondary | ICD-10-CM | POA: Diagnosis not present

## 2018-08-06 DIAGNOSIS — K582 Mixed irritable bowel syndrome: Secondary | ICD-10-CM

## 2018-08-06 MED ORDER — NEXIUM 40 MG PO CPDR: 40.0000 mg | DELAYED_RELEASE_CAPSULE | Freq: Every day | ORAL | 3 refills | Status: DC

## 2018-08-06 NOTE — Progress Notes (Signed)
[name]    [id number]    04-Aug-1950  Primary Care Physician:Varadarajan, [name], MD  Referring Physician: Leeroy Cha, MD [address] [place], [place] [address]  Chief complaint:  IBS  HPI: 68 year old female with history of chronic irritable bowel syndrome here for follow-up visit. She continues to have alternating constipation and diarrhea especially when she changes her diet.  It is hard to follow patient as she jumps from topic to topic and reports a list of foods that she is intolerant to (raw broccoli, Brussels sprouts, raw vegetables, high-fiber protein bars) but likes to eat them Never tried pancreatic enzymes. Is taking herbal remedies, prebiotic's and digestive enzymes with some improvement Fecal fat and elastase within normal   Flexible sigmoidoscopy January 22, 2016 for persistent anorectal and pelvic pain showed internal hemorrhoids otherwise normal Colonoscopy October 29, 2014showed internal hemorrhoids otherwise normal exam  Outpatient Encounter Medications as of 08/06/2018  Medication Sig  . acyclovir (ZOVIRAX) 400 MG tablet Take 400 mg by mouth 2 (two) times daily. As needed  . AMBULATORY NON FORMULARY MEDICATION as needed. Medication Name: enzyme as needed  . aspirin 81 MG tablet Take 81 mg by mouth at bedtime.   Marland Kitchen atorvastatin (LIPITOR) 40 MG tablet Take 1 tablet (40 mg total) by mouth daily.  Marland Kitchen BYSTOLIC 2.5 MG tablet TAKE 1 TABLET BY MOUTH EVERY DAY  . Cholecalciferol (VITAMIN D) 1000 UNITS capsule Take 1,000 Units by mouth daily.  Marland Kitchen dicyclomine (BENTYL) 10 MG capsule Take 1 capsule (10 mg total) by mouth 2 (two) times daily.  . Digestive Enzymes (MULTI-ENZYME) TABS Take 1 tablet by mouth as needed.  . diltiazem 2 % GEL Apply 1 application topically as needed. For rectal spasm  . esomeprazole (NEXIUM) 40 MG capsule Take 1 capsule (40 mg total) by mouth daily.  Marland Kitchen estradiol (CLIMARA - DOSED IN MG/24 HR) 0.025 mg/24hr  patch Place 0.025 mg onto the skin once a week.  . ezetimibe (ZETIA) 10 MG tablet Take 1 tablet (10 mg total) by mouth daily.  Marland Kitchen ibuprofen (ADVIL,MOTRIN) 200 MG tablet Take 600 mg by mouth every 6 (six) hours as needed for headache or moderate pain.  . meloxicam (MOBIC) 7.5 MG tablet Take 1 tablet (7.5 mg total) by mouth daily.  . Misc Natural Products (TART CHERRY ADVANCED PO) Take by mouth daily.  . nitroGLYCERIN (NITROSTAT) 0.4 MG SL tablet place 1 tablet under the tongue if needed every 5 minutes for chest pain for 3 doses IF NO RELIEF AFTER 3RD DOSE CALL PRESCRIBER OR 911.  Marland Kitchen oxyCODONE (OXY IR/ROXICODONE) 5 MG immediate release tablet Take 5 mg by mouth as needed for moderate pain.   Marland Kitchen Peppermint Oil (IBGARD) 90 MG CPCR Take 2 capsules by mouth as needed.  . Probiotic Product (PROBIOTIC ACIDOPHILUS) CAPS Take 1 capsule by mouth daily.  . QUEtiapine (SEROQUEL) 25 MG tablet Take 25 mg by mouth at bedtime. For sleep  (pt takes seroquel OR temazepam for sleep)  . ranolazine (RANEXA) 500 MG 12 hr tablet Take 1 tablet (500 mg total) by mouth 2 (two) times daily.   No facility-administered encounter medications on file as of 08/06/2018.     Allergies as of 08/06/2018 - Review Complete 08/06/2018  Allergen Reaction Noted  . Clindamycin/lincomycin Other (See Comments) 10/05/2011  . Doxycycline Nausea Only and Nausea And Vomiting 10/05/2011  . Escitalopram oxalate Nausea Only 10/05/2011  . Hyoscyamine Itching   .  Macrodantin Other (See Comments) 10/05/2011  . Trazodone Nausea Only 10/05/2011  . Trazodone and nefazodone Nausea Only 10/05/2011  . Trimethoprim Other (See Comments) 12/09/2013  . Levsin [hyoscyamine sulfate] Hives, Swelling, Anxiety, and Photosensitivity 04/29/2012  . Lincomycin Rash 10/05/2011  . Nitrofurantoin Rash 10/05/2011  . Nitrofurantoin macrocrystal Anxiety     Past Medical History:  Diagnosis Date  . Anxiety    Psychiatry Dr. Stan Head, concerns for memory loss,  anxiety  . Bladder tumor   . Broken heart syndrome   . Cancer ([place])    bladder CA  . Chronic chest pain   . Coronary atherosclerosis CARDIOLOGIST-  DR Daneen Schick   Cath 11/2006 demonstrating moderate obstructive coronary disease with 70-80% septal perforator #1, 50-70% first diagonal, 80% third diagonal, 50% mid and distal LAD & heavy calcification throughout all 3 coronaries. LVF normal.  . Esophageal motility disorder   . Essential hypertension, benign   . Fatty liver 04/28/12  . GERD (gastroesophageal reflux disease)   . H/O esophageal spasm   . Heart murmur   . History of esophageal spasm    takes ranexa  . History of TIA (transient ischemic attack)    2011--  no residuals  . Hyperlipidemia   . IBS (irritable bowel syndrome)    Diarrhea predominent, esophageal spasm severe, GERD - Dr. Elicia Lamp  . Interstitial cystitis    Dr. Gaynelle Arabian  . Memory loss    Psychiatry Dr. Stan Head, concerns for memory loss, anxiety  . PONV (postoperative nausea and vomiting)    AND URINARY RETENTION  . Sensation of pressure in bladder area   . TMJ (temporomandibular joint syndrome)   . Wears glasses     Past Surgical History:  Procedure Laterality Date  . ABDOMINAL HYSTERECTOMY  AGE 4 -- 1989  . APPENDECTOMY  1971   AND OVARIAN CYSTECTOMY  . BENIGN EXCISIONAL BREAST BX  1996  . BREAST EXCISIONAL BIOPSY Right 1992  . CARDIAC CATHETERIZATION  11-28-2006   DR Daneen Schick    moderate cad/  70-80% septal perforator #1, 50-70% first diagonal, 80% third diagonal, 50% mid and distal LAD & heavy calcification throughout all 3 coronaries. LVF normal.  . CARDIAC CATHETERIZATION  03-26-2012   DR Daneen Schick   patent CFX,  RCA,  & pLAD/  mLAD >50% to <70% a focal eccentric region that overlaps the third diagonal/  90% diagonal ostial and unchanged from prior study /   ef 65%  . CYSTO WITH HYDRODISTENSION N/A 01/14/2014   Procedure: CYSTOSCOPY/HYDRODISTENSION/INSERTION OF MARCAINE AND PYRIDUIM INTO  BLADDER/INJECTION OF MARCAINE AND KENALOG into sub trigone space ;  Surgeon: Ailene Rud, MD;  Location: [facility];  Service: Urology;  Laterality: N/A;  . CYSTO/  HYDRODISTENTION/  INSTILLATION THERAPY  03-12-2010  . ESOPHAGOGASTRODUODENOSCOPY  04/30/2012   Procedure: ESOPHAGOGASTRODUODENOSCOPY (EGD);  Surgeon: Lafayette Dragon, MD;  Location: [facility];  Service: Endoscopy;  Laterality: N/A;  . LEFT HEART CATH AND CORONARY ANGIOGRAPHY N/A 02/18/2017   Procedure: LEFT HEART CATH AND CORONARY ANGIOGRAPHY;  Surgeon: Belva Crome, MD;  Location: [facility];  Service: Cardiovascular;  Laterality: N/A;  . LUMBAR [place]  . NEGATIVE SLEEP STUDY  2010  . SHOULDER ARTHROSCOPY Left 12-23-2011  . TRANSTHORACIC ECHOCARDIOGRAM  04-21-2012   GRADE I DIASTOLIC DYSFUNCTION/  EF 60-65%/  MILD MR  &  TR  . TRANSURETHRAL RESECTION OF BLADDER TUMOR Right 01/03/2015   Procedure: TRANSURETHRAL RESECTION OF BLADDER TUMOR (  TURBT);  Surgeon: Carolan Clines, MD;  Location: [facility];  Service: Urology;  Laterality: Right;    Family History  Problem Relation Age of Onset  . Heart disease Mother   . Colon polyps Mother   . Alzheimer's disease Mother   . Dementia Mother   . CAD Mother   . Heart disease Father   . Diverticulosis Father   . CAD Father        in his 16s  . Heart disease Brother   . Osteoarthritis Other        Multiple family members  . Obesity Other        Multiple family members  . Depression Other        Multiple family members  . Breast cancer Sister 77       Pam  . Colon cancer Neg Hx     Social History   Socioeconomic History  . Marital status: Married    Spouse name: Not on file  . Number of children: 2  . Years of education: Not on file  . Highest education level: Not on file  Occupational History  . Occupation: caregiver  Social Needs  . Financial resource strain: Not on file  . Food insecurity:    Worry:  Not on file    Inability: Not on file  . Transportation needs:    Medical: Not on file    Non-medical: Not on file  Tobacco Use  . Smoking status: Former Smoker    Years: 10.00    Types: Cigarettes    Last attempt to quit: 06/10/1973    Years since quitting: 45.1  . Smokeless tobacco: Never Used  Substance and Sexual Activity  . Alcohol use: Yes    Alcohol/week: 2.0 standard drinks    Types: 2 Glasses of wine per week    Comment: social  . Drug use: No  . Sexual activity: Yes  Lifestyle  . Physical activity:    Days per week: Not on file    Minutes per session: Not on file  . Stress: Not on file  Relationships  . Social connections:    Talks on phone: Not on file    Gets together: Not on file    Attends religious service: Not on file    Active member of club or organization: Not on file    Attends meetings of clubs or organizations: Not on file    Relationship status: Not on file  . Intimate partner violence:    Fear of current or ex partner: Not on file    Emotionally abused: Not on file    Physically abused: Not on file    Forced sexual activity: Not on file  Other Topics Concern  . Not on file  Social History Narrative  . Not on file      Review of systems: Review of Systems  Constitutional: Negative for fever and chills.  HENT: Positive for ringing in ears Eyes: Negative for blurred vision.  Respiratory: Negative for cough, shortness of breath and wheezing.   Cardiovascular: Negative for chest pain and palpitations.  Gastrointestinal: as per HPI Genitourinary: Negative for dysuria, urgency, frequency and hematuria.  Musculoskeletal: Positive for myalgias, back pain and joint pain.  Skin: Negative for itching and rash.  Neurological: Negative for dizziness, tremors, focal weakness, seizures and loss of consciousness.  Endo/Heme/Allergies: Negative for seasonal allergies.  Psychiatric/Behavioral: Negative for depression, suicidal ideas and hallucinations.   Positive for insomnia All other systems  reviewed and are negative.   Physical Exam: Vitals:   08/06/18 0936  BP: 120/66  Pulse: 60  SpO2: 99%   Body mass index is 23.04 kg/m. Gen:      No acute distress HEENT:  EOMI, sclera anicteric Neck:     No masses; no thyromegaly Lungs:    Clear to auscultation bilaterally; normal respiratory effort CV:         Regular rate and rhythm; no murmurs Abd:      + bowel sounds; soft, non-tender; no palpable masses, no distension Ext:    No edema; adequate peripheral perfusion Skin:      Warm and dry; no rash Neuro: alert and oriented x 3 Psych: normal mood and affect  Data Reviewed:  Reviewed labs, radiology imaging, old records and pertinent past GI work up   Assessment and Plan/Recommendations:  68 year old female with chronic irritable bowel syndrome with alternating diarrhea and constipation Work-up so far unrevealing for any significant pathology IBgard 1 capsule 3 times daily Small frequent meals Patient to avoid raw vegetables but okay to consume grains, cooked vegetables and fruit. Benefiber 1 teaspoon 3 times daily  GERD: Nexium 40 mg daily Antireflux measures  25 minutes was spent face-to-face with the patient. Greater than 50% of the time used for counseling as well as treatment plan and follow-up. She had multiple questions which were answered to her satisfaction  K. Denzil Magnuson , MD [phone]    CC: Leeroy Cha

## 2018-08-06 NOTE — Patient Instructions (Addendum)
Take Beneifiber 1 teaspoon three times a day as needed (Soluable fiber only)  Use Fiberchoice 1 tablet daily  Continue your pancreatic enzyme medications samples   Take IBGard 1 capsule three times a day as needed  We will send Nexium brand name only to your pharmacy   If you are age 68 or older, your body mass index should be between 23-30. Your Body mass index is 23.04 kg/m. If this is out of the aforementioned range listed, please consider follow up with your Primary Care Provider.  If you are age 31 or younger, your body mass index should be between 19-25. Your Body mass index is 23.04 kg/m. If this is out of the aformentioned range listed, please consider follow up with your Primary Care Provider.    I appreciate the  opportunity to care for you  Thank You   Harl Bowie , MD

## 2018-08-10 ENCOUNTER — Other Ambulatory Visit: Payer: Self-pay | Admitting: Interventional Cardiology

## 2018-08-10 ENCOUNTER — Telehealth: Payer: Self-pay | Admitting: Gastroenterology

## 2018-08-10 MED ORDER — ATORVASTATIN CALCIUM 40 MG PO TABS: 40.0000 mg | ORAL_TABLET | Freq: Every day | ORAL | 1 refills | Status: DC

## 2018-08-10 MED ORDER — NEBIVOLOL HCL 2.5 MG PO TABS: 2.5000 mg | ORAL_TABLET | Freq: Every day | ORAL | 1 refills | Status: DC

## 2018-08-10 NOTE — Telephone Encounter (Signed)
Pt stated that insurance requires PA for Nexium.    Phone: [phone] Fax [phone]

## 2018-08-11 NOTE — Telephone Encounter (Signed)
The number provided was incorrect and redirected me to a some kind of sex hotline..... Called patient to inform and the number was incorrect  Correct number for insurance [phone]   Will call for prior auth today   [place] prior auth done for brand name Nexium 72 hours wait time for approval   Called patient to inform

## 2018-08-12 ENCOUNTER — Telehealth: Payer: Self-pay | Admitting: Psychiatry

## 2018-08-12 NOTE — Telephone Encounter (Signed)
Need to review paper chart not seen in epic yet  

## 2018-08-12 NOTE — Telephone Encounter (Signed)
Patient called stated medication would not be refilled until she made an appointment. Scheduled appt for Mon 3/9. Requesting Seroquil be refilled at the [facility] on [address].

## 2018-08-12 NOTE — Telephone Encounter (Signed)
Patient approved for brand name Nexium until 06/10/2019   Called patient to inform Nexium was approved

## 2018-08-13 ENCOUNTER — Other Ambulatory Visit: Payer: Self-pay

## 2018-08-13 MED ORDER — QUETIAPINE FUMARATE 25 MG PO TABS: 25.0000 mg | ORAL_TABLET | Freq: Every day | ORAL | 0 refills | Status: DC

## 2018-08-13 NOTE — Telephone Encounter (Signed)
ERROR

## 2018-08-13 NOTE — Telephone Encounter (Signed)
#  30 seroquel 1 at bedtime sent to [facility] on [address].

## 2018-08-17 ENCOUNTER — Ambulatory Visit (INDEPENDENT_AMBULATORY_CARE_PROVIDER_SITE_OTHER): Payer: Medicare Other | Admitting: Psychiatry

## 2018-08-17 ENCOUNTER — Encounter: Payer: Self-pay | Admitting: Psychiatry

## 2018-08-17 DIAGNOSIS — F5101 Primary insomnia: Secondary | ICD-10-CM | POA: Diagnosis not present

## 2018-08-17 MED ORDER — QUETIAPINE FUMARATE 25 MG PO TABS: 25.0000 mg | ORAL_TABLET | Freq: Every day | ORAL | 3 refills | Status: DC

## 2018-08-17 NOTE — Progress Notes (Signed)
[name] [id number] 05-16-1951 68 y.o.  Subjective:   Patient ID:  [name] is a 69 y.o. (DOB October 08, 1950) female.  Chief Complaint: No chief complaint on file.   HPI  Last seen October 2018 [name] presents to the office today for follow-up of chronic insomnia and some anxiety.  Overall OK.  Daughter continues to be a challenge Re: health.  Son [name] is a patient here and CO not going to sleep on time.  He's 60.  Also concerned with [name]'s mental health and both his brother's have had sons who committed suicide.  1 of his B with Alzheimers and she's had chronic worry over getting Alzheimer's.    Still complains of chronic worry issues and word-finding problems.  But she has no functional impairment.  Patient reports stable mood and denies depressed or irritable moods.  Patient denies any recent difficulty with anxiety.  Patient denies difficulty with sleep initiation or maintenance as long as she takes the quetiapine.  Gets 7 hours usually.  Not drowsy daytime unless she sits to read she's tired chronically. Denies appetite disturbance.  Patient reports that energy and motivation have been good.  Patient denies any difficulty with concentration.  Patient denies any suicidal ideation.  Goes to gym.  Past Psychiatric Medication Trials: Multiple sleep med failures including temazepam, clonazepam with sedation, alprazolam with no response, Lexapro nausea, trazodone GI side effects, sertraline 25 mg with cognitive side effects, doxylamine, Belsomra no response, quetiapine good response at 25 mg she has.  She has been treated treated for anxiety as a way of trying to address the insomnia but that was not an adequate solution.  Prior neuropsychological testing 2004 because the patient had complaints of memory problems was negative for significant cognitive impairment  Review of Systems:  Review of Systems  Respiratory: Negative for shortness of breath.   Cardiovascular:  Negative for chest pain and palpitations.  Musculoskeletal: Positive for arthralgias and back pain.  Neurological: Negative for tremors and weakness.  Psychiatric/Behavioral: Positive for sleep disturbance. Negative for agitation, behavioral problems, confusion, decreased concentration, dysphoric mood, hallucinations, self-injury and suicidal ideas. The patient is not nervous/anxious and is not hyperactive.     Medications: I have reviewed the patient's current medications.  Current Outpatient Medications  Medication Sig Dispense Refill  . acyclovir (ZOVIRAX) 400 MG tablet Take 400 mg by mouth 2 (two) times daily. As needed    . AMBULATORY NON FORMULARY MEDICATION as needed. Medication Name: enzyme as needed    . aspirin 81 MG tablet Take 81 mg by mouth at bedtime.     Marland Kitchen atorvastatin (LIPITOR) 40 MG tablet Take 1 tablet (40 mg total) by mouth daily. 90 tablet 1  . Cholecalciferol (VITAMIN D) 1000 UNITS capsule Take 1,000 Units by mouth daily.    Marland Kitchen dicyclomine (BENTYL) 10 MG capsule Take 1 capsule (10 mg total) by mouth 2 (two) times daily. 60 capsule 2  . Digestive Enzymes (MULTI-ENZYME) TABS Take 1 tablet by mouth as needed.    . diltiazem 2 % GEL Apply 1 application topically as needed. For rectal spasm 30 g 1  . estradiol (CLIMARA - DOSED IN MG/24 HR) 0.025 mg/24hr patch Place 0.025 mg onto the skin once a week.    . ezetimibe (ZETIA) 10 MG tablet Take 1 tablet (10 mg total) by mouth daily. 90 tablet 3  . ibuprofen (ADVIL,MOTRIN) 200 MG tablet Take 600 mg by mouth every 6 (six) hours as needed for headache or  moderate pain.    . meloxicam (MOBIC) 7.5 MG tablet Take 1 tablet (7.5 mg total) by mouth daily. 30 tablet 0  . Misc Natural Products (TART CHERRY ADVANCED PO) Take by mouth daily.    . nebivolol (BYSTOLIC) 2.5 MG tablet Take 1 tablet (2.5 mg total) by mouth daily. 90 tablet 1  . NEXIUM 40 MG capsule Take 1 capsule (40 mg total) by mouth daily. 90 capsule 3  . nitroGLYCERIN  (NITROSTAT) 0.4 MG SL tablet place 1 tablet under the tongue if needed every 5 minutes for chest pain for 3 doses IF NO RELIEF AFTER 3RD DOSE CALL PRESCRIBER OR 911. 75 tablet 1  . oxyCODONE (OXY IR/ROXICODONE) 5 MG immediate release tablet Take 5 mg by mouth as needed for moderate pain.     Marland Kitchen Peppermint Oil (IBGARD) 90 MG CPCR Take 2 capsules by mouth as needed.    . Probiotic Product (PROBIOTIC ACIDOPHILUS) CAPS Take 1 capsule by mouth daily.    . QUEtiapine (SEROQUEL) 25 MG tablet Take 1 tablet (25 mg total) by mouth at bedtime. For sleep  (pt takes seroquel OR temazepam for sleep) 30 tablet 0  . ranolazine (RANEXA) 500 MG 12 hr tablet Take 1 tablet (500 mg total) by mouth 2 (two) times daily. 180 tablet 3   No current facility-administered medications for this visit.     Medication Side Effects: None  Allergies:  Allergies  Allergen Reactions  . Clindamycin/Lincomycin Other (See Comments)    Pt not sure of reaction   . Doxycycline Nausea Only and Nausea And Vomiting  . Escitalopram Oxalate Nausea Only  . Hyoscyamine Itching  . Macrodantin Other (See Comments)    Flushed, hot  . Trazodone Nausea Only  . Trazodone And Nefazodone Nausea Only  . Trimethoprim Other (See Comments)    unknown   . Levsin [Hyoscyamine Sulfate] Hives, Swelling, Anxiety and Photosensitivity    Vision issues.  . Lincomycin Rash    Reaction unknown  . Nitrofurantoin Rash    Flushed, hot  . Nitrofurantoin Macrocrystal Anxiety    Flushing, hot face and ears    Past Medical History:  Diagnosis Date  . Anxiety    Psychiatry Dr. Stan Head, concerns for memory loss, anxiety  . Bladder tumor   . Broken heart syndrome   . 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 Chronic chest pain   . Coronary atherosclerosis CARDIOLOGIST-  DR Daneen Schick   Cath 11/2006 demonstrating moderate obstructive coronary disease with 70-80% septal perforator #1, 50-70% first diagonal, 80% third diagonal, 50% mid and distal LAD & heavy  calcification throughout all 3 coronaries. LVF normal.  . Esophageal motility disorder   . Essential hypertension, benign   . Fatty liver 04/28/12  . GERD (gastroesophageal reflux disease)   . H/O esophageal spasm   . Heart murmur   . History of esophageal spasm    takes ranexa  . History of TIA (transient ischemic attack)    2011--  no residuals  . Hyperlipidemia   . IBS (irritable bowel syndrome)    Diarrhea predominent, esophageal spasm severe, GERD - Dr. Elicia Lamp  . Interstitial cystitis    Dr. Gaynelle Arabian  . Memory loss    Psychiatry Dr. Stan Head, concerns for memory loss, anxiety  . PONV (postoperative nausea and vomiting)    AND URINARY RETENTION  . Sensation of pressure in bladder area   . TMJ (temporomandibular joint syndrome)   . Wears glasses  Family History  Problem Relation Age of Onset  . Heart disease Mother   . Colon polyps Mother   . Alzheimer's disease Mother   . Dementia Mother   . CAD Mother   . Heart disease Father   . Diverticulosis Father   . CAD Father        in his 22s  . Heart disease Brother   . Osteoarthritis Other        Multiple family members  . Obesity Other        Multiple family members  . Depression Other        Multiple family members  . Breast cancer Sister 81       Pam  . Colon cancer Neg Hx     Social History   Socioeconomic History  . Marital status: Married    Spouse name: Not on file  . Number of children: 2  . Years of education: Not on file  . Highest education level: Not on file  Occupational History  . Occupation: caregiver  Social Needs  . Financial resource strain: Not on file  . Food insecurity:    Worry: Not on file    Inability: Not on file  . Transportation needs:    Medical: Not on file    Non-medical: Not on file  Tobacco Use  . Smoking status: Former Smoker    Years: 10.00    Types: Cigarettes    Last attempt to quit: 06/10/1973    Years since quitting: 45.2  . Smokeless tobacco: Never  Used  Substance and Sexual Activity  . Alcohol use: Yes    Alcohol/week: 2.0 standard drinks    Types: 2 Glasses of wine per week    Comment: social  . Drug use: No  . Sexual activity: Yes  Lifestyle  . Physical activity:    Days per week: Not on file    Minutes per session: Not on file  . Stress: Not on file  Relationships  . Social connections:    Talks on phone: Not on file    Gets together: Not on file    Attends religious service: Not on file    Active member of club or organization: Not on file    Attends meetings of clubs or organizations: Not on file    Relationship status: Not on file  . Intimate partner violence:    Fear of current or ex partner: Not on file    Emotionally abused: Not on file    Physically abused: Not on file    Forced sexual activity: Not on file  Other Topics Concern  . Not on file  Social History Narrative  . Not on file    Past Medical History, Surgical history, Social history, and Family history were reviewed and updated as appropriate.   Please see review of systems for further details on the patient's review from today.   Objective:   Physical Exam:  There were no vitals taken for this visit.  Physical Exam Constitutional:      General: She is not in acute distress.    Appearance: She is well-developed.  Musculoskeletal:        General: No deformity.  Neurological:     Mental Status: She is alert and oriented to person, place, and time.     Motor: No tremor.     Coordination: Coordination normal.     Gait: Gait normal.  Psychiatric:        Attention and  Perception: Attention normal. She is attentive.        Mood and Affect: Mood normal. Mood is not anxious or depressed. Affect is not labile, blunt, angry or inappropriate.        Speech: Speech normal.        Behavior: Behavior normal.        Thought Content: Thought content normal. Thought content does not include homicidal or suicidal ideation. Thought content does not  include homicidal or suicidal plan.        Cognition and Memory: Cognition normal.        Judgment: Judgment normal.     Comments: Insight is good.     Lab Review:     Component Value Date/Time   NA 141 01/12/2018 1031   NA 141 11/06/2016 1545   K 4.5 01/12/2018 1031   CL 104 01/12/2018 1031   CO2 31 01/12/2018 1031   GLUCOSE 68 01/12/2018 1031   BUN 11 01/12/2018 1031   BUN 11 11/06/2016 1545   CREATININE 0.80 01/12/2018 1031   CALCIUM 9.4 01/12/2018 1031   PROT 7.0 01/12/2018 1031   PROT 6.5 01/12/2018 1031   ALBUMIN 3.8 02/18/2017 0824   AST 17 01/12/2018 1031   ALT 14 01/12/2018 1031   ALKPHOS 78 02/18/2017 0824   BILITOT 0.3 01/12/2018 1031   GFRNONAA 77 01/12/2018 1031   GFRAA 89 01/12/2018 1031       Component Value Date/Time   WBC 4.0 01/12/2018 1031   RBC 4.08 01/12/2018 1031   HGB 11.8 01/12/2018 1031   HGB 12.1 11/06/2016 1545   HCT 37.1 01/12/2018 1031   HCT 37.8 11/06/2016 1545   PLT 265 01/12/2018 1031   PLT 281 11/06/2016 1545   MCV 90.9 01/12/2018 1031   MCV 89 11/06/2016 1545   MCH 28.9 01/12/2018 1031   MCHC 31.8 (L) 01/12/2018 1031   RDW 13.4 01/12/2018 1031   RDW 14.8 11/06/2016 1545   LYMPHSABS 1,108 01/12/2018 1031   MONOABS 0.5 02/17/2017 2244   EOSABS 88 01/12/2018 1031   BASOSABS 28 01/12/2018 1031    No results found for: POCLITH, LITHIUM   No results found for: PHENYTOIN, PHENOBARB, VALPROATE, CBMZ   .res Assessment: Plan:    Primary insomnia   Supportive therapy on dealing with family situations and worries that [name] will get Alzheimer's also and disagreement with H on variety of subjects and he's been irritable with her.  Disc using faith to strengthen herself with stressors.  Disc risk of sleep meds and risk of not taking them and having poor sleep.  Disc sleep hygiene.  We discussed the short-term risks associated with benzodiazepines including sedation and increased fall risk among others.  Discussed long-term side  effect risk including dependence, potential withdrawal symptoms, and the potential eventual dose-related risk of dementia.  Re: quetiapine low dosage off label for sleep, Discussed potential metabolic side effects associated with atypical antipsychotics, as well as potential risk for movement side effects. Advised pt to contact office if movement side effects occur.    This appt was 30 mins.  FU 1 year  Lynder Parents, MD, DFAPA  Please see After Visit Summary for patient specific instructions.  Future Appointments  Date Time Provider [place]  09/10/2018  9:15 AM [facility]    No orders of the defined types were placed in this encounter.     -------------------------------

## 2018-08-18 ENCOUNTER — Telehealth: Payer: Self-pay | Admitting: Gastroenterology

## 2018-08-18 NOTE — Telephone Encounter (Signed)
Dr Silverio Decamp how did you want patient to take her enzyme samples ? I dont think whats in her med list is correct?

## 2018-08-18 NOTE — Telephone Encounter (Signed)
Dr Silverio Decamp the patient is stating that you told her she could take them even if her labs were normal because they would help her anyway. She is wanting to know can she at least uses her samples and if yes how to take them?

## 2018-08-18 NOTE — Telephone Encounter (Signed)
She does not need pancreatic enzyme.  She was given samples in the past prior to testing but patient never took them.  She has normal fecal elastase and fecal fat.

## 2018-08-18 NOTE — Telephone Encounter (Signed)
Please see OV note from 2.27.20.  Pt wanted to know how to take her pancreatic enzyme samples.

## 2018-08-19 NOTE — Telephone Encounter (Signed)
Called patient to inform Dr Jillyn Hidden recommendations

## 2018-08-19 NOTE — Telephone Encounter (Signed)
If she wants to try pancreatic enzymes, she can do 1 capsule with every meal for 2 days and see if it improves her symptoms of IBS-D. Thanks

## 2018-09-10 ENCOUNTER — Other Ambulatory Visit: Payer: Medicare Other

## 2018-09-15 ENCOUNTER — Other Ambulatory Visit: Payer: Self-pay | Admitting: Psychiatry

## 2018-09-15 ENCOUNTER — Other Ambulatory Visit: Payer: Self-pay | Admitting: Interventional Cardiology

## 2018-09-15 MED ORDER — RANOLAZINE ER 500 MG PO TB12: 500.0000 mg | ORAL_TABLET | Freq: Two times a day (BID) | ORAL | 1 refills | Status: DC

## 2018-09-16 DIAGNOSIS — M65341 Trigger finger, right ring finger: Secondary | ICD-10-CM | POA: Diagnosis not present

## 2018-09-16 DIAGNOSIS — M79641 Pain in right hand: Secondary | ICD-10-CM | POA: Diagnosis not present

## 2018-09-30 DIAGNOSIS — M25562 Pain in left knee: Secondary | ICD-10-CM | POA: Diagnosis not present

## 2018-09-30 DIAGNOSIS — M238X2 Other internal derangements of left knee: Secondary | ICD-10-CM | POA: Diagnosis not present

## 2018-09-30 DIAGNOSIS — M2242 Chondromalacia patellae, left knee: Secondary | ICD-10-CM | POA: Diagnosis not present

## 2018-11-03 ENCOUNTER — Other Ambulatory Visit: Payer: Medicare Other

## 2018-11-03 ENCOUNTER — Other Ambulatory Visit: Payer: Self-pay

## 2018-11-03 DIAGNOSIS — E7849 Other hyperlipidemia: Secondary | ICD-10-CM

## 2018-11-03 DIAGNOSIS — I2510 Atherosclerotic heart disease of native coronary artery without angina pectoris: Secondary | ICD-10-CM

## 2018-11-03 LAB — BASIC METABOLIC PANEL
BUN/Creatinine Ratio: 13 (ref 12–28)
BUN: 11 mg/dL (ref 8–27)
CO2: 25 mmol/L (ref 20–29)
Calcium: 9 mg/dL (ref 8.7–10.3)
Chloride: 102 mmol/L (ref 96–106)
Creatinine, Ser: 0.82 mg/dL (ref 0.57–1.00)
GFR calc Af Amer: 86 mL/min/{1.73_m2} (ref >59)
GFR calc non Af Amer: 74 mL/min/{1.73_m2} (ref >59)
Glucose: 87 mg/dL (ref 65–99)
Potassium: 4.3 mmol/L (ref 3.5–5.2)
Sodium: 140 mmol/L (ref 134–144)

## 2018-11-03 LAB — HEPATIC FUNCTION PANEL
ALT: 18 IU/L (ref 0–32)
AST: 19 IU/L (ref 0–40)
Albumin: 4.4 g/dL (ref 3.8–4.8)
Alkaline Phosphatase: 89 IU/L (ref 39–117)
Bilirubin Total: 0.2 mg/dL (ref 0.0–1.2)
Bilirubin, Direct: 0.1 mg/dL (ref 0.00–0.40)
Total Protein: 6.4 g/dL (ref 6.0–8.5)

## 2018-11-03 LAB — LIPID PANEL
Chol/HDL Ratio: 2.4 ratio (ref 0.0–4.4)
Cholesterol, Total: 118 mg/dL (ref 100–199)
HDL: 50 mg/dL (ref >39)
LDL Calculated: 50 mg/dL (ref 0–99)
Triglycerides: 90 mg/dL (ref 0–149)
VLDL Cholesterol Cal: 18 mg/dL (ref 5–40)

## 2018-11-19 DIAGNOSIS — Z9189 Other specified personal risk factors, not elsewhere classified: Secondary | ICD-10-CM | POA: Diagnosis not present

## 2018-11-19 DIAGNOSIS — C674 Malignant neoplasm of posterior wall of bladder: Secondary | ICD-10-CM | POA: Diagnosis not present

## 2019-01-07 ENCOUNTER — Other Ambulatory Visit: Payer: Self-pay | Admitting: Interventional Cardiology

## 2019-01-24 NOTE — Progress Notes (Signed)
Virtual Visit via Video Note   This visit type was conducted due to national recommendations for restrictions regarding the COVID-19 Pandemic (e.g. social distancing) in an effort to limit this patient's exposure and mitigate transmission in our community.  Due to her co-morbid illnesses, this patient is at least at moderate risk for complications without adequate follow up.  This format is felt to be most appropriate for this patient at this time.  All issues noted in this document were discussed and addressed.  A limited physical exam was performed with this format.  Please refer to the patient's chart for her consent to telehealth for [facility]   Date:  01/24/2019   ID:  [name], DOB [birth year], MRN [id number]  Patient Location: Home Provider Location: Home  PCP:  Leeroy Cha, MD  Cardiologist:  No primary care provider on file.  Electrophysiologist:  None   Evaluation Performed:  Follow-Up Visit  Chief Complaint:  CAD  History of Present Illness:    [name] is a 67 y.o. female with hyperlipidemia, severe diffuse CAD, class III exertional angina, and anxiety disorder.  Feels well and no cardiac complaints.  The patient does not have symptoms concerning for COVID-19 infection (fever, chills, cough, or new shortness of breath).    Past Medical History:  Diagnosis Date  . Anxiety    Psychiatry Dr. Stan Head, concerns for memory loss, anxiety  . Bladder tumor   . Broken heart syndrome   . 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 Chronic chest pain   . Coronary atherosclerosis CARDIOLOGIST-  DR Daneen Schick   Cath 11/2006 demonstrating moderate obstructive coronary disease with 70-80% septal perforator #1, 50-70% first diagonal, 80% third diagonal, 50% mid and distal LAD & heavy calcification throughout all 3 coronaries. LVF normal.  . Esophageal motility disorder   . Essential hypertension, benign   . Fatty liver 04/28/12  . GERD (gastroesophageal  reflux disease)   . H/O esophageal spasm   . Heart murmur   . History of esophageal spasm    takes ranexa  . History of TIA (transient ischemic attack)    2011--  no residuals  . Hyperlipidemia   . IBS (irritable bowel syndrome)    Diarrhea predominent, esophageal spasm severe, GERD - Dr. Elicia Lamp  . Interstitial cystitis    Dr. Gaynelle Arabian  . Memory loss    Psychiatry Dr. Stan Head, concerns for memory loss, anxiety  . PONV (postoperative nausea and vomiting)    AND URINARY RETENTION  . Sensation of pressure in bladder area   . TMJ (temporomandibular joint syndrome)   . Wears glasses    Past Surgical History:  Procedure Laterality Date  . ABDOMINAL HYSTERECTOMY  AGE 41 -- 1989  . APPENDECTOMY  1971   AND OVARIAN CYSTECTOMY  . BENIGN EXCISIONAL BREAST BX  1996  . BREAST EXCISIONAL BIOPSY Right 1992  . CARDIAC CATHETERIZATION  11-28-2006   DR Daneen Schick    moderate cad/  70-80% septal perforator #1, 50-70% first diagonal, 80% third diagonal, 50% mid and distal LAD & heavy calcification throughout all 3 coronaries. LVF normal.  . CARDIAC CATHETERIZATION  03-26-2012   DR Daneen Schick   patent CFX,  RCA,  & pLAD/  mLAD >50% to <70% a focal eccentric region that overlaps the third diagonal/  90% diagonal ostial and unchanged from prior study /   ef 65%  . CYSTO WITH HYDRODISTENSION N/A 01/14/2014   Procedure: CYSTOSCOPY/HYDRODISTENSION/INSERTION OF  MARCAINE AND PYRIDUIM INTO BLADDER/INJECTION OF MARCAINE AND KENALOG into sub trigone space ;  Surgeon: Ailene Rud, MD;  Location: [facility];  Service: Urology;  Laterality: N/A;  . CYSTO/  HYDRODISTENTION/  INSTILLATION THERAPY  03-12-2010  . ESOPHAGOGASTRODUODENOSCOPY  04/30/2012   Procedure: ESOPHAGOGASTRODUODENOSCOPY (EGD);  Surgeon: Lafayette Dragon, MD;  Location: [facility];  Service: Endoscopy;  Laterality: N/A;  . LEFT HEART CATH AND CORONARY ANGIOGRAPHY N/A 02/18/2017   Procedure: LEFT HEART CATH AND  CORONARY ANGIOGRAPHY;  Surgeon: Belva Crome, MD;  Location: [facility];  Service: Cardiovascular;  Laterality: N/A;  . LUMBAR [place]  . NEGATIVE SLEEP STUDY  2010  . SHOULDER ARTHROSCOPY Left 12-23-2011  . TRANSTHORACIC ECHOCARDIOGRAM  04-21-2012   GRADE I DIASTOLIC DYSFUNCTION/  EF 60-65%/  MILD MR  &  TR  . TRANSURETHRAL RESECTION OF BLADDER TUMOR Right 01/03/2015   Procedure: TRANSURETHRAL RESECTION OF BLADDER TUMOR (TURBT);  Surgeon: Carolan Clines, MD;  Location[facility];  Service: Urology;  Laterality: Right;     No outpatient medications have been marked as taking for the 01/26/19 encounter (Appointment) with Belva Crome, MD.     Allergies:   Clindamycin/lincomycin, Doxycycline, Escitalopram oxalate, Hyoscyamine, Macrodantin, Trazodone, Trazodone and nefazodone, Trimethoprim, Levsin [hyoscyamine sulfate], Lincomycin, Nitrofurantoin, and Nitrofurantoin macrocrystal   Social History   Tobacco Use  . Smoking status: Former Smoker    Years: 10.00    Types: Cigarettes    Quit date: 06/10/1973    Years since quitting: 45.6  . Smokeless tobacco: Never Used  Substance Use Topics  . Alcohol use: Yes    Alcohol/week: 2.0 standard drinks    Types: 2 Glasses of wine per week    Comment: social  . Drug use: No     Family Hx: The patient's family history includes Alzheimer's disease in her mother; Breast cancer (age of onset: 65) in her sister; CAD in her father and mother; Colon polyps in her mother; Dementia in her mother; Depression in an other family member; Diverticulosis in her father; Heart disease in her brother, father, and mother; Obesity in an other family member; Osteoarthritis in an other family member. There is no history of Colon cancer.  ROS:   Please see the history of present illness.    Depression and stress related to COVID 19. All other systems reviewed and are negative.   Prior CV studies:   The following studies  were reviewed today:  None  Labs/Other Tests and Data Reviewed:    EKG:  No ECG reviewed.  Recent Labs: 11/03/2018: ALT 18; BUN 11; Creatinine, Ser 0.82; Potassium 4.3; Sodium 140   Recent Lipid Panel Lab Results  Component Value Date/Time   CHOL 118 11/03/2018 10:29 AM   TRIG 90 11/03/2018 10:29 AM   HDL 50 11/03/2018 10:29 AM   CHOLHDL 2.4 11/03/2018 10:29 AM   CHOLHDL 2.4 02/18/2017 08:24 AM   LDLCALC 50 11/03/2018 10:29 AM    Wt Readings from Last 3 Encounters:  08/06/18 128 lb (58.1 kg)  06/18/18 127 lb 9.6 oz (57.9 kg)  02/10/18 133 lb 3.2 oz (60.4 kg)     Objective:    Vital Signs:  There were no vitals taken for this visit.   VITAL SIGNS:  reviewed GEN:  no acute distress EYES:  sclerae anicteric, EOMI - Extraocular Movements Intact RESPIRATORY:  normal respiratory effort, symmetric expansion CARDIOVASCULAR:  no peripheral edema  ASSESSMENT & PLAN:  1. Coronary artery disease involving native coronary artery of native heart without angina pectoris   2. Other hyperlipidemia   3. Essential hypertension   4. Cerebral artery occlusion with cerebral infarction (HCC)   5. Palpitations   6. Memory deficit   7. Educated About Covid-19 Virus Infection    PLAN:  1. Secondary prevention coaching 2. See below. 3. Conrolled. 4. No new events 5. None 6. Persistent but unable to detect progression. 7. Washing, wearing, and waiting.  COVID-19 Education: The signs and symptoms of COVID-19 were discussed with the patient and how to seek care for testing (follow up with PCP or arrange E-visit).  The importance of social distancing was discussed today.  Time:   Today, I have spent 15 minutes with the patient with telehealth technology discussing the above problems.     Medication Adjustments/Labs and Tests Ordered: Current medicines are reviewed at length with the patient today.  Concerns regarding medicines are outlined above.   Tests Ordered: No orders of  the defined types were placed in this encounter.   Medication Changes: No orders of the defined types were placed in this encounter.   Follow Up:  In Person in 6 month(s)  Signed, Sinclair Grooms, MD  01/24/2019 4:25 PM    [facility]

## 2019-01-25 ENCOUNTER — Other Ambulatory Visit: Payer: Self-pay | Admitting: *Deleted

## 2019-01-25 ENCOUNTER — Telehealth: Payer: Self-pay | Admitting: *Deleted

## 2019-01-25 DIAGNOSIS — M65341 Trigger finger, right ring finger: Secondary | ICD-10-CM | POA: Diagnosis not present

## 2019-01-25 DIAGNOSIS — M79641 Pain in right hand: Secondary | ICD-10-CM | POA: Diagnosis not present

## 2019-01-25 DIAGNOSIS — M1812 Unilateral primary osteoarthritis of first carpometacarpal joint, left hand: Secondary | ICD-10-CM | POA: Diagnosis not present

## 2019-01-25 DIAGNOSIS — G5601 Carpal tunnel syndrome, right upper limb: Secondary | ICD-10-CM | POA: Diagnosis not present

## 2019-01-25 NOTE — Telephone Encounter (Signed)

## 2019-01-26 ENCOUNTER — Telehealth (INDEPENDENT_AMBULATORY_CARE_PROVIDER_SITE_OTHER): Payer: Medicare Other | Admitting: Interventional Cardiology

## 2019-01-26 ENCOUNTER — Encounter: Payer: Self-pay | Admitting: Interventional Cardiology

## 2019-01-26 ENCOUNTER — Other Ambulatory Visit: Payer: Self-pay

## 2019-01-26 VITALS — BP 117/65 | HR 64 | Ht 62.75 in | Wt 126.4 lb

## 2019-01-26 DIAGNOSIS — R002 Palpitations: Secondary | ICD-10-CM

## 2019-01-26 DIAGNOSIS — R413 Other amnesia: Secondary | ICD-10-CM

## 2019-01-26 DIAGNOSIS — I6350 Cerebral infarction due to unspecified occlusion or stenosis of unspecified cerebral artery: Secondary | ICD-10-CM

## 2019-01-26 DIAGNOSIS — I2510 Atherosclerotic heart disease of native coronary artery without angina pectoris: Secondary | ICD-10-CM

## 2019-01-26 DIAGNOSIS — I10 Essential (primary) hypertension: Secondary | ICD-10-CM

## 2019-01-26 DIAGNOSIS — Z7189 Other specified counseling: Secondary | ICD-10-CM

## 2019-01-26 DIAGNOSIS — E7849 Other hyperlipidemia: Secondary | ICD-10-CM

## 2019-01-26 MED ORDER — EZETIMIBE 10 MG PO TABS: 10.0000 mg | ORAL_TABLET | Freq: Every day | ORAL | 1 refills | Status: DC

## 2019-01-26 MED ORDER — RANOLAZINE ER 500 MG PO TB12: 500.0000 mg | ORAL_TABLET | Freq: Two times a day (BID) | ORAL | 1 refills | Status: DC

## 2019-01-26 MED ORDER — ATORVASTATIN CALCIUM 40 MG PO TABS: 40.0000 mg | ORAL_TABLET | Freq: Every day | ORAL | 1 refills | Status: DC

## 2019-01-26 NOTE — Patient Instructions (Signed)
Medication Instructions:  Your physician recommends that you continue on your current medications as directed. Please refer to the Current Medication list given to you today.  If you need a refill on your cardiac medications before your next appointment, please call your pharmacy.   Lab work: None If you have labs (blood work) drawn today and your tests are completely normal, you will receive your results only by: Marland Kitchen MyChart Message (if you have MyChart) OR . A paper copy in the mail If you have any lab test that is abnormal or we need to change your treatment, we will call you to review the results.  Testing/Procedures: None  Follow-Up: At [facility], you and your health needs are our priority.  As part of our continuing mission to provide you with exceptional heart care, we have created designated Provider Care Teams.  These Care Teams include your primary Cardiologist (physician) and Advanced Practice Providers (APPs -  Physician Assistants and Nurse Practitioners) who all work together to provide you with the care you need, when you need it. You will need a follow up appointment in 6-8 months.  Please call our office 2 months in advance to schedule this appointment.  You may see Dr. Tamala Julian or one of the following Advanced Practice Providers on your designated Care Team:   Truitt Merle, NP Cecilie Kicks, NP . Kathyrn Drown, NP  Any Other Special Instructions Will Be Listed Below (If Applicable).

## 2019-01-26 NOTE — Addendum Note (Signed)
Addended by: Loren Racer on: 01/26/2019 04:52 PM   Modules accepted: Orders

## 2019-02-02 ENCOUNTER — Telehealth: Payer: Self-pay | Admitting: Interventional Cardiology

## 2019-02-02 NOTE — Telephone Encounter (Signed)
The patient is calling because they have recently opened her workout facility, but she needs a note from her doctor to attend.  I informed her that you and Dr Tamala Julian are in the office tomorrow 8/26 and she is willing to come by and get a note.  Please call her with further details needed for the note.  Thank you.

## 2019-02-02 NOTE — Telephone Encounter (Signed)
  Patient needs a note from Dr Tamala Julian that says she is okay to workout. The [employer] [facility] is requiring it to allow people to come in to workout. She can pick up note.

## 2019-02-03 NOTE — Telephone Encounter (Signed)
New Message     Patient calling about letter to start working out again at the [facility] and needs it by four.

## 2019-02-05 ENCOUNTER — Encounter: Payer: Self-pay | Admitting: *Deleted

## 2019-02-05 NOTE — Telephone Encounter (Signed)
Per Dr. Tamala Julian, ok for pt to participate in workouts at the [facility].  Spoke with pt and she said that she called the [facility] on Wednesday and they gave her a number for someone that works at [employer] and coordinates with [employer]. This person was able to get her the letter that she needed.

## 2019-02-06 DIAGNOSIS — Z23 Encounter for immunization: Secondary | ICD-10-CM | POA: Diagnosis not present

## 2019-02-19 DIAGNOSIS — L821 Other seborrheic keratosis: Secondary | ICD-10-CM | POA: Diagnosis not present

## 2019-02-19 DIAGNOSIS — D2271 Melanocytic nevi of right lower limb, including hip: Secondary | ICD-10-CM | POA: Diagnosis not present

## 2019-02-19 DIAGNOSIS — L720 Epidermal cyst: Secondary | ICD-10-CM | POA: Diagnosis not present

## 2019-02-25 DIAGNOSIS — M25571 Pain in right ankle and joints of right foot: Secondary | ICD-10-CM | POA: Diagnosis not present

## 2019-02-25 DIAGNOSIS — M79671 Pain in right foot: Secondary | ICD-10-CM | POA: Diagnosis not present

## 2019-03-05 DIAGNOSIS — N390 Urinary tract infection, site not specified: Secondary | ICD-10-CM | POA: Diagnosis not present

## 2019-03-05 DIAGNOSIS — R3914 Feeling of incomplete bladder emptying: Secondary | ICD-10-CM | POA: Diagnosis not present

## 2019-03-05 DIAGNOSIS — N3010 Interstitial cystitis (chronic) without hematuria: Secondary | ICD-10-CM | POA: Diagnosis not present

## 2019-03-05 DIAGNOSIS — R350 Frequency of micturition: Secondary | ICD-10-CM | POA: Diagnosis not present

## 2019-03-15 DIAGNOSIS — G5601 Carpal tunnel syndrome, right upper limb: Secondary | ICD-10-CM | POA: Diagnosis not present

## 2019-03-15 DIAGNOSIS — M79645 Pain in left finger(s): Secondary | ICD-10-CM | POA: Diagnosis not present

## 2019-03-15 DIAGNOSIS — M1812 Unilateral primary osteoarthritis of first carpometacarpal joint, left hand: Secondary | ICD-10-CM | POA: Diagnosis not present

## 2019-03-15 DIAGNOSIS — M65341 Trigger finger, right ring finger: Secondary | ICD-10-CM | POA: Diagnosis not present

## 2019-03-17 DIAGNOSIS — N3010 Interstitial cystitis (chronic) without hematuria: Secondary | ICD-10-CM | POA: Diagnosis not present

## 2019-03-17 DIAGNOSIS — M62838 Other muscle spasm: Secondary | ICD-10-CM | POA: Diagnosis not present

## 2019-03-17 DIAGNOSIS — N3941 Urge incontinence: Secondary | ICD-10-CM | POA: Diagnosis not present

## 2019-03-17 DIAGNOSIS — H2513 Age-related nuclear cataract, bilateral: Secondary | ICD-10-CM | POA: Diagnosis not present

## 2019-03-17 DIAGNOSIS — R350 Frequency of micturition: Secondary | ICD-10-CM | POA: Diagnosis not present

## 2019-03-17 DIAGNOSIS — M6281 Muscle weakness (generalized): Secondary | ICD-10-CM | POA: Diagnosis not present

## 2019-03-24 ENCOUNTER — Other Ambulatory Visit: Payer: Self-pay | Admitting: Interventional Cardiology

## 2019-03-24 MED ORDER — EZETIMIBE 10 MG PO TABS: 10.0000 mg | ORAL_TABLET | Freq: Every day | ORAL | 2 refills | Status: DC

## 2019-03-26 DIAGNOSIS — N3010 Interstitial cystitis (chronic) without hematuria: Secondary | ICD-10-CM | POA: Diagnosis not present

## 2019-03-29 DIAGNOSIS — M6281 Muscle weakness (generalized): Secondary | ICD-10-CM | POA: Diagnosis not present

## 2019-03-29 DIAGNOSIS — K594 Anal spasm: Secondary | ICD-10-CM | POA: Diagnosis not present

## 2019-03-29 DIAGNOSIS — M62838 Other muscle spasm: Secondary | ICD-10-CM | POA: Diagnosis not present

## 2019-03-29 DIAGNOSIS — K5909 Other constipation: Secondary | ICD-10-CM | POA: Diagnosis not present

## 2019-03-30 DIAGNOSIS — N390 Urinary tract infection, site not specified: Secondary | ICD-10-CM | POA: Diagnosis not present

## 2019-03-30 DIAGNOSIS — C674 Malignant neoplasm of posterior wall of bladder: Secondary | ICD-10-CM | POA: Diagnosis not present

## 2019-04-06 DIAGNOSIS — M65341 Trigger finger, right ring finger: Secondary | ICD-10-CM | POA: Diagnosis not present

## 2019-04-06 DIAGNOSIS — G5601 Carpal tunnel syndrome, right upper limb: Secondary | ICD-10-CM | POA: Diagnosis not present

## 2019-04-10 ENCOUNTER — Other Ambulatory Visit: Payer: Self-pay | Admitting: Interventional Cardiology

## 2019-04-19 NOTE — Progress Notes (Signed)
04/19/2019 [name]:[id number] 1951/01/07   History of Present Illness: [name] is a 68 year old female with a past medical history of anxiety, hypertension, coronary artery disease with class III exertional angina, "broken heart syndrome", TIA 2011, bladder cancer, fatty liver and IBS.  She presents today for further evaluation regarding dysphagia and esophageal pain which started approximately 4 weeks ago.  She is now having noticeable heartburn for the past few weeks.  She is also having dysphagia, she describes food sits in her midesophagus after most meals.  She tries to drink water which results in burping which causes more discomfort.  Yesterday, she felt as if food was stuck to the midesophagus for about 5 hours.  However, during this time she was able to drink water.  She has noticed difficulty passing her medications and vitamins which are slower to pass down the esophagus as well.  No difficulty when swallowing only liquids.  She reports having a history of esophageal spasms.  She is taking Nexium 40 mg once daily.  She underwent an EGD 04/2012 by Dr. 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  She underwent surgery to her right hand approximately 2 weeks ago.  No postoperative complications.  She denies using any NSAIDs following her surgery.  She infrequently takes ibuprofen.  She has a history of irritable bowel syndrome.  She has taken Benefiber, pancreatic enzymes, IBgard without improvement.  She has intermittent constipation and she typically passes a soft stool once or twice daily.  Occasional loose stool.  No rectal bleeding or melena.  She underwent a flexible sigmoidoscopy January 22, 2016 for persistent anorectal and pelvic pain showed internal hemorrhoids otherwise normal.  Her most recent colonoscopy was October 29, 2014which showed internal hemorrhoids otherwise normal exam. Mother with history of colon polyps and breast cancer. No family history of  colorectal cancer.   EGD 04/30/2012 by Dr. Olevia Perches: esophagus with leukoplakia, biopsies showed benign squamous epithelium. No evidence of H. Pylori. Duodenal biopsies were not done.   Past Medical History:  Diagnosis Date   Anxiety    Psychiatry Dr. Stan Head, concerns for memory loss, anxiety   Bladder tumor    Broken heart syndrome    Cancer ([place])    bladder CA   Chronic chest pain    Coronary atherosclerosis CARDIOLOGIST-  DR Daneen Schick   Cath 11/2006 demonstrating moderate obstructive coronary disease with 70-80% septal perforator #1, 50-70% first diagonal, 80% third diagonal, 50% mid and distal LAD & heavy calcification throughout all 3 coronaries. LVF normal.   Esophageal motility disorder    Essential hypertension, benign    Fatty liver 04/28/12   GERD (gastroesophageal reflux disease)    H/O esophageal spasm    Heart murmur    History of esophageal spasm    takes ranexa   History of TIA (transient ischemic attack)    2011--  no residuals   Hyperlipidemia    IBS (irritable bowel syndrome)    Diarrhea predominent, esophageal spasm severe, GERD - Dr. Elicia Lamp   Interstitial cystitis    Dr. Gaynelle Arabian   Memory loss    Psychiatry Dr. Stan Head, concerns for memory loss, anxiety   PONV (postoperative nausea and vomiting)    AND URINARY RETENTION   Sensation of pressure in bladder area    TMJ (temporomandibular joint syndrome)    Wears glasses     Current Outpatient Medications on File Prior to Visit  Medication Sig Dispense Refill  acyclovir (ZOVIRAX) 400 MG tablet Take 400 mg by mouth as needed. As needed     aspirin 81 MG tablet Take 81 mg by mouth at bedtime.      atorvastatin (LIPITOR) 40 MG tablet TAKE 1 TABLET EVERY DAY 90 tablet 2   BYSTOLIC 2.5 MG tablet TAKE 1 TABLET EVERY DAY 90 tablet 0   cyclobenzaprine (FLEXERIL) 10 MG tablet Take 10 mg by mouth as needed.     dicyclomine (BENTYL) 10 MG capsule Take 10 mg by mouth as  needed.     Digestive Enzymes (MULTI-ENZYME) TABS Take 1 tablet by mouth as needed.     diltiazem 2 % GEL Apply 1 application topically as needed. For rectal spasm 30 g 1   estradiol (CLIMARA - DOSED IN MG/24 HR) 0.025 mg/24hr patch Place 0.025 mg onto the skin once a week.     ezetimibe (ZETIA) 10 MG tablet Take 1 tablet (10 mg total) by mouth daily. 90 tablet 2   ibuprofen (ADVIL,MOTRIN) 200 MG tablet Take 600 mg by mouth every 6 (six) hours as needed for headache or moderate pain.     Misc Natural Products (TART CHERRY ADVANCED PO) Take by mouth daily.     NEXIUM 40 MG capsule Take 1 capsule (40 mg total) by mouth daily. 90 capsule 3   nitroGLYCERIN (NITROSTAT) 0.4 MG SL tablet place 1 tablet under the tongue if needed every 5 minutes for chest pain for 3 doses IF NO RELIEF AFTER 3RD DOSE CALL PRESCRIBER OR 911. 75 tablet 1   oxyCODONE (OXY IR/ROXICODONE) 5 MG immediate release tablet Take 5 mg by mouth as needed for moderate pain.      Peppermint Oil (IBGARD) 90 MG CPCR Take 2 capsules by mouth as needed.     Probiotic Product (PROBIOTIC ACIDOPHILUS) CAPS Take 1 capsule by mouth daily.     QUEtiapine (SEROQUEL) 25 MG tablet Take 1 tablet (25 mg total) by mouth at bedtime. 90 tablet 3   ranolazine (RANEXA) 500 MG 12 hr tablet Take 1 tablet (500 mg total) by mouth 2 (two) times daily. 180 tablet 1   traMADol (ULTRAM) 50 MG tablet Take 50 mg by mouth every 6 (six) hours as needed.     No current facility-administered medications on file prior to visit.     Current Medications, Allergies, Past Medical History, Past Surgical History, Family History and Social History were reviewed in [employer] record.   Physical Exam: There were no vitals taken for this visit. General: Well developed  female in no acute distress Head: Normocephalic and atraumatic Eyes:  sclerae anicteric, conjunctiva pink  Ears: Normal auditory acuity Lungs: Clear throughout to  auscultation Heart: Regular rate and rhythm Abdomen: Soft, non tender and non distended. No masses, no hepatomegaly. Normal bowel sounds Rectal: Deferred. Musculoskeletal: Symmetrical with no gross deformities  Extremities: No edema  Neurological: Alert oriented x 4, grossly nonfocal Psychological:  Alert and cooperative. Normal mood and affect  Assessment and Recommendations:  100.  68 year old female with dysphagia, GERD with past history of esophageal spasms -EGD benefits and risk discussed including risk with sedation, risk of bleeding, perforation and infection -Twelve-lead EKG to be completed prior to EGD -Further follow-up to be determined after EGD completed   1. IBS -Celiac panel, CBC, CMP, TSH and CRP -Probiotic of choice once daily  2. Colon Cancer Screening. Family history of colon polyps (mother).  Colonoscopy October 2014, no polyps.  Flexible sigmoidoscopy in 2017 showed hemorrhoids,  no polyps. -Dr. Silverio Decamp to verify next colonoscopy due date  3. CAD, history of angina treated medically  4. Hx TIA

## 2019-04-20 ENCOUNTER — Other Ambulatory Visit: Payer: Self-pay

## 2019-04-20 ENCOUNTER — Other Ambulatory Visit (INDEPENDENT_AMBULATORY_CARE_PROVIDER_SITE_OTHER): Payer: Medicare Other

## 2019-04-20 ENCOUNTER — Encounter: Payer: Self-pay | Admitting: Nurse Practitioner

## 2019-04-20 ENCOUNTER — Ambulatory Visit (INDEPENDENT_AMBULATORY_CARE_PROVIDER_SITE_OTHER): Payer: Medicare Other | Admitting: Nurse Practitioner

## 2019-04-20 VITALS — BP 121/62 | HR 70 | Temp 96.1°F | Ht 62.0 in | Wt 127.8 lb

## 2019-04-20 DIAGNOSIS — Z8249 Family history of ischemic heart disease and other diseases of the circulatory system: Secondary | ICD-10-CM

## 2019-04-20 DIAGNOSIS — M62838 Other muscle spasm: Secondary | ICD-10-CM | POA: Diagnosis not present

## 2019-04-20 DIAGNOSIS — K589 Irritable bowel syndrome without diarrhea: Secondary | ICD-10-CM

## 2019-04-20 DIAGNOSIS — Z1159 Encounter for screening for other viral diseases: Secondary | ICD-10-CM

## 2019-04-20 DIAGNOSIS — R1310 Dysphagia, unspecified: Secondary | ICD-10-CM

## 2019-04-20 DIAGNOSIS — K5900 Constipation, unspecified: Secondary | ICD-10-CM

## 2019-04-20 DIAGNOSIS — M79641 Pain in right hand: Secondary | ICD-10-CM | POA: Diagnosis not present

## 2019-04-20 DIAGNOSIS — R079 Chest pain, unspecified: Secondary | ICD-10-CM | POA: Diagnosis not present

## 2019-04-20 DIAGNOSIS — M6281 Muscle weakness (generalized): Secondary | ICD-10-CM | POA: Diagnosis not present

## 2019-04-20 DIAGNOSIS — R0789 Other chest pain: Secondary | ICD-10-CM

## 2019-04-20 DIAGNOSIS — K228 Other specified diseases of esophagus: Secondary | ICD-10-CM

## 2019-04-20 DIAGNOSIS — K2289 Other specified disease of esophagus: Secondary | ICD-10-CM

## 2019-04-20 DIAGNOSIS — K5909 Other constipation: Secondary | ICD-10-CM | POA: Diagnosis not present

## 2019-04-20 DIAGNOSIS — R3914 Feeling of incomplete bladder emptying: Secondary | ICD-10-CM | POA: Diagnosis not present

## 2019-04-20 LAB — COMPREHENSIVE METABOLIC PANEL
ALT: 17 U/L (ref 0–35)
AST: 18 U/L (ref 0–37)
Albumin: 4.4 g/dL (ref 3.5–5.2)
Alkaline Phosphatase: 89 U/L (ref 39–117)
BUN: 14 mg/dL (ref 6–23)
CO2: 28 mEq/L (ref 19–32)
Calcium: 9 mg/dL (ref 8.4–10.5)
Chloride: 101 mEq/L (ref 96–112)
Creatinine, Ser: 0.76 mg/dL (ref 0.40–1.20)
GFR: 75.77 mL/min (ref >60.00)
Glucose, Bld: 138 mg/dL — ABNORMAL HIGH (ref 70–99)
Potassium: 3.8 mEq/L (ref 3.5–5.1)
Sodium: 137 mEq/L (ref 135–145)
Total Bilirubin: 0.3 mg/dL (ref 0.2–1.2)
Total Protein: 7 g/dL (ref 6.0–8.3)

## 2019-04-20 LAB — CBC
HCT: 36.4 % (ref 36.0–46.0)
Hemoglobin: 11.9 g/dL — ABNORMAL LOW (ref 12.0–15.0)
MCHC: 32.8 g/dL (ref 30.0–36.0)
MCV: 87.1 fl (ref 78.0–100.0)
Platelets: 307 10*3/uL (ref 150.0–400.0)
RBC: 4.18 Mil/uL (ref 3.87–5.11)
RDW: 14 % (ref 11.5–15.5)
WBC: 5 10*3/uL (ref 4.0–10.5)

## 2019-04-20 LAB — IGA: IgA: 149 mg/dL (ref 68–378)

## 2019-04-20 LAB — HIGH SENSITIVITY CRP: CRP, High Sensitivity: 0.73 mg/L (ref 0.000–5.000)

## 2019-04-20 LAB — TSH: TSH: 1.72 u[IU]/mL (ref 0.35–4.50)

## 2019-04-20 NOTE — Patient Instructions (Signed)
Your provider has requested that you go to the basement level for lab work before leaving today. Press "B" on the elevator. The lab is located at the first door on the left as you exit the elevator.  You have been scheduled for an endoscopy. Please follow written instructions given to you at your visit today. If you use inhalers (even only as needed), please bring them with you on the day of your procedure.  You have been scheduled for an EKG 04/21/2019 at 9:00am.  Please arrive at [facility]- entrance C off [facility]

## 2019-04-21 ENCOUNTER — Ambulatory Visit (HOSPITAL_COMMUNITY)
Admission: RE | Admit: 2019-04-21 | Discharge: 2019-04-21 | Disposition: A | Discharge status: Home / Self Care | Payer: Medicare Other | Source: Ambulatory Visit | Attending: Nurse Practitioner | Admitting: Nurse Practitioner

## 2019-04-21 DIAGNOSIS — K228 Other specified diseases of esophagus: Secondary | ICD-10-CM | POA: Diagnosis not present

## 2019-04-21 DIAGNOSIS — R079 Chest pain, unspecified: Secondary | ICD-10-CM | POA: Insufficient documentation

## 2019-04-21 LAB — TISSUE TRANSGLUTAMINASE, IGA: (tTG) Ab, IgA: 1 U/mL

## 2019-04-26 ENCOUNTER — Ambulatory Visit (INDEPENDENT_AMBULATORY_CARE_PROVIDER_SITE_OTHER): Payer: Medicare Other

## 2019-04-26 DIAGNOSIS — Z1159 Encounter for screening for other viral diseases: Secondary | ICD-10-CM

## 2019-04-26 DIAGNOSIS — Z03818 Encounter for observation for suspected exposure to other biological agents ruled out: Secondary | ICD-10-CM | POA: Diagnosis not present

## 2019-04-27 LAB — SARS CORONAVIRUS 2 (TAT 6-24 HRS): SARS Coronavirus 2: NEGATIVE

## 2019-04-28 ENCOUNTER — Encounter: Payer: Self-pay | Admitting: Gastroenterology

## 2019-04-28 ENCOUNTER — Other Ambulatory Visit: Payer: Self-pay

## 2019-04-28 ENCOUNTER — Encounter: Payer: Medicare Other | Admitting: Gastroenterology

## 2019-04-28 ENCOUNTER — Ambulatory Visit (AMBULATORY_SURGERY_CENTER): Payer: Medicare Other | Admitting: Gastroenterology

## 2019-04-28 VITALS — BP 133/63 | HR 58 | Temp 97.5°F | Resp 22 | Ht 62.0 in | Wt 127.0 lb

## 2019-04-28 DIAGNOSIS — I10 Essential (primary) hypertension: Secondary | ICD-10-CM | POA: Diagnosis not present

## 2019-04-28 DIAGNOSIS — I2510 Atherosclerotic heart disease of native coronary artery without angina pectoris: Secondary | ICD-10-CM | POA: Diagnosis not present

## 2019-04-28 DIAGNOSIS — K228 Other specified diseases of esophagus: Secondary | ICD-10-CM | POA: Diagnosis not present

## 2019-04-28 DIAGNOSIS — R1310 Dysphagia, unspecified: Secondary | ICD-10-CM | POA: Diagnosis not present

## 2019-04-28 DIAGNOSIS — K219 Gastro-esophageal reflux disease without esophagitis: Secondary | ICD-10-CM | POA: Diagnosis not present

## 2019-04-28 DIAGNOSIS — E785 Hyperlipidemia, unspecified: Secondary | ICD-10-CM | POA: Diagnosis not present

## 2019-04-28 DIAGNOSIS — K2289 Other specified disease of esophagus: Secondary | ICD-10-CM

## 2019-04-28 DIAGNOSIS — K2970 Gastritis, unspecified, without bleeding: Secondary | ICD-10-CM

## 2019-04-28 DIAGNOSIS — R1013 Epigastric pain: Secondary | ICD-10-CM | POA: Diagnosis not present

## 2019-04-28 MED ORDER — HYOSCYAMINE SULFATE 0.125 MG SL SUBL: 0.1250 mg | SUBLINGUAL_TABLET | Freq: Every day | SUBLINGUAL | 1 refills | Status: DC | PRN

## 2019-04-28 MED ORDER — SODIUM CHLORIDE 0.9 % IV SOLN: 500.0000 mL | Freq: Once | INTRAVENOUS | Status: DC

## 2019-04-28 MED ORDER — HYOSCYAMINE SULFATE 0.125 MG SL SUBL: 0.1250 mg | SUBLINGUAL_TABLET | SUBLINGUAL | 0 refills | Status: DC | PRN

## 2019-04-28 NOTE — Progress Notes (Signed)
Report to PACU, RN, vss, BBS= Clear.  

## 2019-04-28 NOTE — Progress Notes (Signed)
Called to room to assist during endoscopic procedure.  Patient ID and intended procedure confirmed with present staff. Received instructions for my participation in the procedure from the performing physician.  

## 2019-04-28 NOTE — Op Note (Signed)
[facility] Patient Name: [name] Procedure Date: 04/28/2019 7:52 AM MRN: [id number] Endoscopist: Mauri Pole , MD Age: 68 Referring MD:  Date of Birth: 1950-10-29 Gender: Female Account #: [id number] Procedure:                Upper GI endoscopy Indications:              Dysphagia,esophageal spasms, atypical chest pain,                            Esophageal reflux symptoms that recur despite                            appropriate therapy Medicines:                Monitored Anesthesia Care Procedure:                Pre-Anesthesia Assessment:                           - Prior to the procedure, a History and Physical                            was performed, and patient medications and                            allergies were reviewed. The patient's tolerance of                            previous anesthesia was also reviewed. The risks                            and benefits of the procedure and the sedation                            options and risks were discussed with the patient.                            All questions were answered, and informed consent                            was obtained. Prior Anticoagulants: The patient has                            taken no previous anticoagulant or antiplatelet                            agents. ASA Grade Assessment: II - A patient with                            mild systemic disease. After reviewing the risks                            and benefits, the patient was deemed in  satisfactory condition to undergo the procedure.                           After obtaining informed consent, the endoscope was                            passed under direct vision. Throughout the                            procedure, the patient's blood pressure, pulse, and                            oxygen saturations were monitored continuously. The                            Endoscope was introduced through  the mouth, and                            advanced to the second part of duodenum. The upper                            GI endoscopy was accomplished without difficulty.                            The patient tolerated the procedure well. Scope In: Scope Out: Findings:                 No endoscopic abnormality was evident in the                            esophagus to explain the patient's complaint of                            dysphagia.                           The Z-line was regular and was found 38 cm from the                            incisors.                           Patchy mild inflammation characterized by                            congestion (edema), erythema and mucus was found in                            the entire examined stomach. Biopsies were taken                            with a cold forceps for Helicobacter pylori testing.                           The examined duodenum was normal. Complications:  No immediate complications. Estimated Blood Loss:     Estimated blood loss was minimal. Impression:               - No endoscopic esophageal abnormality to explain                            patient's dysphagia.                           - Z-line regular, 38 cm from the incisors.                           - Gastritis. Biopsied.                           - Normal examined duodenum. Recommendation:           - Patient has a contact number available for                            emergencies. The signs and symptoms of potential                            delayed complications were discussed with the                            patient. Return to normal activities tomorrow.                            Written discharge instructions were provided to the                            patient.                           - Resume previous diet.                           - Continue present medications.                           - Continue Nexium                            - Levsin SL 1 tab daily as needed for esophageal                            spasms                           - Await pathology results.                           - Return to GI office in 3 months.                           - No ibuprofen, naproxen, or other non-steroidal  anti-inflammatory drugs. Mauri Pole, MD 04/28/2019 8:07:22 AM This report has been signed electronically.

## 2019-04-28 NOTE — Progress Notes (Signed)
Pt's states no medical or surgical changes since previsit or office visit.  JB - temp CW - vitals. 

## 2019-04-28 NOTE — Patient Instructions (Signed)
Please read handouts provided. Continue present medications. Continue Nexium. Levsin SL I tablet as needed for esophageal spasms. Await pathology results. Return to GI office in 3 months. No ibuprofen, naproxen, or other non-steriodal anti-inflammatory drugs.        YOU HAD AN ENDOSCOPIC PROCEDURE TODAY AT [facility]:   Refer to the procedure report that was given to you for any specific questions about what was found during the examination.  If the procedure report does not answer your questions, please call your gastroenterologist to clarify.  If you requested that your care partner not be given the details of your procedure findings, then the procedure report has been included in a sealed envelope for you to review at your convenience later.  YOU SHOULD EXPECT: Some feelings of bloating in the abdomen. Passage of more gas than usual.  Walking can help get rid of the air that was put into your GI tract during the procedure and reduce the bloating. If you had a lower endoscopy (such as a colonoscopy or flexible sigmoidoscopy) you may notice spotting of blood in your stool or on the toilet paper. If you underwent a bowel prep for your procedure, you may not have a normal bowel movement for a few days.  Please Note:  You might notice some irritation and congestion in your nose or some drainage.  This is from the oxygen used during your procedure.  There is no need for concern and it should clear up in a day or so.  SYMPTOMS TO REPORT IMMEDIATELY:     Following upper endoscopy (EGD)  Vomiting of blood or coffee ground material  New chest pain or pain under the shoulder blades  Painful or persistently difficult swallowing  New shortness of breath  Fever of 100F or higher  Black, tarry-looking stools  For urgent or emergent issues, a gastroenterologist can be reached at any hour by calling [phone].   DIET:  We do recommend a small meal at first, but then you  may proceed to your regular diet.  Drink plenty of fluids but you should avoid alcoholic beverages for 24 hours.  ACTIVITY:  You should plan to take it easy for the rest of today and you should NOT DRIVE or use heavy machinery until tomorrow (because of the sedation medicines used during the test).    FOLLOW UP: Our staff will call the number listed on your records 48-72 hours following your procedure to check on you and address any questions or concerns that you may have regarding the information given to you following your procedure. If we do not reach you, we will leave a message.  We will attempt to reach you two times.  During this call, we will ask if you have developed any symptoms of COVID 19. If you develop any symptoms (ie: fever, flu-like symptoms, shortness of breath, cough etc.) before then, please call [phone].  If you test positive for Covid 19 in the 2 weeks post procedure, please call and report this information to [place].    If any biopsies were taken you will be contacted by phone or by letter within the next 1-3 weeks.  Please call us at [phone] if you have not heard about the biopsies in 3 weeks.    SIGNATURES/CONFIDENTIALITY: You and/or your care partner have signed paperwork which will be entered into your electronic medical record.  These signatures attest to the fact that that the information above on your After Visit Summary  has been reviewed and is understood.  Full responsibility of the confidentiality of this discharge information lies with you and/or your care-partner. 

## 2019-04-30 ENCOUNTER — Telehealth: Payer: Self-pay | Admitting: *Deleted

## 2019-04-30 NOTE — Telephone Encounter (Signed)
  Follow up Call-  Call back number 04/28/2019  Post procedure Call Back phone  # [phone]  Permission to leave phone message Yes  Some recent data might be hidden     Patient questions:  Do you have a fever, pain , or abdominal swelling? No. Pain Score  0 *  Have you tolerated food without any problems? Yes.    Have you been able to return to your normal activities? Yes.    Do you have any questions about your discharge instructions: Diet   No. Medications  No. Follow up visit  No.  Do you have questions or concerns about your Care? No.  Actions: * If pain score is 4 or above: No action needed, pain <4.  1. Have you developed a fever since your procedure? no  2.   Have you had an respiratory symptoms (SOB or cough) since your procedure? no  3.   Have you tested positive for COVID 19 since your procedure no  4.   Have you had any family members/close contacts diagnosed with the COVID 19 since your procedure?  no   If yes to any of these questions please route to Joylene John, RN and Alphonsa Gin, Therapist, sports.

## 2019-05-03 DIAGNOSIS — K594 Anal spasm: Secondary | ICD-10-CM | POA: Diagnosis not present

## 2019-05-03 DIAGNOSIS — M6281 Muscle weakness (generalized): Secondary | ICD-10-CM | POA: Diagnosis not present

## 2019-05-03 DIAGNOSIS — R3914 Feeling of incomplete bladder emptying: Secondary | ICD-10-CM | POA: Diagnosis not present

## 2019-05-03 DIAGNOSIS — M62838 Other muscle spasm: Secondary | ICD-10-CM | POA: Diagnosis not present

## 2019-05-03 DIAGNOSIS — N3010 Interstitial cystitis (chronic) without hematuria: Secondary | ICD-10-CM | POA: Diagnosis not present

## 2019-05-04 ENCOUNTER — Encounter: Payer: Self-pay | Admitting: Gastroenterology

## 2019-05-10 NOTE — Progress Notes (Signed)
Reviewed and agree with documentation and assessment and plan. K. Veena Lael Wetherbee , MD   

## 2019-05-13 DIAGNOSIS — M79641 Pain in right hand: Secondary | ICD-10-CM | POA: Diagnosis not present

## 2019-05-14 DIAGNOSIS — E785 Hyperlipidemia, unspecified: Secondary | ICD-10-CM | POA: Diagnosis not present

## 2019-05-14 DIAGNOSIS — K219 Gastro-esophageal reflux disease without esophagitis: Secondary | ICD-10-CM | POA: Diagnosis not present

## 2019-05-14 DIAGNOSIS — Z0000 Encounter for general adult medical examination without abnormal findings: Secondary | ICD-10-CM | POA: Diagnosis not present

## 2019-05-14 DIAGNOSIS — I2510 Atherosclerotic heart disease of native coronary artery without angina pectoris: Secondary | ICD-10-CM | POA: Diagnosis not present

## 2019-05-14 DIAGNOSIS — Z1389 Encounter for screening for other disorder: Secondary | ICD-10-CM | POA: Diagnosis not present

## 2019-05-14 DIAGNOSIS — I5181 Takotsubo syndrome: Secondary | ICD-10-CM | POA: Diagnosis not present

## 2019-05-14 DIAGNOSIS — K594 Anal spasm: Secondary | ICD-10-CM | POA: Diagnosis not present

## 2019-05-14 DIAGNOSIS — N952 Postmenopausal atrophic vaginitis: Secondary | ICD-10-CM | POA: Diagnosis not present

## 2019-05-14 DIAGNOSIS — M797 Fibromyalgia: Secondary | ICD-10-CM | POA: Diagnosis not present

## 2019-05-18 DIAGNOSIS — R739 Hyperglycemia, unspecified: Secondary | ICD-10-CM | POA: Diagnosis not present

## 2019-05-18 DIAGNOSIS — E785 Hyperlipidemia, unspecified: Secondary | ICD-10-CM | POA: Diagnosis not present

## 2019-05-21 ENCOUNTER — Other Ambulatory Visit: Payer: Self-pay | Admitting: Gastroenterology

## 2019-05-28 ENCOUNTER — Other Ambulatory Visit: Payer: Self-pay | Admitting: Interventional Cardiology

## 2019-05-28 MED ORDER — EZETIMIBE 10 MG PO TABS: 10.0000 mg | ORAL_TABLET | Freq: Every day | ORAL | 2 refills | Status: DC

## 2019-05-28 MED ORDER — RANOLAZINE ER 500 MG PO TB12: 500.0000 mg | ORAL_TABLET | Freq: Two times a day (BID) | ORAL | 2 refills | Status: DC

## 2019-05-28 MED ORDER — NEBIVOLOL HCL 2.5 MG PO TABS: 2.5000 mg | ORAL_TABLET | Freq: Every day | ORAL | 2 refills | Status: DC

## 2019-05-28 MED ORDER — NITROGLYCERIN 0.4 MG SL SUBL: SUBLINGUAL_TABLET | SUBLINGUAL | 1 refills | Status: DC

## 2019-05-28 NOTE — Telephone Encounter (Signed)
*  STAT* If patient is at the pharmacy, call can be transferred to refill team.   1. Which medications need to be refilled? (please list name of each medication and dose if known) nitroGLYCERIN (NITROSTAT) 0.4 MG SL tablet ezetimibe (ZETIA) 10 MG tablet BYSTOLIC 2.5 MG tablet  ranolazine (RANEXA) 500 MG 12 hr tablet   2. Which pharmacy/location (including street and city if local pharmacy) is medication to be sent to? [place], [place]  3. Do they need a 30 day or 90 day supply? 90 day

## 2019-05-28 NOTE — Telephone Encounter (Signed)
Called pt back to let her know that her refills have been sent to her [facility] per her request.

## 2019-06-08 ENCOUNTER — Other Ambulatory Visit: Payer: Self-pay | Admitting: Interventional Cardiology

## 2019-06-23 DIAGNOSIS — M79645 Pain in left finger(s): Secondary | ICD-10-CM | POA: Diagnosis not present

## 2019-06-23 DIAGNOSIS — M180 Bilateral primary osteoarthritis of first carpometacarpal joints: Secondary | ICD-10-CM | POA: Diagnosis not present

## 2019-06-23 DIAGNOSIS — Z4789 Encounter for other orthopedic aftercare: Secondary | ICD-10-CM | POA: Diagnosis not present

## 2019-06-23 DIAGNOSIS — M79644 Pain in right finger(s): Secondary | ICD-10-CM | POA: Diagnosis not present

## 2019-07-19 ENCOUNTER — Ambulatory Visit: Payer: Medicare Other | Attending: Internal Medicine

## 2019-07-19 DIAGNOSIS — Z23 Encounter for immunization: Secondary | ICD-10-CM | POA: Insufficient documentation

## 2019-07-19 NOTE — Progress Notes (Signed)
   Covid-19 Vaccination Clinic  Name:  TIARRAH SHERRIN    MRN: [id number] DOB: 1951-02-15  07/19/2019  Ms. Hogeland was observed post Covid-19 immunization for 15 minutes without incidence. She was provided with Vaccine Information Sheet and instruction to access the V-Safe system.   Ms. Sybert was instructed to call 911 with any severe reactions post vaccine: Marland Kitchen Difficulty breathing  . Swelling of your face and throat  . A fast heartbeat  . A bad rash all over your body  . Dizziness and weakness    Immunizations Administered    Name Date Dose VIS Date Route   Pfizer COVID-19 Vaccine 07/19/2019  6:07 PM 0.3 mL 05/21/2019 Intramuscular   Manufacturer: [place]   Lot: [id number]   [place]: [id number]

## 2019-08-05 DIAGNOSIS — C674 Malignant neoplasm of posterior wall of bladder: Secondary | ICD-10-CM | POA: Diagnosis not present

## 2019-08-13 ENCOUNTER — Ambulatory Visit: Payer: Medicare Other | Attending: Internal Medicine

## 2019-08-13 DIAGNOSIS — Z23 Encounter for immunization: Secondary | ICD-10-CM | POA: Insufficient documentation

## 2019-08-13 NOTE — Progress Notes (Signed)
   Covid-19 Vaccination Clinic  Name:  [name]    MRN: [id number] DOB: 09-30-1950  08/13/2019  [name] was observed post Covid-19 immunization for 15 minutes without incident. She was provided with Vaccine Information Sheet and instruction to access the V-Safe system.   [name] was instructed to call 911 with any severe reactions post vaccine: Marland Kitchen Difficulty breathing  . Swelling of face and throat  . A fast heartbeat  . A bad rash all over body  . Dizziness and weakness   Immunizations Administered    Name Date Dose VIS Date Route   Pfizer COVID-19 Vaccine 08/13/2019  6:01 PM 0.3 mL 05/21/2019 Intramuscular   Manufacturer: [place]   Lot: [id number]   [place]: [id number]

## 2019-08-17 ENCOUNTER — Encounter: Payer: Self-pay | Admitting: Psychiatry

## 2019-08-17 ENCOUNTER — Other Ambulatory Visit: Payer: Self-pay

## 2019-08-17 ENCOUNTER — Ambulatory Visit (INDEPENDENT_AMBULATORY_CARE_PROVIDER_SITE_OTHER): Payer: Medicare Other | Admitting: Psychiatry

## 2019-08-17 DIAGNOSIS — F5101 Primary insomnia: Secondary | ICD-10-CM | POA: Diagnosis not present

## 2019-08-17 DIAGNOSIS — R4789 Other speech disturbances: Secondary | ICD-10-CM | POA: Diagnosis not present

## 2019-08-17 MED ORDER — QUETIAPINE FUMARATE 25 MG PO TABS: 25.0000 mg | ORAL_TABLET | Freq: Every day | ORAL | 3 refills | Status: DC

## 2019-08-17 NOTE — Progress Notes (Signed)
[name] DI:[id number] 1951-05-13 69 y.o.  Subjective:   Patient ID:  [name] is a 69 y.o. (DOB 1950-11-15) female.  Chief Complaint:  Chief Complaint  Patient presents with  . Follow-up    Medication Management  . Other    Insomnia  . Medication Refill    Quetiapine- Send to [facility] for 90 day supply.    HPI   [name] presents to the office today for follow-up of chronic insomnia and some anxiety.  Last seen March 2020.  No meds were changed.  Taking quetiapine daily. 80% of the time it is effective.    Seroquel doesn't always work.  May stay up too late and miss the window at times.  Busy.  Not morning person.  Overall OK.  Daughter continues to be a challenge Re: health.  Son [name] is a patient here and CO not going to sleep on time.  He's 48.  Also concerned with [name]'s mental health and both his brother's have had sons who committed suicide.  1 of his B with Alzheimers and she's had chronic worry over getting Alzheimer's.  [name] is good socially and exercises.  He does Bible studies and is life of the party publicly.  But she worries over his memory at home.    Still complains of chronic worry issues and more worry over word-finding problems.  But she has no functional impairment.  Patient reports stable mood and denies depressed or irritable moods.  Patient denies any recent difficulty with anxiety.  Patient denies difficulty with sleep initiation or maintenance as long as she takes the quetiapine.  Gets 7 hours usually.  Not drowsy daytime unless she sits to read she's tired chronically. Denies appetite disturbance.  Patient reports that energy and motivation have been good.  Patient denies any difficulty with concentration.  Patient denies any suicidal ideation.  Goes to gym. 4 years clean from cancer.  Past Psychiatric Medication Trials: Multiple sleep med failures including temazepam, clonazepam with sedation, alprazolam with no response, trazodone GI side  effects, doxylamine, Belsomra no response, quetiapine good response at 25 mg she has Lexapro nausea,  sertraline 25 mg with cognitive side effects,  .   She has been treated treated for anxiety as a way of trying to address the insomnia but that was not an adequate solution.  Prior neuropsychological testing 2004 because the patient had complaints of memory problems was negative for significant cognitive impairment  Review of Systems:  Review of Systems  Respiratory: Negative for shortness of breath.   Cardiovascular: Negative for chest pain and palpitations.  Musculoskeletal: Positive for arthralgias. Negative for back pain.  Neurological: Negative for tremors and weakness.  Psychiatric/Behavioral: Positive for sleep disturbance. Negative for agitation, behavioral problems, confusion, decreased concentration, dysphoric mood, hallucinations, self-injury and suicidal ideas. The patient is not nervous/anxious and is not hyperactive.     Medications: I have reviewed the patient's current medications.  Current Outpatient Medications  Medication Sig Dispense Refill  . acyclovir (ZOVIRAX) 400 MG tablet Take 400 mg by mouth as needed. As needed    . aspirin 81 MG tablet Take 81 mg by mouth at bedtime.     Marland Kitchen atorvastatin (LIPITOR) 40 MG tablet TAKE 1 TABLET EVERY DAY 90 tablet 2  . BYSTOLIC 2.5 MG tablet TAKE 1 TABLET EVERY DAY 90 tablet 2  . cyclobenzaprine (FLEXERIL) 10 MG tablet Take 10 mg by mouth as needed.    . dicyclomine (BENTYL) 10 MG capsule TAKE  1 CAPSULE(10 MG) BY MOUTH TWICE DAILY 60 capsule 1  . Digestive Enzymes (MULTI-ENZYME) TABS Take 1 tablet by mouth as needed.    . diltiazem 2 % GEL Apply 1 application topically as needed. For rectal spasm 30 g 1  . estradiol (CLIMARA - DOSED IN MG/24 HR) 0.025 mg/24hr patch Place 0.025 mg onto the skin once a week.    . ezetimibe (ZETIA) 10 MG tablet TAKE 1 TABLET (10 MG TOTAL) BY MOUTH DAILY. 90 tablet 2  . hyoscyamine (LEVSIN SL) 0.125 MG  SL tablet Place 1 tablet (0.125 mg total) under the tongue daily as needed. 30 tablet 1  . ibuprofen (ADVIL,MOTRIN) 200 MG tablet Take 600 mg by mouth every 6 (six) hours as needed for headache or moderate pain.    Marland Kitchen NEXIUM 40 MG capsule Take 1 capsule (40 mg total) by mouth daily. 90 capsule 3  . nitroGLYCERIN (NITROSTAT) 0.4 MG SL tablet place 1 tablet under the tongue if needed every 5 minutes for chest pain for 3 doses IF NO RELIEF AFTER 3RD DOSE CALL PRESCRIBER OR 911. 75 tablet 1  . oxyCODONE (OXY IR/ROXICODONE) 5 MG immediate release tablet Take 5 mg by mouth as needed for moderate pain.     Marland Kitchen Peppermint Oil (IBGARD) 90 MG CPCR Take 2 capsules by mouth as needed.    . Probiotic Product (PROBIOTIC ACIDOPHILUS) CAPS Take 1 capsule by mouth daily.    . QUEtiapine (SEROQUEL) 25 MG tablet Take 1 tablet (25 mg total) by mouth at bedtime. 90 tablet 3  . ranolazine (RANEXA) 500 MG 12 hr tablet Take 1 tablet (500 mg total) by mouth 2 (two) times daily. 180 tablet 2  . traMADol (ULTRAM) 50 MG tablet Take 50 mg by mouth every 6 (six) hours as needed.     No current facility-administered medications for this visit.    Medication Side Effects:  A little hard to get out of the bed  Allergies:  Allergies  Allergen Reactions  . Clindamycin/Lincomycin Other (See Comments)    Pt not sure of reaction   . Doxycycline Nausea Only and Nausea And Vomiting  . Escitalopram Oxalate Nausea Only  . Macrodantin Other (See Comments)    Flushed, hot  . Trazodone Nausea Only  . Trazodone And Nefazodone Nausea Only  . Trimethoprim Other (See Comments)    unknown   . Lincomycin Rash    Reaction unknown  . Nitrofurantoin Rash    Flushed, hot  . Nitrofurantoin Macrocrystal Anxiety    Flushing, hot face and ears    Past Medical History:  Diagnosis Date  . Anxiety    Psychiatry Dr. Stan Head, concerns for memory loss, anxiety  . Bladder tumor   . Broken heart syndrome   . 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 Chronic chest pain   . Coronary atherosclerosis CARDIOLOGIST-  DR Daneen Schick   Cath 11/2006 demonstrating moderate obstructive coronary disease with 70-80% septal perforator #1, 50-70% first diagonal, 80% third diagonal, 50% mid and distal LAD & heavy calcification throughout all 3 coronaries. LVF normal.  . Esophageal motility disorder   . Essential hypertension, benign   . Fatty liver 04/28/12  . GERD (gastroesophageal reflux disease)   . H/O esophageal spasm   . Heart murmur   . History of esophageal spasm    takes ranexa  . History of TIA (transient ischemic attack)    2011--  no residuals  . Hyperlipidemia   . IBS (irritable  bowel syndrome)    Diarrhea predominent, esophageal spasm severe, GERD - Dr. Elicia Lamp  . Interstitial cystitis    Dr. Gaynelle Arabian  . Memory loss    Psychiatry Dr. Stan Head, concerns for memory loss, anxiety  . PONV (postoperative nausea and vomiting)    AND URINARY RETENTION  . Sensation of pressure in bladder area   . TMJ (temporomandibular joint syndrome)   . Wears glasses     Family History  Problem Relation Age of Onset  . Heart disease Mother   . Colon polyps Mother   . Alzheimer's disease Mother   . Dementia Mother   . CAD Mother   . Heart disease Father   . Diverticulosis Father   . CAD Father        in his 7s  . Heart disease Brother   . Osteoarthritis Other        Multiple family members  . Obesity Other        Multiple family members  . Depression Other        Multiple family members  . Breast cancer Sister 20       Pam  . Colon cancer Neg Hx   . Esophageal cancer Neg Hx   . Stomach cancer Neg Hx   . Rectal cancer Neg Hx     Social History   Socioeconomic History  . Marital status: Married    Spouse name: Not on file  . Number of children: 2  . Years of education: Not on file  . Highest education level: Not on file  Occupational History  . Occupation: caregiver  Tobacco Use  . Smoking status: Former Smoker     Years: 10.00    Types: Cigarettes    Quit date: 06/10/1973    Years since quitting: 46.2  . Smokeless tobacco: Never Used  Substance and Sexual Activity  . Alcohol use: Yes    Alcohol/week: 2.0 standard drinks    Types: 2 Glasses of wine per week    Comment: social  . Drug use: No  . Sexual activity: Yes  Other Topics Concern  . Not on file  Social History Narrative  . Not on file   Social Determinants of Health   Financial Resource Strain:   . Difficulty of Paying Living Expenses: Not on file  Food Insecurity:   . Worried About Charity fundraiser in the Last Year: Not on file  . Ran Out of Food in the Last Year: Not on file  Transportation Needs:   . Lack of Transportation (Medical): Not on file  . Lack of Transportation (Non-Medical): Not on file  Physical Activity:   . Days of Exercise per Week: Not on file  . Minutes of Exercise per Session: Not on file  Stress:   . Feeling of Stress : Not on file  Social Connections:   . Frequency of Communication with Friends and Family: Not on file  . Frequency of Social Gatherings with Friends and Family: Not on file  . Attends Religious Services: Not on file  . Active Member of Clubs or Organizations: Not on file  . Attends Archivist Meetings: Not on file  . Marital Status: Not on file  Intimate Partner Violence:   . Fear of Current or Ex-Partner: Not on file  . Emotionally Abused: Not on file  . Physically Abused: Not on file  . Sexually Abused: Not on file    Past Medical History, Surgical history, Social history,  and Family history were reviewed and updated as appropriate.   Please see review of systems for further details on the patient's review from today.   Objective:   Physical Exam:  There were no vitals taken for this visit.  Physical Exam Constitutional:      General: She is not in acute distress.    Appearance: She is well-developed.  Musculoskeletal:        General: No deformity.   Neurological:     Mental Status: She is alert and oriented to person, place, and time.     Motor: No tremor.     Coordination: Coordination normal.     Gait: Gait normal.  Psychiatric:        Attention and Perception: Attention normal. She is attentive.        Mood and Affect: Mood normal. Mood is not anxious or depressed. Affect is not labile, blunt, angry or inappropriate.        Speech: Speech normal.        Behavior: Behavior normal.        Thought Content: Thought content normal. Thought content does not include homicidal or suicidal ideation. Thought content does not include homicidal or suicidal plan.        Cognition and Memory: Cognition normal.        Judgment: Judgment normal.     Comments: Insight is good.     Lab Review:     Component Value Date/Time   NA 137 04/20/2019 1432   NA 140 11/03/2018 1029   K 3.8 04/20/2019 1432   CL 101 04/20/2019 1432   CO2 28 04/20/2019 1432   GLUCOSE 138 (H) 04/20/2019 1432   BUN 14 04/20/2019 1432   BUN 11 11/03/2018 1029   CREATININE 0.76 04/20/2019 1432   CREATININE 0.80 01/12/2018 1031   CALCIUM 9.0 04/20/2019 1432   PROT 7.0 04/20/2019 1432   PROT 6.4 11/03/2018 1029   ALBUMIN 4.4 04/20/2019 1432   ALBUMIN 4.4 11/03/2018 1029   AST 18 04/20/2019 1432   ALT 17 04/20/2019 1432   ALKPHOS 89 04/20/2019 1432   BILITOT 0.3 04/20/2019 1432   BILITOT 0.2 11/03/2018 1029   GFRNONAA 74 11/03/2018 1029   GFRNONAA 77 01/12/2018 1031   GFRAA 86 11/03/2018 1029   GFRAA 89 01/12/2018 1031       Component Value Date/Time   WBC 5.0 04/20/2019 1432   RBC 4.18 04/20/2019 1432   HGB 11.9 (L) 04/20/2019 1432   HGB 12.1 11/06/2016 1545   HCT 36.4 04/20/2019 1432   HCT 37.8 11/06/2016 1545   PLT 307.0 04/20/2019 1432   PLT 281 11/06/2016 1545   MCV 87.1 04/20/2019 1432   MCV 89 11/06/2016 1545   MCH 28.9 01/12/2018 1031   MCHC 32.8 04/20/2019 1432   RDW 14.0 04/20/2019 1432   RDW 14.8 11/06/2016 1545   LYMPHSABS 1,108  01/12/2018 1031   MONOABS 0.5 02/17/2017 2244   EOSABS 88 01/12/2018 1031   BASOSABS 28 01/12/2018 1031    No results found for: POCLITH, LITHIUM   No results found for: PHENYTOIN, PHENOBARB, VALPROATE, CBMZ   .res Assessment: Plan:    Primary insomnia - Plan: QUEtiapine (SEROQUEL) 25 MG tablet  Word finding difficulty  Word-finding problems  Supportive therapy on dealing with family situations and worries that [name] will get Alzheimer's also and disagreement with H on variety of subjects and he's been irritable with her.  Disc using faith to strengthen herself with stressors.  Disc  chronic worry over memory issues with normal neuropsych testing.  Normalized some cognitive decline with age.  [name]'s brother has dementia.    Disc risk of sleep meds and risk of not taking them and having poor sleep.  Disc sleep hygiene.  We discussed the short-term risks associated with benzodiazepines including sedation and increased fall risk among others.  Discussed long-term side effect risk including dependence, potential withdrawal symptoms, and the potential eventual dose-related risk of dementia.  Newer studies refute this.    Re: quetiapine low dosage off label for sleep bc of TR insomnia and multiple other med failures.   It has been successful generally.  Discussed potential metabolic side effects associated with atypical antipsychotics, as well as potential risk for movement side effects. Advised pt to contact office if movement side effects occur.    Disc the off-label use of N-Acetylcysteine at 600 mg daily to help with mild cognitive problems.  It can be combined with a B-complex vitamin as the B-12 and folate have been shown to sometimes enhance the effect.  This appt was 30 mins.  FU 1 year  Lynder Parents, MD, DFAPA  Please see After Visit Summary for patient specific instructions.  Future Appointments  Date Time Provider [place]  08/23/2019  2:40 PM Belva Crome, MD  [id number]    No orders of the defined types were placed in this encounter.     -------------------------------

## 2019-08-17 NOTE — Patient Instructions (Signed)
N-Acetylcysteine at 600 mg daily to help with word-finding problems.   It can be combined with a B-complex vitamin as the B-12 and folate have been shown to sometimes enhance the effect.  [email or web address]

## 2019-08-18 DIAGNOSIS — M180 Bilateral primary osteoarthritis of first carpometacarpal joints: Secondary | ICD-10-CM | POA: Diagnosis not present

## 2019-08-18 DIAGNOSIS — M79644 Pain in right finger(s): Secondary | ICD-10-CM | POA: Diagnosis not present

## 2019-08-18 DIAGNOSIS — M79645 Pain in left finger(s): Secondary | ICD-10-CM | POA: Diagnosis not present

## 2019-08-21 NOTE — Progress Notes (Signed)
Cardiology Office Note:    Date:  08/23/2019   ID:  [name], DOB Mar 08, 1951, MRN [id number]  PCP:  Leeroy Cha, MD  Cardiologist:  Sinclair Grooms, MD   Referring MD: Leeroy Cha,*   Chief Complaint  Patient presents with  . Coronary Artery Disease    Exertional angina and nonexertional angina  . Hyperlipidemia  . Hypertension    History of Present Illness:    [name] is a 69 y.o. female with a hx of hyperlipidemia, severe diffuse CAD, class III exertional angina, and anxiety disorder.  [name] appears healthy.  She always looks younger than her underlying disease burden.  Like most, she has been relatively sedentary during the COVID-19 pandemic.  She has gained a few pounds.  She was not able to maintain her multiple day per week exercise regimen when the gyms closed.  She has gotten the COVID-19 vaccine.  She is just now starting to reestablish her active lifestyle.  She has noted dyspnea on exertion and chest tightness if she walks too fast or too far.  Her limitations are new compared to 9 months ago when she was physically more active in a continuous fashion.  She has not had discomfort at rest.  Discomfort and dyspnea that she develops with activity goes away with rest.  She has not needed nitroglycerin.  Past Medical History:  Diagnosis Date  . Anxiety    Psychiatry Dr. Stan Head, concerns for memory loss, anxiety  . Bladder tumor   . Broken heart syndrome   . Cancer ([place])    bladder CA  . Chronic chest pain   . Coronary atherosclerosis CARDIOLOGIST-  DR Daneen Schick   Cath 11/2006 demonstrating moderate obstructive coronary disease with 70-80% septal perforator #1, 50-70% first diagonal, 80% third diagonal, 50% mid and distal LAD & heavy calcification throughout all 3 coronaries. LVF normal.  . Esophageal motility disorder   . Essential hypertension, benign   . Fatty liver 04/28/12  . GERD (gastroesophageal reflux disease)   . H/O  esophageal spasm   . Heart murmur   . History of esophageal spasm    takes ranexa  . History of TIA (transient ischemic attack)    2011--  no residuals  . Hyperlipidemia   . IBS (irritable bowel syndrome)    Diarrhea predominent, esophageal spasm severe, GERD - Dr. Elicia Lamp  . Interstitial cystitis    Dr. Gaynelle Arabian  . Memory loss    Psychiatry Dr. Stan Head, concerns for memory loss, anxiety  . PONV (postoperative nausea and vomiting)    AND URINARY RETENTION  . Sensation of pressure in bladder area   . TMJ (temporomandibular joint syndrome)   . Wears glasses     Past Surgical History:  Procedure Laterality Date  . ABDOMINAL HYSTERECTOMY  AGE 69 -- 1989  . APPENDECTOMY  1971   AND OVARIAN CYSTECTOMY  . BENIGN EXCISIONAL BREAST BX  1996  . BREAST EXCISIONAL BIOPSY Right 1992  . CARDIAC CATHETERIZATION  11-28-2006   DR Daneen Schick    moderate cad/  70-80% septal perforator #1, 50-70% first diagonal, 80% third diagonal, 50% mid and distal LAD & heavy calcification throughout all 3 coronaries. LVF normal.  . CARDIAC CATHETERIZATION  03-26-2012   DR Daneen Schick   patent CFX,  RCA,  & pLAD/  mLAD >50% to <70% a focal eccentric region that overlaps the third diagonal/  90% diagonal ostial and unchanged from prior study /  ef 65%  . CARPAL TUNNEL RELEASE    . CYSTO WITH HYDRODISTENSION N/A 01/14/2014   Procedure: CYSTOSCOPY/HYDRODISTENSION/INSERTION OF MARCAINE AND PYRIDUIM INTO BLADDER/INJECTION OF MARCAINE AND KENALOG into sub trigone space ;  Surgeon: Ailene Rud, MD;  Location: [facility];  Service: Urology;  Laterality: N/A;  . CYSTO/  HYDRODISTENTION/  INSTILLATION THERAPY  03-12-2010  . ESOPHAGOGASTRODUODENOSCOPY  04/30/2012   Procedure: ESOPHAGOGASTRODUODENOSCOPY (EGD);  Surgeon: Lafayette Dragon, MD;  Location: [facility];  Service: Endoscopy;  Laterality: N/A;  . LEFT HEART CATH AND CORONARY ANGIOGRAPHY N/A 02/18/2017   Procedure: LEFT HEART CATH AND  CORONARY ANGIOGRAPHY;  Surgeon: Belva Crome, MD;  Location: [facility];  Service: Cardiovascular;  Laterality: N/A;  . LUMBAR [place]  . NEGATIVE SLEEP STUDY  2010  . SHOULDER ARTHROSCOPY Left 12-23-2011  . TRANSTHORACIC ECHOCARDIOGRAM  04-21-2012   GRADE I DIASTOLIC DYSFUNCTION/  EF 60-65%/  MILD MR  &  TR  . TRANSURETHRAL RESECTION OF BLADDER TUMOR Right 01/03/2015   Procedure: TRANSURETHRAL RESECTION OF BLADDER TUMOR (TURBT);  Surgeon: Carolan Clines, MD;  Location: [facility];  Service: Urology;  Laterality: Right;    Current Medications: Current Meds  Medication Sig  . acyclovir (ZOVIRAX) 400 MG tablet Take 400 mg by mouth as needed. As needed  . aspirin 81 MG tablet Take 81 mg by mouth at bedtime.   Marland Kitchen atorvastatin (LIPITOR) 40 MG tablet TAKE 1 TABLET EVERY DAY  . BYSTOLIC 2.5 MG tablet TAKE 1 TABLET EVERY DAY  . cyclobenzaprine (FLEXERIL) 10 MG tablet Take 10 mg by mouth as needed.  . dicyclomine (BENTYL) 10 MG capsule TAKE 1 CAPSULE(10 MG) BY MOUTH TWICE DAILY  . Digestive Enzymes (MULTI-ENZYME) TABS Take 1 tablet by mouth as needed.  . diltiazem 2 % GEL Apply 1 application topically as needed. For rectal spasm  . estradiol (CLIMARA - DOSED IN MG/24 HR) 0.025 mg/24hr patch Place 0.025 mg onto the skin once a week.  . ezetimibe (ZETIA) 10 MG tablet TAKE 1 TABLET (10 MG TOTAL) BY MOUTH DAILY.  . hyoscyamine (LEVSIN SL) 0.125 MG SL tablet Place 1 tablet (0.125 mg total) under the tongue daily as needed.  Marland Kitchen ibuprofen (ADVIL,MOTRIN) 200 MG tablet Take 600 mg by mouth every 6 (six) hours as needed for headache or moderate pain.  Marland Kitchen NEXIUM 40 MG capsule Take 1 capsule (40 mg total) by mouth daily.  . nitroGLYCERIN (NITROSTAT) 0.4 MG SL tablet place 1 tablet under the tongue if needed every 5 minutes for chest pain for 3 doses IF NO RELIEF AFTER 3RD DOSE CALL PRESCRIBER OR 911.  Marland Kitchen oxyCODONE (OXY IR/ROXICODONE) 5 MG immediate release tablet Take 5 mg  by mouth as needed for moderate pain.   Marland Kitchen Peppermint Oil (IBGARD) 90 MG CPCR Take 2 capsules by mouth as needed.  . Probiotic Product (PROBIOTIC ACIDOPHILUS) CAPS Take 1 capsule by mouth daily.  . QUEtiapine (SEROQUEL) 25 MG tablet Take 1 tablet (25 mg total) by mouth at bedtime.  . ranolazine (RANEXA) 500 MG 12 hr tablet Take 1 tablet (500 mg total) by mouth 2 (two) times daily.  . traMADol (ULTRAM) 50 MG tablet Take 50 mg by mouth every 6 (six) hours as needed.     Allergies:   Clindamycin/lincomycin, Doxycycline, Epinephrine, Escitalopram oxalate, Macrodantin, Trazodone, Trazodone and nefazodone, Trimethoprim, Lincomycin, Nitrofurantoin, and Nitrofurantoin macrocrystal   Social History   Socioeconomic History  . Marital status: Married  Spouse name: Not on file  . Number of children: 2  . Years of education: Not on file  . Highest education level: Not on file  Occupational History  . Occupation: caregiver  Tobacco Use  . Smoking status: Former Smoker    Years: 10.00    Types: Cigarettes    Quit date: 06/10/1973    Years since quitting: 46.2  . Smokeless tobacco: Never Used  Substance and Sexual Activity  . Alcohol use: Yes    Alcohol/week: 2.0 standard drinks    Types: 2 Glasses of wine per week    Comment: social  . Drug use: No  . Sexual activity: Yes  Other Topics Concern  . Not on file  Social History Narrative  . Not on file   Social Determinants of Health   Financial Resource Strain:   . Difficulty of Paying Living Expenses:   Food Insecurity:   . Worried About Charity fundraiser in the Last Year:   . Arboriculturist in the Last Year:   Transportation Needs:   . Film/video editor (Medical):   Marland Kitchen Lack of Transportation (Non-Medical):   Physical Activity:   . Days of Exercise per Week:   . Minutes of Exercise per Session:   Stress:   . Feeling of Stress :   Social Connections:   . Frequency of Communication with Friends and Family:   . Frequency of  Social Gatherings with Friends and Family:   . Attends Religious Services:   . Active Member of Clubs or Organizations:   . Attends Archivist Meetings:   Marland Kitchen Marital Status:      Family History: The patient's family history includes Alzheimer's disease in her mother; Breast cancer (age of onset: 52) in her sister; CAD in her father and mother; Colon polyps in her mother; Dementia in her mother; Depression in an other family member; Diverticulosis in her father; Heart disease in her brother, father, and mother; Obesity in an other family member; Osteoarthritis in an other family member. There is no history of Colon cancer, Esophageal cancer, Stomach cancer, or Rectal cancer.  ROS:   Please see the history of present illness.    Frustrated because she is limited in endurance.  She is trying to instantly start back at a level of exertional tolerance that she had just prior to the pandemic and is not achieving it.  She has not had syncope.  She denies lower extremity swelling.  No claudication.  Memory seems improved.  All other systems reviewed and are negative.  EKGs/Labs/Other Studies Reviewed:    The following studies were reviewed today: No new imaging or functional data  EKG:  EKG not performed on today's visit  Recent Labs: 04/20/2019: ALT 17; BUN 14; Creatinine, Ser 0.76; Hemoglobin 11.9; Platelets 307.0; Potassium 3.8; Sodium 137; TSH 1.72  Recent Lipid Panel    Component Value Date/Time   CHOL 118 11/03/2018 1029   TRIG 90 11/03/2018 1029   HDL 50 11/03/2018 1029   CHOLHDL 2.4 11/03/2018 1029   CHOLHDL 2.4 02/18/2017 0824   VLDL 17 02/18/2017 0824   LDLCALC 50 11/03/2018 1029    Physical Exam:    VS:  BP (!) 118/58   Pulse (!) 54   Ht 5\' 2"  (1.575 m)   Wt 129 lb 12.8 oz (58.9 kg)   SpO2 98%   BMI 23.74 kg/m     Wt Readings from Last 3 Encounters:  08/23/19 129 lb 12.8  oz (58.9 kg)  04/28/19 127 lb (57.6 kg)  04/20/19 127 lb 12.8 oz (58 kg)     GEN:  Appears younger than stated age. No acute distress HEENT: Normal NECK: No JVD. LYMPHATICS: No lymphadenopathy CARDIAC:  RRR without murmur, gallop, or edema. VASCULAR:  Normal Pulses. No bruits. RESPIRATORY:  Clear to auscultation without rales, wheezing or rhonchi  ABDOMEN: Soft, non-tender, non-distended, No pulsatile mass, MUSCULOSKELETAL: No deformity  SKIN: Warm and dry NEUROLOGIC:  Alert and oriented x 3 PSYCHIATRIC:  Normal affect   ASSESSMENT:    1. Coronary artery disease involving native coronary artery of native heart without angina pectoris   2. Essential hypertension   3. Other hyperlipidemia   4. Educated about COVID-19 virus infection    PLAN:    In order of problems listed above:  1. Secondary prevention is discussed.  A more lenient approach to regaining endurance is advocated.  I think that she has become somewhat deconditioned due to COVID-19 pandemic.  She does describe a walk-through phenomenon.  I cautioned her against continuing to exercise despite tightness in her chest.  She should exercise to the level of discomfort and then stop and then repeat the cycle.  This would allow her to more safely reach the walk-through phase at which time she can do continuous activity.  I encouraged her to use nitroglycerin if she has significant discomfort that does not go away promptly with nitroglycerin. 2. Blood pressure is well controlled, on no current therapy. 3. LDL was 56 when last checked in December.  Continue Zetia and high intensity atorvastatin therapy. 4. COVID-19 vaccine has been received.  3W's is being practiced.  Overall education and awareness concerning secondary risk prevention was discussed in detail: LDL less than 70, hemoglobin A1c less than 7, blood pressure target less than 130/80 mmHg, >150 minutes of moderate aerobic activity per week, avoidance of smoking, weight control (via diet and exercise), and continued surveillance/management of/for obstructive  sleep apnea.     Medication Adjustments/Labs and Tests Ordered: Current medicines are reviewed at length with the patient today.  Concerns regarding medicines are outlined above.  No orders of the defined types were placed in this encounter.  No orders of the defined types were placed in this encounter.   Patient Instructions  Medication Instructions:  Your physician recommends that you continue on your current medications as directed. Please refer to the Current Medication list given to you today.  *If you need a refill on your cardiac medications before your next appointment, please call your pharmacy*   Lab Work: None If you have labs (blood work) drawn today and your tests are completely normal, you will receive your results only by: Marland Kitchen MyChart Message (if you have MyChart) OR . A paper copy in the mail If you have any lab test that is abnormal or we need to change your treatment, we will call you to review the results.   Testing/Procedures: None   Follow-Up: At [facility], you and your health needs are our priority.  As part of our continuing mission to provide you with exceptional heart care, we have created designated Provider Care Teams.  These Care Teams include your primary Cardiologist (physician) and Advanced Practice Providers (APPs -  Physician Assistants and Nurse Practitioners) who all work together to provide you with the care you need, when you need it.  We recommend signing up for the patient portal called "MyChart".  Sign up information is provided on this After Visit  Summary.  MyChart is used to connect with patients for Virtual Visits (Telemedicine).  Patients are able to view lab/test results, encounter notes, upcoming appointments, etc.  Non-urgent messages can be sent to your provider as well.   To learn more about what you can do with MyChart, go to [email or web address].    Your next appointment:   6 month(s)  The format for your next  appointment:   In Person  Provider:   You may see Sinclair Grooms, MD or one of the following Advanced Practice Providers on your designated Care Team:    Truitt Merle, NP  Cecilie Kicks, NP  Kathyrn Drown, NP    Other Instructions      Signed, Sinclair Grooms, MD  08/23/2019 5:55 PM    [name]

## 2019-08-23 ENCOUNTER — Other Ambulatory Visit: Payer: Self-pay

## 2019-08-23 ENCOUNTER — Encounter: Payer: Self-pay | Admitting: Interventional Cardiology

## 2019-08-23 ENCOUNTER — Ambulatory Visit (INDEPENDENT_AMBULATORY_CARE_PROVIDER_SITE_OTHER): Payer: Medicare Other | Admitting: Interventional Cardiology

## 2019-08-23 VITALS — BP 118/58 | HR 54 | Ht 62.0 in | Wt 129.8 lb

## 2019-08-23 DIAGNOSIS — E7849 Other hyperlipidemia: Secondary | ICD-10-CM | POA: Diagnosis not present

## 2019-08-23 DIAGNOSIS — I2510 Atherosclerotic heart disease of native coronary artery without angina pectoris: Secondary | ICD-10-CM | POA: Diagnosis not present

## 2019-08-23 DIAGNOSIS — Z7189 Other specified counseling: Secondary | ICD-10-CM | POA: Diagnosis not present

## 2019-08-23 DIAGNOSIS — I10 Essential (primary) hypertension: Secondary | ICD-10-CM | POA: Diagnosis not present

## 2019-08-23 NOTE — Patient Instructions (Signed)

## 2019-09-07 ENCOUNTER — Other Ambulatory Visit: Payer: Self-pay | Admitting: Internal Medicine

## 2019-09-07 DIAGNOSIS — Z1231 Encounter for screening mammogram for malignant neoplasm of breast: Secondary | ICD-10-CM

## 2019-09-16 ENCOUNTER — Ambulatory Visit: Payer: Medicare Other

## 2019-09-29 ENCOUNTER — Ambulatory Visit
Admission: RE | Admit: 2019-09-29 | Discharge: 2019-09-29 | Disposition: A | Discharge status: Home / Self Care | Payer: Medicare Other | Source: Ambulatory Visit | Attending: Internal Medicine | Admitting: Internal Medicine

## 2019-09-29 ENCOUNTER — Other Ambulatory Visit: Payer: Self-pay

## 2019-09-29 DIAGNOSIS — Z1231 Encounter for screening mammogram for malignant neoplasm of breast: Secondary | ICD-10-CM

## 2019-10-02 ENCOUNTER — Other Ambulatory Visit: Payer: Self-pay | Admitting: Gastroenterology

## 2019-10-04 NOTE — Telephone Encounter (Signed)
Patient needs office visit.  

## 2019-10-18 ENCOUNTER — Other Ambulatory Visit: Payer: Self-pay | Admitting: Interventional Cardiology

## 2019-10-29 DIAGNOSIS — I2510 Atherosclerotic heart disease of native coronary artery without angina pectoris: Secondary | ICD-10-CM | POA: Diagnosis not present

## 2019-10-29 DIAGNOSIS — E7800 Pure hypercholesterolemia, unspecified: Secondary | ICD-10-CM | POA: Diagnosis not present

## 2019-10-29 DIAGNOSIS — C689 Malignant neoplasm of urinary organ, unspecified: Secondary | ICD-10-CM | POA: Diagnosis not present

## 2019-10-29 DIAGNOSIS — E785 Hyperlipidemia, unspecified: Secondary | ICD-10-CM | POA: Diagnosis not present

## 2019-10-29 DIAGNOSIS — I6350 Cerebral infarction due to unspecified occlusion or stenosis of unspecified cerebral artery: Secondary | ICD-10-CM | POA: Diagnosis not present

## 2019-11-01 DIAGNOSIS — M7542 Impingement syndrome of left shoulder: Secondary | ICD-10-CM | POA: Diagnosis not present

## 2019-11-01 DIAGNOSIS — M25512 Pain in left shoulder: Secondary | ICD-10-CM | POA: Diagnosis not present

## 2019-11-03 ENCOUNTER — Other Ambulatory Visit: Payer: Self-pay

## 2019-11-03 ENCOUNTER — Telehealth: Payer: Self-pay | Admitting: Gastroenterology

## 2019-11-03 ENCOUNTER — Other Ambulatory Visit: Payer: Self-pay | Admitting: Gastroenterology

## 2019-11-03 DIAGNOSIS — M25512 Pain in left shoulder: Secondary | ICD-10-CM | POA: Diagnosis not present

## 2019-11-03 DIAGNOSIS — M180 Bilateral primary osteoarthritis of first carpometacarpal joints: Secondary | ICD-10-CM | POA: Diagnosis not present

## 2019-11-03 MED ORDER — DICYCLOMINE HCL 10 MG PO CAPS: 10.0000 mg | ORAL_CAPSULE | Freq: Two times a day (BID) | ORAL | 0 refills | Status: DC | PRN

## 2019-11-03 NOTE — Telephone Encounter (Signed)
Patient needs to refill dyciclomine for 90-day supply. Please send it to [facility] on [address].

## 2019-11-03 NOTE — Telephone Encounter (Signed)
It is fine to send refill for dicyclomine for 90 days.  Thank you

## 2019-11-03 NOTE — Telephone Encounter (Signed)
Okay to refill? 

## 2019-12-09 ENCOUNTER — Telehealth: Payer: Self-pay | Admitting: Nurse Practitioner

## 2019-12-09 NOTE — Telephone Encounter (Signed)
Another triage type message

## 2019-12-09 NOTE — Telephone Encounter (Signed)
Looks like this should go to [place], [place] pt.

## 2019-12-09 NOTE — Telephone Encounter (Signed)
Patient calls with complaints of a gnawing like pain that occurs about 2 hours after she eats. She has taken the Nexium without interruption. No reflux. "I hear the noise like things are trying to move on and just don't" referring to her abdominal noises. She feels a tenderness in her upper abdomen if she pushes below her rib cage. The bowel movements are loose. Please advise.

## 2019-12-09 NOTE — Telephone Encounter (Signed)
Patient called states she has chronic abdominal pain

## 2019-12-16 ENCOUNTER — Telehealth: Payer: Self-pay | Admitting: Gastroenterology

## 2019-12-16 NOTE — Telephone Encounter (Signed)
See the telephone encounter from 12/09/19

## 2019-12-16 NOTE — Telephone Encounter (Signed)
I do not think so  Pudding like consistency not consistent with bacterial, parasitic or viral infection

## 2019-12-16 NOTE — Telephone Encounter (Signed)
Dr Carlean Purl Patient of Dr Silverio Decamp. Hx of IBS I have spoken with the patient today. She continues to have abdominal cramping and loose (pudding consistency) bowel movements. She feels an urge to go but produces only a little stool each time. She has an appointment for evaulation 12/24/19. Would labs prior to the appointment be helpful to rule out an infectious process?

## 2019-12-16 NOTE — Telephone Encounter (Signed)
I found her an appointment with [facility] on 12/24/19. Please review and advise if something else needs to be done prior. She is going to take the Bentyl.

## 2019-12-20 NOTE — Telephone Encounter (Signed)
Ok thanks 

## 2019-12-24 ENCOUNTER — Encounter: Payer: Self-pay | Admitting: Nurse Practitioner

## 2019-12-24 ENCOUNTER — Ambulatory Visit (INDEPENDENT_AMBULATORY_CARE_PROVIDER_SITE_OTHER): Payer: Medicare Other | Admitting: Nurse Practitioner

## 2019-12-24 ENCOUNTER — Other Ambulatory Visit (INDEPENDENT_AMBULATORY_CARE_PROVIDER_SITE_OTHER): Payer: Medicare Other

## 2019-12-24 VITALS — BP 134/66 | HR 69 | Ht 62.0 in | Wt 128.4 lb

## 2019-12-24 DIAGNOSIS — R1013 Epigastric pain: Secondary | ICD-10-CM

## 2019-12-24 DIAGNOSIS — R197 Diarrhea, unspecified: Secondary | ICD-10-CM | POA: Diagnosis not present

## 2019-12-24 LAB — CBC WITH DIFFERENTIAL/PLATELET
Basophils Absolute: 0 10*3/uL (ref 0.0–0.1)
Basophils Relative: 0.4 % (ref 0.0–3.0)
Eosinophils Absolute: 0.1 10*3/uL (ref 0.0–0.7)
Eosinophils Relative: 1.1 % (ref 0.0–5.0)
HCT: 35.7 % — ABNORMAL LOW (ref 36.0–46.0)
Hemoglobin: 11.9 g/dL — ABNORMAL LOW (ref 12.0–15.0)
Lymphocytes Relative: 20.5 % (ref 12.0–46.0)
Lymphs Abs: 1.5 10*3/uL (ref 0.7–4.0)
MCHC: 33.5 g/dL (ref 30.0–36.0)
MCV: 85.8 fl (ref 78.0–100.0)
Monocytes Absolute: 0.6 10*3/uL (ref 0.1–1.0)
Monocytes Relative: 8.5 % (ref 3.0–12.0)
Neutro Abs: 5.1 10*3/uL (ref 1.4–7.7)
Neutrophils Relative %: 69.5 % (ref 43.0–77.0)
Platelets: 267 10*3/uL (ref 150.0–400.0)
RBC: 4.16 Mil/uL (ref 3.87–5.11)
RDW: 15.1 % (ref 11.5–15.5)
WBC: 7.3 10*3/uL (ref 4.0–10.5)

## 2019-12-24 LAB — COMPREHENSIVE METABOLIC PANEL
ALT: 17 U/L (ref 0–35)
AST: 17 U/L (ref 0–37)
Albumin: 4.3 g/dL (ref 3.5–5.2)
Alkaline Phosphatase: 83 U/L (ref 39–117)
BUN: 13 mg/dL (ref 6–23)
CO2: 31 mEq/L (ref 19–32)
Calcium: 9.2 mg/dL (ref 8.4–10.5)
Chloride: 99 mEq/L (ref 96–112)
Creatinine, Ser: 0.89 mg/dL (ref 0.40–1.20)
GFR: 63.02 mL/min (ref >60.00)
Glucose, Bld: 93 mg/dL (ref 70–99)
Potassium: 4.3 mEq/L (ref 3.5–5.1)
Sodium: 136 mEq/L (ref 135–145)
Total Bilirubin: 0.3 mg/dL (ref 0.2–1.2)
Total Protein: 6.9 g/dL (ref 6.0–8.3)

## 2019-12-24 LAB — LIPASE: Lipase: 42 U/L (ref 11.0–59.0)

## 2019-12-24 LAB — C-REACTIVE PROTEIN: CRP: <1 mg/dL (ref 0.5–20.0)

## 2019-12-24 NOTE — Patient Instructions (Addendum)
If you are age 68 or older, your body mass index should be between 23-30. Your Body mass index is 23.48 kg/m. If this is out of the aforementioned range listed, please consider follow up with your Primary Care Provider.  If you are age 81 or younger, your body mass index should be between 19-25. Your Body mass index is 23.48 kg/m. If this is out of the aformentioned range listed, please consider follow up with your Primary Care Provider.    Your provider has requested that you go to the basement level for lab work before leaving today. Press "B" on the elevator. The lab is located at the first door on the left as you exit the elevator.  Continue with you Hyoscyamine 0.125 1 tablet sublingual three times a day. As need for abdominal pain  Call the office if symptoms worsen Go to the Emergency room if severe abdominal pain occurs.  Due to recent changes in healthcare laws, you may see the results of your imaging and laboratory studies on MyChart before your provider has had a chance to review them.  We understand that in some cases there may be results that are confusing or concerning to you. Not all laboratory results come back in the same time frame and the provider may be waiting for multiple results in order to interpret others.  Please give [place] 48 hours in order for your provider to thoroughly review all the results before contacting the office for clarification of your results.

## 2019-12-24 NOTE — Progress Notes (Signed)
12/24/2019 LATEISHA THURLOW [id number] 24-Aug-1950   Chief Complaint: Diarrhea   History of Present Illness: Ileigh Mettler. Gustafson is a 69 year old female with a past medical history of anxiety, hypertension, coronary artery disease with class III exertional angina, "broken heart syndrome", TIA 2011, bladder cancer, fatty liver and IBS.   I last saw the patient in the office 04/20/2019, at that time she was having dysphagia and GERD symptoms.  He underwent an EGD with Dr. Silverio Decamp 04/28/2019 which showed gastritis, biopsies were negative.  The esophagus was normal.  Denies having any current GERD or dysphagia symptoms at this time.  She presents today with complaints of  intermittent nausea.  No vomiting.  At times, she feels as if food just sits in her stomach.  On December 13, 2019 she felt starving and developed epigastric pain.  She ate food with immediate relief but 30 minutes later her epigastric pain recurred and she felt hungry again.  Her epigastric pain occurred daily from July 5 through July 13.  Hyoscyamine was more effective than Dicyclomine.  She is concerned about taking Hyoscyamine as she heard this medication may cause dementia.  She has abdominal bloat at times as well.  She had explosive pudding-like diarrhea on 2 weeks ago then went 8 days without passing a bowel movement.  She is now passing several loose bowel-like stools daily.  She noticed her stool has a foul odor.  No rectal bleeding.  She is having generalized abdominal cramping during defecation.  She feels better when she does not pass a bowel movement.  No rectal bleeding or melena.  No recent antibiotics or new medications.  She infrequently takes ibuprofen.  No weight loss.  No fever, sweats or chills. She underwent a flexible sigmoidoscopy January 22, 2016 for persistent anorectal and pelvic pain showed internal hemorrhoids otherwise normal.  Her most recent colonoscopy was October 29, 2014which showed internal hemorrhoids otherwise  normal exam. Mother with history of colon polyps and breast cancer. No family history of colorectal cancer.   EGD 04/28/2019: - No endoscopic esophageal abnormality to explain patient's dysphagia. - Z-line regular, 38 cm from the incisors. - Gastritis. Biopsied. - Normal examined duodenum.  Current Outpatient Medications on File Prior to Visit  Medication Sig Dispense Refill  . acyclovir (ZOVIRAX) 400 MG tablet Take 400 mg by mouth as needed. As needed    . aspirin 81 MG tablet Take 81 mg by mouth at bedtime.     Marland Kitchen atorvastatin (LIPITOR) 40 MG tablet TAKE 1 TABLET EVERY DAY 90 tablet 2  . BYSTOLIC 2.5 MG tablet TAKE 1 TABLET EVERY DAY 90 tablet 2  . cyclobenzaprine (FLEXERIL) 10 MG tablet Take 10 mg by mouth as needed.    . dicyclomine (BENTYL) 10 MG capsule Take 1 capsule (10 mg total) by mouth 2 (two) times daily as needed for spasms. 180 capsule 0  . Digestive Enzymes (MULTI-ENZYME) TABS Take 1 tablet by mouth as needed.    . diltiazem 2 % GEL Apply 1 application topically as needed. For rectal spasm 30 g 1  . ezetimibe (ZETIA) 10 MG tablet TAKE 1 TABLET (10 MG TOTAL) BY MOUTH DAILY. 90 tablet 2  . hyoscyamine (LEVSIN SL) 0.125 MG SL tablet Take 1 tablet (0.125 mg total) by mouth daily as needed. Please schedule office visit. 90 tablet 0  . ibuprofen (ADVIL,MOTRIN) 200 MG tablet Take 600 mg by mouth every 6 (six) hours as needed for headache or moderate pain.    Marland Kitchen  NEXIUM 40 MG capsule Take 1 capsule (40 mg total) by mouth daily. 90 capsule 3  . nitroGLYCERIN (NITROSTAT) 0.4 MG SL tablet place 1 tablet under the tongue if needed every 5 minutes for chest pain for 3 doses IF NO RELIEF AFTER 3RD DOSE CALL PRESCRIBER OR 911. 75 tablet 1  . oxyCODONE (OXY IR/ROXICODONE) 5 MG immediate release tablet Take 5 mg by mouth as needed for moderate pain.     Marland Kitchen Peppermint Oil (IBGARD) 90 MG CPCR Take 2 capsules by mouth as needed.    . Probiotic Product (PROBIOTIC ACIDOPHILUS) CAPS Take 1 capsule by  mouth daily.    . QUEtiapine (SEROQUEL) 25 MG tablet Take 1 tablet (25 mg total) by mouth at bedtime. 90 tablet 3  . ranolazine (RANEXA) 500 MG 12 hr tablet TAKE 1 TABLET TWICE DAILY 180 tablet 2  . traMADol (ULTRAM) 50 MG tablet Take 50 mg by mouth every 6 (six) hours as needed.     No current facility-administered medications on file prior to visit.    Allergies  Allergen Reactions  . Clindamycin/Lincomycin Other (See Comments)    Pt not sure of reaction   . Doxycycline Nausea Only and Nausea And Vomiting  . Epinephrine   . Escitalopram Oxalate Nausea Only  . Macrodantin Other (See Comments)    Flushed, hot  . Trazodone Nausea Only  . Trazodone And Nefazodone Nausea Only  . Trimethoprim Other (See Comments)    unknown   . Lincomycin Rash    Reaction unknown  . Nitrofurantoin Rash    Flushed, hot  . Nitrofurantoin Macrocrystal Anxiety    Flushing, hot face and ears     Current Medications, Allergies, Past Medical History, Past Surgical History, Family History and Social History were reviewed in [employer] record.   Physical Exam: BP 134/66 (BP Location: Right Arm, Patient Position: Sitting, Cuff Size: Normal)   Pulse 69   Ht 5\' 2"  (1.575 m)   Wt 128 lb 6 oz (58.2 kg)   SpO2 98%   BMI 23.48 kg/m   Wt Readings from Last 3 Encounters:  12/24/19 128 lb 6 oz (58.2 kg)  08/23/19 129 lb 12.8 oz (58.9 kg)  04/28/19 127 lb (57.6 kg)   General: Well developed 69 year old female in no acute distress. Head: Normocephalic and atraumatic. Eyes: No scleral icterus. Conjunctiva pink . Lungs: Clear throughout to auscultation. Heart: Regular rate and rhythm, no murmur. Abdomen: Soft, nontender and nondistended. No masses or hepatomegaly. Normal bowel sounds x 4 quadrants.  Rectal: Deferred.  Musculoskeletal: Symmetrical with no gross deformities. Extremities: No edema. Neurological: Alert oriented x 4. No focal deficits.  Psychological: Alert and  cooperative. Normal mood and affect  Assessment and Recommendations:  20.  69 year old female with nausea and epigastric pain.  Basically normal EGD 04/2019. tTg and IgA levels normal.  -CBC, CMP, CRP and lipase level -I discussed scheduling abdominal ultrasound to rule out gallstones versus an abdominal/pelvic CT scan to rule out intra abdominal/pelvic pathology, await the above lab results prior to ordering abdominal image study -Hyoscyamine 0.125 mg 1 tab sublingual every 6 to 8 hours as needed -IBGARD 1 po bid -Patient to call our office if her symptoms worsen -Patient to pesent to the ED if severe abdominal pain occurs  2. Diarrhea -GI pathogen panel  -Probiotic of choice once daily   3. Colon cancer screening, up to date. Recall colonoscopy 01/2023.

## 2020-01-10 ENCOUNTER — Ambulatory Visit: Payer: Medicare Other | Admitting: Physician Assistant

## 2020-01-11 NOTE — Progress Notes (Signed)
Reviewed and agree with documentation and assessment and plan. K. Veena Lennyn Bellanca , MD   

## 2020-01-14 ENCOUNTER — Telehealth: Payer: Self-pay | Admitting: Nurse Practitioner

## 2020-01-14 NOTE — Telephone Encounter (Signed)
Patient was seen 12/24/19. Patient calls with complaints of continued GI distress. She has issues with her upper GI with nausea, some reflux, "feels like I swallowed a boot" and a general discomfort. She has loose stool but not longer has diarrhea. She will feel a gnawing hunger, eat a piece of bread and feel better. Then 20 minutes later she starts having the uncomfortable upper GI distress again. She is ready to take the next step in determining the cause. Do you want an abdominal CT or an abdominal u/s?

## 2020-01-14 NOTE — Telephone Encounter (Signed)
Patient calling to schedule an MRI or CT she's not sure

## 2020-01-17 ENCOUNTER — Other Ambulatory Visit: Payer: Self-pay

## 2020-01-17 DIAGNOSIS — R1084 Generalized abdominal pain: Secondary | ICD-10-CM

## 2020-01-17 DIAGNOSIS — R1013 Epigastric pain: Secondary | ICD-10-CM

## 2020-01-17 NOTE — Telephone Encounter (Signed)
Spoke with this really nice patient. She is advised. She knows if she develops loose stool, then she should collect it for the lab test.  Abd u/s 01/26/20 at 9:30 am. Arrive 9:10 am NPO after midnight. Will try to find an earlier date for this is possible.

## 2020-01-17 NOTE — Telephone Encounter (Signed)
[name], pls contact the patient. She needs to do the GI panel if she is having loose stool, order previously entered. Ok to send her for an abdominal sonogram DX epigastric pain.   She can take Pepcid 20mg  otc one po daily. Continue her Nexium 40mg  QD.  Pt to call if sx worsen. Thx

## 2020-01-26 ENCOUNTER — Ambulatory Visit
Admission: RE | Admit: 2020-01-26 | Discharge: 2020-01-26 | Disposition: A | Discharge status: Home / Self Care | Payer: Medicare Other | Source: Ambulatory Visit | Attending: Nurse Practitioner | Admitting: Nurse Practitioner

## 2020-01-26 DIAGNOSIS — I700 Atherosclerosis of aorta: Secondary | ICD-10-CM | POA: Diagnosis not present

## 2020-01-26 DIAGNOSIS — R1084 Generalized abdominal pain: Secondary | ICD-10-CM | POA: Diagnosis not present

## 2020-01-26 DIAGNOSIS — R1013 Epigastric pain: Secondary | ICD-10-CM

## 2020-01-28 ENCOUNTER — Telehealth: Payer: Self-pay | Admitting: Nurse Practitioner

## 2020-01-28 ENCOUNTER — Telehealth: Payer: Self-pay | Admitting: Interventional Cardiology

## 2020-01-28 NOTE — Telephone Encounter (Signed)
New Message  Pt is calling and says she had an ultrasound on her abdomen, She said something came back about her aorta. She has questions She says she is not sure if this is new or old information and is wanting Dr Tamala Julian and his nurse to review the results and get back with her   Please call

## 2020-01-28 NOTE — Telephone Encounter (Signed)
Spoke with pt and discussed atherosclerosis noted on abd [place].  Made her aware this was mentioned on previous abd ultrasounds as well.  Advised for her to continue current medications and that no aneurysm is great! Pt appreciative for call.

## 2020-01-28 NOTE — Telephone Encounter (Signed)
Symptoms do not improve. Please advise on the next step. Thanks

## 2020-02-01 DIAGNOSIS — C689 Malignant neoplasm of urinary organ, unspecified: Secondary | ICD-10-CM | POA: Diagnosis not present

## 2020-02-01 DIAGNOSIS — E7800 Pure hypercholesterolemia, unspecified: Secondary | ICD-10-CM | POA: Diagnosis not present

## 2020-02-01 DIAGNOSIS — I6350 Cerebral infarction due to unspecified occlusion or stenosis of unspecified cerebral artery: Secondary | ICD-10-CM | POA: Diagnosis not present

## 2020-02-01 DIAGNOSIS — I2510 Atherosclerotic heart disease of native coronary artery without angina pectoris: Secondary | ICD-10-CM | POA: Diagnosis not present

## 2020-02-01 DIAGNOSIS — E785 Hyperlipidemia, unspecified: Secondary | ICD-10-CM | POA: Diagnosis not present

## 2020-02-03 DIAGNOSIS — C674 Malignant neoplasm of posterior wall of bladder: Secondary | ICD-10-CM | POA: Diagnosis not present

## 2020-02-03 NOTE — Telephone Encounter (Signed)
[name], I called the patient, our plan was to order a CTAP if her sx persisted. She is having upper and lower abd pain. She is using Ibgard, gas x, probiotic, Hyoscyamine without improvement. Stools intermittently loose. A GI pathogen panel was previously ordered, she has the container and she will submit that test next loose BM. Please contact her and schedule her for an abd/pelvic CT with oral and IV contrast. She had recent labs, Cr normal. She said you can just schedule the CT and just let her know when and where. She will call our office if her sx worsen. Thx

## 2020-02-04 ENCOUNTER — Other Ambulatory Visit: Payer: Self-pay

## 2020-02-04 DIAGNOSIS — R1013 Epigastric pain: Secondary | ICD-10-CM

## 2020-02-04 DIAGNOSIS — R1084 Generalized abdominal pain: Secondary | ICD-10-CM

## 2020-02-04 NOTE — Telephone Encounter (Signed)
Pt scheduled for CT of A/P at [facility] 02/09/20@5pm , pt to arrive there at 4:30pm. Pt to be NPO after 1pm, drink bottle 1 of contrast at 3 and bottle 2 at 4pm. Pt aware of appt and knows to pick up contrast at [facility] prior to scan.

## 2020-02-07 DIAGNOSIS — M180 Bilateral primary osteoarthritis of first carpometacarpal joints: Secondary | ICD-10-CM | POA: Diagnosis not present

## 2020-02-07 DIAGNOSIS — M79644 Pain in right finger(s): Secondary | ICD-10-CM | POA: Diagnosis not present

## 2020-02-07 DIAGNOSIS — M79645 Pain in left finger(s): Secondary | ICD-10-CM | POA: Diagnosis not present

## 2020-02-09 ENCOUNTER — Ambulatory Visit (HOSPITAL_COMMUNITY)
Admission: RE | Admit: 2020-02-09 | Discharge: 2020-02-09 | Disposition: A | Discharge status: Home / Self Care | Payer: Medicare Other | Source: Ambulatory Visit | Attending: Nurse Practitioner | Admitting: Nurse Practitioner

## 2020-02-09 ENCOUNTER — Other Ambulatory Visit: Payer: Self-pay

## 2020-02-09 ENCOUNTER — Encounter (HOSPITAL_COMMUNITY): Payer: Self-pay

## 2020-02-09 DIAGNOSIS — R1013 Epigastric pain: Secondary | ICD-10-CM | POA: Diagnosis not present

## 2020-02-09 DIAGNOSIS — N2889 Other specified disorders of kidney and ureter: Secondary | ICD-10-CM | POA: Diagnosis not present

## 2020-02-09 DIAGNOSIS — R1084 Generalized abdominal pain: Secondary | ICD-10-CM | POA: Insufficient documentation

## 2020-02-09 DIAGNOSIS — I700 Atherosclerosis of aorta: Secondary | ICD-10-CM | POA: Diagnosis not present

## 2020-02-09 DIAGNOSIS — M5137 Other intervertebral disc degeneration, lumbosacral region: Secondary | ICD-10-CM | POA: Diagnosis not present

## 2020-02-09 LAB — POCT I-STAT CREATININE: Creatinine, Ser: 0.9 mg/dL (ref 0.44–1.00)

## 2020-02-09 MED ORDER — IOHEXOL 300 MG/ML  SOLN
100.0000 mL | Freq: Once | INTRAMUSCULAR | Status: AC | PRN
  Administered 2020-02-09: 100 mL via INTRAVENOUS

## 2020-02-16 ENCOUNTER — Encounter: Payer: Self-pay | Admitting: Gastroenterology

## 2020-02-16 ENCOUNTER — Ambulatory Visit (INDEPENDENT_AMBULATORY_CARE_PROVIDER_SITE_OTHER): Payer: Medicare Other | Admitting: Gastroenterology

## 2020-02-16 VITALS — BP 110/60 | HR 52 | Ht 62.0 in | Wt 127.4 lb

## 2020-02-16 DIAGNOSIS — K219 Gastro-esophageal reflux disease without esophagitis: Secondary | ICD-10-CM | POA: Diagnosis not present

## 2020-02-16 DIAGNOSIS — I2510 Atherosclerotic heart disease of native coronary artery without angina pectoris: Secondary | ICD-10-CM | POA: Diagnosis not present

## 2020-02-16 DIAGNOSIS — K224 Dyskinesia of esophagus: Secondary | ICD-10-CM | POA: Diagnosis not present

## 2020-02-16 DIAGNOSIS — K581 Irritable bowel syndrome with constipation: Secondary | ICD-10-CM | POA: Diagnosis not present

## 2020-02-16 MED ORDER — LINACLOTIDE 290 MCG PO CAPS: 290.0000 ug | ORAL_CAPSULE | Freq: Every day | ORAL | 3 refills | Status: DC

## 2020-02-16 MED ORDER — HYOSCYAMINE SULFATE 0.125 MG SL SUBL
0.1250 mg | SUBLINGUAL_TABLET | Freq: Every day | SUBLINGUAL | 3 refills | Status: DC | PRN
Start: 2020-02-16 — End: 2022-03-25

## 2020-02-16 NOTE — Progress Notes (Signed)
[name]    [id number]    [birth year]  Primary Care Physician:Varadarajan, [name], MD  Referring Physician: Leeroy Cha, MD [address] [place],  [place] [address]   Chief complaint: Abdominal pain  HPI:  69 year old female diagnosed with bladder cancer 2016, CAD, s/p TIA with chronic GERD here for follow-up visit.  Last office visit July 2021  She has history of exertional angina.  She also feeling her exercise capacity has decreased, is getting short of breath  Alternating diarrhea and constipation associated with generalized abdominal discomfort Some relief with hyoscyamine, feels it works better than dicyclomine  EGD April 28, 2019: Normal esophagus.  Regular Z-line.  Gastritis biopsies negative for H. Pylori.  TTG IgA antibody negative for celiac  Colonoscopy April 07, 2013 showed internal hemorrhoids otherwise normal exam  Flexible sigmoidoscopy January 22, 2016 for persistent anorectal/pelvic pain was unremarkable other than internal hemorrhoids  Abdominal ultrasound January 27, 2020: Unremarkable  CT abdomen with contrast February 09, 2020: Large colonic stool burden otherwise no acute findings.  Additional chronic findings were noted in the report including emphysema, osteopenia and atherosclerosis  Outpatient Encounter Medications as of 02/16/2020  Medication Sig  . acyclovir (ZOVIRAX) 400 MG tablet Take 400 mg by mouth as needed. As needed  . aspirin 81 MG tablet Take 81 mg by mouth at bedtime.   Marland Kitchen atorvastatin (LIPITOR) 40 MG tablet TAKE 1 TABLET EVERY DAY  . BYSTOLIC 2.5 MG tablet TAKE 1 TABLET EVERY DAY  . cyclobenzaprine (FLEXERIL) 10 MG tablet Take 10 mg by mouth as needed.  . dicyclomine (BENTYL) 10 MG capsule Take 1 capsule (10 mg total) by mouth 2 (two) times daily as needed for spasms.  . Digestive Enzymes (MULTI-ENZYME) TABS Take 1 tablet by mouth as needed.  . diltiazem 2 % GEL Apply 1 application  topically as needed. For rectal spasm  . ezetimibe (ZETIA) 10 MG tablet TAKE 1 TABLET (10 MG TOTAL) BY MOUTH DAILY.  . hyoscyamine (LEVSIN SL) 0.125 MG SL tablet Take 1 tablet (0.125 mg total) by mouth daily as needed. Please schedule office visit.  Marland Kitchen ibuprofen (ADVIL,MOTRIN) 200 MG tablet Take 600 mg by mouth every 6 (six) hours as needed for headache or moderate pain.  Marland Kitchen NEXIUM 40 MG capsule Take 1 capsule (40 mg total) by mouth daily.  . nitroGLYCERIN (NITROSTAT) 0.4 MG SL tablet place 1 tablet under the tongue if needed every 5 minutes for chest pain for 3 doses IF NO RELIEF AFTER 3RD DOSE CALL PRESCRIBER OR 911.  Marland Kitchen oxyCODONE (OXY IR/ROXICODONE) 5 MG immediate release tablet Take 5 mg by mouth as needed for moderate pain.   Marland Kitchen Peppermint Oil (IBGARD) 90 MG CPCR Take 2 capsules by mouth as needed.  . Probiotic Product (PROBIOTIC ACIDOPHILUS) CAPS Take 1 capsule by mouth daily.  . QUEtiapine (SEROQUEL) 25 MG tablet Take 1 tablet (25 mg total) by mouth at bedtime.  . ranolazine (RANEXA) 500 MG 12 hr tablet TAKE 1 TABLET TWICE DAILY  . traMADol (ULTRAM) 50 MG tablet Take 50 mg by mouth every 6 (six) hours as needed.   No facility-administered encounter medications on file as of 02/16/2020.    Allergies as of 02/16/2020 - Review Complete 02/09/2020  Allergen Reaction Noted  . Clindamycin/lincomycin Other (See Comments) 10/05/2011  . Doxycycline Nausea Only and Nausea And Vomiting 10/05/2011  . Epinephrine  08/23/2019  . Escitalopram oxalate Nausea Only 10/05/2011  .  Macrodantin Other (See Comments) 10/05/2011  . Trazodone Nausea Only 10/05/2011  . Trazodone and nefazodone Nausea Only 10/05/2011  . Trimethoprim Other (See Comments) 12/09/2013  . Lincomycin Rash 10/05/2011  . Nitrofurantoin Rash 10/05/2011  . Nitrofurantoin macrocrystal Anxiety     Past Medical History:  Diagnosis Date  . Anxiety    Psychiatry Dr. Stan Head, concerns for memory loss, anxiety  . Bladder tumor   .  Broken heart syndrome   . Cancer ([place])    bladder CA  . Chronic chest pain   . Coronary atherosclerosis CARDIOLOGIST-  DR Daneen Schick   Cath 11/2006 demonstrating moderate obstructive coronary disease with 70-80% septal perforator #1, 50-70% first diagonal, 80% third diagonal, 50% mid and distal LAD & heavy calcification throughout all 3 coronaries. LVF normal.  . Esophageal motility disorder   . Essential hypertension, benign   . Fatty liver 04/28/12  . GERD (gastroesophageal reflux disease)   . H/O esophageal spasm   . Heart murmur   . History of esophageal spasm    takes ranexa  . History of TIA (transient ischemic attack)    2011--  no residuals  . Hyperlipidemia   . IBS (irritable bowel syndrome)    Diarrhea predominent, esophageal spasm severe, GERD - Dr. Elicia Lamp  . Interstitial cystitis    Dr. Gaynelle Arabian  . Memory loss    Psychiatry Dr. Stan Head, concerns for memory loss, anxiety  . PONV (postoperative nausea and vomiting)    AND URINARY RETENTION  . Sensation of pressure in bladder area   . TMJ (temporomandibular joint syndrome)   . Wears glasses     Past Surgical History:  Procedure Laterality Date  . ABDOMINAL HYSTERECTOMY  AGE 47 -- 1989  . APPENDECTOMY  1971   AND OVARIAN CYSTECTOMY  . BENIGN EXCISIONAL BREAST BX  1996  . BREAST EXCISIONAL BIOPSY Right 1992  . CARDIAC CATHETERIZATION  11-28-2006   DR Daneen Schick    moderate cad/  70-80% septal perforator #1, 50-70% first diagonal, 80% third diagonal, 50% mid and distal LAD & heavy calcification throughout all 3 coronaries. LVF normal.  . CARDIAC CATHETERIZATION  03-26-2012   DR Daneen Schick   patent CFX,  RCA,  & pLAD/  mLAD >50% to <70% a focal eccentric region that overlaps the third diagonal/  90% diagonal ostial and unchanged from prior study /   ef 65%  . CARPAL TUNNEL RELEASE    . CYSTO WITH HYDRODISTENSION N/A 01/14/2014   Procedure: CYSTOSCOPY/HYDRODISTENSION/INSERTION OF MARCAINE AND PYRIDUIM INTO  BLADDER/INJECTION OF MARCAINE AND KENALOG into sub trigone space ;  Surgeon: Ailene Rud, MD;  Location: [facility];  Service: Urology;  Laterality: N/A;  . CYSTO/  HYDRODISTENTION/  INSTILLATION THERAPY  03-12-2010  . ESOPHAGOGASTRODUODENOSCOPY  04/30/2012   Procedure: ESOPHAGOGASTRODUODENOSCOPY (EGD);  Surgeon: Lafayette Dragon, MD;  Location: [facility];  Service: Endoscopy;  Laterality: N/A;  . LEFT HEART CATH AND CORONARY ANGIOGRAPHY N/A 02/18/2017   Procedure: LEFT HEART CATH AND CORONARY ANGIOGRAPHY;  Surgeon: Belva Crome, MD;  Location: [facility];  Service: Cardiovascular;  Laterality: N/A;  . LUMBAR [place]  . NEGATIVE SLEEP STUDY  2010  . SHOULDER ARTHROSCOPY Left 12-23-2011  . TRANSTHORACIC ECHOCARDIOGRAM  04-21-2012   GRADE I DIASTOLIC DYSFUNCTION/  EF 60-65%/  MILD MR  &  TR  . TRANSURETHRAL RESECTION OF BLADDER TUMOR Right 01/03/2015   Procedure: TRANSURETHRAL RESECTION OF BLADDER TUMOR (TURBT);  Surgeon: Pierre Bali  Gaynelle Arabian, MD;  Location: [facility];  Service: Urology;  Laterality: Right;    Family History  Problem Relation Age of Onset  . Heart disease Mother   . Colon polyps Mother   . Alzheimer's disease Mother   . Dementia Mother   . CAD Mother   . Heart disease Father   . Diverticulosis Father   . CAD Father        in his 17s  . Heart disease Brother   . Osteoarthritis Other        Multiple family members  . Obesity Other        Multiple family members  . Depression Other        Multiple family members  . Breast cancer Sister 46       Pam  . Colon cancer Neg Hx   . Esophageal cancer Neg Hx   . Stomach cancer Neg Hx   . Rectal cancer Neg Hx     Social History   Socioeconomic History  . Marital status: Married    Spouse name: Not on file  . Number of children: 2  . Years of education: Not on file  . Highest education level: Not on file  Occupational History  . Occupation: caregiver    Tobacco Use  . Smoking status: Former Smoker    Years: 10.00    Types: Cigarettes    Quit date: 06/10/1973    Years since quitting: 46.7  . Smokeless tobacco: Never Used  Vaping Use  . Vaping Use: Never used  Substance and Sexual Activity  . Alcohol use: Yes    Alcohol/week: 2.0 standard drinks    Types: 2 Glasses of wine per week    Comment: social  . Drug use: No  . Sexual activity: Yes  Other Topics Concern  . Not on file  Social History Narrative  . Not on file   Social Determinants of Health   Financial Resource Strain:   . Difficulty of Paying Living Expenses: Not on file  Food Insecurity:   . Worried About Charity fundraiser in the Last Year: Not on file  . Ran Out of Food in the Last Year: Not on file  Transportation Needs:   . Lack of Transportation (Medical): Not on file  . Lack of Transportation (Non-Medical): Not on file  Physical Activity:   . Days of Exercise per Week: Not on file  . Minutes of Exercise per Session: Not on file  Stress:   . Feeling of Stress : Not on file  Social Connections:   . Frequency of Communication with Friends and Family: Not on file  . Frequency of Social Gatherings with Friends and Family: Not on file  . Attends Religious Services: Not on file  . Active Member of Clubs or Organizations: Not on file  . Attends Archivist Meetings: Not on file  . Marital Status: Not on file  Intimate Partner Violence:   . Fear of Current or Ex-Partner: Not on file  . Emotionally Abused: Not on file  . Physically Abused: Not on file  . Sexually Abused: Not on file      Review of systems: All other review of systems negative except as mentioned in the HPI.   Physical Exam: Vitals:   02/16/20 0842  BP: 110/60  Pulse: (!) 52  SpO2: 99%   Body mass index is 23.3 kg/m. Gen:      No acute distress HEENT:  sclera anicteric  Abd:      soft, non-tender; no palpable masses, no distension Ext:    No edema Neuro: alert and  oriented x 3 Psych: normal mood and affect  Data Reviewed:  Reviewed labs, radiology imaging, old records and pertinent past GI work up   Assessment and Plan/Recommendations:  69 year old very pleasant female with complaints of epigastric abdominal discomfort, irregular bowel habits with alternating constipation and diarrhea  She has large stool burden based on CT abdomen pelvis  Irritable bowel syndrome predominant constipation with alternating diarrhea  Start Linzess 290 mcg daily Continue with high-fiber diet and increased water intake  Abdominal cramping: Use hyoscyamine up to 3 times daily as needed  Also provided samples for IBgard, can use 1 capsule up to 3 times daily as needed for IBS symptoms  Use FD guard 1 capsule up to 3 times daily as needed for dyspepsia  She was noted to have changes in bilateral lower lungs suggestive of emphysema, complaints of shortness of breath.  Advised patient to follow-up with PMD and consider referral to pulmonary for further evaluation  Return in 3 months or sooner if needed  This visit required 40 minutes of patient care (this includes precharting, chart review, review of results, face-to-face time used for counseling as well as treatment plan and follow-up. The patient was provided an opportunity to ask questions and all were answered. The patient agreed with the plan and demonstrated an understanding of the instructions.  Damaris Hippo , MD    CC: Leeroy Cha

## 2020-02-16 NOTE — Patient Instructions (Signed)
We have sent Linzess to your pharmacy  Use IB Gard/FDgard three times a day as needed  We refilled hyoscyamine and sent it in to your pharmacy  Follow up in 3 months  I appreciate the  opportunity to care for you  Thank You   Harl Bowie , MD

## 2020-02-25 DIAGNOSIS — L818 Other specified disorders of pigmentation: Secondary | ICD-10-CM | POA: Diagnosis not present

## 2020-02-25 DIAGNOSIS — D2262 Melanocytic nevi of left upper limb, including shoulder: Secondary | ICD-10-CM | POA: Diagnosis not present

## 2020-02-25 DIAGNOSIS — D225 Melanocytic nevi of trunk: Secondary | ICD-10-CM | POA: Diagnosis not present

## 2020-02-25 DIAGNOSIS — Z85828 Personal history of other malignant neoplasm of skin: Secondary | ICD-10-CM | POA: Diagnosis not present

## 2020-02-25 DIAGNOSIS — Z86018 Personal history of other benign neoplasm: Secondary | ICD-10-CM | POA: Diagnosis not present

## 2020-02-25 DIAGNOSIS — H019 Unspecified inflammation of eyelid: Secondary | ICD-10-CM | POA: Diagnosis not present

## 2020-02-25 DIAGNOSIS — L578 Other skin changes due to chronic exposure to nonionizing radiation: Secondary | ICD-10-CM | POA: Diagnosis not present

## 2020-02-25 DIAGNOSIS — L821 Other seborrheic keratosis: Secondary | ICD-10-CM | POA: Diagnosis not present

## 2020-02-25 DIAGNOSIS — D2372 Other benign neoplasm of skin of left lower limb, including hip: Secondary | ICD-10-CM | POA: Diagnosis not present

## 2020-02-25 DIAGNOSIS — D2271 Melanocytic nevi of right lower limb, including hip: Secondary | ICD-10-CM | POA: Diagnosis not present

## 2020-03-04 NOTE — Progress Notes (Signed)
Cardiology Office Note:    Date:  03/06/2020   ID:  [name], DOB [birth year], MRN [id number]  PCP:  [name] Cha, MD  Cardiologist:  [name] Grooms, MD   Referring MD: [name],*   Chief Complaint  Patient presents with  . Coronary Artery Disease  . Advice Only    Emphysema    History of Present Illness:    [name] is a 69 y.o. female with a hx of hyperlipidemia, severe diffuse CAD, class III exertional angina, and anxiety disorder.  [name] gets extremely fatigued with 30 minutes of physical activity.  She goes to the gym 4 or 5 times per week.  She is relatively active at home.  After she exercises, she is pretty wiped out.  Specifically, she is not complaining of chest discomfort.  She occasionally gets chest tightness that occurs randomly.  A drink of water tends to make it go away.  She gets no chest discomfort with physical activity.  Past Medical History:  Diagnosis Date  . Anxiety    Psychiatry Dr. Stan [name], concerns for memory loss, anxiety  . Bladder tumor   . Broken heart syndrome   . 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 Chronic chest pain   . Coronary atherosclerosis CARDIOLOGIST-  DR [name]   Cath 11/2006 demonstrating moderate obstructive coronary disease with 70-80% septal perforator #1, 50-70% first diagonal, 80% third diagonal, 50% mid and distal LAD & heavy calcification throughout all 3 coronaries. LVF normal.  . Esophageal motility disorder   . Essential hypertension, benign   . Fatty liver 04/28/12  . GERD (gastroesophageal reflux disease)   . H/O esophageal spasm   . Heart murmur   . History of esophageal spasm    takes ranexa  . History of TIA (transient ischemic attack)    2011--  no residuals  . Hyperlipidemia   . IBS (irritable bowel syndrome)    Diarrhea predominent, esophageal spasm severe, GERD - Dr. Elicia [name]  . Interstitial cystitis    Dr. Gaynelle [name]  . Memory loss    Psychiatry Dr. Stan [name], concerns for memory loss, anxiety  . PONV (postoperative nausea and vomiting)    AND URINARY RETENTION  . Sensation of pressure in bladder area   . TMJ (temporomandibular joint syndrome)   . Wears glasses     Past Surgical History:  Procedure Laterality Date  . ABDOMINAL HYSTERECTOMY  AGE 43 -- 1989  . APPENDECTOMY  1971   AND OVARIAN CYSTECTOMY  . BENIGN EXCISIONAL BREAST BX  1996  . BREAST EXCISIONAL BIOPSY Right 1992  . CARDIAC CATHETERIZATION  11-28-2006   DR [name]    moderate cad/  70-80% septal perforator #1, 50-70% first diagonal, 80% third diagonal, 50% mid and distal LAD & heavy calcification throughout all 3 coronaries. LVF normal.  . CARDIAC CATHETERIZATION  03-26-2012   DR [name]   patent CFX,  RCA,  & pLAD/  mLAD >50% to <70% a focal eccentric region that overlaps the third diagonal/  90% diagonal ostial and unchanged from prior study /   ef 65%  . CARPAL TUNNEL RELEASE Bilateral   . CYSTO WITH HYDRODISTENSION N/A 01/14/2014   Procedure: CYSTOSCOPY/HYDRODISTENSION/INSERTION OF MARCAINE AND PYRIDUIM INTO BLADDER/INJECTION OF MARCAINE AND KENALOG into sub trigone space ;  Surgeon: [name] Rud, MD;  Location: [facility];  Service: Urology;  Laterality: N/A;  . CYSTO/  HYDRODISTENTION/  INSTILLATION THERAPY  03-12-2010  .  ESOPHAGOGASTRODUODENOSCOPY  04/30/2012   Procedure: ESOPHAGOGASTRODUODENOSCOPY (EGD);  Surgeon: [name] Dragon, MD;  Location: [facility];  Service: Endoscopy;  Laterality: N/A;  . LEFT HEART CATH AND CORONARY ANGIOGRAPHY N/A 02/18/2017   Procedure: LEFT HEART CATH AND CORONARY ANGIOGRAPHY;  Surgeon: [name] Crome, MD;  Location: [facility];  Service: Cardiovascular;  Laterality: N/A;  . LUMBAR [place]  . NEGATIVE SLEEP STUDY  2010  . SHOULDER ARTHROSCOPY Left 12-23-2011  . SHOULDER SURGERY Right   . TRANSTHORACIC ECHOCARDIOGRAM  04-21-2012   GRADE I DIASTOLIC DYSFUNCTION/  EF 60-65%/  MILD MR  &   TR  . TRANSURETHRAL RESECTION OF BLADDER TUMOR Right 01/03/2015   Procedure: TRANSURETHRAL RESECTION OF BLADDER TUMOR (TURBT);  Surgeon: [name] Clines, MD;  Location: [facility];  Service: Urology;  Laterality: Right;    Current Medications: Current Meds  Medication Sig  . acyclovir (ZOVIRAX) 400 MG tablet Take 400 mg by mouth as needed. As needed  . aspirin 81 MG tablet Take 81 mg by mouth at bedtime.   Marland Kitchen atorvastatin (LIPITOR) 40 MG tablet Take 1 tablet (40 mg total) by mouth daily.  . cyclobenzaprine (FLEXERIL) 10 MG tablet Take 10 mg by mouth as needed.  . dicyclomine (BENTYL) 10 MG capsule Take 1 capsule (10 mg total) by mouth 2 (two) times daily as needed for spasms.  . Digestive Enzymes (MULTI-ENZYME) TABS Take 1 tablet by mouth as needed.  . diltiazem 2 % GEL Apply 1 application topically as needed. For rectal spasm  . ezetimibe (ZETIA) 10 MG tablet Take 1 tablet (10 mg total) by mouth daily.  . hyoscyamine (LEVSIN SL) 0.125 MG SL tablet Take 1 tablet (0.125 mg total) by mouth daily as needed. Please schedule office visit.  Marland Kitchen ibuprofen (ADVIL,MOTRIN) 200 MG tablet Take 600 mg by mouth every 6 (six) hours as needed for headache or moderate pain.  Marland Kitchen linaclotide (LINZESS) 290 MCG CAPS capsule Take 1 capsule (290 mcg total) by mouth daily before breakfast.  . nebivolol (BYSTOLIC) 2.5 MG tablet Take 1 tablet (2.5 mg total) by mouth daily.  Marland Kitchen NEXIUM 40 MG capsule Take 1 capsule (40 mg total) by mouth daily.  . nitroGLYCERIN (NITROSTAT) 0.4 MG SL tablet place 1 tablet under the tongue if needed every 5 minutes for chest pain for 3 doses IF NO RELIEF AFTER 3RD DOSE CALL PRESCRIBER OR 911.  Marland Kitchen oxyCODONE (OXY IR/ROXICODONE) 5 MG immediate release tablet Take 5 mg by mouth as needed for moderate pain.   Marland Kitchen Peppermint Oil (IBGARD) 90 MG CPCR Take 2 capsules by mouth as needed.  . Probiotic Product (PROBIOTIC ACIDOPHILUS) CAPS Take 1 capsule by mouth daily.  . QUEtiapine  (SEROQUEL) 25 MG tablet Take 1 tablet (25 mg total) by mouth at bedtime.  . ranolazine (RANEXA) 500 MG 12 hr tablet Take 1 tablet (500 mg total) by mouth 2 (two) times daily.  . traMADol (ULTRAM) 50 MG tablet Take 50 mg by mouth every 6 (six) hours as needed.  . [DISCONTINUED] atorvastatin (LIPITOR) 40 MG tablet TAKE 1 TABLET EVERY DAY  . [DISCONTINUED] BYSTOLIC 2.5 MG tablet TAKE 1 TABLET EVERY DAY  . [DISCONTINUED] ezetimibe (ZETIA) 10 MG tablet TAKE 1 TABLET (10 MG TOTAL) BY MOUTH DAILY.  . [DISCONTINUED] ranolazine (RANEXA) 500 MG 12 hr tablet TAKE 1 TABLET TWICE DAILY     Allergies:   Clindamycin/lincomycin, Doxycycline, Epinephrine, Escitalopram oxalate, Macrodantin, Trazodone, Trazodone and nefazodone, Trimethoprim, Lincomycin, Nitrofurantoin, and Nitrofurantoin macrocrystal  Social History   Socioeconomic History  . Marital status: Married    Spouse name: Not on file  . Number of children: 2  . Years of education: Not on file  . Highest education level: Not on file  Occupational History  . Occupation: caregiver  Tobacco Use  . Smoking status: Former Smoker    Years: 10.00    Types: Cigarettes    Quit date: 06/10/1973    Years since quitting: 46.7  . Smokeless tobacco: Never Used  Vaping Use  . Vaping Use: Never used  Substance and Sexual Activity  . Alcohol use: Yes    Alcohol/week: 2.0 standard drinks    Types: 2 Glasses of wine per week    Comment: social  . Drug use: No  . Sexual activity: Yes  Other Topics Concern  . Not on file  Social History Narrative  . Not on file   Social Determinants of Health   Financial Resource Strain:   . Difficulty of Paying Living Expenses: Not on file  Food Insecurity:   . Worried About Charity fundraiser in the Last Year: Not on file  . Ran Out of Food in the Last Year: Not on file  Transportation Needs:   . Lack of Transportation (Medical): Not on file  . Lack of Transportation (Non-Medical): Not on file  Physical  Activity:   . Days of Exercise per Week: Not on file  . Minutes of Exercise per Session: Not on file  Stress:   . Feeling of Stress : Not on file  Social Connections:   . Frequency of Communication with Friends and Family: Not on file  . Frequency of Social Gatherings with Friends and Family: Not on file  . Attends Religious Services: Not on file  . Active Member of Clubs or Organizations: Not on file  . Attends Archivist Meetings: Not on file  . Marital Status: Not on file     Family History: The patient's family history includes Alzheimer's disease in her mother; Breast cancer (age of onset: 22) in her sister; CAD in her father and mother; Colon polyps in her mother; Dementia in her mother; Depression in an other family member; Diverticulosis in her father; Heart disease in her brother, father, and mother; Obesity in an other family member; Osteoarthritis in an other family member. There is no history of Colon cancer, Esophageal cancer, Stomach cancer, or Rectal cancer.  ROS:   Please see the history of present illness.    Concerned about the diagnosis of emphysema.  This was mentioned on an abdominal CT scan and also a chest x-ray dating back to 2018.  She did smoke between the ages of 76 and 69 but none since then.  Concerned about her family and COVID-19 as her daughter and grandchildren are antivaccine.  All other systems reviewed and are negative.  EKGs/Labs/Other Studies Reviewed:    The following studies were reviewed today: No new data  EKG:  EKG sinus bradycardia with poor R wave progression V1 through V4.  Nonspecific ST abnormality.  Overall relatively normal in appearance.  When compared to 04/21/2019, no significant changes noted.  Recent Labs: 04/20/2019: TSH 1.72 12/24/2019: ALT 17; BUN 13; Hemoglobin 11.9; Platelets 267.0; Potassium 4.3; Sodium 136 02/09/2020: Creatinine, Ser 0.90  Recent Lipid Panel    Component Value Date/Time   CHOL 118 11/03/2018 1029    TRIG 90 11/03/2018 1029   HDL 50 11/03/2018 1029   CHOLHDL 2.4 11/03/2018 1029  CHOLHDL 2.4 02/18/2017 0824   VLDL 17 02/18/2017 0824   LDLCALC 50 11/03/2018 1029    Physical Exam:    VS:  BP 132/64   Pulse (!) 59   Ht 5' 2.5" (1.588 m)   Wt 131 lb 6.4 oz (59.6 kg)   SpO2 99%   BMI 23.65 kg/m     Wt Readings from Last 3 Encounters:  03/06/20 131 lb 6.4 oz (59.6 kg)  02/16/20 127 lb 6.4 oz (57.8 kg)  12/24/19 128 lb 6 oz (58.2 kg)     GEN: Appears younger than stated age. No acute distress HEENT: Normal NECK: No JVD. LYMPHATICS: No lymphadenopathy CARDIAC:  RRR without murmur, gallop, or edema. VASCULAR:  Normal Pulses except posterior tibial pulses are not palpable.. No bruits. RESPIRATORY:  Clear to auscultation without rales, wheezing or rhonchi  ABDOMEN: Soft, non-tender, non-distended, No pulsatile mass, MUSCULOSKELETAL: No deformity  SKIN: Warm and dry NEUROLOGIC:  Alert and oriented x 3 PSYCHIATRIC:  Normal affect   ASSESSMENT:    1. Coronary artery disease involving native coronary artery of native heart without angina pectoris   2. Essential hypertension   3. Other hyperlipidemia   4. Cerebral artery occlusion with cerebral infarction ([place])   5. Educated about COVID-19 virus infection   6. Pulmonary emphysema, unspecified emphysema type ([place])    PLAN:    In order of problems listed above:  1. Secondary prevention discussed.  She is doing a great job of maintaining physical fitness.  Continue ranolazine, as needed sublingual nitroglycerin, nebivolol, and exercise. 2. Excellent blood pressure control.  Continue nebivolol 2.5 mg daily. 3. High intensity atorvastatin 40 mg/day and Zetia 10 mg/day will be continued.  She needs a lipid panel prior to the end of the year.  LDL was 56 from performed in December. 4. No neurological complaints. 5. She is vaccinated.  She is concerned that she cannot convince her daughter and grandchildren to get  vaccinated. 6. Pulmonary function test with and without bronchodilator therapy will be performed to determine if there is functional evidence of emphysema as suggested on imaging.  Refer to pulmonology if abnormalities are identified.  Overall education and awareness concerning primary/secondary risk prevention was discussed in detail: LDL less than 70, hemoglobin A1c less than 7, blood pressure target less than 130/80 mmHg, >150 minutes of moderate aerobic activity per week, avoidance of smoking, weight control (via diet and exercise), and continued surveillance/management of/for obstructive sleep apnea.    Medication Adjustments/Labs and Tests Ordered: Current medicines are reviewed at length with the patient today.  Concerns regarding medicines are outlined above.  Orders Placed This Encounter  Procedures  . EKG 12-Lead  . Pulmonary function test   Meds ordered this encounter  Medications  . atorvastatin (LIPITOR) 40 MG tablet    Sig: Take 1 tablet (40 mg total) by mouth daily.    Dispense:  90 tablet    Refill:  3  . nebivolol (BYSTOLIC) 2.5 MG tablet    Sig: Take 1 tablet (2.5 mg total) by mouth daily.    Dispense:  90 tablet    Refill:  3  . ezetimibe (ZETIA) 10 MG tablet    Sig: Take 1 tablet (10 mg total) by mouth daily.    Dispense:  90 tablet    Refill:  3  . ranolazine (RANEXA) 500 MG 12 hr tablet    Sig: Take 1 tablet (500 mg total) by mouth 2 (two) times daily.    Dispense:  180 tablet    Refill:  3    Patient Instructions  Medication Instructions:  Your physician recommends that you continue on your current medications as directed. Please refer to the Current Medication list given to you today.  *If you need a refill on your cardiac medications before your next appointment, please call your pharmacy*   Lab Work: None If you have labs (blood work) drawn today and your tests are completely normal, you will receive your results only by: Marland Kitchen MyChart Message (if you  have MyChart) OR . A paper copy in the mail If you have any lab test that is abnormal or we need to change your treatment, we will call you to review the results.   Testing/Procedures: Your physician has recommended that you have a pulmonary function test. Pulmonary Function Tests are a group of tests that measure how well air moves in and out of your lungs.    Follow-Up: At [facility], you and your health needs are our priority.  As part of our continuing mission to provide you with exceptional heart care, we have created designated Provider Care Teams.  These Care Teams include your primary Cardiologist (physician) and Advanced Practice Providers (APPs -  Physician Assistants and Nurse Practitioners) who all work together to provide you with the care you need, when you need it.  We recommend signing up for the patient portal called "MyChart".  Sign up information is provided on this After Visit Summary.  MyChart is used to connect with patients for Virtual Visits (Telemedicine).  Patients are able to view lab/test results, encounter notes, upcoming appointments, etc.  Non-urgent messages can be sent to your provider as well.   To learn more about what you can do with MyChart, go to [email or web address].    Your next appointment:   12 month(s)  The format for your next appointment:   In Person  Provider:   You may see [name] Grooms, MD or one of the following Advanced Practice Providers on your designated Care Team:    Truitt Merle, NP  Cecilie Kicks, NP  Kathyrn Drown, NP    Other Instructions      Signed, [name] Grooms, MD  03/06/2020 9:43 AM    [place]

## 2020-03-06 ENCOUNTER — Encounter: Payer: Self-pay | Admitting: Interventional Cardiology

## 2020-03-06 ENCOUNTER — Ambulatory Visit (INDEPENDENT_AMBULATORY_CARE_PROVIDER_SITE_OTHER): Payer: Medicare Other | Admitting: Interventional Cardiology

## 2020-03-06 ENCOUNTER — Other Ambulatory Visit: Payer: Self-pay

## 2020-03-06 VITALS — BP 132/64 | HR 59 | Ht 62.5 in | Wt 131.4 lb

## 2020-03-06 DIAGNOSIS — I6350 Cerebral infarction due to unspecified occlusion or stenosis of unspecified cerebral artery: Secondary | ICD-10-CM

## 2020-03-06 DIAGNOSIS — E7849 Other hyperlipidemia: Secondary | ICD-10-CM | POA: Diagnosis not present

## 2020-03-06 DIAGNOSIS — J439 Emphysema, unspecified: Secondary | ICD-10-CM | POA: Diagnosis not present

## 2020-03-06 DIAGNOSIS — I2510 Atherosclerotic heart disease of native coronary artery without angina pectoris: Secondary | ICD-10-CM | POA: Diagnosis not present

## 2020-03-06 DIAGNOSIS — Z7189 Other specified counseling: Secondary | ICD-10-CM | POA: Diagnosis not present

## 2020-03-06 DIAGNOSIS — I10 Essential (primary) hypertension: Secondary | ICD-10-CM | POA: Diagnosis not present

## 2020-03-06 MED ORDER — ATORVASTATIN CALCIUM 40 MG PO TABS: 40.0000 mg | ORAL_TABLET | Freq: Every day | ORAL | 3 refills | Status: DC

## 2020-03-06 MED ORDER — RANOLAZINE ER 500 MG PO TB12: 500.0000 mg | ORAL_TABLET | Freq: Two times a day (BID) | ORAL | 3 refills | Status: DC

## 2020-03-06 MED ORDER — NEBIVOLOL HCL 2.5 MG PO TABS
2.5000 mg | ORAL_TABLET | Freq: Every day | ORAL | 3 refills | Status: DC
Start: 2020-03-06 — End: 2021-04-12

## 2020-03-06 MED ORDER — EZETIMIBE 10 MG PO TABS: 10.0000 mg | ORAL_TABLET | Freq: Every day | ORAL | 3 refills | Status: DC

## 2020-03-06 NOTE — Patient Instructions (Signed)
Medication Instructions:  Your physician recommends that you continue on your current medications as directed. Please refer to the Current Medication list given to you today.  *If you need a refill on your cardiac medications before your next appointment, please call your pharmacy*   Lab Work: None If you have labs (blood work) drawn today and your tests are completely normal, you will receive your results only by: Marland Kitchen MyChart Message (if you have MyChart) OR . A paper copy in the mail If you have any lab test that is abnormal or we need to change your treatment, we will call you to review the results.   Testing/Procedures: Your physician has recommended that you have a pulmonary function test. Pulmonary Function Tests are a group of tests that measure how well air moves in and out of your lungs.    Follow-Up: At [facility], you and your health needs are our priority.  As part of our continuing mission to provide you with exceptional heart care, we have created designated Provider Care Teams.  These Care Teams include your primary Cardiologist (physician) and Advanced Practice Providers (APPs -  Physician Assistants and Nurse Practitioners) who all work together to provide you with the care you need, when you need it.  We recommend signing up for the patient portal called "MyChart".  Sign up information is provided on this After Visit Summary.  MyChart is used to connect with patients for Virtual Visits (Telemedicine).  Patients are able to view lab/test results, encounter notes, upcoming appointments, etc.  Non-urgent messages can be sent to your provider as well.   To learn more about what you can do with MyChart, go to [email or web address].    Your next appointment:   12 month(s)  The format for your next appointment:   In Person  Provider:   You may see Sinclair Grooms, MD or one of the following Advanced Practice Providers on your designated Care Team:    Truitt Merle,  NP  Cecilie Kicks, NP  Kathyrn Drown, NP    Other Instructions

## 2020-03-16 DIAGNOSIS — H2513 Age-related nuclear cataract, bilateral: Secondary | ICD-10-CM | POA: Diagnosis not present

## 2020-03-17 ENCOUNTER — Telehealth: Payer: Self-pay | Admitting: Interventional Cardiology

## 2020-03-17 NOTE — Telephone Encounter (Signed)
New Message:    Pt said she went to her eye doctor yesterday and her eyes were dialated. Every since this happen, heart is feeling strange.  Pt c/o of Chest Pain: STAT if CP now or developed within 24 hours  1. Are you having CP right now? Chest pressure,, can feel it in her back also-right now  2. Are you experiencing any other symptoms (ex. SOB, nausea, vomiting, sweating)?  Fast and pounding in her heart  3. How long have you been experiencing CP?started yesterday after her eyes were dialated  4. Is your CP continuous or coming and going? Stays  5. Have you taken Nitroglycerin?  No- pt would like to be seen today by soembody ?

## 2020-03-17 NOTE — Telephone Encounter (Signed)
Spoke with the patient who states she had her eyes dilated yesterday around 2 pm and her heart has been feeling strange ever since. She denies any CP, SOB or swelling. She reports feeling like her heart is pounding. Recent BP 132/84 HR 58. Advised patient that these vital signs are normal and similar to her last OV vitals. Encouraged the patient to call her eye doctor back and follow up with him. She states that she was told this is a side effect of eye dilation. Advised the patient to give [place] a call with any further concerns.

## 2020-03-29 ENCOUNTER — Ambulatory Visit (INDEPENDENT_AMBULATORY_CARE_PROVIDER_SITE_OTHER): Payer: Medicare Other | Admitting: Internal Medicine

## 2020-03-29 ENCOUNTER — Other Ambulatory Visit: Payer: Self-pay

## 2020-03-29 DIAGNOSIS — J439 Emphysema, unspecified: Secondary | ICD-10-CM | POA: Diagnosis not present

## 2020-03-29 LAB — PULMONARY FUNCTION TEST
DL/VA % pred: 92 %
DL/VA: 3.87 ml/min/mmHg/L
DLCO cor % pred: 88 %
DLCO cor: 16.54 ml/min/mmHg
DLCO unc % pred: 88 %
DLCO unc: 16.54 ml/min/mmHg
FEF 25-75 Post: 2.61 L/sec
FEF 25-75 Pre: 1.87 L/sec
FEF2575-%Change-Post: 39 %
FEF2575-%Pred-Post: 136 %
FEF2575-%Pred-Pre: 98 %
FEV1-%Change-Post: 6 %
FEV1-%Pred-Post: 106 %
FEV1-%Pred-Pre: 100 %
FEV1-Post: 2.34 L
FEV1-Pre: 2.19 L
FEV1FVC-%Change-Post: 4 %
FEV1FVC-%Pred-Pre: 101 %
FEV6-%Change-Post: 1 %
FEV6-%Pred-Post: 104 %
FEV6-%Pred-Pre: 102 %
FEV6-Post: 2.87 L
FEV6-Pre: 2.82 L
FEV6FVC-%Pred-Post: 104 %
FEV6FVC-%Pred-Pre: 104 %
FVC-%Change-Post: 1 %
FVC-%Pred-Post: 99 %
FVC-%Pred-Pre: 97 %
FVC-Post: 2.87 L
FVC-Pre: 2.82 L
Post FEV1/FVC ratio: 81 %
Post FEV6/FVC ratio: 100 %
Pre FEV1/FVC ratio: 78 %
Pre FEV6/FVC Ratio: 100 %
RV % pred: 115 %
RV: 2.39 L
TLC % pred: 107 %
TLC: 5.18 L

## 2020-03-29 NOTE — Progress Notes (Signed)
PFT done today. 

## 2020-03-30 ENCOUNTER — Telehealth: Payer: Self-pay | Admitting: Gastroenterology

## 2020-03-30 NOTE — Telephone Encounter (Signed)
Pt states that Linzess works great for her. she feels like a "different person." She stated that Dr. Silverio Decamp put her on that for one monthe, pt wants to know if she needs to keep taking it the way that she has been to. SHe is not sure of Dr. Woodward Ku plan is long term. Pls call her.

## 2020-03-30 NOTE — Telephone Encounter (Signed)
Agree with trying lower dose of Linzess 121mcg daily instead of 235mcg daily. Thank you

## 2020-03-30 NOTE — Telephone Encounter (Signed)
Spoke with patient. She is "doing the happy dance." She does feel better since she is able to empty her bowels daily. However, she has to plan for about 4 hours until the Linzess stops making her go back to the bathroom again and again. She is unable to go out in the mornings because of that. She is hoping that she can do something different. At this point, she does not want to take it every day. It is affecting her ability to have any activities other than planning for the bathroom for extended periods of time. The bowel movements are very soft unformed almost pudding in consistency.

## 2020-03-30 NOTE — Telephone Encounter (Signed)
Samples provided. She will stay in touch with [place] about her response.

## 2020-04-04 DIAGNOSIS — Z23 Encounter for immunization: Secondary | ICD-10-CM | POA: Diagnosis not present

## 2020-04-07 DIAGNOSIS — E7800 Pure hypercholesterolemia, unspecified: Secondary | ICD-10-CM | POA: Diagnosis not present

## 2020-04-07 DIAGNOSIS — E785 Hyperlipidemia, unspecified: Secondary | ICD-10-CM | POA: Diagnosis not present

## 2020-04-07 DIAGNOSIS — C689 Malignant neoplasm of urinary organ, unspecified: Secondary | ICD-10-CM | POA: Diagnosis not present

## 2020-04-07 DIAGNOSIS — I6350 Cerebral infarction due to unspecified occlusion or stenosis of unspecified cerebral artery: Secondary | ICD-10-CM | POA: Diagnosis not present

## 2020-04-07 DIAGNOSIS — I2510 Atherosclerotic heart disease of native coronary artery without angina pectoris: Secondary | ICD-10-CM | POA: Diagnosis not present

## 2020-05-10 DIAGNOSIS — M19072 Primary osteoarthritis, left ankle and foot: Secondary | ICD-10-CM | POA: Diagnosis not present

## 2020-05-10 DIAGNOSIS — M19071 Primary osteoarthritis, right ankle and foot: Secondary | ICD-10-CM | POA: Diagnosis not present

## 2020-05-12 ENCOUNTER — Other Ambulatory Visit: Payer: Self-pay | Admitting: Interventional Cardiology

## 2020-05-18 DIAGNOSIS — M25562 Pain in left knee: Secondary | ICD-10-CM | POA: Diagnosis not present

## 2020-05-18 DIAGNOSIS — M2392 Unspecified internal derangement of left knee: Secondary | ICD-10-CM | POA: Diagnosis not present

## 2020-05-18 DIAGNOSIS — M1712 Unilateral primary osteoarthritis, left knee: Secondary | ICD-10-CM | POA: Diagnosis not present

## 2020-05-19 DIAGNOSIS — C689 Malignant neoplasm of urinary organ, unspecified: Secondary | ICD-10-CM | POA: Diagnosis not present

## 2020-05-19 DIAGNOSIS — K219 Gastro-esophageal reflux disease without esophagitis: Secondary | ICD-10-CM | POA: Diagnosis not present

## 2020-05-19 DIAGNOSIS — J45909 Unspecified asthma, uncomplicated: Secondary | ICD-10-CM | POA: Diagnosis not present

## 2020-05-19 DIAGNOSIS — G4700 Insomnia, unspecified: Secondary | ICD-10-CM | POA: Diagnosis not present

## 2020-05-19 DIAGNOSIS — E785 Hyperlipidemia, unspecified: Secondary | ICD-10-CM | POA: Diagnosis not present

## 2020-05-19 DIAGNOSIS — I2510 Atherosclerotic heart disease of native coronary artery without angina pectoris: Secondary | ICD-10-CM | POA: Diagnosis not present

## 2020-05-22 DIAGNOSIS — E785 Hyperlipidemia, unspecified: Secondary | ICD-10-CM | POA: Diagnosis not present

## 2020-05-22 DIAGNOSIS — Z0000 Encounter for general adult medical examination without abnormal findings: Secondary | ICD-10-CM | POA: Diagnosis not present

## 2020-05-22 DIAGNOSIS — M797 Fibromyalgia: Secondary | ICD-10-CM | POA: Diagnosis not present

## 2020-05-22 DIAGNOSIS — Z8673 Personal history of transient ischemic attack (TIA), and cerebral infarction without residual deficits: Secondary | ICD-10-CM | POA: Diagnosis not present

## 2020-05-22 DIAGNOSIS — Z1389 Encounter for screening for other disorder: Secondary | ICD-10-CM | POA: Diagnosis not present

## 2020-05-22 DIAGNOSIS — I2510 Atherosclerotic heart disease of native coronary artery without angina pectoris: Secondary | ICD-10-CM | POA: Diagnosis not present

## 2020-05-22 DIAGNOSIS — K219 Gastro-esophageal reflux disease without esophagitis: Secondary | ICD-10-CM | POA: Diagnosis not present

## 2020-05-22 DIAGNOSIS — J45909 Unspecified asthma, uncomplicated: Secondary | ICD-10-CM | POA: Diagnosis not present

## 2020-05-22 DIAGNOSIS — E2839 Other primary ovarian failure: Secondary | ICD-10-CM | POA: Diagnosis not present

## 2020-05-22 DIAGNOSIS — K580 Irritable bowel syndrome with diarrhea: Secondary | ICD-10-CM | POA: Diagnosis not present

## 2020-06-01 ENCOUNTER — Other Ambulatory Visit: Payer: Self-pay | Admitting: Internal Medicine

## 2020-06-01 DIAGNOSIS — E2839 Other primary ovarian failure: Secondary | ICD-10-CM

## 2020-06-05 ENCOUNTER — Other Ambulatory Visit: Payer: Self-pay

## 2020-06-05 ENCOUNTER — Ambulatory Visit
Admission: RE | Admit: 2020-06-05 | Discharge: 2020-06-05 | Disposition: A | Discharge status: Home / Self Care | Payer: Medicare Other | Source: Ambulatory Visit | Attending: Internal Medicine | Admitting: Internal Medicine

## 2020-06-05 DIAGNOSIS — E2839 Other primary ovarian failure: Secondary | ICD-10-CM

## 2020-06-05 DIAGNOSIS — M85852 Other specified disorders of bone density and structure, left thigh: Secondary | ICD-10-CM | POA: Diagnosis not present

## 2020-06-05 DIAGNOSIS — Z780 Asymptomatic menopausal state: Secondary | ICD-10-CM | POA: Diagnosis not present

## 2020-06-07 DIAGNOSIS — M19071 Primary osteoarthritis, right ankle and foot: Secondary | ICD-10-CM | POA: Diagnosis not present

## 2020-06-07 DIAGNOSIS — M19072 Primary osteoarthritis, left ankle and foot: Secondary | ICD-10-CM | POA: Diagnosis not present

## 2020-06-10 HISTORY — PX: EYE SURGERY: SHX253

## 2020-06-23 DIAGNOSIS — M8580 Other specified disorders of bone density and structure, unspecified site: Secondary | ICD-10-CM | POA: Diagnosis not present

## 2020-06-26 DIAGNOSIS — K219 Gastro-esophageal reflux disease without esophagitis: Secondary | ICD-10-CM | POA: Diagnosis not present

## 2020-06-26 DIAGNOSIS — C689 Malignant neoplasm of urinary organ, unspecified: Secondary | ICD-10-CM | POA: Diagnosis not present

## 2020-06-26 DIAGNOSIS — J45909 Unspecified asthma, uncomplicated: Secondary | ICD-10-CM | POA: Diagnosis not present

## 2020-06-26 DIAGNOSIS — G4700 Insomnia, unspecified: Secondary | ICD-10-CM | POA: Diagnosis not present

## 2020-06-26 DIAGNOSIS — E785 Hyperlipidemia, unspecified: Secondary | ICD-10-CM | POA: Diagnosis not present

## 2020-06-26 DIAGNOSIS — I2510 Atherosclerotic heart disease of native coronary artery without angina pectoris: Secondary | ICD-10-CM | POA: Diagnosis not present

## 2020-07-05 DIAGNOSIS — M180 Bilateral primary osteoarthritis of first carpometacarpal joints: Secondary | ICD-10-CM | POA: Diagnosis not present

## 2020-07-13 DIAGNOSIS — C674 Malignant neoplasm of posterior wall of bladder: Secondary | ICD-10-CM | POA: Diagnosis not present

## 2020-07-19 NOTE — Progress Notes (Signed)
07/20/20- 6 yoF former smoker for Pulmonary evaluation COPD with Dyspnea, courtesy of Dr Mallie Mussel Smith/ Cardiology. She had complained of extreme fatigue with 30 minutes of physical actiivity, going to gym 4-5x/ week. No chest pain. Medical problem list includes CAD/ MI, CVA, HTN, Echo Grade 2 DD, GERD, IBS, Degenerative Disk Disease, Hx Bladder  Cancer, Hyperlipidemia,  Body weight today- 132 lbs Covid vax-3 Phizer Flu vax-had PFT 03/29/20-Very Minimal obstruction, no resp to BD, Normal Lung volumes and normal DLCO CBC w diff 12/24/19- Hgb 11.9/ Hct 35.7 I had seen her many years ago for allergy issues. Her concern now is perception of dyspnea on exertion, gradually over 2 years. Feels it a a los of power- can't exercise as vigorously, harder to keep up with husband she used to be able to [place]. Occasional minimal chest congestion or wheeze Denies hx DVT, pneumonia, or family hx emphysema. CT abdomen 02/10/20- I reviewed images with her and agree. Definite cystic emphysema. FINDINGS: Lower chest: Emphysema. Bibasilar scarring. Normal heart size without pericardial or pleural effusion. Walk Test on room air 07/20/20- Very brisk walk with lowest O2 sat 99% and Max HR 95   2 laps.  Prior to Admission medications   Medication Sig Start Date End Date Taking? Authorizing Provider  acyclovir (ZOVIRAX) 400 MG tablet Take 400 mg by mouth as needed. As needed   Yes [provider]  aspirin 81 MG tablet Take 81 mg by mouth at bedtime.    Yes [provider]  atorvastatin (LIPITOR) 40 MG tablet TAKE 1 TABLET EVERY DAY 05/12/20  Yes Belva Crome, MD  cyclobenzaprine (FLEXERIL) 10 MG tablet Take 10 mg by mouth as needed. 04/26/16  Yes [provider]  dicyclomine (BENTYL) 10 MG capsule Take 1 capsule (10 mg total) by mouth 2 (two) times daily as needed for spasms. 11/03/19  Yes Nandigam, Venia Minks, MD  Digestive Enzymes (MULTI-ENZYME) TABS Take 1 tablet by mouth as needed.   Yes  [provider]  diltiazem 2 % GEL Apply 1 application topically as needed. For rectal spasm 04/06/15  Yes Zehr, Janett Billow D, PA-C  ezetimibe (ZETIA) 10 MG tablet TAKE 1 TABLET (10 MG TOTAL) BY MOUTH DAILY. 05/12/20  Yes Belva Crome, MD  Fluticasone-Umeclidin-Vilant (TRELEGY ELLIPTA) 100-62.5-25 MCG/INH AEPB Inhale 1 puff into the lungs daily. 07/20/20  Yes Jerry Clyne, Tarri Fuller D, MD  hyoscyamine (LEVSIN SL) 0.125 MG SL tablet Take 1 tablet (0.125 mg total) by mouth daily as needed. Please schedule office visit. 02/16/20  Yes Nandigam, Venia Minks, MD  ibuprofen (ADVIL,MOTRIN) 200 MG tablet Take 600 mg by mouth every 6 (six) hours as needed for headache or moderate pain.   Yes [provider]  linaclotide (LINZESS) 290 MCG CAPS capsule Take 1 capsule (290 mcg total) by mouth daily before breakfast. 02/16/20  Yes Nandigam, Venia Minks, MD  nebivolol (BYSTOLIC) 2.5 MG tablet Take 1 tablet (2.5 mg total) by mouth daily. 03/06/20  Yes Belva Crome, MD  NEXIUM 40 MG capsule Take 1 capsule (40 mg total) by mouth daily. 08/06/18  Yes Nandigam, Venia Minks, MD  nitroGLYCERIN (NITROSTAT) 0.4 MG SL tablet place 1 tablet under the tongue if needed every 5 minutes for chest pain for 3 doses IF NO RELIEF AFTER 3RD DOSE CALL PRESCRIBER OR 911. 05/28/19  Yes Belva Crome, MD  oxyCODONE (OXY IR/ROXICODONE) 5 MG immediate release tablet Take 5 mg by mouth as needed for moderate pain. 01/26/15  Yes [provider]  Peppermint Oil 90 MG CPCR Take 2 capsules by mouth as needed.   Yes [provider]  Probiotic Product (PROBIOTIC ACIDOPHILUS) CAPS Take 1 capsule by mouth daily.   Yes [provider]  QUEtiapine (SEROQUEL) 25 MG tablet Take 1 tablet (25 mg total) by mouth at bedtime. 08/17/19  Yes Cottle, Billey Co., MD  ranolazine (RANEXA) 500 MG 12 hr tablet Take 1 tablet (500 mg total) by mouth 2 (two) times daily. 03/06/20  Yes Belva Crome, MD  traMADol (ULTRAM) 50 MG tablet Take 50 mg by  mouth every 6 (six) hours as needed.   Yes [provider]   Past Medical History:  Diagnosis Date  . Anxiety    Psychiatry Dr. Stan Head, concerns for memory loss, anxiety  . Bladder tumor   . Broken heart syndrome   . Cancer ([place])    bladder CA  . Chronic chest pain   . Coronary atherosclerosis CARDIOLOGIST-  DR Daneen Schick   Cath 11/2006 demonstrating moderate obstructive coronary disease with 70-80% septal perforator #1, 50-70% first diagonal, 80% third diagonal, 50% mid and distal LAD & heavy calcification throughout all 3 coronaries. LVF normal.  . Esophageal motility disorder   . Essential hypertension, benign   . Fatty liver 04/28/12  . GERD (gastroesophageal reflux disease)   . H/O esophageal spasm   . Heart murmur   . History of esophageal spasm    takes ranexa  . History of TIA (transient ischemic attack)    2011--  no residuals  . Hyperlipidemia   . IBS (irritable bowel syndrome)    Diarrhea predominent, esophageal spasm severe, GERD - Dr. Elicia Lamp  . Interstitial cystitis    Dr. Gaynelle Arabian  . Memory loss    Psychiatry Dr. Stan Head, concerns for memory loss, anxiety  . PONV (postoperative nausea and vomiting)    AND URINARY RETENTION  . Sensation of pressure in bladder area   . TMJ (temporomandibular joint syndrome)   . Wears glasses    Past Surgical History:  Procedure Laterality Date  . ABDOMINAL HYSTERECTOMY  AGE 28 -- 1989  . APPENDECTOMY  1971   AND OVARIAN CYSTECTOMY  . BENIGN EXCISIONAL BREAST BX  1996  . BREAST EXCISIONAL BIOPSY Right 1992  . CARDIAC CATHETERIZATION  11-28-2006   DR Daneen Schick    moderate cad/  70-80% septal perforator #1, 50-70% first diagonal, 80% third diagonal, 50% mid and distal LAD & heavy calcification throughout all 3 coronaries. LVF normal.  . CARDIAC CATHETERIZATION  03-26-2012   DR Daneen Schick   patent CFX,  RCA,  & pLAD/  mLAD >50% to <70% a focal eccentric region that overlaps the third diagonal/  90%  diagonal ostial and unchanged from prior study /   ef 65%  . CARPAL TUNNEL RELEASE Bilateral   . CYSTO WITH HYDRODISTENSION N/A 01/14/2014   Procedure: CYSTOSCOPY/HYDRODISTENSION/INSERTION OF MARCAINE AND PYRIDUIM INTO BLADDER/INJECTION OF MARCAINE AND KENALOG into sub trigone space ;  Surgeon: Ailene Rud, MD;  Location: [facility];  Service: Urology;  Laterality: N/A;  . CYSTO/  HYDRODISTENTION/  INSTILLATION THERAPY  03-12-2010  . ESOPHAGOGASTRODUODENOSCOPY  04/30/2012   Procedure: ESOPHAGOGASTRODUODENOSCOPY (EGD);  Surgeon: Lafayette Dragon, MD;  Location: [facility];  Service: Endoscopy;  Laterality: N/A;  . LEFT HEART CATH AND CORONARY ANGIOGRAPHY N/A 02/18/2017   Procedure: LEFT HEART CATH AND CORONARY ANGIOGRAPHY;  Surgeon: Belva Crome, MD;  Location: [facility];  Service: Cardiovascular;  Laterality: N/A;  . LUMBAR [place]  . NEGATIVE SLEEP STUDY  2010  . SHOULDER ARTHROSCOPY Left 12-23-2011  . SHOULDER SURGERY Right   . TRANSTHORACIC ECHOCARDIOGRAM  04-21-2012   GRADE I DIASTOLIC DYSFUNCTION/  EF 60-65%/  MILD MR  &  TR  . TRANSURETHRAL RESECTION OF BLADDER TUMOR Right 01/03/2015   Procedure: TRANSURETHRAL RESECTION OF BLADDER TUMOR (TURBT);  Surgeon: Carolan Clines, MD;  Location: [facility];  Service: Urology;  Laterality: Right;   Family History  Problem Relation Age of Onset  . Heart disease Mother   . Colon polyps Mother   . Alzheimer's disease Mother   . Dementia Mother   . CAD Mother   . Heart disease Father   . Diverticulosis Father   . CAD Father        in his 18s  . Heart disease Brother   . Osteoarthritis Other        Multiple family members  . Obesity Other        Multiple family members  . Depression Other        Multiple family members  . Breast cancer Sister 69       Pam  . Colon cancer Neg Hx   . Esophageal cancer Neg Hx   . Stomach cancer Neg Hx   . Rectal cancer Neg Hx    Social  History   Socioeconomic History  . Marital status: Married    Spouse name: Not on file  . Number of children: 2  . Years of education: Not on file  . Highest education level: Not on file  Occupational History  . Occupation: caregiver  Tobacco Use  . Smoking status: Former Smoker    Years: 10.00    Types: Cigarettes    Quit date: 06/10/1973    Years since quitting: 47.1  . Smokeless tobacco: Never Used  Vaping Use  . Vaping Use: Never used  Substance and Sexual Activity  . Alcohol use: Yes    Alcohol/week: 2.0 standard drinks    Types: 2 Glasses of wine per week    Comment: social  . Drug use: No  . Sexual activity: Yes  Other Topics Concern  . Not on file  Social History Narrative  . Not on file   Social Determinants of Health   Financial Resource Strain: Not on file  Food Insecurity: Not on file  Transportation Needs: Not on file  Physical Activity: Not on file  Stress: Not on file  Social Connections: Not on file  Intimate Partner Violence: Not on file   ROS-see HPI   + = positive Constitutional:    weight loss, night sweats, fevers, chills, fatigue, lassitude. HEENT:    headaches, difficulty swallowing, tooth/dental problems, sore throat,       sneezing, itching, ear ache, nasal congestion, post nasal drip, snoring CV:    chest pain, orthopnea, PND, swelling in lower extremities, anasarca,                                   dizziness, palpitations Resp:   shortness of breath with exertion or at rest.                productive cough,   non-productive cough, coughing up of blood.              change in color of mucus.  wheezing.  Skin:    rash or lesions. GI:  No-   heartburn, indigestion, abdominal pain, nausea, vomiting, diarrhea,                 change in bowel habits, loss of appetite GU: dysuria, change in color of urine, no urgency or frequency.   flank pain. MS:   joint pain, stiffness, decreased range of motion, back pain. Neuro-     nothing  unusual Psych:  change in mood or affect.  depression or anxiety.   memory loss.  OBJ- Physical Exam General- Alert, Oriented, Affect-appropriate, Distress- none acute, petite, looks well Skin- rash-none, lesions- none, excoriation- none Lymphadenopathy- none Head- atraumatic            Eyes- Gross vision intact, PERRLA, conjunctivae and secretions clear            Ears- Hearing, canals-normal            Nose- Clear, no-Septal dev, mucus, polyps, erosion, perforation             Throat- Mallampati II , mucosa clear , drainage- none, tonsils- atrophic Neck- flexible , trachea midline, no stridor , thyroid nl, carotid no bruit Chest - symmetrical excursion , unlabored           Heart/CV- RRR , no murmur , no gallop  , no rub, nl s1 s2                           - JVD- none , edema- none, stasis changes- none, varices- none           Lung- clear to P&A, wheeze- none, cough- none , dullness-none, rub- none           Chest wall-  Abd-  Br/ Gen/ Rectal- Not done, not indicated Extrem- cyanosis- none, clubbing, none, atrophy- none, strength- nl Neuro- grossly intact to observation

## 2020-07-20 ENCOUNTER — Encounter: Payer: Self-pay | Admitting: Internal Medicine

## 2020-07-20 ENCOUNTER — Ambulatory Visit (INDEPENDENT_AMBULATORY_CARE_PROVIDER_SITE_OTHER): Payer: Medicare Other | Admitting: Internal Medicine

## 2020-07-20 ENCOUNTER — Other Ambulatory Visit: Payer: Medicare Other

## 2020-07-20 ENCOUNTER — Other Ambulatory Visit: Payer: Self-pay

## 2020-07-20 VITALS — BP 120/80 | HR 71 | Temp 97.4°F | Ht 62.5 in | Wt 132.2 lb

## 2020-07-20 DIAGNOSIS — I25119 Atherosclerotic heart disease of native coronary artery with unspecified angina pectoris: Secondary | ICD-10-CM | POA: Diagnosis not present

## 2020-07-20 DIAGNOSIS — J439 Emphysema, unspecified: Secondary | ICD-10-CM | POA: Diagnosis not present

## 2020-07-20 DIAGNOSIS — R0600 Dyspnea, unspecified: Secondary | ICD-10-CM

## 2020-07-20 DIAGNOSIS — R0609 Other forms of dyspnea: Secondary | ICD-10-CM

## 2020-07-20 MED ORDER — TRELEGY ELLIPTA 100-62.5-25 MCG/INH IN AEPB: 1.0000 | INHALATION_SPRAY | Freq: Every day | RESPIRATORY_TRACT | 0 refills | Status: DC

## 2020-07-20 NOTE — Patient Instructions (Signed)
Order- sample Trelegy 100 inhale 1 puff then rinse mouth, once daily  Order schedule CT chest no contrast    Dx bullous empyhsema  Order- lab- alpha-1- antitrypsin phenotype    Dx bullous emphysema  Order- Walk test on room air   Dx bullous emphysema  Please call as needed

## 2020-07-21 ENCOUNTER — Telehealth: Payer: Self-pay | Admitting: Interventional Cardiology

## 2020-07-21 NOTE — Telephone Encounter (Signed)
Spoke with pt and she is concerned about having an upcoming cataract procedure.  She had a previous eye procedure where they used Tropicamide 0.5% and she had issues with her heart racing for 5 days.  For this procedure they are wanting to use a higher dose.  Pt mentioned it was 10% but in my research I could only find that it came in 0.5% and 1%.  Pt would like to know, if she does this procedure and her heart starts racing again, can she increase her Nebivolol or get another medication to help slow her heart rate?  Advised pt we are not likely to give instructions unless something were to happen, but I would send to Dr. Tamala Julian for review.  Pt did call in on 03/17/20 about chest pressure after having her eyes dilated.

## 2020-07-21 NOTE — Telephone Encounter (Signed)
Pt called in stated that she went to the eye dr last year and they gave her Tropicamide 0.5.  This med made her heart race for 5 days.  She needs to have Cadratic surgery but they will require them to give her 10 of this Tropicamide.  She is concerned.  She would like to talk to nurse of Dr about this   Best number  [phone]

## 2020-07-23 NOTE — Telephone Encounter (Signed)
Recommend proceeding with surgery. Make sure Physician-Ophthalmologist / anesthesiologist aware of concern.

## 2020-07-24 ENCOUNTER — Encounter: Payer: Self-pay | Admitting: Gastroenterology

## 2020-07-24 ENCOUNTER — Ambulatory Visit (INDEPENDENT_AMBULATORY_CARE_PROVIDER_SITE_OTHER): Payer: Medicare Other | Admitting: Gastroenterology

## 2020-07-24 VITALS — BP 140/70 | HR 60 | Ht 62.5 in | Wt 132.0 lb

## 2020-07-24 DIAGNOSIS — I25119 Atherosclerotic heart disease of native coronary artery with unspecified angina pectoris: Secondary | ICD-10-CM

## 2020-07-24 DIAGNOSIS — K581 Irritable bowel syndrome with constipation: Secondary | ICD-10-CM

## 2020-07-24 DIAGNOSIS — R0609 Other forms of dyspnea: Secondary | ICD-10-CM | POA: Insufficient documentation

## 2020-07-24 DIAGNOSIS — R0600 Dyspnea, unspecified: Secondary | ICD-10-CM | POA: Insufficient documentation

## 2020-07-24 NOTE — Telephone Encounter (Signed)
Becky with [facility] called and said that they will need to dilate pt eye before the surgery due to ins.  So the pt eyes with be dilated twice.  She stated they use the Tropricamide 1% And Phenylephrine 2.5% to dilate at the eval and surgery .  They would like to know if this will be ok.  They need to know it will be ok before they can even do the clearance   Best number [phone] with vm

## 2020-07-24 NOTE — Telephone Encounter (Signed)
Spoke with [name] and made her aware that Dr. Tamala Julian was aware of the medications and said ok to proceed with dilating her eyes.  Becky appreciative for call.

## 2020-07-24 NOTE — Patient Instructions (Addendum)
Medication Samples have been provided to the patient.  Drug name: Linzess       Strength: 22mcg        Qty: 12 capsules   LOT: [id number]  Exp.Date: 09/2020  Dosing instructions: Take once daily    Medication Samples have been provided to the patient.  Drug name: Trulance    Strength: 3mg         Qty: 9 tablets  LOT: [id number]  Exp.Date: 11/2021  Dosing instructions: Take once daily   The patient has been instructed regarding the correct time, dose, and frequency of taking this medication, including desired effects and most common side effects.    Call office back in a week or so and let us know which medication works better for you. At that time we will send in prescriptions for a 90 day supply.  Please keep 3 month follow up on 10/23/20 @ 8:10am with Dr.Nandigam.  If you are age 59 or older, your body mass index should be between 23-30. Your Body mass index is 23.76 kg/m. If this is out of the aforementioned range listed, please consider follow up with your Primary Care Provider.  Thank you for choosing me and [facility].  Dr.Nandigam

## 2020-07-24 NOTE — Assessment & Plan Note (Signed)
She managed very brisk walk here without desaturation and has normal range PFTs. Tempting to dismiss her dyspnea as likely cardiovascular, but the amount of basilar emphysema on CT abd is surprising. Plan- alpha-1-antitrypsin assay, CT chest to evaluate anatomic extent of cystic change. Sample Trial of Trelegy.

## 2020-07-24 NOTE — Telephone Encounter (Signed)
Spoke with pt and made her aware of Dr. Thompson Caul recommendations.  Pt inquired about a clearance being sent to eye doctor. Advised that office would need to call us or fax over a clearance form letting us know exactly what they are doing and any anesthesia that may be used.  Pt verbalized understanding.

## 2020-07-24 NOTE — Progress Notes (Signed)
[name]    [id number]    1951/01/02  Primary Care Physician:Miller, Bennetta [place], [place]  Referring Physician: Leeroy Cha, MD [address] [place],  [place] [address]   Chief complaint:  IBS- Constipation  HPI:  70 year old very pleasant female here for follow-up visit for chronic constipation  She continues to struggle with her bowel habits. She initially took Linzess 231mcg, was having on average 1 bowel movement daily within 2-3 hours of taking Linzess she subsequently tried Linzess 131mcg, she would have a BM 2-3 times a day and was unpredictable, she felt she was not completely evacuating. Then took Linzess 55mcg, she has a sensation like she needs to go but she sometimes is unable to evacuate goes few hours after taking it.  She felt it was causing tremendous stress for her.  She is frustrated and does not know what to do. She is worried about her husband, feels he is having memory issues and that is stressing her more.   EGD April 28, 2019: Normal esophagus.  Regular Z-line.  Gastritis biopsies negative for H. Pylori.  TTG IgA antibody negative for celiac  Colonoscopy April 07, 2013 showed internal hemorrhoids otherwise normal exam  Flexible sigmoidoscopy January 22, 2016 for persistent anorectal/pelvic pain was unremarkable other than internal hemorrhoids  Abdominal ultrasound January 27, 2020: Unremarkable  CT abdomen with contrast February 09, 2020: Large colonic stool burden otherwise no acute findings.  Additional chronic findings were noted in the report including emphysema, osteopenia and atherosclerosis   Outpatient Encounter Medications as of 07/24/2020  Medication Sig  . acyclovir (ZOVIRAX) 400 MG tablet Take 400 mg by mouth as needed. As needed  . aspirin 81 MG tablet Take 81 mg by mouth at bedtime.   Marland Kitchen atorvastatin (LIPITOR) 40 MG tablet TAKE 1 TABLET EVERY DAY  . cyclobenzaprine (FLEXERIL) 10 MG tablet Take  10 mg by mouth as needed.  . dicyclomine (BENTYL) 10 MG capsule Take 1 capsule (10 mg total) by mouth 2 (two) times daily as needed for spasms.  . Digestive Enzymes (MULTI-ENZYME) TABS Take 1 tablet by mouth as needed.  . diltiazem 2 % GEL Apply 1 application topically as needed. For rectal spasm  . ezetimibe (ZETIA) 10 MG tablet TAKE 1 TABLET (10 MG TOTAL) BY MOUTH DAILY.  Marland Kitchen Fluticasone-Umeclidin-Vilant (TRELEGY ELLIPTA) 100-62.5-25 MCG/INH AEPB Inhale 1 puff into the lungs daily.  . hyoscyamine (LEVSIN SL) 0.125 MG SL tablet Take 1 tablet (0.125 mg total) by mouth daily as needed. Please schedule office visit.  Marland Kitchen ibuprofen (ADVIL,MOTRIN) 200 MG tablet Take 600 mg by mouth every 6 (six) hours as needed for headache or moderate pain.  Marland Kitchen linaclotide (LINZESS) 290 MCG CAPS capsule Take 1 capsule (290 mcg total) by mouth daily before breakfast.  . nebivolol (BYSTOLIC) 2.5 MG tablet Take 1 tablet (2.5 mg total) by mouth daily.  Marland Kitchen NEXIUM 40 MG capsule Take 1 capsule (40 mg total) by mouth daily.  . nitroGLYCERIN (NITROSTAT) 0.4 MG SL tablet place 1 tablet under the tongue if needed every 5 minutes for chest pain for 3 doses IF NO RELIEF AFTER 3RD DOSE CALL PRESCRIBER OR 911.  Marland Kitchen oxyCODONE (OXY IR/ROXICODONE) 5 MG immediate release tablet Take 5 mg by mouth as needed for moderate pain.  Marland Kitchen Peppermint Oil 90 MG CPCR Take 2 capsules by mouth as needed.  . Probiotic Product (PROBIOTIC ACIDOPHILUS) CAPS Take 1 capsule by mouth daily.  Marland Kitchen  QUEtiapine (SEROQUEL) 25 MG tablet Take 1 tablet (25 mg total) by mouth at bedtime.  . ranolazine (RANEXA) 500 MG 12 hr tablet Take 1 tablet (500 mg total) by mouth 2 (two) times daily.  . traMADol (ULTRAM) 50 MG tablet Take 50 mg by mouth every 6 (six) hours as needed.   No facility-administered encounter medications on file as of 07/24/2020.    Allergies as of 07/24/2020 - Review Complete 07/20/2020  Allergen Reaction Noted  . Clindamycin/lincomycin Other (See Comments)  10/05/2011  . Doxycycline Nausea Only and Nausea And Vomiting 10/05/2011  . Epinephrine  08/23/2019  . Escitalopram oxalate Nausea Only 10/05/2011  . Macrodantin Other (See Comments) 10/05/2011  . Sulfa antibiotics  07/20/2020  . Trazodone Nausea Only 10/05/2011  . Trazodone and nefazodone Nausea Only 10/05/2011  . Trimethoprim Other (See Comments) 12/09/2013  . Lincomycin Rash 10/05/2011  . Nitrofurantoin Rash 10/05/2011  . Nitrofurantoin macrocrystal Anxiety     Past Medical History:  Diagnosis Date  . Anxiety    Psychiatry Dr. Stan Head, concerns for memory loss, anxiety  . Bladder tumor   . Broken heart syndrome   . Cancer ([place])    bladder CA  . Chronic chest pain   . Coronary atherosclerosis CARDIOLOGIST-  DR Daneen Schick   Cath 11/2006 demonstrating moderate obstructive coronary disease with 70-80% septal perforator #1, 50-70% first diagonal, 80% third diagonal, 50% mid and distal LAD & heavy calcification throughout all 3 coronaries. LVF normal.  . Esophageal motility disorder   . Essential hypertension, benign   . Fatty liver 04/28/12  . GERD (gastroesophageal reflux disease)   . H/O esophageal spasm   . Heart murmur   . History of esophageal spasm    takes ranexa  . History of TIA (transient ischemic attack)    2011--  no residuals  . Hyperlipidemia   . IBS (irritable bowel syndrome)    Diarrhea predominent, esophageal spasm severe, GERD - Dr. Elicia Lamp  . Interstitial cystitis    Dr. Gaynelle Arabian  . Memory loss    Psychiatry Dr. Stan Head, concerns for memory loss, anxiety  . PONV (postoperative nausea and vomiting)    AND URINARY RETENTION  . Sensation of pressure in bladder area   . TMJ (temporomandibular joint syndrome)   . Wears glasses     Past Surgical History:  Procedure Laterality Date  . ABDOMINAL HYSTERECTOMY  AGE 67 -- 1989  . APPENDECTOMY  1971   AND OVARIAN CYSTECTOMY  . BENIGN EXCISIONAL BREAST BX  1996  . BREAST EXCISIONAL BIOPSY  Right 1992  . CARDIAC CATHETERIZATION  11-28-2006   DR Daneen Schick    moderate cad/  70-80% septal perforator #1, 50-70% first diagonal, 80% third diagonal, 50% mid and distal LAD & heavy calcification throughout all 3 coronaries. LVF normal.  . CARDIAC CATHETERIZATION  03-26-2012   DR Daneen Schick   patent CFX,  RCA,  & pLAD/  mLAD >50% to <70% a focal eccentric region that overlaps the third diagonal/  90% diagonal ostial and unchanged from prior study /   ef 65%  . CARPAL TUNNEL RELEASE Bilateral   . CYSTO WITH HYDRODISTENSION N/A 01/14/2014   Procedure: CYSTOSCOPY/HYDRODISTENSION/INSERTION OF MARCAINE AND PYRIDUIM INTO BLADDER/INJECTION OF MARCAINE AND KENALOG into sub trigone space ;  Surgeon: Ailene Rud, MD;  Location: [facility];  Service: Urology;  Laterality: N/A;  . CYSTO/  HYDRODISTENTION/  INSTILLATION THERAPY  03-12-2010  . ESOPHAGOGASTRODUODENOSCOPY  04/30/2012   Procedure: ESOPHAGOGASTRODUODENOSCOPY (  EGD);  Surgeon: Lafayette Dragon, MD;  Location: [facility];  Service: Endoscopy;  Laterality: N/A;  . LEFT HEART CATH AND CORONARY ANGIOGRAPHY N/A 02/18/2017   Procedure: LEFT HEART CATH AND CORONARY ANGIOGRAPHY;  Surgeon: Belva Crome, MD;  Location: [facility];  Service: Cardiovascular;  Laterality: N/A;  . LUMBAR [place]  . NEGATIVE SLEEP STUDY  2010  . SHOULDER ARTHROSCOPY Left 12-23-2011  . SHOULDER SURGERY Right   . TRANSTHORACIC ECHOCARDIOGRAM  04-21-2012   GRADE I DIASTOLIC DYSFUNCTION/  EF 60-65%/  MILD MR  &  TR  . TRANSURETHRAL RESECTION OF BLADDER TUMOR Right 01/03/2015   Procedure: TRANSURETHRAL RESECTION OF BLADDER TUMOR (TURBT);  Surgeon: Carolan Clines, MD;  Location: [facility];  Service: Urology;  Laterality: Right;    Family History  Problem Relation Age of Onset  . Heart disease Mother   . Colon polyps Mother   . Alzheimer's disease Mother   . Dementia Mother   . CAD Mother   . Heart disease  Father   . Diverticulosis Father   . CAD Father        in his 10s  . Heart disease Brother   . Osteoarthritis Other        Multiple family members  . Obesity Other        Multiple family members  . Depression Other        Multiple family members  . Breast cancer Sister 63       Pam  . Colon cancer Neg Hx   . Esophageal cancer Neg Hx   . Stomach cancer Neg Hx   . Rectal cancer Neg Hx     Social History   Socioeconomic History  . Marital status: Married    Spouse name: Not on file  . Number of children: 2  . Years of education: Not on file  . Highest education level: Not on file  Occupational History  . Occupation: caregiver  Tobacco Use  . Smoking status: Former Smoker    Years: 10.00    Types: Cigarettes    Quit date: 06/10/1973    Years since quitting: 47.1  . Smokeless tobacco: Never Used  Vaping Use  . Vaping Use: Never used  Substance and Sexual Activity  . Alcohol use: Yes    Alcohol/week: 2.0 standard drinks    Types: 2 Glasses of wine per week    Comment: social  . Drug use: No  . Sexual activity: Yes  Other Topics Concern  . Not on file  Social History Narrative  . Not on file   Social Determinants of Health   Financial Resource Strain: Not on file  Food Insecurity: Not on file  Transportation Needs: Not on file  Physical Activity: Not on file  Stress: Not on file  Social Connections: Not on file  Intimate Partner Violence: Not on file      Review of systems: All other review of systems negative except as mentioned in the HPI.   Physical Exam: Vitals:   07/24/20 0816  BP: 140/70  Pulse: 60   Body mass index is 23.76 kg/m. Gen:      No acute distress HEENT:  sclera anicteric Abd:      soft, non-tender; no palpable masses, no distension Ext:    No edema Neuro: alert and oriented x 3 Psych: normal mood and affect  Data Reviewed:  Reviewed labs, radiology imaging, old records and pertinent past GI work  up   Assessment and  Plan/Recommendations:  70 year old very pleasant female with history of esophageal spasms, GERD, bladder cancer 2016, CAD s/p TIA and IBS constipation  IBS constipation: Encourage patient to continue with increased water intake dietary fiber.   Based on conversation it appears she had the best results with Linzess 290 mcg daily.  The recommendation is to take it on empty stomach but if it is interfering with her lifestyle, it is okay to take it in the afternoon. She can take Linzess before or after meals.  She was also provided samples for Trulance 3 mg daily to try, advised patient to call back which laxative she would prefer so we could send her the prescription  GERD: Continue Nexium and antireflux measures  Return in 3 months or sooner if needed  This visit required >40 minutes of patient care (this includes precharting, chart review, review of results, face-to-face time used for counseling as well as treatment plan and follow-up. The patient was provided an opportunity to ask questions and all were answered. The patient agreed with the plan and demonstrated an understanding of the instructions.  Damaris Hippo , MD    CC: Leeroy Cha

## 2020-07-24 NOTE — Assessment & Plan Note (Signed)
Extensive atherosclerosis, being managed by cardiology.I doubt her beta-blocker explains her DOE complaint.

## 2020-07-25 ENCOUNTER — Encounter: Payer: Self-pay | Admitting: Gastroenterology

## 2020-07-31 LAB — ALPHA-1 ANTITRYPSIN PHENOTYPE: A-1 Antitrypsin, Ser: 137 mg/dL (ref 83–199)

## 2020-08-01 ENCOUNTER — Encounter: Payer: Self-pay | Admitting: *Deleted

## 2020-08-08 ENCOUNTER — Other Ambulatory Visit: Payer: Self-pay

## 2020-08-08 ENCOUNTER — Ambulatory Visit
Admission: RE | Admit: 2020-08-08 | Discharge: 2020-08-08 | Disposition: A | Discharge status: Home / Self Care | Payer: Medicare Other | Source: Ambulatory Visit | Attending: Internal Medicine | Admitting: Internal Medicine

## 2020-08-08 ENCOUNTER — Telehealth: Payer: Self-pay | Admitting: Gastroenterology

## 2020-08-08 DIAGNOSIS — R918 Other nonspecific abnormal finding of lung field: Secondary | ICD-10-CM | POA: Diagnosis not present

## 2020-08-08 DIAGNOSIS — J439 Emphysema, unspecified: Secondary | ICD-10-CM

## 2020-08-08 MED ORDER — NEXIUM 40 MG PO CPDR
40.0000 mg | DELAYED_RELEASE_CAPSULE | Freq: Every day | ORAL | 3 refills | Status: DC
Start: 2020-08-08 — End: 2020-08-17

## 2020-08-08 NOTE — Telephone Encounter (Signed)
90 day Nexium brand name only sent to Mail order pharmacy  We will not know if it needs a prior auth until we get a rejection back from the pharmacy  If we get one we will proceed with the prior auth

## 2020-08-08 NOTE — Telephone Encounter (Signed)
Patient needs to refill Nexium (brand name) for 90-day supply. Please send it to [facility] mail order. Pt states that it needs authorization since it is the beginning of the year. She would like a call when it is taken care of.

## 2020-08-09 ENCOUNTER — Telehealth: Payer: Self-pay | Admitting: Internal Medicine

## 2020-08-09 NOTE — Telephone Encounter (Signed)
I called Dr [facility] about apparent discrepancy- emphysema called on CT abdomen from September and not seen on CT chest now. He compared images and reviewed with his partner. Consensus- abdomen was affected by technique ?motion?Marland Kitchen There is no emphysema. There is a nodule which we will look at again in 6 months due to her hx of bladder cancer. I called [name] and discussed these issues with her. She indicated understanding. She will keep appointment as scheduled in April.

## 2020-08-10 NOTE — Telephone Encounter (Signed)
Prior authorization done today and faxed back to [facility] name exception  Waiting on response

## 2020-08-11 NOTE — Telephone Encounter (Signed)
Received fax from [facility], Nexium DR 40 mg capsule has been approved. Authorization is good until 06/09/2021.

## 2020-08-14 ENCOUNTER — Telehealth: Payer: Self-pay | Admitting: *Deleted

## 2020-08-14 ENCOUNTER — Other Ambulatory Visit: Payer: Self-pay | Admitting: *Deleted

## 2020-08-14 MED ORDER — LINACLOTIDE 290 MCG PO CAPS: 290.0000 ug | ORAL_CAPSULE | Freq: Every day | ORAL | 3 refills | Status: DC

## 2020-08-14 NOTE — Telephone Encounter (Signed)
I faxed an appeal for lower cost of brand name Nexium today and faxed back to [facility] at [phone] waiting on response.. Called patient to inform

## 2020-08-14 NOTE — Progress Notes (Signed)
Pt called wanting refills of Linzess 290 sent to [facility]..... Sent electronically today

## 2020-08-15 ENCOUNTER — Other Ambulatory Visit: Payer: Self-pay

## 2020-08-15 ENCOUNTER — Encounter: Payer: Self-pay | Admitting: Psychiatry

## 2020-08-15 ENCOUNTER — Ambulatory Visit (INDEPENDENT_AMBULATORY_CARE_PROVIDER_SITE_OTHER): Payer: Medicare Other | Admitting: Psychiatry

## 2020-08-15 DIAGNOSIS — F09 Unspecified mental disorder due to known physiological condition: Secondary | ICD-10-CM

## 2020-08-15 DIAGNOSIS — I25119 Atherosclerotic heart disease of native coronary artery with unspecified angina pectoris: Secondary | ICD-10-CM | POA: Diagnosis not present

## 2020-08-15 DIAGNOSIS — F5101 Primary insomnia: Secondary | ICD-10-CM

## 2020-08-15 MED ORDER — MEMANTINE HCL 10 MG PO TABS: ORAL_TABLET | ORAL | 1 refills | Status: DC

## 2020-08-15 MED ORDER — QUETIAPINE FUMARATE 25 MG PO TABS: 25.0000 mg | ORAL_TABLET | Freq: Every day | ORAL | 3 refills | Status: DC

## 2020-08-15 MED ORDER — PANTOPRAZOLE SODIUM 40 MG PO TBEC: 40.0000 mg | DELAYED_RELEASE_TABLET | Freq: Every day | ORAL | 1 refills | Status: DC

## 2020-08-15 NOTE — Progress Notes (Signed)
[name] [id number] [birth year] 70 y.o.  Subjective:   Patient ID:  [name] is a 70 y.o. (DOB [birth year]) female.  Chief Complaint:  Chief Complaint  Patient presents with  . Primary insomnia  . Follow-up    HPI   [name] presents to the office today for follow-up of chronic insomnia and some anxiety.  seen March 2020.  No meds were changed.  Taking quetiapine daily. 80% of the time it is effective.    08/14/20 appt noted: Seroquel does work, but hangover an hour, but worth it for the sleep.  Goes to sleep and stays asleepMay stay up too late and miss the window at times.  Busy.  Not morning person. No other SE.  Overall OK.  Still concerned about [name] and his memory and her memory too.  Daughter continues to be a challenge Re: health.  Son [name] is a patient here and CO not going to sleep on time.  He's 38.  Also concerned with [name]'s mental health and both his brother's have had sons who committed suicide.  1 of his B with Alzheimers and she's had chronic worry over getting Alzheimer's.  [name] is good socially and exercises.  He does Bible studies and is life of the party publicly.  But she worries over his memory at home. He has agreed to see a neurologist.    She still worries over her memory and cog function too.   Still complains of chronic worry issues and more worry over word-finding problems.  But she has no functional impairment.  Patient reports stable mood and denies depressed or irritable moods.  Patient denies any recent difficulty with anxiety.  Patient denies difficulty with sleep initiation or maintenance as long as she takes the quetiapine.  Gets 7 hours usually.  Not drowsy daytime unless she sits to read she's tired chronically. Denies appetite disturbance.  Patient reports that energy and motivation have been good.  Patient denies any difficulty with concentration.  Patient denies any suicidal ideation.  Goes to gym. 4 years clean from  cancer.  Past Psychiatric Medication Trials: Multiple sleep med failures including temazepam,  clonazepam with sedation,  alprazolam with no response,  trazodone GI side effects,  doxylamine,  Belsomra no response,  quetiapine good response at 25 mg she has hangover Lexapro nausea,   sertraline 25 mg with cognitive side effects,  .    She has been treated treated for anxiety as a way of trying to address the insomnia but that was not an adequate solution.  Prior neuropsychological testing 2004 because the patient had complaints of memory problems was negative for significant cognitive impairment  Review of Systems:  Review of Systems  Respiratory: Negative for shortness of breath.   Cardiovascular: Negative for chest pain and palpitations.  Musculoskeletal: Positive for arthralgias. Negative for back pain.  Neurological: Negative for tremors and weakness.  Psychiatric/Behavioral: Positive for decreased concentration and sleep disturbance. Negative for agitation, behavioral problems, confusion, dysphoric mood, hallucinations, self-injury and suicidal ideas. The patient is not nervous/anxious and is not hyperactive.     Medications: I have reviewed the patient's current medications.  Current Outpatient Medications  Medication Sig Dispense Refill  . aspirin 81 MG tablet Take 81 mg by mouth at bedtime.     Marland Kitchen atorvastatin (LIPITOR) 40 MG tablet TAKE 1 TABLET EVERY DAY 90 tablet 3  . cyclobenzaprine (FLEXERIL) 10 MG tablet Take 10 mg by mouth as needed.    . dicyclomine (  BENTYL) 10 MG capsule Take 1 capsule (10 mg total) by mouth 2 (two) times daily as needed for spasms. 180 capsule 0  . Digestive Enzymes (MULTI-ENZYME) TABS Take 1 tablet by mouth as needed.    . diltiazem 2 % GEL Apply 1 application topically as needed. For rectal spasm 30 g 1  . ezetimibe (ZETIA) 10 MG tablet TAKE 1 TABLET (10 MG TOTAL) BY MOUTH DAILY. 90 tablet 3  . Fluticasone-Umeclidin-Vilant (TRELEGY ELLIPTA)  100-62.5-25 MCG/INH AEPB Inhale 1 puff into the lungs daily. 28 each 0  . hyoscyamine (LEVSIN SL) 0.125 MG SL tablet Take 1 tablet (0.125 mg total) by mouth daily as needed. Please schedule office visit. 90 tablet 3  . ibuprofen (ADVIL,MOTRIN) 200 MG tablet Take 600 mg by mouth every 6 (six) hours as needed for headache or moderate pain.    Marland Kitchen linaclotide (LINZESS) 290 MCG CAPS capsule Take 1 capsule (290 mcg total) by mouth daily before breakfast. 90 capsule 3  . memantine (NAMENDA) 10 MG tablet 1/2 at night for a week, then 1/2 twice daily for 1 week, the 1/2 in the morning and 1 at night, then 1 twice daily 60 tablet 1  . nebivolol (BYSTOLIC) 2.5 MG tablet Take 1 tablet (2.5 mg total) by mouth daily. 90 tablet 3  . NEXIUM 40 MG capsule Take 1 capsule (40 mg total) by mouth daily. 90 capsule 3  . nitroGLYCERIN (NITROSTAT) 0.4 MG SL tablet place 1 tablet under the tongue if needed every 5 minutes for chest pain for 3 doses IF NO RELIEF AFTER 3RD DOSE CALL PRESCRIBER OR 911. 75 tablet 1  . oxyCODONE (OXY IR/ROXICODONE) 5 MG immediate release tablet Take 5 mg by mouth as needed for moderate pain.    Marland Kitchen Peppermint Oil 90 MG CPCR Take 2 capsules by mouth as needed.    . Probiotic Product (PROBIOTIC ACIDOPHILUS) CAPS Take 1 capsule by mouth daily.    . ranolazine (RANEXA) 500 MG 12 hr tablet Take 1 tablet (500 mg total) by mouth 2 (two) times daily. 180 tablet 3  . traMADol (ULTRAM) 50 MG tablet Take 50 mg by mouth every 6 (six) hours as needed.    Marland Kitchen acyclovir (ZOVIRAX) 400 MG tablet Take 400 mg by mouth as needed. As needed (Patient not taking: Reported on 08/15/2020)    . pantoprazole (PROTONIX) 40 MG tablet Take 1 tablet (40 mg total) by mouth daily. (Patient not taking: Reported on 08/15/2020) 90 tablet 1  . QUEtiapine (SEROQUEL) 25 MG tablet Take 1 tablet (25 mg total) by mouth at bedtime. 90 tablet 3   No current facility-administered medications for this visit.    Medication Side Effects:  A little  hard to get out of the bed  Allergies:  Allergies  Allergen Reactions  . Clindamycin/Lincomycin Other (See Comments)    Pt not sure of reaction   . Doxycycline Nausea Only and Nausea And Vomiting  . Epinephrine   . Escitalopram Oxalate Nausea Only  . Macrodantin Other (See Comments)    Flushed, hot  . Sulfa Antibiotics   . Trazodone Nausea Only  . Trazodone And Nefazodone Nausea Only  . Trimethoprim Other (See Comments)    unknown   . Lincomycin Rash    Reaction unknown  . Nitrofurantoin Rash    Flushed, hot  . Nitrofurantoin Macrocrystal Anxiety    Flushing, hot face and ears    Past Medical History:  Diagnosis Date  . Anxiety    Psychiatry Dr. Louellen Molder  [place], concerns for memory loss, anxiety  . Bladder tumor   . Broken heart syndrome   . 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 Chronic chest pain   . Coronary atherosclerosis CARDIOLOGIST-  DR Daneen Schick   Cath 11/2006 demonstrating moderate obstructive coronary disease with 70-80% septal perforator #1, 50-70% first diagonal, 80% third diagonal, 50% mid and distal LAD & heavy calcification throughout all 3 coronaries. LVF normal.  . Esophageal motility disorder   . Essential hypertension, benign   . Fatty liver 04/28/12  . GERD (gastroesophageal reflux disease)   . H/O esophageal spasm   . Heart murmur   . History of esophageal spasm    takes ranexa  . History of TIA (transient ischemic attack)    2011--  no residuals  . Hyperlipidemia   . IBS (irritable bowel syndrome)    Diarrhea predominent, esophageal spasm severe, GERD - Dr. Elicia Lamp  . Interstitial cystitis    Dr. Gaynelle Arabian  . Memory loss    Psychiatry Dr. Stan Head, concerns for memory loss, anxiety  . PONV (postoperative nausea and vomiting)    AND URINARY RETENTION  . Sensation of pressure in bladder area   . TMJ (temporomandibular joint syndrome)   . Wears glasses     Family History  Problem Relation Age of Onset  . Heart disease Mother   . Colon  polyps Mother   . Alzheimer's disease Mother   . Dementia Mother   . CAD Mother   . Heart disease Father   . Diverticulosis Father   . CAD Father        in his 57s  . Heart disease Brother   . Osteoarthritis Other        Multiple family members  . Obesity Other        Multiple family members  . Depression Other        Multiple family members  . Breast cancer Sister 31       Pam  . Colon cancer Neg Hx   . Esophageal cancer Neg Hx   . Stomach cancer Neg Hx   . Rectal cancer Neg Hx     Social History   Socioeconomic History  . Marital status: Married    Spouse name: Not on file  . Number of children: 2  . Years of education: Not on file  . Highest education level: Not on file  Occupational History  . Occupation: caregiver  Tobacco Use  . Smoking status: Former Smoker    Years: 10.00    Types: Cigarettes    Quit date: 06/10/1973    Years since quitting: 47.2  . Smokeless tobacco: Never Used  Vaping Use  . Vaping Use: Never used  Substance and Sexual Activity  . Alcohol use: Yes    Alcohol/week: 2.0 standard drinks    Types: 2 Glasses of wine per week    Comment: social  . Drug use: No  . Sexual activity: Yes  Other Topics Concern  . Not on file  Social History Narrative  . Not on file   Social Determinants of Health   Financial Resource Strain: Not on file  Food Insecurity: Not on file  Transportation Needs: Not on file  Physical Activity: Not on file  Stress: Not on file  Social Connections: Not on file  Intimate Partner Violence: Not on file    Past Medical History, Surgical history, Social history, and Family history were reviewed and updated as appropriate.  Please see review of systems for further details on the patient's review from today.   Objective:   Physical Exam:  There were no vitals taken for this visit.  Physical Exam Constitutional:      General: She is not in acute distress.    Appearance: She is well-developed.   Musculoskeletal:        General: No deformity.  Neurological:     Mental Status: She is alert and oriented to person, place, and time.     Motor: No tremor.     Coordination: Coordination normal.     Gait: Gait normal.  Psychiatric:        Attention and Perception: Attention normal. She is attentive.        Mood and Affect: Mood normal. Mood is not anxious or depressed. Affect is not labile, blunt, angry or inappropriate.        Speech: Speech normal. Speech is not rapid and pressured.        Behavior: Behavior normal.        Thought Content: Thought content normal. Thought content does not include homicidal or suicidal ideation. Thought content does not include homicidal or suicidal plan.        Cognition and Memory: Cognition normal. She exhibits impaired recent memory.        Judgment: Judgment normal.     Comments: Insight is good.     Lab Review:     Component Value Date/Time   NA 136 12/24/2019 1505   NA 140 11/03/2018 1029   K 4.3 12/24/2019 1505   CL 99 12/24/2019 1505   CO2 31 12/24/2019 1505   GLUCOSE 93 12/24/2019 1505   BUN 13 12/24/2019 1505   BUN 11 11/03/2018 1029   CREATININE 0.90 02/09/2020 1753   CREATININE 0.80 01/12/2018 1031   CALCIUM 9.2 12/24/2019 1505   PROT 6.9 12/24/2019 1505   PROT 6.4 11/03/2018 1029   ALBUMIN 4.3 12/24/2019 1505   ALBUMIN 4.4 11/03/2018 1029   AST 17 12/24/2019 1505   ALT 17 12/24/2019 1505   ALKPHOS 83 12/24/2019 1505   BILITOT 0.3 12/24/2019 1505   BILITOT 0.2 11/03/2018 1029   GFRNONAA 74 11/03/2018 1029   GFRNONAA 77 01/12/2018 1031   GFRAA 86 11/03/2018 1029   GFRAA 89 01/12/2018 1031       Component Value Date/Time   WBC 7.3 12/24/2019 1505   RBC 4.16 12/24/2019 1505   HGB 11.9 (L) 12/24/2019 1505   HGB 12.1 11/06/2016 1545   HCT 35.7 (L) 12/24/2019 1505   HCT 37.8 11/06/2016 1545   PLT 267.0 12/24/2019 1505   PLT 281 11/06/2016 1545   MCV 85.8 12/24/2019 1505   MCV 89 11/06/2016 1545   MCH 28.9  01/12/2018 1031   MCHC 33.5 12/24/2019 1505   RDW 15.1 12/24/2019 1505   RDW 14.8 11/06/2016 1545   LYMPHSABS 1.5 12/24/2019 1505   MONOABS 0.6 12/24/2019 1505   EOSABS 0.1 12/24/2019 1505   BASOSABS 0.0 12/24/2019 1505    No results found for: POCLITH, LITHIUM   No results found for: PHENYTOIN, PHENOBARB, VALPROATE, CBMZ   .res Assessment: Plan:    Primary insomnia - Plan: QUEtiapine (SEROQUEL) 25 MG tablet  Cognitive dysfunction - Plan: memantine (NAMENDA) 10 MG tablet  Word-finding problems  Supportive therapy on dealing with family situations and worries that [name] will get Alzheimer's also and disagreement with H on variety of subjects and he's been irritable with her.  Disc using faith to  strengthen herself with stressors.  Disc chronic worry over memory issues with normal neuropsych testing.  Normalized some cognitive decline with age.  [name]'s brother has dementia.    Disc risk of sleep meds and risk of not taking them and having poor sleep.  Disc sleep hygiene.  Re: quetiapine low dosage off label for sleep bc of TR insomnia and multiple other med failures.   It has been successful generally.  Discussed potential metabolic side effects associated with atypical antipsychotics, as well as potential risk for movement side effects. Advised pt to contact office if movement side effects occur.   Continue low dose quetiapine 25 mg HS for TR insomnia.  Disc the off-label use of N-Acetylcysteine at 600 mg daily to help with mild cognitive problems.  It can be combined with a B-complex vitamin as the B-12 and folate have been shown to sometimes enhance the effect.  This appt was 30 mins.  FU 1 year  Lynder Parents, MD, DFAPA  Please see After Visit Summary for patient specific instructions.  Future Appointments  Date Time Provider [place]  09/18/2020  2:00 PM Deneise Lever, MD [facility] None  10/23/2020  8:10 AM Nandigam, Venia Minks, MD [facility]    No  orders of the defined types were placed in this encounter.     -------------------------------

## 2020-08-16 ENCOUNTER — Encounter: Payer: Self-pay | Admitting: *Deleted

## 2020-08-16 ENCOUNTER — Other Ambulatory Visit: Payer: Self-pay | Admitting: Psychiatry

## 2020-08-16 DIAGNOSIS — F5101 Primary insomnia: Secondary | ICD-10-CM

## 2020-08-16 NOTE — Telephone Encounter (Signed)
Lower cost exception deined for patient. Patient ok'd by Dr Silverio Decamp to use pantoprazole

## 2020-08-17 ENCOUNTER — Other Ambulatory Visit: Payer: Self-pay | Admitting: *Deleted

## 2020-08-17 MED ORDER — NEXIUM 40 MG PO CPDR: 40.0000 mg | DELAYED_RELEASE_CAPSULE | Freq: Every day | ORAL | 3 refills | Status: DC

## 2020-08-18 DIAGNOSIS — H25013 Cortical age-related cataract, bilateral: Secondary | ICD-10-CM | POA: Diagnosis not present

## 2020-08-18 DIAGNOSIS — H18413 Arcus senilis, bilateral: Secondary | ICD-10-CM | POA: Diagnosis not present

## 2020-08-18 DIAGNOSIS — H2511 Age-related nuclear cataract, right eye: Secondary | ICD-10-CM | POA: Diagnosis not present

## 2020-08-18 DIAGNOSIS — H18513 Endothelial corneal dystrophy, bilateral: Secondary | ICD-10-CM | POA: Diagnosis not present

## 2020-08-18 DIAGNOSIS — H25043 Posterior subcapsular polar age-related cataract, bilateral: Secondary | ICD-10-CM | POA: Diagnosis not present

## 2020-08-18 DIAGNOSIS — H2513 Age-related nuclear cataract, bilateral: Secondary | ICD-10-CM | POA: Diagnosis not present

## 2020-08-21 ENCOUNTER — Other Ambulatory Visit: Payer: Self-pay | Admitting: Gastroenterology

## 2020-08-22 ENCOUNTER — Other Ambulatory Visit: Payer: Self-pay | Admitting: *Deleted

## 2020-08-22 MED ORDER — ESOMEPRAZOLE MAGNESIUM 40 MG PO CPDR: 40.0000 mg | DELAYED_RELEASE_CAPSULE | Freq: Two times a day (BID) | ORAL | 11 refills | Status: DC

## 2020-08-22 NOTE — Progress Notes (Signed)
See Pt advise request, pt wishes to rey generic Nexium due to cost  Can not take the pantoprazole it makes her sick

## 2020-08-28 DIAGNOSIS — C689 Malignant neoplasm of urinary organ, unspecified: Secondary | ICD-10-CM | POA: Diagnosis not present

## 2020-08-28 DIAGNOSIS — J45909 Unspecified asthma, uncomplicated: Secondary | ICD-10-CM | POA: Diagnosis not present

## 2020-08-28 DIAGNOSIS — I2510 Atherosclerotic heart disease of native coronary artery without angina pectoris: Secondary | ICD-10-CM | POA: Diagnosis not present

## 2020-08-28 DIAGNOSIS — E785 Hyperlipidemia, unspecified: Secondary | ICD-10-CM | POA: Diagnosis not present

## 2020-08-28 DIAGNOSIS — G4700 Insomnia, unspecified: Secondary | ICD-10-CM | POA: Diagnosis not present

## 2020-08-28 DIAGNOSIS — K219 Gastro-esophageal reflux disease without esophagitis: Secondary | ICD-10-CM | POA: Diagnosis not present

## 2020-09-17 NOTE — Progress Notes (Signed)
HPI-   F former smoker followed for  Dyspnea, Lung Nodule, courtesy of Dr Mallie Mussel Smith/ Cardiology. She had complained of extreme fatigue with 30 minutes of physical actiivity, going to gym 4-5x/ week. No chest pain. Medical problem list includes CAD/ MI, CVA, HTN, Echo Grade 2 DD, GERD, IBS, Degenerative Disk Disease, Hx Bladder  Cancer, Hyperlipidemia,  PFT 03/29/20-Very Minimal obstruction, no resp to BD, Normal Lung volumes and normal DLCO Walk Test on room air 07/20/20- Very brisk walk with lowest O2 sat 99% and Max HR 95   2 laps. a1AT assay 07/20/20- MM 137 CT chest 08/09/20- No signs of emphysema. 9 mm non-solid nodule within the lateral superior segment of the left lower lobe.  ==============================================================  07/20/20- 69 yoF former smoker for Pulmonary evaluation COPD with Dyspnea, courtesy of Dr Mallie Mussel Smith/ Cardiology. She had complained of extreme fatigue with 30 minutes of physical actiivity, going to gym 4-5x/ week. No chest pain. Medical problem list includes CAD/ MI, CVA, HTN, Echo Grade 2 DD, GERD, IBS, Degenerative Disk Disease, Hx Bladder  Cancer, Hyperlipidemia,  Body weight today- 132 lbs Covid vax-3 Phizer Flu vax-had PFT 03/29/20-Very Minimal obstruction, no resp to BD, Normal Lung volumes and normal DLCO CBC w diff 12/24/19- Hgb 11.9/ Hct 35.7 I had seen her many years ago for allergy issues. Her concern now is perception of dyspnea on exertion, gradually over 2 years. Feels it a a los of power- can't exercise as vigorously, harder to keep up with husband she used to be able to [place]. Occasional minimal chest congestion or wheeze Denies hx DVT, pneumonia, or family hx emphysema. CT abdomen 02/10/20- I reviewed images with her and agree. Definite cystic emphysema. FINDINGS: Lower chest: Emphysema. Bibasilar scarring. Normal heart size without pericardial or pleural effusion. Walk Test on room air 07/20/20- Very brisk walk with lowest O2 sat 99%  and Max HR 95   2 laps. CT chest 08/09/20-  IMPRESSION: 1. No signs of emphysema. 2. 9 mm non-solid nodule within the lateral superior segment of the left lower lobe  09/18/20-70yo  F former smoker(  passive smoke exposure to parents as a child, and she smoked some before age 2.) followed for Dyspnea, Lung Nodule, courtesy of Dr Mallie Mussel Smith/ Cardiology. She had complained of extreme fatigue with 30 minutes of physical actiivity, going to gym 4-5x/ week. No chest pain. 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 -----Requesting clarification of CT results.  She understands that CT chest shows no emphysema. There is a 9 mm LLL nodule we will recheck in 6-12 months per Radiology recommendation.  \This recommendation follows the consensus statement: Guidelines for Management of Incidental Pulmonary Nodules Detected on CT Images: From the Fleischner Society 2017; Radiology 2017; 284:228-243. 3. Coronary artery calcifications. Aortic Atherosclerosis (ICD10-I70.0).  ROS-see HPI   + = positive Constitutional:    weight loss, night sweats, fevers, chills, +fatigue, lassitude. HEENT:    headaches, difficulty swallowing, tooth/dental problems, sore throat,  sneezing, itching, ear ache, nasal congestion, post nasal drip, snoring CV:    chest pain, orthopnea, PND, swelling in lower extremities, anasarca,                                   dizziness, palpitations Resp:   +shortness of  breath with exertion or at rest.                productive cough,   non-productive cough, coughing up of blood.              change in color of mucus.  wheezing.   Skin:    rash or lesions. GI:  No-   heartburn, indigestion, abdominal pain, nausea, vomiting, diarrhea,                 change in bowel habits, loss of appetite GU: dysuria, change in color of urine, no urgency or frequency.   flank pain. MS:   joint pain, stiffness, decreased range of motion, back pain. Neuro-     nothing unusual Psych:  change in mood or affect.  depression or anxiety.   memory loss.  OBJ- Physical Exam General- Alert, Oriented, Affect-appropriate, Distress- none acute, petite, looks well Skin- rash-none, lesions- none, excoriation- none Lymphadenopathy- none Head- atraumatic            Eyes- Gross vision intact, PERRLA, conjunctivae and secretions clear            Ears- Hearing, canals-normal            Nose- Clear, no-Septal dev, mucus, polyps, erosion, perforation             Throat- Mallampati II , mucosa clear , drainage- none, tonsils- atrophic Neck- flexible , trachea midline, no stridor , thyroid nl, carotid no bruit Chest - symmetrical excursion , unlabored           Heart/CV- RRR , no murmur , no gallop  , no rub, nl s1 s2                           - JVD- none , edema- none, stasis changes- none, varices- none           Lung- clear to P&A, wheeze- none, cough- none , dullness-none, rub- none           Chest wall-  Abd-  Br/ Gen/ Rectal- Not done, not indicated Extrem- cyanosis- none, clubbing, none, atrophy- none, strength- nl Neuro- grossly intact to observation

## 2020-09-18 ENCOUNTER — Telehealth: Payer: Self-pay | Admitting: *Deleted

## 2020-09-18 ENCOUNTER — Other Ambulatory Visit: Payer: Self-pay

## 2020-09-18 ENCOUNTER — Encounter: Payer: Self-pay | Admitting: Internal Medicine

## 2020-09-18 ENCOUNTER — Ambulatory Visit (INDEPENDENT_AMBULATORY_CARE_PROVIDER_SITE_OTHER): Payer: Medicare Other | Admitting: Internal Medicine

## 2020-09-18 VITALS — BP 112/70 | HR 64 | Temp 98.0°F | Ht 62.5 in | Wt 134.0 lb

## 2020-09-18 DIAGNOSIS — R0600 Dyspnea, unspecified: Secondary | ICD-10-CM | POA: Diagnosis not present

## 2020-09-18 DIAGNOSIS — I25119 Atherosclerotic heart disease of native coronary artery with unspecified angina pectoris: Secondary | ICD-10-CM | POA: Diagnosis not present

## 2020-09-18 DIAGNOSIS — R0609 Other forms of dyspnea: Secondary | ICD-10-CM

## 2020-09-18 DIAGNOSIS — R911 Solitary pulmonary nodule: Secondary | ICD-10-CM | POA: Diagnosis not present

## 2020-09-18 DIAGNOSIS — IMO0001 Reserved for inherently not codable concepts without codable children: Secondary | ICD-10-CM

## 2020-09-18 MED ORDER — NEXIUM 40 MG PO CPDR: 40.0000 mg | DELAYED_RELEASE_CAPSULE | Freq: Two times a day (BID) | ORAL | 3 refills | Status: DC

## 2020-09-18 NOTE — Patient Instructions (Addendum)
Order- schedule HRCT in 6 months   Dx lung nodule  Please call if we can help

## 2020-09-18 NOTE — Telephone Encounter (Signed)
Patient sent a my chart message stating that she needs once again her brand name Nexium sent in to both [facility] on [address] and [facility] order pharmacy. She is saying they need a prior auth for brand name which has already been done and approved but the copay was too high. Resubmitting rx refills for brand name as requested by pt.

## 2020-09-20 DIAGNOSIS — M25552 Pain in left hip: Secondary | ICD-10-CM | POA: Diagnosis not present

## 2020-09-20 DIAGNOSIS — M5459 Other low back pain: Secondary | ICD-10-CM | POA: Diagnosis not present

## 2020-09-20 NOTE — Telephone Encounter (Signed)
Called [employer] back, they need a second prior authorization due to [place] sending in pantoprazole for her   Doing this with  representative  over the phone    Ava from [place] Name Nexium #60 for 30   Pantoprazole makes patient sick    They tried to get me to go through [place] [facility] [employer] to do prior auth and I refused, Only will do with a representative  now    24 hour review    Ref number    # [id number]

## 2020-09-20 NOTE — Telephone Encounter (Signed)
Inbound call from [place] with patient's insurance stating they are going to be faxing over another prior authorization form; because there is an interaction between pantoprazole and Nexium and since patient picked up pantoprazole she is not able to get Nexium without prior authorization stating why she is needing that medication sooner.  Can be faxed back to [phone] or can also call [phone] instead.

## 2020-09-21 NOTE — Telephone Encounter (Signed)
Pt aware.

## 2020-09-21 NOTE — Telephone Encounter (Signed)
We received approval for BRAND NAME [place] good until 06/09/2021. Will send in to be scanned in and contact the patient

## 2020-09-25 DIAGNOSIS — H2511 Age-related nuclear cataract, right eye: Secondary | ICD-10-CM | POA: Diagnosis not present

## 2020-09-26 DIAGNOSIS — H2512 Age-related nuclear cataract, left eye: Secondary | ICD-10-CM | POA: Diagnosis not present

## 2020-10-12 DIAGNOSIS — U071 COVID-19: Secondary | ICD-10-CM | POA: Diagnosis not present

## 2020-10-13 ENCOUNTER — Other Ambulatory Visit: Payer: Self-pay | Admitting: Gastroenterology

## 2020-10-23 ENCOUNTER — Ambulatory Visit: Payer: Medicare Other | Admitting: Gastroenterology

## 2020-10-27 DIAGNOSIS — H2512 Age-related nuclear cataract, left eye: Secondary | ICD-10-CM | POA: Diagnosis not present

## 2020-10-31 ENCOUNTER — Other Ambulatory Visit: Payer: Self-pay | Admitting: Internal Medicine

## 2020-10-31 DIAGNOSIS — Z1231 Encounter for screening mammogram for malignant neoplasm of breast: Secondary | ICD-10-CM

## 2020-11-07 DIAGNOSIS — C689 Malignant neoplasm of urinary organ, unspecified: Secondary | ICD-10-CM | POA: Diagnosis not present

## 2020-11-07 DIAGNOSIS — E785 Hyperlipidemia, unspecified: Secondary | ICD-10-CM | POA: Diagnosis not present

## 2020-11-07 DIAGNOSIS — G4700 Insomnia, unspecified: Secondary | ICD-10-CM | POA: Diagnosis not present

## 2020-11-07 DIAGNOSIS — I2510 Atherosclerotic heart disease of native coronary artery without angina pectoris: Secondary | ICD-10-CM | POA: Diagnosis not present

## 2020-11-07 DIAGNOSIS — J45909 Unspecified asthma, uncomplicated: Secondary | ICD-10-CM | POA: Diagnosis not present

## 2020-11-07 DIAGNOSIS — K219 Gastro-esophageal reflux disease without esophagitis: Secondary | ICD-10-CM | POA: Diagnosis not present

## 2020-11-20 NOTE — Assessment & Plan Note (Addendum)
DOE not well explained by PFT or CT chest. She might have minimal COPD. Did very well on walk test here.   Plan- Observation for now. Avoid over-training at gym

## 2020-11-21 DIAGNOSIS — K580 Irritable bowel syndrome with diarrhea: Secondary | ICD-10-CM | POA: Diagnosis not present

## 2020-11-21 DIAGNOSIS — M797 Fibromyalgia: Secondary | ICD-10-CM | POA: Diagnosis not present

## 2020-11-21 DIAGNOSIS — K219 Gastro-esophageal reflux disease without esophagitis: Secondary | ICD-10-CM | POA: Diagnosis not present

## 2020-11-21 DIAGNOSIS — K224 Dyskinesia of esophagus: Secondary | ICD-10-CM | POA: Diagnosis not present

## 2020-11-24 ENCOUNTER — Encounter: Payer: Self-pay | Admitting: Internal Medicine

## 2020-11-24 DIAGNOSIS — R918 Other nonspecific abnormal finding of lung field: Secondary | ICD-10-CM | POA: Insufficient documentation

## 2020-11-24 DIAGNOSIS — IMO0001 Reserved for inherently not codable concepts without codable children: Secondary | ICD-10-CM | POA: Insufficient documentation

## 2020-11-24 NOTE — Assessment & Plan Note (Signed)
Stable now. Followed by Cardiology.

## 2020-11-24 NOTE — Assessment & Plan Note (Addendum)
Incidental 28mm LLL nodule on CT chest.08/09/20 Plan- repeat CT in  6 months

## 2020-12-06 DIAGNOSIS — M13849 Other specified arthritis, unspecified hand: Secondary | ICD-10-CM | POA: Diagnosis not present

## 2020-12-06 DIAGNOSIS — M79645 Pain in left finger(s): Secondary | ICD-10-CM | POA: Diagnosis not present

## 2020-12-06 DIAGNOSIS — M79644 Pain in right finger(s): Secondary | ICD-10-CM | POA: Diagnosis not present

## 2020-12-06 DIAGNOSIS — M13841 Other specified arthritis, right hand: Secondary | ICD-10-CM | POA: Diagnosis not present

## 2020-12-27 ENCOUNTER — Other Ambulatory Visit: Payer: Self-pay

## 2020-12-27 ENCOUNTER — Ambulatory Visit
Admission: RE | Admit: 2020-12-27 | Discharge: 2020-12-27 | Disposition: A | Discharge status: Home / Self Care | Payer: Medicare Other | Source: Ambulatory Visit | Attending: Internal Medicine | Admitting: Internal Medicine

## 2020-12-27 DIAGNOSIS — Z1231 Encounter for screening mammogram for malignant neoplasm of breast: Secondary | ICD-10-CM

## 2021-01-11 DIAGNOSIS — C674 Malignant neoplasm of posterior wall of bladder: Secondary | ICD-10-CM | POA: Diagnosis not present

## 2021-01-11 DIAGNOSIS — Z9189 Other specified personal risk factors, not elsewhere classified: Secondary | ICD-10-CM | POA: Diagnosis not present

## 2021-01-23 DIAGNOSIS — H26493 Other secondary cataract, bilateral: Secondary | ICD-10-CM | POA: Diagnosis not present

## 2021-01-25 ENCOUNTER — Ambulatory Visit: Payer: Medicare Other | Admitting: Gastroenterology

## 2021-01-26 DIAGNOSIS — M19071 Primary osteoarthritis, right ankle and foot: Secondary | ICD-10-CM | POA: Diagnosis not present

## 2021-02-15 ENCOUNTER — Ambulatory Visit: Payer: Medicare Other | Admitting: Psychiatry

## 2021-03-01 DIAGNOSIS — D235 Other benign neoplasm of skin of trunk: Secondary | ICD-10-CM | POA: Diagnosis not present

## 2021-03-01 DIAGNOSIS — D485 Neoplasm of uncertain behavior of skin: Secondary | ICD-10-CM | POA: Diagnosis not present

## 2021-03-01 DIAGNOSIS — L119 Acantholytic disorder, unspecified: Secondary | ICD-10-CM | POA: Diagnosis not present

## 2021-03-01 DIAGNOSIS — L821 Other seborrheic keratosis: Secondary | ICD-10-CM | POA: Diagnosis not present

## 2021-03-06 ENCOUNTER — Telehealth: Payer: Self-pay | Admitting: Interventional Cardiology

## 2021-03-06 DIAGNOSIS — H18413 Arcus senilis, bilateral: Secondary | ICD-10-CM | POA: Diagnosis not present

## 2021-03-06 DIAGNOSIS — H26492 Other secondary cataract, left eye: Secondary | ICD-10-CM | POA: Diagnosis not present

## 2021-03-06 DIAGNOSIS — H18513 Endothelial corneal dystrophy, bilateral: Secondary | ICD-10-CM | POA: Diagnosis not present

## 2021-03-06 DIAGNOSIS — Z961 Presence of intraocular lens: Secondary | ICD-10-CM | POA: Diagnosis not present

## 2021-03-06 NOTE — Telephone Encounter (Signed)
Patient c/o Palpitations:  High priority if patient c/o lightheadedness, shortness of breath, or chest pain  How long have you had palpitations/irregular HR/ Afib? Are you having the symptoms now? Twice a week for the past 4-6 weeks, yes   Are you currently experiencing lightheadedness, SOB or CP? no  Do you have a history of afib (atrial fibrillation) or irregular heart rhythm? yes  Have you checked your BP or HR? (document readings if available): no  Are you experiencing any other symptoms? fatigue

## 2021-03-06 NOTE — Telephone Encounter (Signed)
Left message to call back  

## 2021-03-06 NOTE — Telephone Encounter (Signed)
Pt states she has been having palps about 2 times per week for the last 4-6 weeks.  Episodes usually last awhile.  For example, she went to bed with palps one night and they were still there when she woke up the next morning.  Also recently went on a trip to [place] and had palps the entire drive home.  Occasionally has some lightheadedness but not every time.  Feels more fatigued than she use to.  States she normally can just push through to get things done but is having a harder time with that now.  Notices palps more at night.  Denies CP or SOB.  Denies caffeine intake.  Recently checked vitals and BP was a little high but states it's a new cuff and it was painful when it pumped up.  HR was in the 60s.  Wore an event monitor in 2018 for palps and it revealed rare PVCs.  Advised pt I will send message to Dr. Tamala Julian for review and advisement.

## 2021-03-07 ENCOUNTER — Telehealth: Payer: Self-pay | Admitting: Interventional Cardiology

## 2021-03-07 NOTE — Telephone Encounter (Signed)
STAT if HR is under 50 or over 120 (normal HR is 60-100 beats per minute)  What is your heart rate?   Do you have a log of your heart rate readings (document readings)?   Do you have any other symptoms? Patient is exteremly tired   Patient was in the hospital with her husband who had a stroke. While up there the dr check her pulse and told her she needs to see her heart dr sooner then later. Dr stated that her pulse was beating at a rapid pace. Please

## 2021-03-07 NOTE — Telephone Encounter (Signed)
Pt coming in tomorrow to see Dr. Tamala Julian.

## 2021-03-07 NOTE — Telephone Encounter (Signed)
Spoke with pt and scheduled her to come in to see Dr. Tamala Julian tomorrow for eval and EKG to check for AF.  Pt appreciative for call.

## 2021-03-07 NOTE — Progress Notes (Signed)
Cardiology Office Note:    Date:  03/08/2021   ID:  [name], DOB 1951/01/16, MRN [id number]  PCP:  Scheryl Marten, [place]  Cardiologist:  Sinclair Grooms, MD   Referring MD: Scheryl Marten, [place]   Chief Complaint  Patient presents with   Coronary Artery Disease   Follow-up    Palpitations.    History of Present Illness:    [name] is a 70 y.o. female with a hx of hyperlipidemia, severe diffuse CAD, intermittent exertional angina, and anxiety disorder.   Over the past 3 weeks, [name] has experienced intermittent palpitations, palpable irregular heartbeat, and exertional fatigue.  She has not noted specific racing heart and has not had chest pain.  No episodes of syncope although she has not been able to walk as far as she would ordinarily.  She denies orthopnea, PND, ankle edema, nitroglycerin use, and change in anginal pattern which has been relatively mild.  Past Medical History:  Diagnosis Date   Anxiety    Psychiatry Dr. Stan Head, concerns for memory loss, anxiety   Bladder tumor    Broken heart syndrome    Cancer ([place])    bladder CA   Chronic chest pain    Coronary atherosclerosis CARDIOLOGIST-  DR Daneen Schick   Cath 11/2006 demonstrating moderate obstructive coronary disease with 70-80% septal perforator #1, 50-70% first diagonal, 80% third diagonal, 50% mid and distal LAD & heavy calcification throughout all 3 coronaries. LVF normal.   Esophageal motility disorder    Essential hypertension, benign    Fatty liver 04/28/12   GERD (gastroesophageal reflux disease)    H/O esophageal spasm    Heart murmur    History of esophageal spasm    takes ranexa   History of TIA (transient ischemic attack)    2011--  no residuals   Hyperlipidemia    IBS (irritable bowel syndrome)    Diarrhea predominent, esophageal spasm severe, GERD - Dr. Elicia Lamp   Interstitial cystitis    Dr. Gaynelle Arabian   Memory loss    Psychiatry Dr. Stan Head, concerns for  memory loss, anxiety   PONV (postoperative nausea and vomiting)    AND URINARY RETENTION   Sensation of pressure in bladder area    TMJ (temporomandibular joint syndrome)    Wears glasses     Past Surgical History:  Procedure Laterality Date   ABDOMINAL HYSTERECTOMY  AGE 75 -- [place]   AND OVARIAN CYSTECTOMY   BENIGN EXCISIONAL BREAST BX  1996   BREAST EXCISIONAL BIOPSY Right [place]  11-28-2006   DR Mallie Mussel Frank [place]    moderate cad/  70-80% septal perforator #1, 50-70% first diagonal, 80% third diagonal, 50% mid and distal LAD & heavy calcification throughout all 3 coronaries. LVF normal.   CARDIAC CATHETERIZATION  03-26-2012   DR Daneen Schick   patent CFX,  RCA,  & pLAD/  mLAD >50% to <70% a focal eccentric region that overlaps the third diagonal/  90% diagonal ostial and unchanged from prior study /   ef 65%   CARPAL TUNNEL RELEASE Bilateral    CYSTO WITH HYDRODISTENSION N/A 01/14/2014   Procedure: CYSTOSCOPY/HYDRODISTENSION/INSERTION OF MARCAINE AND PYRIDUIM INTO BLADDER/INJECTION OF MARCAINE AND KENALOG into sub trigone space ;  Surgeon: Ailene Rud, MD;  Location: [facility];  Service: Urology;  Laterality: N/A;   CYSTO/  HYDRODISTENTION/  INSTILLATION THERAPY  03-12-2010   ESOPHAGOGASTRODUODENOSCOPY  04/30/2012  Procedure: ESOPHAGOGASTRODUODENOSCOPY (EGD);  Surgeon: Lafayette Dragon, MD;  Location: [facility];  Service: Endoscopy;  Laterality: N/A;   LEFT HEART CATH AND CORONARY ANGIOGRAPHY N/A 02/18/2017   Procedure: LEFT HEART CATH AND CORONARY ANGIOGRAPHY;  Surgeon: Belva Crome, MD;  Location: [facility];  Service: Cardiovascular;  Laterality: N/A;   LUMBAR [place]   NEGATIVE SLEEP STUDY  2010   SHOULDER ARTHROSCOPY Left 12-23-2011   SHOULDER SURGERY Right    TRANSTHORACIC ECHOCARDIOGRAM  04-21-2012   GRADE I DIASTOLIC DYSFUNCTION/  EF 60-65%/  MILD MR  &  TR   TRANSURETHRAL RESECTION OF BLADDER  TUMOR Right 01/03/2015   Procedure: TRANSURETHRAL RESECTION OF BLADDER TUMOR (TURBT);  Surgeon: Carolan Clines, MD;  Location: [facility];  Service: Urology;  Laterality: Right;    Current Medications: Current Meds  Medication Sig   aspirin 81 MG tablet Take 81 mg by mouth at bedtime.    atorvastatin (LIPITOR) 40 MG tablet TAKE 1 TABLET EVERY DAY   cyclobenzaprine (FLEXERIL) 10 MG tablet Take 10 mg by mouth as needed.   dicyclomine (BENTYL) 10 MG capsule TAKE 1 CAPSULE(10 MG) BY MOUTH TWICE DAILY AS NEEDED FOR SPASMS   Difluprednate 0.05 % EMUL Place 1 drop into the left eye 4 (four) times daily.   Digestive Enzymes (MULTI-ENZYME) TABS Take 1 tablet by mouth as needed.   diltiazem 2 % GEL Apply 1 application topically as needed. For rectal spasm   ezetimibe (ZETIA) 10 MG tablet TAKE 1 TABLET (10 MG TOTAL) BY MOUTH DAILY.   hyoscyamine (LEVSIN SL) 0.125 MG SL tablet Take 1 tablet (0.125 mg total) by mouth daily as needed. Please schedule office visit.   ibuprofen (ADVIL,MOTRIN) 200 MG tablet Take 600 mg by mouth every 6 (six) hours as needed for headache or moderate pain.   linaclotide (LINZESS) 290 MCG CAPS capsule Take 1 capsule (290 mcg total) by mouth daily before breakfast.   nebivolol (BYSTOLIC) 2.5 MG tablet Take 1 tablet (2.5 mg total) by mouth daily.   NEXIUM 40 MG capsule Take 1 capsule (40 mg total) by mouth 2 (two) times daily before a meal. BRAND NAME ONLY   nitroGLYCERIN (NITROSTAT) 0.4 MG SL tablet place 1 tablet under the tongue if needed every 5 minutes for chest pain for 3 doses IF NO RELIEF AFTER 3RD DOSE CALL PRESCRIBER OR 911.   oxyCODONE (OXY IR/ROXICODONE) 5 MG immediate release tablet Take 5 mg by mouth as needed for moderate pain.   Peppermint Oil 90 MG CPCR Take 2 capsules by mouth as needed.   Probiotic Product (PROBIOTIC ACIDOPHILUS) CAPS Take 1 capsule by mouth daily.   QUEtiapine (SEROQUEL) 25 MG tablet TAKE 1 TABLET AT BEDTIME   ranolazine  (RANEXA) 500 MG 12 hr tablet Take 1 tablet (500 mg total) by mouth 2 (two) times daily.   traMADol (ULTRAM) 50 MG tablet Take 50 mg by mouth every 6 (six) hours as needed.   Vitamin D-Vitamin K (K2 PLUS D3) [phone] MCG-UNIT TABS 1 tablet     Allergies:   Clindamycin/lincomycin, Doxycycline, Epinephrine, Escitalopram oxalate, Macrodantin, Sulfa antibiotics, Trazodone, Trazodone and nefazodone, Trimethoprim, Lincomycin, Nitrofurantoin, and Nitrofurantoin macrocrystal   Social History   Socioeconomic History   Marital status: Married    Spouse name: Not on file   Number of children: 2   Years of education: Not on file   Highest education level: Not on file  Occupational History   Occupation: caregiver  Tobacco  Use   Smoking status: Former    Years: 10.00    Types: Cigarettes    Quit date: 06/10/1973    Years since quitting: 47.7   Smokeless tobacco: Never  Vaping Use   Vaping Use: Never used  Substance and Sexual Activity   Alcohol use: Yes    Alcohol/week: 2.0 standard drinks    Types: 2 Glasses of wine per week    Comment: social   Drug use: No   Sexual activity: Yes  Other Topics Concern   Not on file  Social History Narrative   Not on file   Social Determinants of Health   Financial Resource Strain: Not on file  Food Insecurity: Not on file  Transportation Needs: Not on file  Physical Activity: Not on file  Stress: Not on file  Social Connections: Not on file     Family History: The patient's family history includes Alzheimer's disease in her mother; Breast cancer (age of onset: 54) in her sister; CAD in her father and mother; Colon polyps in her mother; Dementia in her mother; Depression in an other family member; Diverticulosis in her father; Heart disease in her brother, father, and mother; Obesity in an other family member; Osteoarthritis in an other family member. There is no history of Colon cancer, Esophageal cancer, Stomach cancer, or Rectal cancer.  ROS:    Please see the history of present illness.    Husband had a stroke this week.  When at the hospital with him she had an increase awareness of irregular heart beating.  His physician felt her pulse and noted that there was significant irregularity but no racing.  This led Genora to call for today's office visit.  All other systems reviewed and are negative.  EKGs/Labs/Other Studies Reviewed:    The following studies were reviewed today: No recent cardiac imaging.  Prior cardiac monitoring 2018: Study Highlights    Normal sinus rhythm Rare PVC No correlation between complaints and rhythm No atrial fib, VT, or bradycardia.   Normal study   EKG:  EKG sinus bradycardia 59 bpm with normal appearance.  Recent Labs: No results found for requested labs within last 8760 hours.  Recent Lipid Panel    Component Value Date/Time   CHOL 118 11/03/2018 1029   TRIG 90 11/03/2018 1029   HDL 50 11/03/2018 1029   CHOLHDL 2.4 11/03/2018 1029   CHOLHDL 2.4 02/18/2017 0824   VLDL 17 02/18/2017 0824   LDLCALC 50 11/03/2018 1029    Physical Exam:    VS:  BP 118/64   Pulse (!) 59   Ht 5' 0.25" (1.53 m)   Wt 127 lb 3.2 oz (57.7 kg)   BMI 24.64 kg/m     Wt Readings from Last 3 Encounters:  03/08/21 127 lb 3.2 oz (57.7 kg)  09/18/20 134 lb (60.8 kg)  07/24/20 132 lb (59.9 kg)     GEN: Healthy appearing. No acute distress HEENT: Normal NECK: No JVD. LYMPHATICS: No lymphadenopathy CARDIAC: No murmur. RRR no gallop, or edema. VASCULAR:  Normal Pulses. No bruits. RESPIRATORY:  Clear to auscultation without rales, wheezing or rhonchi  ABDOMEN: Soft, non-tender, non-distended, No pulsatile mass, MUSCULOSKELETAL: No deformity  SKIN: Warm and dry NEUROLOGIC:  Alert and oriented x 3 PSYCHIATRIC:  Normal affect   ASSESSMENT:    1. Palpitations   2. Chest pain of uncertain etiology   3. Coronary artery disease involving native coronary artery of native heart without angina pectoris   4.  Essential  hypertension   5. Other hyperlipidemia   6. Pulmonary emphysema, unspecified emphysema type ([place])   7. Heart rate fast    PLAN:    In order of problems listed above:  Palpitations and irregular heartbeat.  4-week monitor/event. Not currently having chest pain Stable without exertional angina.  Continue secondary prevention. Blood pressure control is excellent today.  Continue Lipitor 40 mg/day.   Most recent LDL was 62 in December. Not discussed   Medication Adjustments/Labs and Tests Ordered: Current medicines are reviewed at length with the patient today.  Concerns regarding medicines are outlined above.  Orders Placed This Encounter  Procedures   Cardiac event monitor   EKG 12-Lead   No orders of the defined types were placed in this encounter.   Patient Instructions  Medication Instructions:  Your physician recommends that you continue on your current medications as directed. Please refer to the Current Medication list given to you today.  *If you need a refill on your cardiac medications before your next appointment, please call your pharmacy*   Lab Work: None If you have labs (blood work) drawn today and your tests are completely normal, you will receive your results only by: [place] (if you have MyChart) OR A paper copy in the mail If you have any lab test that is abnormal or we need to change your treatment, we will call you to review the results.   Testing/Procedures: Your physician has recommended that you wear an event monitor. Event monitors are medical devices that record the heart's electrical activity. Doctors most often [place] these monitors to diagnose arrhythmias. Arrhythmias are problems with the speed or rhythm of the heartbeat. The monitor is a small, portable device. You can wear one while you do your normal daily activities. This is usually used to diagnose what is causing palpitations/syncope (passing out).   Follow-Up: At [facility], you and your health needs are our priority.  As part of our continuing mission to provide you with exceptional heart care, we have created designated Provider Care Teams.  These Care Teams include your primary Cardiologist (physician) and Advanced Practice Providers (APPs -  Physician Assistants and Nurse Practitioners) who all work together to provide you with the care you need, when you need it.  We recommend signing up for the patient portal called "MyChart".  Sign up information is provided on this After Visit Summary.  MyChart is used to connect with patients for Virtual Visits (Telemedicine).  Patients are able to view lab/test results, encounter notes, upcoming appointments, etc.  Non-urgent messages can be sent to your provider as well.   To learn more about what you can do with MyChart, go to [email or web address].    Your next appointment:   6-8 month(s)  The format for your next appointment:   In Person  Provider:   You may see Sinclair Grooms, MD or one of the following Advanced Practice Providers on your designated Care Team:   Cecilie Kicks, NP   Other Instructions [employer] Cardiac Event Monitor Instructions Your physician has requested you wear your cardiac event monitor for 30 days. [employer] may call or text to confirm a shipping address. The monitor will be sent to a land address via UPS. [employer] will not ship a monitor to a PO BOX. It typically takes 3-5 days to receive your monitor after it has been enrolled. [employer] will assist with USPS tracking if your package is delayed. The telephone number for [employer] is [phone]. Once you  have received your monitor, please review the enclosed instructions. Instruction tutorials can also be viewed under help and settings on the enclosed cell phone. Your monitor has already been registered assigning a specific monitor serial # to you.  Applying the monitor Remove cell phone from case and turn it on.  The cell phone works as Dealer and needs to be within [employer] of you at all times. The cell phone will need to be charged on a daily basis. We recommend you plug the cell phone into the enclosed charger at your bedside table every night.  Monitor batteries: You will receive two monitor batteries labelled #1 and #2. These are your recorders. Plug battery #2 onto the second connection on the enclosed charger. Keep one battery on the charger at all times. This will keep the monitor battery deactivated. It will also keep it fully charged for when you need to switch your monitor batteries. A small light will be blinking on the battery emblem when it is charging. The light on the battery emblem will remain on when the battery is fully charged.  Open package of a Monitor strip. Insert battery #1 into black hood on strip and gently squeeze monitor battery onto connection as indicated in instruction booklet. Set aside while preparing skin.  Choose location for your strip, vertical or horizontal, as indicated in the instruction booklet. Shave to remove all hair from location. There cannot be any lotions, oils, powders, or colognes on skin where monitor is to be applied. Wipe skin clean with enclosed Saline wipe. Dry skin completely.  Peel paper labeled #1 off the back of the Monitor strip exposing the adhesive. Place the monitor on the chest in the vertical or horizontal position shown in the instruction booklet. One arrow on the monitor strip must be pointing upward. Carefully remove paper labeled #2, attaching remainder of strip to your skin. Try not to create any folds or wrinkles in the strip as you apply it.  Firmly press and release the circle in the center of the monitor battery. You will hear a small beep. This is turning the monitor battery on. The heart emblem on the monitor battery will light up every 5 seconds if the monitor battery in turned on and connected to the patient  securely. Do not push and hold the circle down as this turns the monitor battery off. The cell phone will locate the monitor battery. A screen will appear on the cell phone checking the connection of your monitor strip. This may read poor connection initially but change to good connection within the next minute. Once your monitor accepts the connection you will hear a series of 3 beeps followed by a climbing crescendo of beeps. A screen will appear on the cell phone showing the two monitor strip placement options. Touch the picture that demonstrates where you applied the monitor strip.  Your monitor strip and battery are waterproof. You are able to shower, bathe, or swim with the monitor on. They just ask you do not submerge deeper than 3 feet underwater. We recommend removing the monitor if you are swimming in a lake, river, or ocean.  Your monitor battery will need to be switched to a fully charged monitor battery approximately once a week. The cell phone will alert you of an action which needs to be made.  On the cell phone, tap for details to reveal connection status, monitor battery status, and cell phone battery status. The green dots indicates your monitor is  in good status. A red dot indicates there is something that needs your attention.  To record a symptom, click the circle on the monitor battery. In 30-60 seconds a list of symptoms will appear on the cell phone. Select your symptom and tap save. Your monitor will record a sustained or significant arrhythmia regardless of you clicking the button. Some patients do not feel the heart rhythm irregularities. [employer] will notify us of any serious or critical events.  Refer to instruction booklet for instructions on switching batteries, changing strips, the Do not disturb or Pause features, or any additional questions.  Call [employer] at [phone], to confirm your monitor is transmitting and record your baseline. They  will answer any questions you may have regarding the monitor instructions at that time.  Returning the monitor to [place] all equipment back into blue box. Peel off strip of paper to expose adhesive and close box securely. There is a prepaid UPS shipping label on this box. Drop in a UPS drop box, or at a UPS facility like [employer]. You may also contact [employer] to arrange UPS to pick up monitor package at your home.     Signed, Sinclair Grooms, MD  03/08/2021 3:15 PM    [facility]

## 2021-03-08 ENCOUNTER — Encounter: Payer: Self-pay | Admitting: Interventional Cardiology

## 2021-03-08 ENCOUNTER — Ambulatory Visit (INDEPENDENT_AMBULATORY_CARE_PROVIDER_SITE_OTHER): Payer: Medicare Other | Admitting: Interventional Cardiology

## 2021-03-08 ENCOUNTER — Encounter: Payer: Self-pay | Admitting: Radiology

## 2021-03-08 ENCOUNTER — Other Ambulatory Visit: Payer: Self-pay

## 2021-03-08 VITALS — BP 118/64 | HR 59 | Ht 60.25 in | Wt 127.2 lb

## 2021-03-08 DIAGNOSIS — I25119 Atherosclerotic heart disease of native coronary artery with unspecified angina pectoris: Secondary | ICD-10-CM

## 2021-03-08 DIAGNOSIS — E7849 Other hyperlipidemia: Secondary | ICD-10-CM

## 2021-03-08 DIAGNOSIS — I2510 Atherosclerotic heart disease of native coronary artery without angina pectoris: Secondary | ICD-10-CM | POA: Diagnosis not present

## 2021-03-08 DIAGNOSIS — I10 Essential (primary) hypertension: Secondary | ICD-10-CM | POA: Diagnosis not present

## 2021-03-08 DIAGNOSIS — R002 Palpitations: Secondary | ICD-10-CM

## 2021-03-08 DIAGNOSIS — R079 Chest pain, unspecified: Secondary | ICD-10-CM

## 2021-03-08 DIAGNOSIS — R000 Tachycardia, unspecified: Secondary | ICD-10-CM | POA: Diagnosis not present

## 2021-03-08 DIAGNOSIS — J439 Emphysema, unspecified: Secondary | ICD-10-CM

## 2021-03-08 NOTE — Progress Notes (Signed)
Enrolled patient for a 30 day [employer] Event Monitor to be mailed to patients home  

## 2021-03-08 NOTE — Patient Instructions (Signed)
Medication Instructions:  Your physician recommends that you continue on your current medications as directed. Please refer to the Current Medication list given to you today.  *If you need a refill on your cardiac medications before your next appointment, please call your pharmacy*   Lab Work: None If you have labs (blood work) drawn today and your tests are completely normal, you will receive your results only by: [place] (if you have MyChart) OR A paper copy in the mail If you have any lab test that is abnormal or we need to change your treatment, we will call you to review the results.   Testing/Procedures: Your physician has recommended that you wear an event monitor. Event monitors are medical devices that record the heart's electrical activity. Doctors most often [place] these monitors to diagnose arrhythmias. Arrhythmias are problems with the speed or rhythm of the heartbeat. The monitor is a small, portable device. You can wear one while you do your normal daily activities. This is usually used to diagnose what is causing palpitations/syncope (passing out).   Follow-Up: At [facility], you and your health needs are our priority.  As part of our continuing mission to provide you with exceptional heart care, we have created designated Provider Care Teams.  These Care Teams include your primary Cardiologist (physician) and Advanced Practice Providers (APPs -  Physician Assistants and Nurse Practitioners) who all work together to provide you with the care you need, when you need it.  We recommend signing up for the patient portal called "MyChart".  Sign up information is provided on this After Visit Summary.  MyChart is used to connect with patients for Virtual Visits (Telemedicine).  Patients are able to view lab/test results, encounter notes, upcoming appointments, etc.  Non-urgent messages can be sent to your provider as well.   To learn more about what you can do with MyChart, go  to [email or web address].    Your next appointment:   6-8 month(s)  The format for your next appointment:   In Person  Provider:   You may see Sinclair Grooms, MD or one of the following Advanced Practice Providers on your designated Care Team:   Cecilie Kicks, NP   Other Instructions [employer] Cardiac Event Monitor Instructions Your physician has requested you wear your cardiac event monitor for 30 days. [employer] may call or text to confirm a shipping address. The monitor will be sent to a land address via UPS. [employer] will not ship a monitor to a PO BOX. It typically takes 3-5 days to receive your monitor after it has been enrolled. [employer] will assist with USPS tracking if your package is delayed. The telephone number for [employer] is [phone]. Once you have received your monitor, please review the enclosed instructions. Instruction tutorials can also be viewed under help and settings on the enclosed cell phone. Your monitor has already been registered assigning a specific monitor serial # to you.  Applying the monitor Remove cell phone from case and turn it on. The cell phone works as Dealer and needs to be within [employer] of you at all times. The cell phone will need to be charged on a daily basis. We recommend you plug the cell phone into the enclosed charger at your bedside table every night.  Monitor batteries: You will receive two monitor batteries labelled #1 and #2. These are your recorders. Plug battery #2 onto the second connection on the enclosed charger. Keep one battery on the charger at all  times. This will keep the monitor battery deactivated. It will also keep it fully charged for when you need to switch your monitor batteries. A small light will be blinking on the battery emblem when it is charging. The light on the battery emblem will remain on when the battery is fully charged.  Open package of a Monitor strip. Insert battery #1  into black hood on strip and gently squeeze monitor battery onto connection as indicated in instruction booklet. Set aside while preparing skin.  Choose location for your strip, vertical or horizontal, as indicated in the instruction booklet. Shave to remove all hair from location. There cannot be any lotions, oils, powders, or colognes on skin where monitor is to be applied. Wipe skin clean with enclosed Saline wipe. Dry skin completely.  Peel paper labeled #1 off the back of the Monitor strip exposing the adhesive. Place the monitor on the chest in the vertical or horizontal position shown in the instruction booklet. One arrow on the monitor strip must be pointing upward. Carefully remove paper labeled #2, attaching remainder of strip to your skin. Try not to create any folds or wrinkles in the strip as you apply it.  Firmly press and release the circle in the center of the monitor battery. You will hear a small beep. This is turning the monitor battery on. The heart emblem on the monitor battery will light up every 5 seconds if the monitor battery in turned on and connected to the patient securely. Do not push and hold the circle down as this turns the monitor battery off. The cell phone will locate the monitor battery. A screen will appear on the cell phone checking the connection of your monitor strip. This may read poor connection initially but change to good connection within the next minute. Once your monitor accepts the connection you will hear a series of 3 beeps followed by a climbing crescendo of beeps. A screen will appear on the cell phone showing the two monitor strip placement options. Touch the picture that demonstrates where you applied the monitor strip.  Your monitor strip and battery are waterproof. You are able to shower, bathe, or swim with the monitor on. They just ask you do not submerge deeper than 3 feet underwater. We recommend removing the monitor if you are  swimming in a lake, river, or ocean.  Your monitor battery will need to be switched to a fully charged monitor battery approximately once a week. The cell phone will alert you of an action which needs to be made.  On the cell phone, tap for details to reveal connection status, monitor battery status, and cell phone battery status. The green dots indicates your monitor is in good status. A red dot indicates there is something that needs your attention.  To record a symptom, click the circle on the monitor battery. In 30-60 seconds a list of symptoms will appear on the cell phone. Select your symptom and tap save. Your monitor will record a sustained or significant arrhythmia regardless of you clicking the button. Some patients do not feel the heart rhythm irregularities. [employer] will notify us of any serious or critical events.  Refer to instruction booklet for instructions on switching batteries, changing strips, the Do not disturb or Pause features, or any additional questions.  Call [employer] at [phone], to confirm your monitor is transmitting and record your baseline. They will answer any questions you may have regarding the monitor instructions at that time.  Returning the  monitor to [employer] Place all equipment back into blue box. Peel off strip of paper to expose adhesive and close box securely. There is a prepaid UPS shipping label on this box. Drop in a UPS drop box, or at a UPS facility like [employer]. You may also contact [employer] to arrange UPS to pick up monitor package at your home.

## 2021-03-13 DIAGNOSIS — Z9842 Cataract extraction status, left eye: Secondary | ICD-10-CM | POA: Diagnosis not present

## 2021-03-13 DIAGNOSIS — H18513 Endothelial corneal dystrophy, bilateral: Secondary | ICD-10-CM | POA: Diagnosis not present

## 2021-03-13 DIAGNOSIS — Z961 Presence of intraocular lens: Secondary | ICD-10-CM | POA: Diagnosis not present

## 2021-03-15 ENCOUNTER — Ambulatory Visit (INDEPENDENT_AMBULATORY_CARE_PROVIDER_SITE_OTHER): Payer: Medicare Other

## 2021-03-15 DIAGNOSIS — R002 Palpitations: Secondary | ICD-10-CM

## 2021-03-20 ENCOUNTER — Ambulatory Visit: Payer: Medicare Other | Admitting: Gastroenterology

## 2021-03-27 ENCOUNTER — Ambulatory Visit
Admission: RE | Admit: 2021-03-27 | Discharge: 2021-03-27 | Disposition: A | Discharge status: Home / Self Care | Payer: Medicare Other | Source: Ambulatory Visit | Attending: Internal Medicine | Admitting: Internal Medicine

## 2021-03-27 DIAGNOSIS — R918 Other nonspecific abnormal finding of lung field: Secondary | ICD-10-CM | POA: Diagnosis not present

## 2021-03-27 DIAGNOSIS — R911 Solitary pulmonary nodule: Secondary | ICD-10-CM | POA: Diagnosis not present

## 2021-03-27 DIAGNOSIS — I700 Atherosclerosis of aorta: Secondary | ICD-10-CM | POA: Diagnosis not present

## 2021-03-27 DIAGNOSIS — J479 Bronchiectasis, uncomplicated: Secondary | ICD-10-CM | POA: Diagnosis not present

## 2021-03-31 DIAGNOSIS — Z20822 Contact with and (suspected) exposure to covid-19: Secondary | ICD-10-CM | POA: Diagnosis not present

## 2021-03-31 DIAGNOSIS — Z23 Encounter for immunization: Secondary | ICD-10-CM | POA: Diagnosis not present

## 2021-04-02 ENCOUNTER — Ambulatory Visit: Payer: Medicare Other | Admitting: Internal Medicine

## 2021-04-02 DIAGNOSIS — M25562 Pain in left knee: Secondary | ICD-10-CM | POA: Diagnosis not present

## 2021-04-02 DIAGNOSIS — M2392 Unspecified internal derangement of left knee: Secondary | ICD-10-CM | POA: Diagnosis not present

## 2021-04-03 ENCOUNTER — Other Ambulatory Visit: Payer: Self-pay | Admitting: Student

## 2021-04-03 DIAGNOSIS — M25562 Pain in left knee: Secondary | ICD-10-CM

## 2021-04-10 ENCOUNTER — Encounter: Payer: Self-pay | Admitting: Primary Care

## 2021-04-10 ENCOUNTER — Ambulatory Visit (INDEPENDENT_AMBULATORY_CARE_PROVIDER_SITE_OTHER): Payer: Medicare Other | Admitting: Primary Care

## 2021-04-10 ENCOUNTER — Other Ambulatory Visit: Payer: Self-pay

## 2021-04-10 VITALS — BP 126/66 | HR 73 | Temp 99.2°F | Ht 62.5 in | Wt 124.8 lb

## 2021-04-10 DIAGNOSIS — R0609 Other forms of dyspnea: Secondary | ICD-10-CM

## 2021-04-10 DIAGNOSIS — I25119 Atherosclerotic heart disease of native coronary artery with unspecified angina pectoris: Secondary | ICD-10-CM

## 2021-04-10 DIAGNOSIS — J439 Emphysema, unspecified: Secondary | ICD-10-CM

## 2021-04-10 MED ORDER — TIOTROPIUM BROMIDE MONOHYDRATE 18 MCG IN CAPS: 18.0000 ug | ORAL_CAPSULE | Freq: Every day | RESPIRATORY_TRACT | 0 refills | Status: DC

## 2021-04-10 NOTE — Assessment & Plan Note (Addendum)
-   Former light smoker/second hand exposure. Patient has chronic symptoms of fatigue and exertional dyspnea. No acute respiratory symptoms today. No active cough, chest tightness or wheezing. Pulmonary function testing in October 2021 showed very minimal obstructive airway disease. HRCT chest on 03/28/21 showed findings of air trapping indicative of small airway disease. Overall left lower lobe nodule/scarring remains stable. No new lung nodules. Recommend trial Spiriva Respimat 2.63mcg daily x 2 weeks, if noticable clinical benefit patient instructed to call office and we will send in prescription. Follow-up in 6 months with Dr. Annamaria Boots or sooner if needed

## 2021-04-10 NOTE — Assessment & Plan Note (Addendum)
-   Non-obstructive on previous cath. Maintained on Lipitor 40mg  daily. Follows with cardiology.

## 2021-04-10 NOTE — Progress Notes (Signed)
@Patient  ID: [name], female    DOB: 1950-07-30, 70 y.o.   MRN: [id number]  No chief complaint on file.   Referring provider: Scheryl Marten, PA  HPI: 70 year old female, former light smoker. PMH significant for mild COPD and pulmonary nodule. Patient of Dr. Annamaria Boots, last seen on 09/18/20.   Previous [facility] pulmonary encounter: 09/18/20-70yo  F former smoker (passive smoke exposure to parents as a child, and she smoked some before age 82.) followed for Dyspnea, Lung Nodule, courtesy of Dr Mallie Mussel Smith/ Cardiology. She had complained of extreme fatigue with 30 minutes of physical actiivity, going to gym 4-5x/ week. No chest pain. 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 -----Requesting clarification of CT results.  She understands that CT chest shows no emphysema. There is a 9 mm LLL nodule we will recheck in 6-12 months per Radiology recommendation.  \This recommendation follows the consensus statement: Guidelines for Management of Incidental Pulmonary Nodules Detected on CT Images: From the Fleischner Society 2017; Radiology 2017; 284:228-243. 3. Coronary artery calcifications. Aortic Atherosclerosis (ICD10-I70.0).  04/10/2021- Interim hx  Patient presents today for 6 month follow-up. Hx minimal COPD, lung nodules.  Breathing wise she is doing alright. She has symptoms of fatigue and dyspnea with exertion. She is not able to do as much as she has in the past. he gets  frustrated by this but understands this may be due to age. She gets out of breath with inclines and hills. She stays fairly active. She goes to the gym several days a week, she was out for 2 weeks d/t knee injury. She was given trial Trelegy in the past but admits to not consistently using it. We reviewed most recent HRCT results that showed no evidence of ILD, air trapping. Scarring that remains stable to left lower lobe. No new nodules. Atherosclerosis and coronary artery calcifications. She follows with cardiology, currently wearing holter monitor. Denies f/c/s, active cough, chest tightness or wheezing.   Imaging:  03/28/21 HRCT>> No evidence of ILD, air trapping indicative of small airway disease. Biapical pleuroparenchymal scarring. Mild central bronchiectasis. 70mm subpleural nodule along the left hemidiaphragm is unchanged. Minimal subpleural ground-glass in the superior segment left lower lobe does not have nodular features and is unchanged Arthrosclerosis/coronary artery calcification.   Allergies  Allergen Reactions   Clindamycin/Lincomycin Other (See Comments)    Pt not sure of reaction    Doxycycline Nausea Only and Nausea And Vomiting   Epinephrine    Escitalopram Oxalate Nausea Only   Macrodantin Other (See Comments)    Flushed, hot   Sulfa Antibiotics    Trazodone Nausea Only   Trazodone And Nefazodone Nausea Only   Trimethoprim Other (See Comments)    unknown    Lincomycin Rash    Reaction unknown   Nitrofurantoin Rash    Flushed, hot   Nitrofurantoin Macrocrystal Anxiety    Flushing, hot face and ears    Immunization History  Administered Date(s)  Administered   Fluad Quad(high Dose 65+) 02/06/2019   Influenza Split 06/24/2011, 05/26/2012, 03/09/2014, 05/13/2015, 04/18/2016, 04/11/2017, 03/11/2018, 03/17/2020   Influenza, High Dose Seasonal PF 04/07/2018, 03/11/2019   Influenza,inj,Quad PF,6+ Mos 04/18/2016   Influenza-Unspecified 04/05/2015, 04/23/2017    PFIZER(Purple Top)SARS-COV-2 Vaccination 07/19/2019, 08/13/2019, 05/29/2020   Pneumococcal Conjugate-13 04/26/2016, 04/10/2017   Pneumococcal Polysaccharide-23 04/27/2018   Pneumococcal-Unspecified 04/18/2016   Td 01/23/2005   Tdap 04/26/2016   Zoster Recombinat (Shingrix) 06/24/2017   Zoster, Live 01/01/2017, 06/24/2017    Past Medical History:  Diagnosis Date   Anxiety    Psychiatry Dr. Stan Head, concerns for memory loss, anxiety   Bladder tumor    Broken heart syndrome    Cancer ([place])    bladder CA   Chronic chest pain    Coronary atherosclerosis CARDIOLOGIST-  DR Daneen Schick   Cath 11/2006 demonstrating moderate obstructive coronary disease with 70-80% septal perforator #1, 50-70% first diagonal, 80% third diagonal, 50% mid and distal LAD & heavy calcification throughout all 3 coronaries. LVF normal.   Esophageal motility disorder    Essential hypertension, benign    Fatty liver 04/28/12   GERD (gastroesophageal reflux disease)    H/O esophageal spasm    Heart murmur    History of esophageal spasm    takes ranexa   History of TIA (transient ischemic attack)    2011--  no residuals   Hyperlipidemia    IBS (irritable bowel syndrome)    Diarrhea predominent, esophageal spasm severe, GERD - Dr. Elicia Lamp   Interstitial cystitis    Dr. Gaynelle Arabian   Memory loss    Psychiatry Dr. Stan Head, concerns for memory loss, anxiety   PONV (postoperative nausea and vomiting)    AND URINARY RETENTION   Sensation of pressure in bladder area    TMJ (temporomandibular joint syndrome)    Wears glasses     Tobacco History: Social History   Tobacco Use  Smoking Status Former   Years: 10.00   Types: Cigarettes   Quit date: 06/10/1973   Years since quitting: 47.8  Smokeless Tobacco Never   Counseling given: Not Answered   Outpatient Medications Prior to Visit  Medication Sig Dispense Refill   aspirin 81 MG tablet Take 81 mg by mouth at bedtime.      atorvastatin  (LIPITOR) 40 MG tablet TAKE 1 TABLET EVERY DAY 90 tablet 3   cyclobenzaprine (FLEXERIL) 10 MG tablet Take 10 mg by mouth as needed.     dicyclomine (BENTYL) 10 MG capsule TAKE 1 CAPSULE(10 MG) BY MOUTH TWICE DAILY AS NEEDED FOR SPASMS 180 capsule 0   Difluprednate 0.05 % EMUL Place 1 drop into the left eye 4 (four) times daily.     Digestive Enzymes (MULTI-ENZYME) TABS Take 1 tablet by mouth as needed.     diltiazem 2 % GEL Apply 1 application topically as needed. For rectal spasm 30 g 1   ezetimibe (ZETIA) 10 MG tablet TAKE 1 TABLET (10 MG TOTAL) BY MOUTH DAILY. 90 tablet 3   hyoscyamine (LEVSIN SL) 0.125 MG SL tablet Take 1 tablet (0.125 mg total) by mouth daily as needed. Please schedule office visit. 90 tablet 3   ibuprofen (ADVIL,MOTRIN) 200 MG tablet Take 600 mg by mouth every 6 (six) hours as needed for headache or moderate pain.     linaclotide (LINZESS) 290 MCG CAPS capsule Take 1 capsule (290 mcg total) by mouth daily before breakfast. 90 capsule 3   nebivolol (BYSTOLIC) 2.5 MG tablet Take 1  tablet (2.5 mg total) by mouth daily. 90 tablet 3   NEXIUM 40 MG capsule Take 1 capsule (40 mg total) by mouth 2 (two) times daily before a meal. BRAND NAME ONLY 180 capsule 3   nitroGLYCERIN (NITROSTAT) 0.4 MG SL tablet place 1 tablet under the tongue if needed every 5 minutes for chest pain for 3 doses IF NO RELIEF AFTER 3RD DOSE CALL PRESCRIBER OR 911. 75 tablet 1   oxyCODONE (OXY IR/ROXICODONE) 5 MG immediate release tablet Take 5 mg by mouth as needed for moderate pain.     Peppermint Oil 90 MG CPCR Take 2 capsules by mouth as needed.     Probiotic Product (PROBIOTIC ACIDOPHILUS) CAPS Take 1 capsule by mouth daily.     QUEtiapine (SEROQUEL) 25 MG tablet TAKE 1 TABLET AT BEDTIME 90 tablet 3   ranolazine (RANEXA) 500 MG 12 hr tablet Take 1 tablet (500 mg total) by mouth 2 (two) times daily. 180 tablet 3   traMADol (ULTRAM) 50 MG tablet Take 50 mg by mouth every 6 (six) hours as needed.     Vitamin  D-Vitamin K (K2 PLUS D3) [phone] MCG-UNIT TABS 1 tablet     No facility-administered medications prior to visit.    Review of Systems  Review of Systems  Constitutional:  Positive for fatigue.  HENT: Negative.    Respiratory:  Positive for shortness of breath. Negative for cough, chest tightness and wheezing.   Cardiovascular: Negative.     Physical Exam  BP 126/66 (BP Location: Left Arm, Patient Position: Sitting, Cuff Size: Normal)   Pulse 73   Temp 99.2 F (37.3 C) (Oral)   Ht 5' 2.5" (1.588 m)   Wt 124 lb 12.8 oz (56.6 kg)   SpO2 99%   BMI 22.46 kg/m  Physical Exam Constitutional:      Appearance: Normal appearance.  HENT:     Head: Normocephalic and atraumatic.     Mouth/Throat:     Mouth: Mucous membranes are moist.     Pharynx: Oropharynx is clear.  Cardiovascular:     Rate and Rhythm: Normal rate and regular rhythm.  Pulmonary:     Effort: Pulmonary effort is normal.     Breath sounds: Normal breath sounds. No wheezing, rhonchi or rales.  Musculoskeletal:        General: Normal range of motion.     Cervical back: Normal range of motion and neck supple.  Skin:    General: Skin is warm and dry.  Neurological:     General: No focal deficit present.     Mental Status: She is alert and oriented to person, place, and time. Mental status is at baseline.  Psychiatric:        Mood and Affect: Mood normal.        Behavior: Behavior normal.        Thought Content: Thought content normal.        Judgment: Judgment normal.     Lab Results:  CBC    Component Value Date/Time   WBC 7.3 12/24/2019 1505   RBC 4.16 12/24/2019 1505   HGB 11.9 (L) 12/24/2019 1505   HGB 12.1 11/06/2016 1545   HCT 35.7 (L) 12/24/2019 1505   HCT 37.8 11/06/2016 1545   PLT 267.0 12/24/2019 1505   PLT 281 11/06/2016 1545   MCV 85.8 12/24/2019 1505   MCV 89 11/06/2016 1545   MCH 28.9 01/12/2018 1031   MCHC 33.5 12/24/2019 1505   RDW 15.1 12/24/2019 1505  RDW 14.8 11/06/2016 1545    LYMPHSABS 1.5 12/24/2019 1505   MONOABS 0.6 12/24/2019 1505   EOSABS 0.1 12/24/2019 1505   BASOSABS 0.0 12/24/2019 1505    BMET    Component Value Date/Time   NA 136 12/24/2019 1505   NA 140 11/03/2018 1029   K 4.3 12/24/2019 1505   CL 99 12/24/2019 1505   CO2 31 12/24/2019 1505   GLUCOSE 93 12/24/2019 1505   BUN 13 12/24/2019 1505   BUN 11 11/03/2018 1029   CREATININE 0.90 02/09/2020 1753   CREATININE 0.80 01/12/2018 1031   CALCIUM 9.2 12/24/2019 1505   GFRNONAA 74 11/03/2018 1029   GFRNONAA 77 01/12/2018 1031   GFRAA 86 11/03/2018 1029   GFRAA 89 01/12/2018 1031    BNP    Component Value Date/Time   BNP 553.5 (H) 02/18/2017 0943    ProBNP No results found for: PROBNP  Imaging: CT Chest High Resolution  Result Date: 03/28/2021 CLINICAL DATA:  Lung nodule.  History of bladder cancer. EXAM: CT CHEST WITHOUT CONTRAST TECHNIQUE: Multidetector CT imaging of the chest was performed following the standard protocol without intravenous contrast. High resolution imaging of the lungs, as well as inspiratory and expiratory imaging, was performed. COMPARISON:  08/08/2020. FINDINGS: Cardiovascular: Atherosclerotic calcification of the aorta and coronary arteries. Heart size normal. No pericardial effusion. Mediastinum/Nodes: No pathologically enlarged mediastinal or axillary lymph nodes. Hilar regions are difficult to evaluate without IV contrast but appear grossly unremarkable. Esophagus is grossly unremarkable. Lungs/Pleura: Biapical pleuroparenchymal scarring. Negative for subpleural reticulation, traction bronchiectasis/bronchiolectasis, ground-glass, architectural distortion or honeycombing. Mild central bronchiectasis. Scattered scarring in the lung bases. 6 mm subpleural nodule along the left hemidiaphragm (7/118) is unchanged. No new or suspicious pulmonary nodules. Minimal subpleural ground-glass in the superior segment left lower lobe does not have nodular features (7/61) and  is unchanged. No pleural fluid. Airway is unremarkable. There is air trapping. Upper Abdomen: Visualized portions of the liver, adrenal glands, kidneys, spleen, pancreas and stomach are grossly unremarkable. Musculoskeletal: Degenerative changes in the spine. Pectus deformity. No worrisome lytic or sclerotic lesions. IMPRESSION: 1. No evidence of interstitial lung disease. Air trapping is indicative of small airways disease. 2. Focal ground-glass in the subpleural superior segment left lower lobe does not have nodular features and is likely due to scarring. Difficult to definitively exclude a nodule. Follow-up standard CT chest could be performed in 2 years, as clinically indicated. This recommendation follows the consensus statement: Guidelines for Management of Small Pulmonary Nodules Detected on CT Images: From the Fleischner Society 2017; Radiology 2017; 284:228-243. 3. Aortic atherosclerosis (ICD10-I70.0). Coronary artery calcification. Electronically Signed   By: Lorin Picket M.D.   On: 03/28/2021 16:54     Assessment & Plan:   DOE (dyspnea on exertion) - Former light smoker/second hand exposure. Patient has chronic symptoms of fatigue and exertional dyspnea. No acute respiratory symptoms today. No active cough, chest tightness or wheezing. Pulmonary function testing in October 2021 showed very minimal obstructive airway disease. HRCT chest on 03/28/21 showed findings of air trapping indicative of small airway disease. Overall left lower lobe nodule/scarring remains stable. No new lung nodules. Recommend trial Spiriva Respimat 2.4mcg daily x 2 weeks, if noticable clinical benefit patient instructed to call office and we will send in prescription. Follow-up in 6 months with Dr. Annamaria Boots or sooner if needed   Coronary artery disease involving native coronary artery of native heart with angina pectoris ([place]) - Non-obstructive on previous cath. Maintained on Lipitor 40mg  daily. Follows  with cardiology.    Martyn Ehrich, NP 04/10/2021

## 2021-04-10 NOTE — Patient Instructions (Addendum)
-   Pulmonary function testing in the past showed very mild obstructive airway disease - CT chest showed findings of air trapping or small airway disease which can be seen with COPD. Overall left lower lobe nodule/scarring remains stable. No new lungs nodules  - Recommend trial inhaler called Spiriva respimat, take two puffs daily in the morning x 2 weeks. If you notice benefit please let us know and we can send in prescription   Follow-up: - 6 months with Dr. Annamaria Boots or sooner if needed

## 2021-04-11 ENCOUNTER — Other Ambulatory Visit: Payer: Self-pay | Admitting: Interventional Cardiology

## 2021-04-13 MED ORDER — ATORVASTATIN CALCIUM 40 MG PO TABS: 40.0000 mg | ORAL_TABLET | Freq: Every day | ORAL | 3 refills | Status: DC

## 2021-04-13 MED ORDER — EZETIMIBE 10 MG PO TABS: 10.0000 mg | ORAL_TABLET | Freq: Every day | ORAL | 3 refills | Status: DC

## 2021-04-16 ENCOUNTER — Other Ambulatory Visit: Payer: Self-pay

## 2021-04-16 ENCOUNTER — Ambulatory Visit (INDEPENDENT_AMBULATORY_CARE_PROVIDER_SITE_OTHER): Payer: Medicare Other | Admitting: Psychiatry

## 2021-04-16 ENCOUNTER — Encounter: Payer: Self-pay | Admitting: Psychiatry

## 2021-04-16 DIAGNOSIS — F5101 Primary insomnia: Secondary | ICD-10-CM | POA: Diagnosis not present

## 2021-04-16 DIAGNOSIS — I25119 Atherosclerotic heart disease of native coronary artery with unspecified angina pectoris: Secondary | ICD-10-CM | POA: Diagnosis not present

## 2021-04-16 DIAGNOSIS — F09 Unspecified mental disorder due to known physiological condition: Secondary | ICD-10-CM | POA: Diagnosis not present

## 2021-04-16 DIAGNOSIS — R4789 Other speech disturbances: Secondary | ICD-10-CM | POA: Diagnosis not present

## 2021-04-16 MED ORDER — QUETIAPINE FUMARATE 25 MG PO TABS: 25.0000 mg | ORAL_TABLET | Freq: Every day | ORAL | 3 refills | Status: DC

## 2021-04-16 NOTE — Progress Notes (Signed)
[name] [id number] 08-20-1950 70 y.o.  Subjective:   Patient ID:  [name] is a 70 y.o. (DOB [birth year]) female.  Chief Complaint:  Chief Complaint  Patient presents with   Follow-up   Primary insomnia    HPI   [name] presents to the office today for follow-up of chronic insomnia and some anxiety.  seen March 2020.  No meds were changed.  Taking quetiapine daily. 80% of the time it is effective.    08/14/20 appt noted: Seroquel does work, but hangover an hour, but worth it for the sleep.  Goes to sleep and stays asleepMay stay up too late and miss the window at times.  Busy.  Not morning person. No other SE. Overall OK.  Still concerned about [name] and his memory and her memory too.  Daughter continues to be a challenge Re: health.  Son [name] is a patient here and CO not going to sleep on time.  He's 38.  Also concerned with [name]'s mental health and both his brother's have had sons who committed suicide.  1 of his B with Alzheimers and she's had chronic worry over getting Alzheimer's.  [name] is good socially and exercises.  He does Bible studies and is life of the party publicly.  But she worries over his memory at home. He has agreed to see a neurologist.   She still worries over her memory and cog function too.  Still complains of chronic worry issues and more worry over word-finding problems.  But she has no functional impairment.  Patient reports stable mood and denies depressed or irritable moods.  Patient denies any recent difficulty with anxiety.  Patient denies difficulty with sleep initiation or maintenance as long as she takes the quetiapine.  Gets 7 hours usually.  Not drowsy daytime unless she sits to read she's tired chronically. Denies appetite disturbance.  Patient reports that energy and motivation have been good.  Patient denies any difficulty with concentration.  Patient denies any suicidal ideation. Goes to gym. 4 years clean from cancer. Plan:  Continue low dose quetiapine 25 mg HS for TR insomnia. Disc NAC off label for cog  04/16/2021 appointment with the following noted: [name]'s B died Alzheimer's. She's concerned about [name]'s memory and judgment but neuropsych testing was unremarkable. She asked that I review the document with her and answer questions. Disc whether or not one can fake neuropsych testing. Usually 7 hours of sleep. No NAC tried bc of afraid of stomach problems, but she agrees to try.  Past Psychiatric Medication Trials: Multiple sleep med failures including temazepam,  clonazepam with sedation,  alprazolam with no response,  trazodone GI side effects,  doxylamine,  Belsomra no response,  quetiapine good response at 25 mg she has hangover Lexapro nausea,   sertraline 25 mg with cognitive side effects,  .    She has been treated treated for anxiety as a way of trying to address the insomnia but that was not an adequate solution.  Prior neuropsychological testing 2004 because the patient had complaints of memory problems was negative for significant cognitive impairment  GD's psych issues.  Review of Systems:  Review of Systems  Cardiovascular:  Negative for chest pain and palpitations.  Musculoskeletal:  Positive for arthralgias. Negative for back pain.  Neurological:  Negative for tremors and weakness.  Psychiatric/Behavioral:  Positive for decreased concentration and sleep disturbance. Negative for agitation, behavioral problems, confusion, dysphoric mood, hallucinations, self-injury and suicidal ideas. The patient is not  nervous/anxious and is not hyperactive.    Medications: I have reviewed the patient's current medications.  Current Outpatient Medications  Medication Sig Dispense Refill   aspirin 81 MG tablet Take 81 mg by mouth at bedtime.      atorvastatin (LIPITOR) 40 MG tablet Take 1 tablet (40 mg total) by mouth daily. 90 tablet 3   cyclobenzaprine (FLEXERIL) 10 MG tablet Take 10 mg by mouth  as needed.     dicyclomine (BENTYL) 10 MG capsule TAKE 1 CAPSULE(10 MG) BY MOUTH TWICE DAILY AS NEEDED FOR SPASMS 180 capsule 0   Digestive Enzymes (MULTI-ENZYME) TABS Take 1 tablet by mouth as needed.     diltiazem 2 % GEL Apply 1 application topically as needed. For rectal spasm 30 g 1   ezetimibe (ZETIA) 10 MG tablet Take 1 tablet (10 mg total) by mouth daily. 90 tablet 3   hyoscyamine (LEVSIN SL) 0.125 MG SL tablet Take 1 tablet (0.125 mg total) by mouth daily as needed. Please schedule office visit. 90 tablet 3   ibuprofen (ADVIL,MOTRIN) 200 MG tablet Take 600 mg by mouth every 6 (six) hours as needed for headache or moderate pain.     linaclotide (LINZESS) 290 MCG CAPS capsule Take 1 capsule (290 mcg total) by mouth daily before breakfast. 90 capsule 3   nebivolol (BYSTOLIC) 2.5 MG tablet TAKE 1 TABLET EVERY DAY 90 tablet 3   NEXIUM 40 MG capsule Take 1 capsule (40 mg total) by mouth 2 (two) times daily before a meal. BRAND NAME ONLY 180 capsule 3   nitroGLYCERIN (NITROSTAT) 0.4 MG SL tablet place 1 tablet under the tongue if needed every 5 minutes for chest pain for 3 doses IF NO RELIEF AFTER 3RD DOSE CALL PRESCRIBER OR 911. 75 tablet 1   oxyCODONE (OXY IR/ROXICODONE) 5 MG immediate release tablet Take 5 mg by mouth as needed for moderate pain.     Peppermint Oil 90 MG CPCR Take 2 capsules by mouth as needed.     Probiotic Product (PROBIOTIC ACIDOPHILUS) CAPS Take 1 capsule by mouth daily.     ranolazine (RANEXA) 500 MG 12 hr tablet TAKE 1 TABLET TWICE DAILY 180 tablet 3   traMADol (ULTRAM) 50 MG tablet Take 50 mg by mouth every 6 (six) hours as needed.     Vitamin D-Vitamin K (K2 PLUS D3) [phone] MCG-UNIT TABS 1 tablet     Difluprednate 0.05 % EMUL Place 1 drop into the left eye 4 (four) times daily. (Patient not taking: Reported on 04/16/2021)     QUEtiapine (SEROQUEL) 25 MG tablet Take 1 tablet (25 mg total) by mouth at bedtime. 90 tablet 3   No current facility-administered medications  for this visit.    Medication Side Effects:  A little hard to get out of the bed  Allergies:  Allergies  Allergen Reactions   Clindamycin/Lincomycin Other (See Comments)    Pt not sure of reaction    Doxycycline Nausea Only and Nausea And Vomiting   Epinephrine    Escitalopram Oxalate Nausea Only   Macrodantin Other (See Comments)    Flushed, hot   Sulfa Antibiotics    Trazodone Nausea Only   Trazodone And Nefazodone Nausea Only   Trimethoprim Other (See Comments)    unknown    Lincomycin Rash    Reaction unknown   Nitrofurantoin Rash    Flushed, hot   Nitrofurantoin Macrocrystal Anxiety    Flushing, hot face and ears    Past Medical History:  Diagnosis  Date   Anxiety    Psychiatry Dr. Stan Head, concerns for memory loss, anxiety   Bladder tumor    Broken heart syndrome    Cancer ([place])    bladder CA   Chronic chest pain    Coronary atherosclerosis CARDIOLOGIST-  DR Daneen Schick   Cath 11/2006 demonstrating moderate obstructive coronary disease with 70-80% septal perforator #1, 50-70% first diagonal, 80% third diagonal, 50% mid and distal LAD & heavy calcification throughout all 3 coronaries. LVF normal.   Esophageal motility disorder    Essential hypertension, benign    Fatty liver 04/28/12   GERD (gastroesophageal reflux disease)    H/O esophageal spasm    Heart murmur    History of esophageal spasm    takes ranexa   History of TIA (transient ischemic attack)    2011--  no residuals   Hyperlipidemia    IBS (irritable bowel syndrome)    Diarrhea predominent, esophageal spasm severe, GERD - Dr. Elicia Lamp   Interstitial cystitis    Dr. Gaynelle Arabian   Memory loss    Psychiatry Dr. Stan Head, concerns for memory loss, anxiety   PONV (postoperative nausea and vomiting)    AND URINARY RETENTION   Sensation of pressure in bladder area    TMJ (temporomandibular joint syndrome)    Wears glasses     Family History  Problem Relation Age of Onset   Heart  disease Mother    Colon polyps Mother    Alzheimer's disease Mother    Dementia Mother    CAD Mother    Heart disease Father    Diverticulosis Father    CAD Father        in his 49s   Heart disease Brother    Osteoarthritis Other        Multiple family members   Obesity Other        Multiple family members   Depression Other        Multiple family members   Breast cancer Sister 37       Pam   Colon cancer Neg Hx    Esophageal cancer Neg Hx    Stomach cancer Neg Hx    Rectal cancer Neg Hx     Social History   Socioeconomic History   Marital status: Married    Spouse name: Not on file   Number of children: 2   Years of education: Not on file   Highest education level: Not on file  Occupational History   Occupation: caregiver  Tobacco Use   Smoking status: Former    Years: 10.00    Types: Cigarettes    Quit date: 06/10/1973    Years since quitting: 47.8   Smokeless tobacco: Never  Vaping Use   Vaping Use: Never used  Substance and Sexual Activity   Alcohol use: Yes    Alcohol/week: 2.0 standard drinks    Types: 2 Glasses of wine per week    Comment: social   Drug use: No   Sexual activity: Yes  Other Topics Concern   Not on file  Social History Narrative   Not on file   Social Determinants of Health   Financial Resource Strain: Not on file  Food Insecurity: Not on file  Transportation Needs: Not on file  Physical Activity: Not on file  Stress: Not on file  Social Connections: Not on file  Intimate Partner Violence: Not on file    Past Medical History, Surgical history, Social history, and Family  history were reviewed and updated as appropriate.   Please see review of systems for further details on the patient's review from today.   Objective:   Physical Exam:  There were no vitals taken for this visit.  Physical Exam Constitutional:      General: She is not in acute distress.    Appearance: She is well-developed.  Musculoskeletal:         General: No deformity.  Neurological:     Mental Status: She is alert and oriented to person, place, and time.     Motor: No tremor.     Coordination: Coordination normal.     Gait: Gait normal.  Psychiatric:        Attention and Perception: Attention normal. She is attentive.        Mood and Affect: Mood normal. Mood is not anxious or depressed. Affect is not labile, blunt, angry or inappropriate.        Speech: Speech normal. Speech is not rapid and pressured.        Behavior: Behavior normal.        Thought Content: Thought content normal. Thought content does not include homicidal or suicidal ideation. Thought content does not include homicidal or suicidal plan.        Cognition and Memory: Cognition normal. She exhibits impaired recent memory.        Judgment: Judgment normal.     Comments: Insight is good.    Lab Review:     Component Value Date/Time   NA 136 12/24/2019 1505   NA 140 11/03/2018 1029   K 4.3 12/24/2019 1505   CL 99 12/24/2019 1505   CO2 31 12/24/2019 1505   GLUCOSE 93 12/24/2019 1505   BUN 13 12/24/2019 1505   BUN 11 11/03/2018 1029   CREATININE 0.90 02/09/2020 1753   CREATININE 0.80 01/12/2018 1031   CALCIUM 9.2 12/24/2019 1505   PROT 6.9 12/24/2019 1505   PROT 6.4 11/03/2018 1029   ALBUMIN 4.3 12/24/2019 1505   ALBUMIN 4.4 11/03/2018 1029   AST 17 12/24/2019 1505   ALT 17 12/24/2019 1505   ALKPHOS 83 12/24/2019 1505   BILITOT 0.3 12/24/2019 1505   BILITOT 0.2 11/03/2018 1029   GFRNONAA 74 11/03/2018 1029   GFRNONAA 77 01/12/2018 1031   GFRAA 86 11/03/2018 1029   GFRAA 89 01/12/2018 1031       Component Value Date/Time   WBC 7.3 12/24/2019 1505   RBC 4.16 12/24/2019 1505   HGB 11.9 (L) 12/24/2019 1505   HGB 12.1 11/06/2016 1545   HCT 35.7 (L) 12/24/2019 1505   HCT 37.8 11/06/2016 1545   PLT 267.0 12/24/2019 1505   PLT 281 11/06/2016 1545   MCV 85.8 12/24/2019 1505   MCV 89 11/06/2016 1545   MCH 28.9 01/12/2018 1031   MCHC 33.5  12/24/2019 1505   RDW 15.1 12/24/2019 1505   RDW 14.8 11/06/2016 1545   LYMPHSABS 1.5 12/24/2019 1505   MONOABS 0.6 12/24/2019 1505   EOSABS 0.1 12/24/2019 1505   BASOSABS 0.0 12/24/2019 1505    No results found for: POCLITH, LITHIUM   No results found for: PHENYTOIN, PHENOBARB, VALPROATE, CBMZ   .res Assessment: Plan:    Primary insomnia - Plan: QUEtiapine (SEROQUEL) 25 MG tablet  Cognitive dysfunction  Word finding difficulty  Word-finding problems  Supportive therapy on dealing with family situations and worries that [name] will get Alzheimer's also and disagreement with H on variety of subjects and he's been irritable with her.  Disc using faith to strengthen herself with stressors.  Disc chronic worry over memory issues with normal neuropsych testing.  Normalized some cognitive decline with age.  [name]'s brother has dementia.    Disc risk of sleep meds and risk of not taking them and having poor sleep.  Disc sleep hygiene.  Re: quetiapine low dosage off label for sleep bc of TR insomnia and multiple other med failures.   It has been successful generally.  Discussed potential metabolic side effects associated with atypical antipsychotics, as well as potential risk for movement side effects. Advised pt to contact office if movement side effects occur.   Overall sleep managed but cannot with quetiapine  Continue low dose quetiapine 25 mg HS for TR insomnia.  Disc the off-label use of N-Acetylcysteine at 600 mg daily to help with mild cognitive problems.  It can be combined with a B-complex vitamin as the B-12 and folate have been shown to sometimes enhance the effect.  Supportive therapy dealing with concerns about H's judgment and memory issues.  He had normal neuropsych testing and does not currently have Alzheimer's dz.  Helped her process this.  This appt was 30 mins.  FU 1 year  Lynder Parents, MD, DFAPA  Please see After Visit Summary for patient specific  instructions.  Future Appointments  Date Time Provider [place]  04/20/2021  8:10 AM Mauri Pole, MD [facility]  04/21/2021  6:20 PM GI[facility] MR 1 [id number]    No orders of the defined types were placed in this encounter.     -------------------------------

## 2021-04-19 ENCOUNTER — Ambulatory Visit: Payer: Medicare Other | Admitting: Interventional Cardiology

## 2021-04-20 ENCOUNTER — Encounter: Payer: Self-pay | Admitting: Gastroenterology

## 2021-04-20 ENCOUNTER — Ambulatory Visit (INDEPENDENT_AMBULATORY_CARE_PROVIDER_SITE_OTHER): Payer: Medicare Other | Admitting: Gastroenterology

## 2021-04-20 VITALS — BP 110/60 | HR 65 | Ht 62.5 in | Wt 124.4 lb

## 2021-04-20 DIAGNOSIS — K581 Irritable bowel syndrome with constipation: Secondary | ICD-10-CM | POA: Diagnosis not present

## 2021-04-20 DIAGNOSIS — R1013 Epigastric pain: Secondary | ICD-10-CM | POA: Diagnosis not present

## 2021-04-20 DIAGNOSIS — K219 Gastro-esophageal reflux disease without esophagitis: Secondary | ICD-10-CM

## 2021-04-20 DIAGNOSIS — I25119 Atherosclerotic heart disease of native coronary artery with unspecified angina pectoris: Secondary | ICD-10-CM | POA: Diagnosis not present

## 2021-04-20 MED ORDER — NEXIUM 40 MG PO CPDR: 40.0000 mg | DELAYED_RELEASE_CAPSULE | Freq: Every day | ORAL | 3 refills | Status: DC

## 2021-04-20 MED ORDER — SUCRALFATE 1 G PO TABS: 1.0000 g | ORAL_TABLET | Freq: Three times a day (TID) | ORAL | 3 refills | Status: DC

## 2021-04-20 MED ORDER — DICYCLOMINE HCL 10 MG PO CAPS
ORAL_CAPSULE | ORAL | 0 refills | Status: DC
Start: 2021-04-20 — End: 2022-03-25

## 2021-04-20 MED ORDER — FAMOTIDINE 20 MG PO TABS: 20.0000 mg | ORAL_TABLET | Freq: Every day | ORAL | 3 refills | Status: DC

## 2021-04-20 MED ORDER — LINACLOTIDE 145 MCG PO CAPS: 145.0000 ug | ORAL_CAPSULE | Freq: Every day | ORAL | 3 refills | Status: DC

## 2021-04-20 NOTE — Patient Instructions (Addendum)
We have sent the following prescriptions to your pharmacy: [facility], Pepcid,Dicyclomine,Carafate, Linzess 145 mcg   Follow up in 3 months   If you are age 69 or older, your body mass index should be between 23-30. Your Body mass index is 22.39 kg/m. If  this is out of the aforementioned range listed, please consider follow up with your Primary Care Provider.  If you are age 95 or younger, your body mass index should be between 19-25. Your Body mass index is 22.39 kg/m. If this is out of the aformentioned range listed, please consider follow up with your Primary Care Provider.   ________________________________________________________  The [facility] providers would like to encourage you to use [facility] to communicate with providers for non-urgent requests or questions.  Due to long hold times on the telephone, sending your provider a message by [facility] may be a faster and more efficient way to get a response.  Please allow 48 business hours for a response.  Please remember that this is for non-urgent requests.  _______________________________________________________   I appreciate the  opportunity to care for you  Thank You   Harl Bowie , MD

## 2021-04-20 NOTE — Progress Notes (Signed)
[name]    [id number]    Aug 31, 1950  Primary Care Physician:Miller, Bennetta La[place], [place]  Referring Physician: Scheryl Marten, [place],  [place] [address]   Chief complaint:  IBS  HPI:  70 year old very pleasant female here for follow-up visit for GERD and IBS  She received a letter from her insurance that Nexium is no longer preferred and she would have high out-of-pocket expense she has tried other PPI, omeprazole, pantoprazole including over-the-counter Nexium.  No improvement of symptoms whenever she tries to use any other acid reducing medicine other than Nexium  EGD April 28, 2019: Normal esophagus.  Regular Z-line.  Gastritis biopsies negative for H. Pylori.   She continues to struggle with her bowel habits and IBS symptoms.  She is using Linzess as needed  TTG IgA antibody negative for celiac   Colonoscopy April 07, 2013 showed internal hemorrhoids otherwise normal exam   Flexible sigmoidoscopy January 22, 2016 for persistent anorectal/pelvic pain was unremarkable other than internal hemorrhoids   Abdominal ultrasound January 27, 2020: Unremarkable   CT abdomen with contrast February 09, 2020: Large colonic stool burden otherwise no acute findings.  Additional chronic findings were noted in the report including emphysema, osteopenia and atherosclerosis   Outpatient Encounter Medications as of 04/20/2021  Medication Sig   aspirin 81 MG tablet Take 81 mg by mouth at bedtime.    atorvastatin (LIPITOR) 40 MG tablet Take 1 tablet (40 mg total) by mouth daily.   cyclobenzaprine (FLEXERIL) 10 MG tablet Take 10 mg by mouth as needed.   dicyclomine (BENTYL) 10 MG capsule TAKE 1 CAPSULE(10 MG) BY MOUTH TWICE DAILY AS NEEDED FOR SPASMS   Digestive Enzymes (MULTI-ENZYME) TABS Take 1 tablet by mouth as needed.   diltiazem 2 % GEL Apply 1 application topically as needed. For rectal spasm   ezetimibe (ZETIA) 10 MG tablet Take 1  tablet (10 mg total) by mouth daily.   hyoscyamine (LEVSIN SL) 0.125 MG SL tablet Take 1 tablet (0.125 mg total) by mouth daily as needed. Please schedule office visit.   ibuprofen (ADVIL,MOTRIN) 200 MG tablet Take 600 mg by mouth every 6 (six) hours as needed for headache or moderate pain.   linaclotide (LINZESS) 290 MCG CAPS capsule Take 1 capsule (290 mcg total) by mouth daily before breakfast.   nebivolol (BYSTOLIC) 2.5 MG tablet TAKE 1 TABLET EVERY DAY   NEXIUM 40 MG capsule Take 1 capsule (40 mg total) by mouth 2 (two) times daily before a meal. BRAND NAME ONLY   nitroGLYCERIN (NITROSTAT) 0.4 MG SL tablet place 1 tablet under the tongue if needed every 5 minutes for chest pain for 3 doses IF NO RELIEF AFTER 3RD DOSE CALL PRESCRIBER OR 911.   oxyCODONE (OXY IR/ROXICODONE) 5 MG immediate release tablet Take 5 mg by mouth as needed for moderate pain.   Peppermint Oil 90 MG CPCR Take 2 capsules by mouth as needed.   Probiotic Product (PROBIOTIC ACIDOPHILUS) CAPS Take 1 capsule by mouth daily.   QUEtiapine (SEROQUEL) 25 MG tablet Take 1 tablet (25 mg total) by mouth at bedtime.   ranolazine (RANEXA) 500 MG 12 hr tablet TAKE 1 TABLET TWICE DAILY   traMADol (ULTRAM) 50 MG tablet Take 50 mg by mouth every 6 (six) hours as needed.   Vitamin D-Vitamin K (K2 PLUS D3) [phone] MCG-UNIT TABS 1 tablet   [DISCONTINUED] Difluprednate 0.05 % EMUL Place 1  drop into the left eye 4 (four) times daily. (Patient not taking: Reported on 04/16/2021)   No facility-administered encounter medications on file as of 04/20/2021.    Allergies as of 04/20/2021 - Review Complete 04/20/2021  Allergen Reaction Noted   Clindamycin/lincomycin Other (See Comments) 10/05/2011   Doxycycline Nausea Only and Nausea And Vomiting 10/05/2011   Epinephrine  08/23/2019   Escitalopram oxalate Nausea Only 10/05/2011   Macrodantin Other (See Comments) 10/05/2011   Sulfa antibiotics  07/20/2020   Trazodone Nausea Only 10/05/2011    Trazodone and nefazodone Nausea Only 10/05/2011   Trimethoprim Other (See Comments) 12/09/2013   Lincomycin Rash 10/05/2011   Nitrofurantoin Rash 10/05/2011   Nitrofurantoin macrocrystal Anxiety     Past Medical History:  Diagnosis Date   Anxiety    Psychiatry Dr. Stan Head, concerns for memory loss, anxiety   Bladder tumor    Broken heart syndrome    Cancer ([facility])    bladder CA   Chronic chest pain    Coronary atherosclerosis CARDIOLOGIST-  DR Daneen Schick   Cath 11/2006 demonstrating moderate obstructive coronary disease with 70-80% septal perforator #1, 50-70% first diagonal, 80% third diagonal, 50% mid and distal LAD & heavy calcification throughout all 3 coronaries. LVF normal.   Esophageal motility disorder    Essential hypertension, benign    Fatty liver 04/28/12   GERD (gastroesophageal reflux disease)    H/O esophageal spasm    Heart murmur    History of esophageal spasm    takes ranexa   History of TIA (transient ischemic attack)    2011--  no residuals   Hyperlipidemia    IBS (irritable bowel syndrome)    Diarrhea predominent, esophageal spasm severe, GERD - Dr. Elicia Lamp   Interstitial cystitis    Dr. Gaynelle Arabian   Memory loss    Psychiatry Dr. Stan Head, concerns for memory loss, anxiety   PONV (postoperative nausea and vomiting)    AND URINARY RETENTION   Sensation of pressure in bladder area    TMJ (temporomandibular joint syndrome)    Wears glasses     Past Surgical History:  Procedure Laterality Date   ABDOMINAL HYSTERECTOMY  AGE 9 -- [place]   AND OVARIAN CYSTECTOMY   BENIGN EXCISIONAL BREAST BX  1996   BREAST EXCISIONAL BIOPSY Right [place]  11-28-2006   DR Mallie Mussel SMITH    moderate cad/  70-80% septal perforator #1, 50-70% first diagonal, 80% third diagonal, 50% mid and distal LAD & heavy calcification throughout all 3 coronaries. LVF normal.   CARDIAC CATHETERIZATION  03-26-2012   DR Daneen Schick    patent CFX,  RCA,  & pLAD/  mLAD >50% to <70% a focal eccentric region that overlaps the third diagonal/  90% diagonal ostial and unchanged from prior study /   ef 65%   CARPAL TUNNEL RELEASE Bilateral    CYSTO WITH HYDRODISTENSION N/A 01/14/2014   Procedure: CYSTOSCOPY/HYDRODISTENSION/INSERTION OF MARCAINE AND PYRIDUIM INTO BLADDER/INJECTION OF MARCAINE AND KENALOG into sub trigone space ;  Surgeon: Ailene Rud, MD;  Location: [facility];  Service: Urology;  Laterality: N/A;   CYSTO/  HYDRODISTENTION/  INSTILLATION THERAPY  03-12-2010   ESOPHAGOGASTRODUODENOSCOPY  04/30/2012   Procedure: ESOPHAGOGASTRODUODENOSCOPY (EGD);  Surgeon: Lafayette Dragon, MD;  Location: [facility];  Service: Endoscopy;  Laterality: N/A;   LEFT HEART CATH AND CORONARY ANGIOGRAPHY N/A 02/18/2017   Procedure: LEFT HEART CATH AND CORONARY ANGIOGRAPHY;  Surgeon: Tamala Julian,  Lynnell Dike, MD;  Location: [facility];  Service: Cardiovascular;  Laterality: N/A;   LUMBAR [place]   NEGATIVE SLEEP STUDY  2010   SHOULDER ARTHROSCOPY Left 12-23-2011   SHOULDER SURGERY Right    TRANSTHORACIC ECHOCARDIOGRAM  04-21-2012   GRADE I DIASTOLIC DYSFUNCTION/  EF 60-65%/  MILD MR  &  TR   TRANSURETHRAL RESECTION OF BLADDER TUMOR Right 01/03/2015   Procedure: TRANSURETHRAL RESECTION OF BLADDER TUMOR (TURBT);  Surgeon: Carolan Clines, MD;  Location: [facility];  Service: Urology;  Laterality: Right;    Family History  Problem Relation Age of Onset   Heart disease Mother    Colon polyps Mother    Alzheimer's disease Mother    Dementia Mother    CAD Mother    Heart disease Father    Diverticulosis Father    CAD Father        in his 40s   Heart disease Brother    Osteoarthritis Other        Multiple family members   Obesity Other        Multiple family members   Depression Other        Multiple family members   Breast cancer Sister 49       Pam   Colon cancer Neg Hx    Esophageal  cancer Neg Hx    Stomach cancer Neg Hx    Rectal cancer Neg Hx     Social History   Socioeconomic History   Marital status: Married    Spouse name: Not on file   Number of children: 2   Years of education: Not on file   Highest education level: Not on file  Occupational History   Occupation: caregiver  Tobacco Use   Smoking status: Former    Years: 10.00    Types: Cigarettes    Quit date: 06/10/1973    Years since quitting: 47.8   Smokeless tobacco: Never  Vaping Use   Vaping Use: Never used  Substance and Sexual Activity   Alcohol use: Yes    Alcohol/week: 2.0 standard drinks    Types: 2 Glasses of wine per week    Comment: social   Drug use: No   Sexual activity: Yes  Other Topics Concern   Not on file  Social History Narrative   Not on file   Social Determinants of Health   Financial Resource Strain: Not on file  Food Insecurity: Not on file  Transportation Needs: Not on file  Physical Activity: Not on file  Stress: Not on file  Social Connections: Not on file  Intimate Partner Violence: Not on file      Review of systems: All other review of systems negative except as mentioned in the HPI.   Physical Exam: Vitals:   04/20/21 0816  BP: 110/60  Pulse: 65   Body mass index is 22.39 kg/m. Gen:      No acute distress Neuro: alert and oriented x 3 Psych: normal mood and affect  Data Reviewed:  Reviewed labs, radiology imaging, old records and pertinent past GI work up   Assessment and Plan/Recommendations:  70 year old very pleasant female with history of esophageal spasms, GERD, bladder cancer 2016, CAD s/p TIA and IBS constipation  GERD: Continue Nexium and antireflux measures   IBS constipation: Continue with increased water intake dietary fiber.   Continued Linzess 290 mcg daily as needed  Return in 3 months  This visit required 30 minutes  of patient care (this includes precharting, chart review, review of results, face-to-face time  used for counseling as well as treatment plan and follow-up. The patient was provided an opportunity to ask questions and all were answered. The patient agreed with the plan and demonstrated an understanding of the instructions.  Damaris Hippo , MD    CC: Scheryl Marten, [place]

## 2021-04-21 ENCOUNTER — Other Ambulatory Visit: Payer: Self-pay

## 2021-04-21 ENCOUNTER — Ambulatory Visit
Admission: RE | Admit: 2021-04-21 | Discharge: 2021-04-21 | Disposition: A | Discharge status: Home / Self Care | Payer: Medicare Other | Source: Ambulatory Visit | Attending: Student | Admitting: Student

## 2021-04-21 DIAGNOSIS — M25562 Pain in left knee: Secondary | ICD-10-CM

## 2021-05-07 DIAGNOSIS — S83242A Other tear of medial meniscus, current injury, left knee, initial encounter: Secondary | ICD-10-CM | POA: Diagnosis not present

## 2021-05-09 ENCOUNTER — Telehealth: Payer: Self-pay | Admitting: *Deleted

## 2021-05-09 NOTE — Telephone Encounter (Signed)
   Pre-operative Risk Assessment    Patient Name: [name]  DOB: 11/05/1950 MRN: [id number]      Request for Surgical Clearance   Procedure:   LEFT KNEE SCOPE PARTIAL MEDIAL MENISECTOMY, CHONDROPLASTY  Date of Surgery: Clearance 07/05/21                                 Surgeon:  DR. MATTHEW OLIN Surgeon's Group or Practice Name:  [facility] Phone number:  [phone] Fax number:  [phone] ATTN: Orson Slick   Type of Clearance Requested: - Medical  - Pharmacy:  Hold Aspirin x 5-7 DAYS PRIOR TO SURGERY   Type of Anesthesia:   CHOICE   Additional requests/questions:   Jiles Prows   05/09/2021, 9:47 AM

## 2021-05-10 ENCOUNTER — Encounter: Payer: Self-pay | Admitting: Gastroenterology

## 2021-05-10 NOTE — Telephone Encounter (Signed)
Hi Dr. Tamala Julian,  [name] is scheduled for a left knee scope on 07/05/2021 and is being asked to hold Aspirin. She has a history of severe diffuse CAD noted on last cardiac catheterization in 02/2017. You last saw her in 02/2021 at which time she reported intermittent palpitations over the last 3 weeks as well as some exertional fatigue. Monitor was ordered and showed underlying sinus rhythm with occasional PACs/PVCs. Can patient hold Aspirin for 5-7 days prior to procedure?  Please route response back to P CV DIV PREOP.  Thank you! Keegan Bensch

## 2021-05-11 NOTE — Telephone Encounter (Signed)
   Primary Cardiologist: Sinclair Grooms, MD  Chart reviewed as part of pre-operative protocol coverage. Given past medical history and time since last visit, based on ACC/AHA guidelines, [name] would be at acceptable risk for the planned procedure without further cardiovascular testing.   Her RCRI is a class III risk, 6.6% risk of major cardiac event.  Her aspirin may be held for 5 just 7 days prior to her procedure.  Please resume as soon as hemostasis is achieved.  I will route this recommendation to the requesting party via Epic fax function and remove from pre-op pool.  Please call with questions.  Jossie Ng. Kameah Rawl NP-C    05/11/2021, 11:53 AM [place] [address] Office [phone] Fax [phone]

## 2021-05-16 ENCOUNTER — Telehealth: Payer: Self-pay

## 2021-05-16 NOTE — Telephone Encounter (Signed)
Patient has sent a patient advise request. She is reporting reflux issues despite daily Nexium. She reports she does take Famotidine and does not feel this is helping. I inquired about her constipation. She takes Linzess PRN if she has not had a bowel movement in 3 days. Then she has a "blow-out." Do you have any suggestions for her to try?

## 2021-05-21 NOTE — Telephone Encounter (Signed)
Please send prescription for Magic mouthwash with lidocaine 5 cc every 6 hours as needed to use for breakthrough heartburn and severe reflux symptoms.  Unfortunately with insurance changes she has high out-of-pocket expense with Nexium.  Thank you

## 2021-05-22 NOTE — Telephone Encounter (Signed)
Called and spoke with patient at length in regards to recommendations. Pt has several questions on how she is supposed to be taking the Nexium and Pepcid. Pt states that the Pepcid was added to help wean her off of Nexium. Pt has breakthrough symptoms when she does not take the Nexium daily. Pt states that she was told to take Nexium every other day and alternate that with Pepcid at bedtime. Pt does not seem to have clear instructions on how she is to take medications or if she is supposed to be weaning off. Advised that the prescriptions that were sent in were for her to take daily. Pt needs clarification. She is very concerned and would like to discuss further with you. Pt states that she has been sending my chart messages since 12/1, seeking advice before the holidays. Will hold on sending RX for magic mouthwash at this time.Thanks

## 2021-05-22 NOTE — Telephone Encounter (Signed)
Spoke with pt and she is aware of Dr. Woodward Ku recommendation, not happy but aware.

## 2021-05-22 NOTE — Telephone Encounter (Signed)
If she is experiencing increased breakthrough heartburn when she does not take Nexium, she will need to go back to taking Nexium daily and use Pepcid in the evening only as needed if she has any persistent breakthrough heartburn.  I advised patient to try if she can wean Nexium off because she was worried about potential increased expense given her insurance is not going to cover it any longer.  Thank you

## 2021-05-24 DIAGNOSIS — F411 Generalized anxiety disorder: Secondary | ICD-10-CM | POA: Diagnosis not present

## 2021-05-24 DIAGNOSIS — K580 Irritable bowel syndrome with diarrhea: Secondary | ICD-10-CM | POA: Diagnosis not present

## 2021-05-24 DIAGNOSIS — I5181 Takotsubo syndrome: Secondary | ICD-10-CM | POA: Diagnosis not present

## 2021-05-24 DIAGNOSIS — K219 Gastro-esophageal reflux disease without esophagitis: Secondary | ICD-10-CM | POA: Diagnosis not present

## 2021-05-24 DIAGNOSIS — Z0000 Encounter for general adult medical examination without abnormal findings: Secondary | ICD-10-CM | POA: Diagnosis not present

## 2021-05-24 DIAGNOSIS — I2510 Atherosclerotic heart disease of native coronary artery without angina pectoris: Secondary | ICD-10-CM | POA: Diagnosis not present

## 2021-05-24 DIAGNOSIS — Z1389 Encounter for screening for other disorder: Secondary | ICD-10-CM | POA: Diagnosis not present

## 2021-05-24 DIAGNOSIS — N952 Postmenopausal atrophic vaginitis: Secondary | ICD-10-CM | POA: Diagnosis not present

## 2021-05-24 DIAGNOSIS — M797 Fibromyalgia: Secondary | ICD-10-CM | POA: Diagnosis not present

## 2021-05-24 DIAGNOSIS — G4700 Insomnia, unspecified: Secondary | ICD-10-CM | POA: Diagnosis not present

## 2021-05-24 DIAGNOSIS — A6000 Herpesviral infection of urogenital system, unspecified: Secondary | ICD-10-CM | POA: Diagnosis not present

## 2021-05-24 DIAGNOSIS — E785 Hyperlipidemia, unspecified: Secondary | ICD-10-CM | POA: Diagnosis not present

## 2021-05-24 DIAGNOSIS — N951 Menopausal and female climacteric states: Secondary | ICD-10-CM | POA: Diagnosis not present

## 2021-05-24 DIAGNOSIS — C689 Malignant neoplasm of urinary organ, unspecified: Secondary | ICD-10-CM | POA: Diagnosis not present

## 2021-06-18 DIAGNOSIS — H02423 Myogenic ptosis of bilateral eyelids: Secondary | ICD-10-CM | POA: Diagnosis not present

## 2021-06-18 DIAGNOSIS — H02831 Dermatochalasis of right upper eyelid: Secondary | ICD-10-CM | POA: Diagnosis not present

## 2021-06-18 DIAGNOSIS — H0279 Other degenerative disorders of eyelid and periocular area: Secondary | ICD-10-CM | POA: Diagnosis not present

## 2021-06-18 DIAGNOSIS — H02411 Mechanical ptosis of right eyelid: Secondary | ICD-10-CM | POA: Diagnosis not present

## 2021-06-18 DIAGNOSIS — H57813 Brow ptosis, bilateral: Secondary | ICD-10-CM | POA: Diagnosis not present

## 2021-06-18 DIAGNOSIS — H02834 Dermatochalasis of left upper eyelid: Secondary | ICD-10-CM | POA: Diagnosis not present

## 2021-06-18 DIAGNOSIS — H18513 Endothelial corneal dystrophy, bilateral: Secondary | ICD-10-CM | POA: Diagnosis not present

## 2021-06-18 DIAGNOSIS — H02413 Mechanical ptosis of bilateral eyelids: Secondary | ICD-10-CM | POA: Diagnosis not present

## 2021-06-18 DIAGNOSIS — H02412 Mechanical ptosis of left eyelid: Secondary | ICD-10-CM | POA: Diagnosis not present

## 2021-06-18 DIAGNOSIS — H53483 Generalized contraction of visual field, bilateral: Secondary | ICD-10-CM | POA: Diagnosis not present

## 2021-06-21 ENCOUNTER — Encounter: Payer: Self-pay | Admitting: Interventional Cardiology

## 2021-06-26 DIAGNOSIS — H53483 Generalized contraction of visual field, bilateral: Secondary | ICD-10-CM | POA: Diagnosis not present

## 2021-06-28 DIAGNOSIS — L578 Other skin changes due to chronic exposure to nonionizing radiation: Secondary | ICD-10-CM | POA: Diagnosis not present

## 2021-06-28 DIAGNOSIS — Z86018 Personal history of other benign neoplasm: Secondary | ICD-10-CM | POA: Diagnosis not present

## 2021-06-28 DIAGNOSIS — L821 Other seborrheic keratosis: Secondary | ICD-10-CM | POA: Diagnosis not present

## 2021-06-28 DIAGNOSIS — Z85828 Personal history of other malignant neoplasm of skin: Secondary | ICD-10-CM | POA: Diagnosis not present

## 2021-06-28 DIAGNOSIS — D485 Neoplasm of uncertain behavior of skin: Secondary | ICD-10-CM | POA: Diagnosis not present

## 2021-06-28 DIAGNOSIS — D2262 Melanocytic nevi of left upper limb, including shoulder: Secondary | ICD-10-CM | POA: Diagnosis not present

## 2021-06-28 DIAGNOSIS — D2271 Melanocytic nevi of right lower limb, including hip: Secondary | ICD-10-CM | POA: Diagnosis not present

## 2021-06-28 DIAGNOSIS — L304 Erythema intertrigo: Secondary | ICD-10-CM | POA: Diagnosis not present

## 2021-06-28 DIAGNOSIS — L818 Other specified disorders of pigmentation: Secondary | ICD-10-CM | POA: Diagnosis not present

## 2021-06-28 DIAGNOSIS — D225 Melanocytic nevi of trunk: Secondary | ICD-10-CM | POA: Diagnosis not present

## 2021-06-28 DIAGNOSIS — L814 Other melanin hyperpigmentation: Secondary | ICD-10-CM | POA: Diagnosis not present

## 2021-07-05 NOTE — Progress Notes (Signed)
Sent message, via epic in basket, requesting orders in epic from surgeon.  

## 2021-07-06 NOTE — Patient Instructions (Addendum)
DUE TO COVID-19 ONLY ONE VISITOR IS ALLOWED TO COME WITH YOU AND STAY IN THE WAITING ROOM ONLY DURING PRE OP AND PROCEDURE DAY OF SURGERY IF YOU ARE GOING HOME AFTER SURGERY. IF YOU ARE SPENDING THE NIGHT 2 PEOPLE MAY VISIT WITH YOU IN YOUR PRIVATE ROOM AFTER SURGERY UNTIL VISITING  HOURS ARE OVER AT 800 PM AND 1  VISITOR  MAY  SPEND THE NIGHT.                 [name]     Your procedure is scheduled on: 07/12/21   Report to [facility] Main  Entrance   Report to admitting at   10:45 AM     Call this number if you have problems the morning of surgery [phone]    No food after midnight.    You may have clear liquid until 10:00 AM.    At 10:15 AM drink pre surgery drink.   Nothing by mouth after 10:00 AM.   CLEAR LIQUID DIET   Foods Allowed                                                                     Foods Excluded  Coffee and tea, regular and decaf                             liquids that you cannot  Plain Jell-O any favor except red or purple                                           see through such as: Fruit ices (not with fruit pulp)                                     milk, soups, orange juice  Iced Popsicles                                    All solid food Carbonated beverages, regular and diet                                    Cranberry, grape and apple juices Sports drinks like Gatorade Lightly seasoned clear broth or consume(fat free) Sugar    BRUSH YOUR TEETH MORNING OF SURGERY AND RINSE YOUR MOUTH OUT, NO CHEWING GUM CANDY OR MINTS.     Take these medicines the morning of surgery with A SIP OF WATER: Ranolazine, Nebivolol, Quetiapine, Nexium  Stop taking ___________on __________as instructed by _____________.  Stop taking ____________as directed by your Surgeon/Cardiologist.  Contact your Surgeon/Cardiologist for instructions on Anticoagulant Therapy prior to surgery.                                  You may not have any metal  on your body  including hair pins and              piercings  Do not wear jewelry, make-up, lotions, powders or perfumes, deodorant             Do not wear nail polish on your fingernails.  Do not shave  48 hours prior to surgery.              Do not bring valuables to the hospital. [place].  Contacts, dentures or bridgework may not be worn into surgery.     Patients discharged the day of surgery will not be allowed to drive home.  IF YOU ARE HAVING SURGERY AND GOING HOME THE SAME DAY, YOU MUST HAVE AN ADULT TO DRIVE YOU HOME AND BE WITH YOU FOR 24 HOURS. YOU MAY GO HOME BY TAXI OR UBER OR ORTHERWISE, BUT AN ADULT MUST ACCOMPANY YOU HOME AND STAY WITH YOU FOR 24 HOURS.  Name and phone number of your driver:  Special Instructions: N/A              Please read over the following fact sheets you were given: _____________________________________________________________________             [facility] - Preparing for Surgery Before surgery, you can play an important role.  Because skin is not sterile, your skin needs to be as free of germs as possible.  You can reduce the number of germs on your skin by washing with CHG (chlorahexidine gluconate) soap before surgery.  CHG is an antiseptic cleaner which kills germs and bonds with the skin to continue killing germs even after washing. Please DO NOT use if you have an allergy to CHG or antibacterial soaps.  If your skin becomes reddened/irritated stop using the CHG and inform your nurse when you arrive at Short Stay. Do not shave (including legs and underarms) for at least 48 hours prior to the first CHG shower.   Please follow these instructions carefully:  1.  Shower with CHG Soap the night before surgery and the  morning of Surgery.  2.  If you choose to wash your hair, wash your hair first as usual with your  normal  shampoo.  3.  After you shampoo, rinse your hair and body thoroughly to remove  the  shampoo.                            4.  Use CHG as you would any other liquid soap.  You can apply chg directly  to the skin and wash                       Gently with a scrungie or clean washcloth.  5.  Apply the CHG Soap to your body ONLY FROM THE NECK DOWN.   Do not use on face/ open                           Wound or open sores. Avoid contact with eyes, ears mouth and genitals (private parts).                       Wash face,  Genitals (private parts) with your normal soap.  6.  Wash thoroughly, paying special attention to the area where your surgery  will be performed.  7.  Thoroughly rinse your body with warm water from the neck down.  8.  DO NOT shower/wash with your normal soap after using and rinsing off  the CHG Soap.                9.  Pat yourself dry with a clean towel.            10.  Wear clean pajamas.            11.  Place clean sheets on your bed the night of your first shower and do not  sleep with pets. Day of Surgery : Do not apply any lotions/deodorants the morning of surgery.  Please wear clean clothes to the hospital/surgery center.  FAILURE TO FOLLOW THESE INSTRUCTIONS MAY RESULT IN THE CANCELLATION OF YOUR SURGERY PATIENT SIGNATURE_________________________________  NURSE SIGNATURE__________________________________  ________________________________________________________________________

## 2021-07-09 ENCOUNTER — Encounter (HOSPITAL_COMMUNITY): Payer: Self-pay

## 2021-07-09 ENCOUNTER — Encounter (HOSPITAL_COMMUNITY)
Admission: RE | Admit: 2021-07-09 | Discharge: 2021-07-09 | Disposition: A | Discharge status: Home / Self Care | Payer: Medicare Other | Source: Ambulatory Visit | Attending: Orthopedic Surgery | Admitting: Orthopedic Surgery

## 2021-07-09 ENCOUNTER — Other Ambulatory Visit: Payer: Self-pay

## 2021-07-09 ENCOUNTER — Telehealth: Payer: Self-pay | Admitting: Psychiatry

## 2021-07-09 DIAGNOSIS — I10 Essential (primary) hypertension: Secondary | ICD-10-CM | POA: Insufficient documentation

## 2021-07-09 DIAGNOSIS — I2510 Atherosclerotic heart disease of native coronary artery without angina pectoris: Secondary | ICD-10-CM | POA: Insufficient documentation

## 2021-07-09 DIAGNOSIS — K219 Gastro-esophageal reflux disease without esophagitis: Secondary | ICD-10-CM | POA: Insufficient documentation

## 2021-07-09 DIAGNOSIS — M26609 Unspecified temporomandibular joint disorder, unspecified side: Secondary | ICD-10-CM | POA: Insufficient documentation

## 2021-07-09 DIAGNOSIS — J449 Chronic obstructive pulmonary disease, unspecified: Secondary | ICD-10-CM | POA: Insufficient documentation

## 2021-07-09 DIAGNOSIS — Z87891 Personal history of nicotine dependence: Secondary | ICD-10-CM | POA: Diagnosis not present

## 2021-07-09 DIAGNOSIS — S83242A Other tear of medial meniscus, current injury, left knee, initial encounter: Secondary | ICD-10-CM | POA: Diagnosis not present

## 2021-07-09 DIAGNOSIS — X58XXXA Exposure to other specified factors, initial encounter: Secondary | ICD-10-CM | POA: Diagnosis not present

## 2021-07-09 DIAGNOSIS — Z01812 Encounter for preprocedural laboratory examination: Secondary | ICD-10-CM | POA: Diagnosis not present

## 2021-07-09 DIAGNOSIS — M94262 Chondromalacia, left knee: Secondary | ICD-10-CM | POA: Diagnosis not present

## 2021-07-09 DIAGNOSIS — S83242D Other tear of medial meniscus, current injury, left knee, subsequent encounter: Secondary | ICD-10-CM

## 2021-07-09 DIAGNOSIS — Z7982 Long term (current) use of aspirin: Secondary | ICD-10-CM | POA: Diagnosis not present

## 2021-07-09 HISTORY — DX: Insomnia, unspecified: G47.00

## 2021-07-09 LAB — COMPREHENSIVE METABOLIC PANEL
ALT: 21 U/L (ref 0–44)
AST: 23 U/L (ref 15–41)
Albumin: 4.5 g/dL (ref 3.5–5.0)
Alkaline Phosphatase: 97 U/L (ref 38–126)
Anion gap: 7 (ref 5–15)
BUN: 11 mg/dL (ref 8–23)
CO2: 28 mmol/L (ref 22–32)
Calcium: 9.1 mg/dL (ref 8.9–10.3)
Chloride: 103 mmol/L (ref 98–111)
Creatinine, Ser: 0.61 mg/dL (ref 0.44–1.00)
GFR, Estimated: >60 mL/min (ref >60)
Glucose, Bld: 86 mg/dL (ref 70–99)
Potassium: 4.3 mmol/L (ref 3.5–5.1)
Sodium: 138 mmol/L (ref 135–145)
Total Bilirubin: 0.3 mg/dL (ref 0.3–1.2)
Total Protein: 7.7 g/dL (ref 6.5–8.1)

## 2021-07-09 LAB — CBC
HCT: 39.2 % (ref 36.0–46.0)
Hemoglobin: 12.4 g/dL (ref 12.0–15.0)
MCH: 28.2 pg (ref 26.0–34.0)
MCHC: 31.6 g/dL (ref 30.0–36.0)
MCV: 89.3 fL (ref 80.0–100.0)
Platelets: 297 10*3/uL (ref 150–400)
RBC: 4.39 MIL/uL (ref 3.87–5.11)
RDW: 14.2 % (ref 11.5–15.5)
WBC: 5.4 10*3/uL (ref 4.0–10.5)
nRBC: 0 % (ref 0.0–0.2)

## 2021-07-09 NOTE — Telephone Encounter (Signed)
Pt stated she has been having trouble falling asleep and staying asleep.When she is able to fall asleep around 11 pm,she is up 2-3 hours later.She stated she gets between 2-5 hours of sleep a night.She was on trazodone in the past and it did not work.

## 2021-07-09 NOTE — Progress Notes (Signed)
COVID test-NA   PCP - [facility] Cardiologist - Dr. Linard Millers  Chest x-ray - no EKG - 03/08/21-epic Stress Test - 2018 ECHO - 2018 Cardiac Cath - 2008,2013,2018 Pacemaker/ICD device last checked:NA  Sleep Study - no CPAP -   Fasting Blood Sugar - NA Checks Blood Sugar _____ times a day  Blood Thinner Instructions:ASA 81/ Dr. Tamala Julian Aspirin Instructions:Stop 5 days prior to DOS/ Dr. Alvan Dame Last Dose:07/04/21  Anesthesia review: yes  Patient denies shortness of breath, fever, cough and chest pain at PAT appointment Pt reports no SOB with any activities. She has PONV after surgery because of hunger.   Patient verbalized understanding of instructions that were given to them at the PAT appointment. Patient was also instructed that they will need to review over the PAT instructions again at home before surgery. Yes

## 2021-07-09 NOTE — Telephone Encounter (Signed)
Pt lm that she is not sleeping. She is having a hard time falling asleep. When she does sleep it is only for a few hours. She is taking seroquel now and she said that she doesn't want to increase the medicine because it takes her and hour and a half to get out of bed every morning. She would like something different. Please give her a call at [phone]

## 2021-07-10 ENCOUNTER — Other Ambulatory Visit: Payer: Self-pay | Admitting: Psychiatry

## 2021-07-10 DIAGNOSIS — F5101 Primary insomnia: Secondary | ICD-10-CM

## 2021-07-10 MED ORDER — HYDROXYZINE HCL 25 MG PO TABS: 25.0000 mg | ORAL_TABLET | Freq: Every evening | ORAL | 0 refills | Status: DC | PRN

## 2021-07-10 NOTE — Progress Notes (Signed)
Anesthesia Chart Review   Case: [id number] Date/Time: 07/12/21 1245   Procedure: KNEE ARTHROSCOPY WITH PARTIAL MEDIAL MENISECTOMY, CHONDROPLASTY (Left: Knee)   Anesthesia type: Choice   Pre-op diagnosis: Left knee medial meniscal tear, chondromalacia medial patellofemoral   Location: [name]   Surgeons: Paralee Cancel, MD       DISCUSSION:70 y.o. former smoker with h/o PONV, GERD, HTN, CAD, mild COPD, TMJ, left knee medial meniscal tear scheduled for above procedure 07/12/21 with Dr. Paralee Cancel.   Per cardiology 05/11/21 preoperative evaluation 05/11/21, "Chart reviewed as part of pre-operative protocol coverage. Given past medical history and time since last visit, based on ACC/AHA guidelines, [name] would be at acceptable risk for the planned procedure without further cardiovascular testing.    Her RCRI is a class III risk, 6.6% risk of major cardiac event.   Her aspirin may be held for 5 just 7 days prior to her procedure.  Please resume as soon as hemostasis is achieved."  Anticipate pt can proceed with planned procedure barring acute status change.   VS: BP 129/66    Pulse (!) 58    Temp 36.9 C (Oral)    Resp 18    Ht 5' 2.5" (1.588 m)    Wt 57.2 kg    SpO2 100%    BMI 22.68 kg/m   PROVIDERS: Scheryl Marten, [place] is PCP   Daneen Schick, MD is Cardiologist  LABS: Labs reviewed: Acceptable for surgery. (all labs ordered are listed, but only abnormal results are displayed)  Labs Reviewed  CBC  COMPREHENSIVE METABOLIC PANEL  TYPE AND SCREEN     IMAGES:   EKG: 03/08/2021 Rate 59 bpm  Sinus bradycardia   CV: Echo 03/25/2017 Study Conclusions   - Left ventricle: The cavity size was normal. Wall thickness was    normal. Systolic function was normal. The estimated ejection    fraction was in the range of 60% to 65%. Wall motion was normal;    there were no regional wall motion abnormalities. Features are    consistent with a pseudonormal left  ventricular filling pattern,    with concomitant abnormal relaxation and increased filling    pressure (grade 2 diastolic dysfunction).   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 EF 25-35% with mildly elevated LVEDP. Severe diffuse calcification of the proximal to mid left anterior descending. 30-40% proximal LAD, 60-70% mid LAD, ostial 80-90% first diagonal, with mild luminal irregularities in the right coronary and circumflex coronary arteries.   RECOMMENDATIONS:   When compared to prior angiography, no significant change has occurred other than the presence of the wall motion abnormality identified above. Perhaps the first diagonal obstruction is slightly worse. Did not consider intervention on the diagonal as it does not explain the patient's wall motion abnormality. 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  Stress Test 11/12/2016 Blood pressure demonstrated a normal response to exercise. There was no ST segment deviation noted during stress. 7 minutes and 13 seconds of exercise him a good effort with no chest pain Low risk exercise treadmill test with no electrocardiographic evidence of ischemia Past Medical History:  Diagnosis Date   Anxiety    Psychiatry Dr. Stan Head, concerns for memory loss, anxiety   Broken heart syndrome    Cancer [facility])    bladder CA   Coronary atherosclerosis CARDIOLOGIST-  DR Daneen Schick   Cath 11/2006 demonstrating moderate obstructive coronary disease  with 70-80% septal perforator #1, 50-70% first diagonal, 80% third diagonal, 50% mid and distal LAD & heavy calcification throughout all 3 coronaries. LVF normal.   Essential hypertension, benign    Fatty liver 04/28/2012   GERD (gastroesophageal reflux disease)    Heart murmur    History of esophageal spasm    takes ranexa   History of TIA (transient ischemic attack)    2011--  no  residuals   Hyperlipidemia    IBS (irritable bowel syndrome)    Diarrhea predominent, esophageal spasm severe, GERD - Dr. Elicia Lamp   Insomnia    Interstitial cystitis    Dr. Gaynelle Arabian   Memory loss    Psychiatry Dr. Stan Head, concerns for memory loss, anxiety   PONV (postoperative nausea and vomiting)    AND URINARY RETENTION   TMJ (temporomandibular joint syndrome)    Wears glasses     Past Surgical History:  Procedure Laterality Date   ABDOMINAL HYSTERECTOMY  AGE 9 -- [place]   AND OVARIAN CYSTECTOMY   BENIGN EXCISIONAL BREAST BX  1996   BREAST EXCISIONAL BIOPSY Right 1992   CARDIAC CATHETERIZATION  11-28-2006   DR Mallie Mussel SMITH    moderate cad/  70-80% septal perforator #1, 50-70% first diagonal, 80% third diagonal, 50% mid and distal LAD & heavy calcification throughout all 3 coronaries. LVF normal.   CARDIAC CATHETERIZATION  03-26-2012   DR Daneen Schick   patent CFX,  RCA,  & pLAD/  mLAD >50% to <70% a focal eccentric region that overlaps the third diagonal/  90% diagonal ostial and unchanged from prior study /   ef 65%   CARPAL TUNNEL RELEASE Bilateral    CYSTO WITH HYDRODISTENSION N/A 01/14/2014   Procedure: CYSTOSCOPY/HYDRODISTENSION/INSERTION OF MARCAINE AND PYRIDUIM INTO BLADDER/INJECTION OF MARCAINE AND KENALOG into sub trigone space ;  Surgeon: Ailene Rud, MD;  Location: [facility];  Service: Urology;  Laterality: N/A;   CYSTO/  HYDRODISTENTION/  INSTILLATION THERAPY  03/12/2010   ESOPHAGOGASTRODUODENOSCOPY  04/30/2012   Procedure: ESOPHAGOGASTRODUODENOSCOPY (EGD);  Surgeon: Lafayette Dragon, MD;  Location: [facility];  Service: Endoscopy;  Laterality: N/A;   EYE SURGERY Bilateral 2022   LEFT HEART CATH AND CORONARY ANGIOGRAPHY N/A 02/18/2017   Procedure: LEFT HEART CATH AND CORONARY ANGIOGRAPHY;  Surgeon: Belva Crome, MD;  Location: [facility];  Service: Cardiovascular;  Laterality: N/A;   LUMBAR [place] ARTHROSCOPY Left 12/23/2011   SHOULDER SURGERY Right 2010   TRANSTHORACIC ECHOCARDIOGRAM  04/21/2012   GRADE I DIASTOLIC DYSFUNCTION/  EF 60-65%/  MILD MR  &  TR   TRANSURETHRAL RESECTION OF BLADDER TUMOR Right 01/03/2015   Procedure: TRANSURETHRAL RESECTION OF BLADDER TUMOR (TURBT);  Surgeon: Carolan Clines, MD;  Location: [facility];  Service: Urology;  Laterality: Right;   TRIGGER FINGER RELEASE Bilateral 2017    MEDICATIONS:  ARTIFICIAL TEAR SOLUTION OP   aspirin 81 MG tablet   atorvastatin (LIPITOR) 40 MG tablet   Cholecalciferol (VITAMIN D) 50 MCG (2000 UT) tablet   cyclobenzaprine (FLEXERIL) 10 MG tablet   dicyclomine (BENTYL) 10 MG capsule   Digestive Enzymes (MULTI-ENZYME) TABS   diltiazem 2 % GEL   diphenhydrAMINE (BENADRYL) 25 MG tablet   ezetimibe (ZETIA) 10 MG tablet   famotidine (PEPCID) 20 MG tablet   famotidine (PEPCID) 40 MG tablet   hyoscyamine (LEVSIN SL) 0.125 MG SL tablet   ibuprofen (ADVIL,MOTRIN) 200 MG  tablet   linaclotide (LINZESS) 145 MCG CAPS capsule   nebivolol (BYSTOLIC) 2.5 MG tablet   NEXIUM 40 MG capsule   nitroGLYCERIN (NITROSTAT) 0.4 MG SL tablet   Peppermint Oil 90 MG CPCR   Probiotic Product (PROBIOTIC PO)   QUEtiapine (SEROQUEL) 25 MG tablet   ranolazine (RANEXA) 500 MG 12 hr tablet   sodium chloride (MURO 128) 5 % ophthalmic solution   sucralfate (CARAFATE) 1 g tablet   traMADol (ULTRAM) 50 MG tablet   No current facility-administered medications for this encounter.    Konrad Felix Ward, PA-C [facility] [phone]

## 2021-07-10 NOTE — Telephone Encounter (Signed)
I sent in a prescription for hydroxyzine.  She can try it.  I would strongly recommend that she add it to the Seroquel initially.  Then if it works she can try to taper off the Seroquel slowly.  If she stops the Seroquel abruptly the hydroxyzine will not work.

## 2021-07-11 NOTE — Anesthesia Preprocedure Evaluation (Addendum)
Anesthesia Evaluation  Patient identified by MRN, date of birth, ID band Patient awake    Reviewed: Allergy & Precautions, NPO status , Patient's Chart, lab work & pertinent test results  History of Anesthesia Complications (+) PONV  Airway Mallampati: I  TM Distance: >3 FB Neck ROM: Full  Mouth opening: Limited Mouth Opening  Dental no notable dental hx. (+) Implants, Dental Advisory Given, Teeth Intact   Pulmonary COPD, former smoker,    Pulmonary exam normal breath sounds clear to auscultation       Cardiovascular hypertension, + CAD  Normal cardiovascular exam+ Valvular Problems/Murmurs  Rhythm:Regular Rate:Normal  2018 Echo Left ventricle: The cavity size was normal. Wall thickness was  normal. Systolic function was normal. The estimated ejection  fraction was in the range of 60% to 65%. Wall motion was normal;  there were no regional wall motion abnormalities. Features are  consistent with a pseudonormal left ventricular filling pattern,  with concomitant abnormal relaxation and increased filling  pressure (grade 2 diastolic dysfunction   Neuro/Psych    GI/Hepatic GERD  ,  Endo/Other    Renal/GU Lab Results      Component                Value               Date                      CREATININE               0.61                07/09/2021                BUN                      11                  07/09/2021                NA                       138                 07/09/2021                K                        4.3                 07/09/2021                CL                       103                 07/09/2021                CO2                      28                  07/09/2021                Musculoskeletal  (+) Arthritis ,   Abdominal   Peds  Hematology Lab Results      Component  Value               Date                      WBC                      5.4                  07/09/2021                HGB                      12.4                07/09/2021                HCT                      39.2                07/09/2021                MCV                      89.3                07/09/2021                PLT                      297                 07/09/2021              Anesthesia Other Findings ALL: See list  Reproductive/Obstetrics                            Anesthesia Physical Anesthesia Plan  ASA: 3  Anesthesia Plan: General and Regional   Post-op Pain Management: Regional block   Induction:   PONV Risk Score and Plan: 3 and TIVA, Treatment may vary due to age or medical condition, Ondansetron, Propofol infusion and Midazolam  Airway Management Planned: LMA  Additional Equipment: None  Intra-op Plan:   Post-operative Plan: Extubation in OR  Informed Consent: I have reviewed the patients History and Physical, chart, labs and discussed the procedure including the risks, benefits and alternatives for the proposed anesthesia with the patient or authorized representative who has indicated his/her understanding and acceptance.     Dental advisory given  Plan Discussed with: CRNA and Anesthesiologist  Anesthesia Plan Comments:        Anesthesia Quick Evaluation

## 2021-07-11 NOTE — Telephone Encounter (Signed)
Pt informed

## 2021-07-12 ENCOUNTER — Ambulatory Visit (HOSPITAL_COMMUNITY): Payer: Medicare Other | Admitting: Physician Assistant

## 2021-07-12 ENCOUNTER — Encounter (HOSPITAL_COMMUNITY)
Admission: RE | Disposition: A | Discharge status: Home / Self Care | Payer: Self-pay | Source: Home / Self Care | Attending: Orthopedic Surgery

## 2021-07-12 ENCOUNTER — Ambulatory Visit (HOSPITAL_COMMUNITY)
Admission: RE | Admit: 2021-07-12 | Discharge: 2021-07-12 | Disposition: A | Discharge status: Home / Self Care | Payer: Medicare Other | Attending: Orthopedic Surgery | Admitting: Orthopedic Surgery

## 2021-07-12 ENCOUNTER — Ambulatory Visit (HOSPITAL_COMMUNITY): Payer: Medicare Other | Admitting: Anesthesiology

## 2021-07-12 DIAGNOSIS — M94262 Chondromalacia, left knee: Secondary | ICD-10-CM | POA: Insufficient documentation

## 2021-07-12 DIAGNOSIS — S83242D Other tear of medial meniscus, current injury, left knee, subsequent encounter: Secondary | ICD-10-CM

## 2021-07-12 DIAGNOSIS — I10 Essential (primary) hypertension: Secondary | ICD-10-CM | POA: Diagnosis not present

## 2021-07-12 DIAGNOSIS — I2510 Atherosclerotic heart disease of native coronary artery without angina pectoris: Secondary | ICD-10-CM | POA: Insufficient documentation

## 2021-07-12 DIAGNOSIS — S83232A Complex tear of medial meniscus, current injury, left knee, initial encounter: Secondary | ICD-10-CM | POA: Diagnosis not present

## 2021-07-12 DIAGNOSIS — S83242A Other tear of medial meniscus, current injury, left knee, initial encounter: Secondary | ICD-10-CM | POA: Diagnosis present

## 2021-07-12 DIAGNOSIS — M2242 Chondromalacia patellae, left knee: Secondary | ICD-10-CM | POA: Diagnosis not present

## 2021-07-12 DIAGNOSIS — Z87891 Personal history of nicotine dependence: Secondary | ICD-10-CM | POA: Insufficient documentation

## 2021-07-12 DIAGNOSIS — K219 Gastro-esophageal reflux disease without esophagitis: Secondary | ICD-10-CM | POA: Diagnosis not present

## 2021-07-12 DIAGNOSIS — J449 Chronic obstructive pulmonary disease, unspecified: Secondary | ICD-10-CM | POA: Insufficient documentation

## 2021-07-12 DIAGNOSIS — X58XXXA Exposure to other specified factors, initial encounter: Secondary | ICD-10-CM | POA: Diagnosis not present

## 2021-07-12 DIAGNOSIS — G8918 Other acute postprocedural pain: Secondary | ICD-10-CM | POA: Diagnosis not present

## 2021-07-12 HISTORY — PX: KNEE ARTHROSCOPY WITH MEDIAL MENISECTOMY: SHX5651

## 2021-07-12 LAB — TYPE AND SCREEN
ABO/RH(D): O POS
Antibody Screen: NEGATIVE

## 2021-07-12 SURGERY — ARTHROSCOPY, KNEE, WITH MEDIAL MENISCECTOMY
Anesthesia: Regional | Site: Knee | Laterality: Left

## 2021-07-12 MED ORDER — LACTATED RINGERS IV SOLN: INTRAVENOUS | Status: DC

## 2021-07-12 MED ORDER — PROPOFOL 10 MG/ML IV BOLUS
INTRAVENOUS | Status: AC
  Filled 2021-07-12: qty 20

## 2021-07-12 MED ORDER — LACTATED RINGERS IV BOLUS: 250.0000 mL | Freq: Once | INTRAVENOUS | Status: DC

## 2021-07-12 MED ORDER — PROPOFOL 500 MG/50ML IV EMUL
INTRAVENOUS | Status: DC | PRN
  Administered 2021-07-12: 150 mg via INTRAVENOUS
  Administered 2021-07-12: 75 ug/kg/min via INTRAVENOUS

## 2021-07-12 MED ORDER — FENTANYL CITRATE PF 50 MCG/ML IJ SOSY
50.0000 ug | PREFILLED_SYRINGE | INTRAMUSCULAR | Status: DC
  Administered 2021-07-12: 50 ug via INTRAVENOUS
  Filled 2021-07-12: qty 2

## 2021-07-12 MED ORDER — ONDANSETRON HCL 4 MG/2ML IJ SOLN
INTRAMUSCULAR | Status: AC
  Filled 2021-07-12: qty 2

## 2021-07-12 MED ORDER — LIDOCAINE HCL (PF) 2 % IJ SOLN
INTRAMUSCULAR | Status: AC
  Filled 2021-07-12: qty 5

## 2021-07-12 MED ORDER — EPHEDRINE SULFATE-NACL 50-0.9 MG/10ML-% IV SOSY
PREFILLED_SYRINGE | INTRAVENOUS | Status: DC | PRN
  Administered 2021-07-12: 10 mg via INTRAVENOUS

## 2021-07-12 MED ORDER — DEXAMETHASONE SODIUM PHOSPHATE 10 MG/ML IJ SOLN
INTRAMUSCULAR | Status: DC | PRN
  Administered 2021-07-12: 10 mg via INTRAVENOUS

## 2021-07-12 MED ORDER — ACETAMINOPHEN 10 MG/ML IV SOLN: 1000.0000 mg | Freq: Once | INTRAVENOUS | Status: DC | PRN

## 2021-07-12 MED ORDER — ONDANSETRON HCL 4 MG/2ML IJ SOLN
INTRAMUSCULAR | Status: DC | PRN
  Administered 2021-07-12: 4 mg via INTRAVENOUS

## 2021-07-12 MED ORDER — CEFAZOLIN SODIUM-DEXTROSE 2-4 GM/100ML-% IV SOLN: 2.0000 g | Freq: Four times a day (QID) | INTRAVENOUS | Status: DC

## 2021-07-12 MED ORDER — SODIUM CHLORIDE 0.9 % IR SOLN
Status: DC | PRN
  Administered 2021-07-12: 3000 mL

## 2021-07-12 MED ORDER — LACTATED RINGERS IV BOLUS
500.0000 mL | Freq: Once | INTRAVENOUS | Status: AC
  Administered 2021-07-12: 500 mL via INTRAVENOUS

## 2021-07-12 MED ORDER — DEXAMETHASONE SODIUM PHOSPHATE 10 MG/ML IJ SOLN
INTRAMUSCULAR | Status: AC
  Filled 2021-07-12: qty 1

## 2021-07-12 MED ORDER — MIDAZOLAM HCL 2 MG/2ML IJ SOLN
INTRAMUSCULAR | Status: AC
  Filled 2021-07-12: qty 2

## 2021-07-12 MED ORDER — ORAL CARE MOUTH RINSE: 15.0000 mL | Freq: Once | OROMUCOSAL | Status: AC

## 2021-07-12 MED ORDER — FENTANYL CITRATE (PF) 100 MCG/2ML IJ SOLN
INTRAMUSCULAR | Status: DC | PRN
Start: 2021-07-12 — End: 2021-07-12
  Administered 2021-07-12: 25 ug via INTRAVENOUS

## 2021-07-12 MED ORDER — ONDANSETRON HCL 4 MG/2ML IJ SOLN: 4.0000 mg | Freq: Once | INTRAMUSCULAR | Status: DC | PRN

## 2021-07-12 MED ORDER — CEFAZOLIN SODIUM-DEXTROSE 2-4 GM/100ML-% IV SOLN
2.0000 g | INTRAVENOUS | Status: DC
  Filled 2021-07-12: qty 100

## 2021-07-12 MED ORDER — METHOCARBAMOL 500 MG IVPB - SIMPLE MED: 500.0000 mg | Freq: Four times a day (QID) | INTRAVENOUS | Status: DC | PRN

## 2021-07-12 MED ORDER — FENTANYL CITRATE PF 50 MCG/ML IJ SOSY: 25.0000 ug | PREFILLED_SYRINGE | INTRAMUSCULAR | Status: DC | PRN

## 2021-07-12 MED ORDER — CHLORHEXIDINE GLUCONATE 0.12 % MT SOLN
15.0000 mL | Freq: Once | OROMUCOSAL | Status: AC
  Administered 2021-07-12: 15 mL via OROMUCOSAL

## 2021-07-12 MED ORDER — METHOCARBAMOL 500 MG PO TABS: 500.0000 mg | ORAL_TABLET | Freq: Four times a day (QID) | ORAL | Status: DC | PRN

## 2021-07-12 MED ORDER — ROPIVACAINE HCL 5 MG/ML IJ SOLN
INTRAMUSCULAR | Status: AC
  Filled 2021-07-12: qty 30

## 2021-07-12 MED ORDER — MIDAZOLAM HCL 2 MG/2ML IJ SOLN
1.0000 mg | INTRAMUSCULAR | Status: DC
  Administered 2021-07-12: 2 mg via INTRAVENOUS
  Filled 2021-07-12: qty 2

## 2021-07-12 MED ORDER — FENTANYL CITRATE (PF) 100 MCG/2ML IJ SOLN
INTRAMUSCULAR | Status: AC
  Filled 2021-07-12: qty 2

## 2021-07-12 SURGICAL SUPPLY — 28 items
BAG COUNTER SPONGE SURGICOUNT (BAG) ×2 IMPLANT
BAG SPNG CNTER NS LX DISP (BAG)
BNDG CMPR MED 10X6 ELC LF (GAUZE/BANDAGES/DRESSINGS) ×1
BNDG ELASTIC 6X10 VLCR STRL LF (GAUZE/BANDAGES/DRESSINGS) ×3 IMPLANT
BNDG ELASTIC 6X5.8 VLCR STR LF (GAUZE/BANDAGES/DRESSINGS) ×1 IMPLANT
DRSG EMULSION OIL 3X3 NADH (GAUZE/BANDAGES/DRESSINGS) ×3 IMPLANT
DRSG PAD ABDOMINAL 8X10 ST (GAUZE/BANDAGES/DRESSINGS) ×6 IMPLANT
DURAPREP 26ML APPLICATOR (WOUND CARE) ×3 IMPLANT
EXCALIBUR 3.8MM X 13CM (MISCELLANEOUS) ×3 IMPLANT
GAUZE SPONGE 4X4 12PLY STRL (GAUZE/BANDAGES/DRESSINGS) ×3 IMPLANT
GAUZE XEROFORM 1X8 LF (GAUZE/BANDAGES/DRESSINGS) ×3 IMPLANT
GLOVE SURG ENC TEXT LTX SZ7 (GLOVE) ×3 IMPLANT
GLOVE SURG UNDER POLY LF SZ7.5 (GLOVE) ×3 IMPLANT
KIT BASIN OR (CUSTOM PROCEDURE TRAY) ×3 IMPLANT
KIT TURNOVER KIT A (KITS) ×3 IMPLANT
LEGGING LITHOTOMY PAIR STRL (DRAPES) ×2 IMPLANT
MANIFOLD NEPTUNE II (INSTRUMENTS) ×3 IMPLANT
PACK ARTHROSCOPY DSU (CUSTOM PROCEDURE TRAY) ×3 IMPLANT
PAD ARMBOARD 7.5X6 YLW CONV (MISCELLANEOUS) IMPLANT
PAD MASON LEG HOLDER (PIN) ×3 IMPLANT
PADDING CAST ABS 6INX4YD NS (CAST SUPPLIES) ×1
PADDING CAST ABS COTTON 6X4 NS (CAST SUPPLIES) ×2 IMPLANT
PADDING CAST COTTON 6X4 STRL (CAST SUPPLIES) ×1 IMPLANT
SPONGE T-LAP 18X18 ~~LOC~~+RFID (SPONGE) ×3 IMPLANT
SUT ETHILON 4 0 PS 2 18 (SUTURE) ×3 IMPLANT
SYR 20ML LL LF (SYRINGE) ×3 IMPLANT
TOWEL OR 17X26 10 PK STRL BLUE (TOWEL DISPOSABLE) ×6 IMPLANT
TUBING ARTHROSCOPY IRRIG 16FT (MISCELLANEOUS) ×3 IMPLANT

## 2021-07-12 NOTE — Anesthesia Postprocedure Evaluation (Signed)
Anesthesia Post Note  Patient: [name]  Procedure(s) Performed: KNEE ARTHROSCOPY WITH PARTIAL MEDIAL MENISECTOMY, CHONDROPLASTY (Left: Knee)     Patient location during evaluation: PACU Anesthesia Type: Regional and General Level of consciousness: awake and alert Pain management: pain level controlled Vital Signs Assessment: post-procedure vital signs reviewed and stable Respiratory status: spontaneous breathing, nonlabored ventilation, respiratory function stable and patient connected to nasal cannula oxygen Cardiovascular status: blood pressure returned to baseline and stable Postop Assessment: no apparent nausea or vomiting Anesthetic complications: no   No notable events documented.  Last Vitals:  Vitals:   07/12/21 1415 07/12/21 1530  BP: (!) 130/56 (!) 125/56  Pulse: (!) 53 (!) 55  Resp: 11 15  Temp: (!) 36.4 C 36.4 C  SpO2: 98% 100%    Last Pain:  Vitals:   07/12/21 1530  TempSrc:   PainSc: 0-No pain                 Barnet Glasgow

## 2021-07-12 NOTE — Brief Op Note (Signed)
07/12/2021  12:40 PM  PATIENT:  [name]  71 y.o. female  PRE-OPERATIVE DIAGNOSIS:  Left knee medial meniscal tear, chondromalacia medial patellofemoral  POST-OPERATIVE DIAGNOSIS:  1. Complex medial meniscal tear 2. 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 Left knee medial partial meniscectomy, 2. Left knee medial femoral condyle and patellofemoral compartment debridement chondroplasty  SURGEON:  Surgeon(s) and Role:    Paralee Cancel, MD - Primary  ANESTHESIA:   local, regional, and general  EBL:  None  BLOOD ADMINISTERED:none  DRAINS: none   LOCAL MEDICATIONS USED:  MARCAINE     SPECIMEN:  No Specimen  DISPOSITION OF SPECIMEN:  N/A  COUNTS:  YES  TOURNIQUET:  NA  DICTATION: .Other Dictation: Dictation Number [id number]  PLAN OF CARE: Discharge to home after PACU  PATIENT DISPOSITION:  PACU - hemodynamically stable.   Delay start of Pharmacological VTE agent (>24hrs) due to surgical blood loss or risk of bleeding: not applicable

## 2021-07-12 NOTE — Op Note (Signed)
[name], [name] MEDICAL RECORD NO: [id number] ACCOUNT NO: [id number] DATE OF BIRTH: [birth year] FACILITY: [facility] LOCATION: [facility] PHYSICIAN: Pietro Cassis. Alvan Dame, MD  Operative Report   DATE OF PROCEDURE: 07/12/2021  PREOPERATIVE DIAGNOSIS:  Left knee medial meniscal tear associated with chondromalacia within the medial and patellofemoral compartments.  POSTOPERATIVE DIAGNOSES/FINDINGS: 1.  Complex tear involving the entire posterior aspect of the medial meniscus from the posterior horn to the mid body.  There was a combination of cleavage tears as well as radial tears, degenerative in nature. 2.  Grade 2-3 chondromalacia of the medial femoral condyle. 3.  Grade 2-3 chondromalacia within the patellofemoral joint, predominantly affecting the trochlea in deep flexion.  PROCEDURE: 1.  Left knee diagnostic and operative arthroscopy with partial medial meniscectomy. 2.  Medial and patellofemoral chondroplasty with chondral debridement, no microfracture or abrasion chondroplasty.  SURGEON:  Pietro Cassis. Alvan Dame, MD  ASSISTANT:  Surgical team.  ANESTHESIA:  Preoperative adductor canal block plus general LMA.  SPECIMENS:  None.  COMPLICATIONS:  None.  INDICATIONS OF PROCEDURE:  The patient is a 71 year old female who presented to the office for evaluation and management of left knee pain.  Her predominant findings were tenderness in the medial joint line.  She did not respond to conservative treatment  and thus an MRI was ordered.  MRI revealed suspicion of a medial meniscal tear, but also defined findings of chondromalacia within the medial and patellofemoral compartments.  Given the persistent and recurrent symptoms within her left knee, she wished  to proceed with surgery.  We discussed the minimal risk of infection and DVT.  We discussed the indications for proceeding with arthroscopic surgery as opposed to more advanced arthroplasty options, which she was not indicated for based on the  preserved  cartilage radiographically as well as by MRI.  Consent was obtained for the benefit of pain management.  PROCEDURE IN DETAIL:  The patient was brought to the operative theater.  Once adequate anesthesia, preoperative antibiotics, Ancef administered, she was positioned supine with the left leg in leg holder.  Left lower extremity was then prepped and draped  in sterile fashion.  Timeout was performed identifying the patient, planned procedure, and extremity.  Two portal technique was utilized.  Inferolateral portal was utilized for the arthroscopic camera.  Diagnostic evaluation of the knee revealed the  above findings.  The inferior medial portal was created.  Following probe examination of the medial meniscus identifying the complexity of the tear posteriorly, I used a combination of the straight biting baskets as well as a 3.8 mm arthroscopic shaver  to perform partial meniscectomy back to a stable level.  Her posterior root was intact.  I removed probably 30% of her meniscus from the posterior horn to the mid body with the remaining meniscus intact.  Additionally, the 3.8 shaver was utilized for  debridement purposes of the chondral flaps on the lateral aspect of the distal medial femoral condyle.  This was the predominant area affected.  The remainder of the cartilage appeared to be intact without evidence of any exposed bone.  Her tibial  plateau surface on this side was intact as well with only mild degenerative changes.  Laterally, her joint space was preserved without evidence of meniscal or articular cartilage pathology.  Within the patellofemoral compartment, she was noted to have some grade 2-3 chondromalacia on the undersurface of the patella, which was relatively mild in comparison to the deep trochlear region.  The 3.5 coude shaver was used to debride unstable  flaps  in this area.  Once this was done, I reexamined the knee to make certain I was satisfied with the stability of  the remaining meniscal and articular surfaces.  I determined that there was no further debridement that was necessary.  There were no loose  fragments of cartilage floating in the knee.  The instrumentation was removed from the knee and the portal sites were reapproximated using 4-0 nylon.  The knee was injected at the end of the case with 0.2% ropivacaine.  The knee was then wrapped into a  sterile bulky Jones dressing.  She was then awoken from anesthesia and brought to the recovery room in stable condition, tolerating the procedure well.  Findings reviewed with her spouse.  I will see her back in the office in 10-12 days for suture  removal and to review findings. Initiation of physical therapy will be determined based on the patient's desires.   VAI D: 07/12/2021 1:49:46 pm T: 07/12/2021 10:18:00 pm  JOB: [id number]/ [id number]

## 2021-07-12 NOTE — Transfer of Care (Signed)
Immediate Anesthesia Transfer of Care Note  Patient: [name]  Procedure(s) Performed: Procedure(s): KNEE ARTHROSCOPY WITH PARTIAL MEDIAL MENISECTOMY, CHONDROPLASTY (Left)  Patient Location: PACU  Anesthesia Type:General  Level of Consciousness: Alert, Awake, Oriented  Airway & Oxygen Therapy: Patient Spontanous Breathing  Post-op Assessment: Report given to RN  Post vital signs: Reviewed and stable  Last Vitals:  Vitals:   07/12/21 1225 07/12/21 1230  BP: (!) 145/64 (!) 122/57  Pulse: (!) 58 (!) 54  Resp: 13 11  Temp:    SpO2: 923% 300%    Complications: No apparent anesthesia complications

## 2021-07-12 NOTE — Interval H&P Note (Signed)
History and Physical Interval Note:  07/12/2021 11:46 AM  [name]  has presented today for surgery, with the diagnosis of Left knee medial meniscal tear, chondromalacia medial patellofemoral.  The various methods of treatment have been discussed with the patient and family. After consideration of risks, benefits and other options for treatment, the patient has consented to  Procedure(s): KNEE ARTHROSCOPY WITH PARTIAL MEDIAL MENISECTOMY, CHONDROPLASTY (Left) as a surgical intervention.  The patient's history has been reviewed, patient examined, no change in status, stable for surgery.  I have reviewed the patient's chart and labs.  Questions were answered to the patient's satisfaction.     Mauri Pole

## 2021-07-12 NOTE — Anesthesia Procedure Notes (Signed)
Procedure Name: LMA Insertion Date/Time: 07/12/2021 12:49 PM Performed by: Gerald Leitz, CRNA Pre-anesthesia Checklist: Patient identified, Patient being monitored, Timeout performed, Emergency Drugs available and Suction available Patient Re-evaluated:Patient Re-evaluated prior to induction Oxygen Delivery Method: Circle system utilized Preoxygenation: Pre-oxygenation with 100% oxygen Induction Type: IV induction Ventilation: Mask ventilation without difficulty LMA: LMA inserted LMA Size: 4.0 Tube type: Oral Number of attempts: 1 Placement Confirmation: positive ETCO2 and breath sounds checked- equal and bilateral Tube secured with: Tape Dental Injury: Teeth and Oropharynx as per pre-operative assessment

## 2021-07-12 NOTE — Progress Notes (Signed)
Assisted Dr. Andres Shad with Left Knee Adductor Canal block. Side rails up, monitors on throughout procedure. See vital signs in flow sheet. Tolerated Procedure well.

## 2021-07-12 NOTE — Discharge Instructions (Signed)
INSTRUCTIONS AFTER SURGERY  Remove items at home which could result in a fall. This includes throw rugs or furniture in walking pathways ICE to the affected joint every three hours while awake for 30 minutes at a time, for at least the first 3-5 days, and then as needed for pain and swelling.  Continue to use ice for pain and swelling. You may notice swelling that will progress down to the foot and ankle.  This is normal after surgery.  Elevate your leg when you are not up walking on it.   Continue to use the breathing machine you got in the hospital (incentive spirometer) which will help keep your temperature down.  It is common for your temperature to cycle up and down following surgery, especially at night when you are not up moving around and exerting yourself.  The breathing machine keeps your lungs expanded and your temperature down.   DIET:  As you were doing prior to hospitalization, we recommend a well-balanced diet.  DRESSING / WOUND CARE / SHOWERING  You may change your dressing 3-5 days after surgery.  Then change the dressing every day with sterile gauze.  Please use good hand washing techniques before changing the dressing.  Do not use any lotions or creams on the incision until instructed by your surgeon.  ACTIVITY  Increase activity slowly as tolerated, but follow the weight bearing instructions below.   No driving for 6 weeks or until further direction given by your physician.  You cannot drive while taking narcotics.  No lifting or carrying greater than 10 lbs. until further directed by your surgeon. Avoid periods of inactivity such as sitting longer than an hour when not asleep. This helps prevent blood clots.  You may return to work once you are authorized by your doctor.     WEIGHT BEARING   Weight bearing as tolerated with assist device (walker, cane, etc) as directed, use it as long as suggested by your surgeon or therapist, typically at least 4-6  weeks.  CONSTIPATION  Constipation is defined medically as fewer than three stools per week and severe constipation as less than one stool per week.  Even if you have a regular bowel pattern at home, your normal regimen is likely to be disrupted due to multiple reasons following surgery.  Combination of anesthesia, postoperative narcotics, change in appetite and fluid intake all can affect your bowels.   YOU MUST use at least one of the following options; they are listed in order of increasing strength to get the job done.  They are all available over the counter, and you may need to use some, POSSIBLY even all of these options:    Drink plenty of fluids (prune juice may be helpful) and high fiber foods Colace 100 mg by mouth twice a day  Senokot for constipation as directed and as needed Dulcolax (bisacodyl), take with full glass of water  Miralax (polyethylene glycol) once or twice a day as needed.  If you have tried all these things and are unable to have a bowel movement in the first 3-4 days after surgery call either your surgeon or your primary doctor.    If you experience loose stools or diarrhea, hold the medications until you stool forms back up.  If your symptoms do not get better within 1 week or if they get worse, check with your doctor.  If you experience "the worst abdominal pain ever" or develop nausea or vomiting, please contact the office immediately for  further recommendations for treatment.   ITCHING:  If you experience itching with your medications, try taking only a single pain pill, or even half a pain pill at a time.  You can also use Benadryl over the counter for itching or also to help with sleep.   TED HOSE STOCKINGS:  Use stockings on both legs until for at least 2 weeks or as directed by physician office. They may be removed at night for sleeping.  MEDICATIONS:  See your medication summary on the After Visit Summary that nursing will review with you.  You may have  some home medications which will be placed on hold until you complete the course of blood thinner medication.  It is important for you to complete the blood thinner medication as prescribed.  PRECAUTIONS:  If you experience chest pain or shortness of breath - call 911 immediately for transfer to the hospital emergency department.   If you develop a fever greater that 101 F, purulent drainage from wound, increased redness or drainage from wound, foul odor from the wound/dressing, or calf pain - CONTACT YOUR SURGEON.                                                   FOLLOW-UP APPOINTMENTS:  If you do not already have a post-op appointment, please call the office for an appointment to be seen by your surgeon.  Guidelines for how soon to be seen are listed in your After Visit Summary, but are typically between 1-4 weeks after surgery.  POST-OPERATIVE OPIOID TAPER INSTRUCTIONS: It is important to wean off of your opioid medication as soon as possible. If you do not need pain medication after your surgery it is ok to stop day one. Opioids include: Codeine, Hydrocodone(Norco, Vicodin), Oxycodone(Percocet, oxycontin) and hydromorphone amongst others.  Long term and even short term use of opiods can cause: Increased pain response Dependence Constipation Depression Respiratory depression And more.  Withdrawal symptoms can include Flu like symptoms Nausea, vomiting And more Techniques to manage these symptoms Hydrate well Eat regular healthy meals Stay active Use relaxation techniques(deep breathing, meditating, yoga) Do Not substitute Alcohol to help with tapering If you have been on opioids for less than two weeks and do not have pain than it is ok to stop all together.  Plan to wean off of opioids This plan should start within one week post op of your joint replacement. Maintain the same interval or time between taking each dose and first decrease the dose.  Cut the total daily intake of  opioids by one tablet each day Next start to increase the time between doses. The last dose that should be eliminated is the evening dose.   MAKE SURE YOU:  Understand these instructions.  Get help right away if you are not doing well or get worse.    Thank you for letting us be a part of your medical care team.  It is a privilege we respect greatly.  We hope these instructions will help you stay on track for a fast and full recovery!

## 2021-07-12 NOTE — H&P (Signed)
Knee Arthroscopy H&P  Patient is being admitted for left knee arthroscopy with partial medial menisectomy and chondroplasty  Subjective:  Chief Complaint:left knee pain.  HPI: [name], 71 y.o. female, has a history of pain and functional disability in the left knee due to  medial meniscal tear  and has failed non-surgical conservative treatments for greater than 12 weeks to includeNSAID's and/or analgesics, corticosteriod injections, and activity modification.  Onset of symptoms was gradual, starting 2 years ago with gradually worsening course since that time. The patient noted no past surgery on the left knee(s).  Patient currently rates pain in the left knee(s) at 8 out of 10 with activity. Patient has worsening of pain with activity and weight bearing, pain that interferes with activities of daily living, and pain with passive range of motion.  Patient has evidence of  medial meniscal tear and chondromalacia  by imaging studies. There is no active infection.  Patient Active Problem List   Diagnosis Date Noted   Lung nodule  11/24/2020   DOE (dyspnea on exertion) 07/24/2020   Primary osteoarthritis of both hands 01/28/2018   Primary osteoarthritis of both knees 01/28/2018   DDD (degenerative disc disease), lumbar 01/28/2018   History of bladder cancer 01/28/2018   NSTEMI (non-ST elevated myocardial infarction) ([place]) 02/18/2017   Rectal spasm 04/11/2015   Rectal bleeding 03/23/2013   Reflux esophagitis 04/30/2012   Other dysphagia 04/30/2012   Syncope 04/20/2012   Cerebral artery occlusion with cerebral infarction ([place]) 01/17/2010   FREQUENCY, URINARY 01/17/2010   Hyperlipidemia 08/05/2007   Essential hypertension 08/05/2007   Coronary artery disease involving native coronary artery of native heart with angina pectoris ([place]) 08/05/2007   GERD (gastroesophageal reflux disease) 08/05/2007   Irritable bowel syndrome 08/05/2007   PROCTALGIA FUGAX 08/05/2007   Past Medical  History:  Diagnosis Date   Anxiety    Psychiatry Dr. Stan Head, concerns for memory loss, anxiety   Broken heart syndrome    Cancer ([place])    bladder CA   Coronary atherosclerosis CARDIOLOGIST-  DR Daneen Schick   Cath 11/2006 demonstrating moderate obstructive coronary disease with 70-80% septal perforator #1, 50-70% first diagonal, 80% third diagonal, 50% mid and distal LAD & heavy calcification throughout all 3 coronaries. LVF normal.   Essential hypertension, benign    Fatty liver 04/28/2012   GERD (gastroesophageal reflux disease)    Heart murmur    History of esophageal spasm    takes ranexa   History of TIA (transient ischemic attack)    2011--  no residuals   Hyperlipidemia    IBS (irritable bowel syndrome)    Diarrhea predominent, esophageal spasm severe, GERD - Dr. Elicia Lamp   Insomnia    Interstitial cystitis    Dr. Gaynelle Arabian   Memory loss    Psychiatry Dr. Stan Head, concerns for memory loss, anxiety   PONV (postoperative nausea and vomiting)    AND URINARY RETENTION   TMJ (temporomandibular joint syndrome)    Wears glasses     Past Surgical History:  Procedure Laterality Date   ABDOMINAL HYSTERECTOMY  AGE 40 -- [place]   AND OVARIAN CYSTECTOMY   BENIGN EXCISIONAL BREAST BX  1996   BREAST EXCISIONAL BIOPSY Right 1992   CARDIAC CATHETERIZATION  11-28-2006   DR Mallie Mussel SMITH    moderate cad/  70-80% septal perforator #1, 50-70% first diagonal, 80% third diagonal, 50% mid and distal LAD & heavy calcification throughout all 3 coronaries. LVF normal.  CARDIAC CATHETERIZATION  03-26-2012   DR Daneen Schick   patent CFX,  RCA,  & pLAD/  mLAD >50% to <70% a focal eccentric region that overlaps the third diagonal/  90% diagonal ostial and unchanged from prior study /   ef 65%   CARPAL TUNNEL RELEASE Bilateral    CYSTO WITH HYDRODISTENSION N/A 01/14/2014   Procedure: CYSTOSCOPY/HYDRODISTENSION/INSERTION OF MARCAINE AND PYRIDUIM INTO BLADDER/INJECTION OF  MARCAINE AND KENALOG into sub trigone space ;  Surgeon: Ailene Rud, MD;  Location: [facility];  Service: Urology;  Laterality: N/A;   CYSTO/  HYDRODISTENTION/  INSTILLATION THERAPY  03/12/2010   ESOPHAGOGASTRODUODENOSCOPY  04/30/2012   Procedure: ESOPHAGOGASTRODUODENOSCOPY (EGD);  Surgeon: Lafayette Dragon, MD;  Location: [facility];  Service: Endoscopy;  Laterality: N/A;   EYE SURGERY Bilateral 2022   LEFT HEART CATH AND CORONARY ANGIOGRAPHY N/A 02/18/2017   Procedure: LEFT HEART CATH AND CORONARY ANGIOGRAPHY;  Surgeon: Belva Crome, MD;  Location: [facility];  Service: Cardiovascular;  Laterality: N/A;   LUMBAR Deal ARTHROSCOPY Left 12/23/2011   SHOULDER SURGERY Right 2010   TRANSTHORACIC ECHOCARDIOGRAM  04/21/2012   GRADE I DIASTOLIC DYSFUNCTION/  EF 60-65%/  MILD MR  &  TR   TRANSURETHRAL RESECTION OF BLADDER TUMOR Right 01/03/2015   Procedure: TRANSURETHRAL RESECTION OF BLADDER TUMOR (TURBT);  Surgeon: Carolan Clines, MD;  Location: [facility];  Service: Urology;  Laterality: Right;   TRIGGER FINGER RELEASE Bilateral 2017    No current facility-administered medications for this encounter.   Current Outpatient Medications  Medication Sig Dispense Refill Last Dose   ARTIFICIAL TEAR SOLUTION OP Place 1 drop into both eyes 2 (two) times daily.      aspirin 81 MG tablet Take 81 mg by mouth at bedtime.       atorvastatin (LIPITOR) 40 MG tablet Take 1 tablet (40 mg total) by mouth daily. 90 tablet 3    Cholecalciferol (VITAMIN D) 50 MCG (2000 UT) tablet Take 2,000 Units by mouth daily.      cyclobenzaprine (FLEXERIL) 10 MG tablet Take 10 mg by mouth daily as needed for muscle spasms.      dicyclomine (BENTYL) 10 MG capsule TAKE 1 CAPSULE(10 MG) BY MOUTH TWICE DAILY AS NEEDED FOR SPASMS (Patient taking differently: Take 10 mg by mouth See admin instructions. Take 10 mg in the morning may take a second 10 mg dose as  needed for spasms) 180 capsule 0    Digestive Enzymes (MULTI-ENZYME) TABS Take 1 tablet by mouth daily as needed (acidic food/beans/broccoli).      diltiazem 2 % GEL Apply 1 application topically as needed. For rectal spasm 30 g 1    diphenhydrAMINE (BENADRYL) 25 MG tablet Take 25 mg by mouth every 6 (six) hours as needed.      ezetimibe (ZETIA) 10 MG tablet Take 1 tablet (10 mg total) by mouth daily. 90 tablet 3    hyoscyamine (LEVSIN SL) 0.125 MG SL tablet Take 1 tablet (0.125 mg total) by mouth daily as needed. Please schedule office visit. 90 tablet 3    ibuprofen (ADVIL,MOTRIN) 200 MG tablet Take 600 mg by mouth every 6 (six) hours as needed for headache or moderate pain.      linaclotide (LINZESS) 145 MCG CAPS capsule Take 1 capsule (145 mcg total) by mouth daily before breakfast. (Patient taking differently: Take 145 mcg by mouth daily as needed (constipation).) 30 capsule 3  nebivolol (BYSTOLIC) 2.5 MG tablet TAKE 1 TABLET EVERY DAY 90 tablet 3    NEXIUM 40 MG capsule Take 1 capsule (40 mg total) by mouth daily at 2 am. BRAND NAME ONLY (Patient taking differently: Take 40 mg by mouth daily. BRAND NAME ONLY) 180 capsule 3    nitroGLYCERIN (NITROSTAT) 0.4 MG SL tablet place 1 tablet under the tongue if needed every 5 minutes for chest pain for 3 doses IF NO RELIEF AFTER 3RD DOSE CALL PRESCRIBER OR 911. 75 tablet 1    Peppermint Oil 90 MG CPCR Take 2 capsules by mouth as needed (indigestion).      Probiotic Product (PROBIOTIC PO) Take 1 capsule by mouth daily.      QUEtiapine (SEROQUEL) 25 MG tablet Take 1 tablet (25 mg total) by mouth at bedtime. 90 tablet 3    ranolazine (RANEXA) 500 MG 12 hr tablet TAKE 1 TABLET TWICE DAILY 180 tablet 3    sodium chloride (MURO 128) 5 % ophthalmic solution Place 1 drop into both eyes 3 (three) times a week.      traMADol (ULTRAM) 50 MG tablet Take 50 mg by mouth every 6 (six) hours as needed.      famotidine (PEPCID) 20 MG tablet Take 1 tablet (20 mg  total) by mouth at bedtime. (Patient not taking: Reported on 07/04/2021) 90 tablet 3 Not Taking   famotidine (PEPCID) 40 MG tablet Take 40 mg by mouth daily.      hydrOXYzine (ATARAX) 25 MG tablet Take 1 tablet (25 mg total) by mouth at bedtime as needed. 30 tablet 0    sucralfate (CARAFATE) 1 g tablet Take 1 tablet (1 g total) by mouth 3 (three) times daily. As needed (Patient not taking: Reported on 07/04/2021) 90 tablet 3 Not Taking   Allergies  Allergen Reactions   Clindamycin/Lincomycin Other (See Comments)    Pt not sure of reaction    Doxycycline Nausea And Vomiting   Escitalopram Oxalate Nausea Only   Levsinex Timecaps [Hyoscyamine] Itching   Macrodantin Other (See Comments)    Flushed, hot   Sulfa Antibiotics     Unknown reaction    Trazodone Nausea Only   Trazodone And Nefazodone Nausea Only   Trimethoprim Other (See Comments)    unknown    Epinephrine Palpitations   Lincomycin Rash   Nitrofurantoin Macrocrystal Anxiety and Rash    Flushing, hot face and ears    Social History   Tobacco Use   Smoking status: Former    Packs/day: 1.00    Years: 10.00    Pack years: 10.00    Types: Cigarettes    Quit date: 06/10/1973    Years since quitting: 48.1   Smokeless tobacco: Never  Substance Use Topics   Alcohol use: Yes    Alcohol/week: 2.0 standard drinks    Types: 2 Glasses of wine per week    Comment: social    Family History  Problem Relation Age of Onset   Heart disease Mother    Colon polyps Mother    Alzheimer's disease Mother    Dementia Mother    CAD Mother    Heart disease Father    Diverticulosis Father    CAD Father        in his 8s   Heart disease Brother    Osteoarthritis Other        Multiple family members   Obesity Other        Multiple family members   Depression Other  Multiple family members   Breast cancer Sister 87       Pam   Colon cancer Neg Hx    Esophageal cancer Neg Hx    Stomach cancer Neg Hx    Rectal cancer Neg Hx       Review of Systems  Constitutional:  Negative for chills and fever.  Respiratory:  Negative for cough and shortness of breath.   Cardiovascular:  Negative for chest pain.  Gastrointestinal:  Negative for nausea and vomiting.  Musculoskeletal:  Positive for arthralgias.    Objective:  Physical Exam Pleasant and healthy 71 year old female awake alert and oriented. She is in no acute distress. She walks without assist device. She does have a mild limp favoring the left knee  Left knee exam: No palpable effusion, warmth erythema Full knee extension and flexion Tenderness at the medial joint line Stable medial lateral collateral ligaments No lower extremity edema or erythema   Vital signs in last 24 hours:    Labs:   Estimated body mass index is 22.68 kg/m as calculated from the following:   Height as of 07/09/21: 5' 2.5" (1.588 m).   Weight as of 07/09/21: 57.2 kg.   Imaging Review Imaging: Today we reviewed plain radiographs in addition to her MRI of her left knee which was performed on 04/21/2021. Plain radiographs reveal relative preservation of joint spaces in all 3 compartments. Based on the persistence of her symptoms despite attempted conservative treatment MRI was performed. The MRI indicates radial tear to the medial meniscus with associated subchondral reactive edema. Mild tricompartmental degenerative changes noted otherwise.      Assessment/Plan:  Medial meniscal tear, left knee   The patient history, physical examination, clinical judgment of the provider and imaging studies are consistent with medial meniscal tear of the left knee(s) and left knee arthroscopy is deemed medically necessary. The treatment options including medical management, injection therapy arthroscopy and arthroplasty were discussed at length. The risks and benefits of arthroscopy were presented and reviewed. The risks due to infection, stiffness, thromboembolic complications and other  imponderables were discussed. The patient acknowledged the explanation, agreed to proceed with the plan and consent was signed. Patient is being admitted for inpatient treatment for surgery, pain control, PT, OT, prophylactic antibiotics, VTE prophylaxis, progressive ambulation and ADL's and discharge planning. The patient is planning to be discharged  home.    Costella Hatcher, PA-C Orthopedic Surgery [facility] [place] [phone]

## 2021-07-13 ENCOUNTER — Encounter (HOSPITAL_COMMUNITY): Payer: Self-pay | Admitting: Orthopedic Surgery

## 2021-07-13 MED ORDER — ROPIVACAINE HCL 5 MG/ML IJ SOLN
INTRAMUSCULAR | Status: DC | PRN
  Administered 2021-07-12: 30 mL via PERINEURAL

## 2021-07-13 MED ORDER — CLONIDINE HCL (ANALGESIA) 100 MCG/ML EP SOLN
EPIDURAL | Status: DC | PRN
  Administered 2021-07-12: 100 ug

## 2021-07-13 NOTE — Anesthesia Procedure Notes (Signed)
°  Anesthesia Regional Block: Adductor canal block   Pre-Anesthetic Checklist: , timeout performed,  Correct Patient, Correct Site, Correct Laterality,  Correct Procedure, Correct Position, site marked,  Risks and benefits discussed,  Surgical consent,  Pre-op evaluation,  At surgeon's request and post-op pain management  Laterality: Lower  Prep: chloraprep       Needles:  Injection technique: Single-shot  Needle Type: Echogenic Needle     Needle Length: 9cm  Needle Gauge: 22     Additional Needles:   Procedures:,,,, ultrasound used (permanent image in chart),,    Narrative:  Start time: 07/12/2021 12:25 PM End time: 07/12/2021 12:29 PM Injection made incrementally with aspirations every 5 mL.  Performed by: Personally  Anesthesiologist: Barnet Glasgow, MD  Additional Notes: Block assessed prior to surgery. Pt tolerated procedure well.

## 2021-07-19 ENCOUNTER — Telehealth: Payer: Self-pay | Admitting: *Deleted

## 2021-07-19 DIAGNOSIS — Z9189 Other specified personal risk factors, not elsewhere classified: Secondary | ICD-10-CM | POA: Diagnosis not present

## 2021-07-19 DIAGNOSIS — C674 Malignant neoplasm of posterior wall of bladder: Secondary | ICD-10-CM | POA: Diagnosis not present

## 2021-07-19 NOTE — Telephone Encounter (Signed)
° °  Pre-operative Risk Assessment    Patient Name: [name]  DOB: [birth year] MRN: [id number]      Request for Surgical Clearance    Procedure:   B/L UPPER EYELID BLEPHAROPLASTY  Date of Surgery:  Clearance 08/06/21                            Surgeon:  DR. Di Kindle Surgeon's Group or Practice Name:  [facility] Phone number:  [phone] Fax number:  [phone]   Type of Clearance Requested:   - Medical  - Pharmacy:  Hold Aspirin     Type of Anesthesia:  MAC   Additional requests/questions:    Jiles Prows   07/19/2021, 3:05 PM

## 2021-07-19 NOTE — Telephone Encounter (Signed)
Call placed and attempted to contact patient regarding surgical clearance for upcoming blepharoplasty surgery.  Patient was unavailable instructed to call back at earliest convenience.

## 2021-07-20 NOTE — Telephone Encounter (Signed)
° °  Name: ADYSSON REVELLE  DOB: April 13, 1951  MRN: [id number]   Primary Cardiologist: Sinclair Grooms, MD  Chart reviewed as part of pre-operative protocol coverage. Patient was contacted 07/20/2021 in reference to pre-operative risk assessment for pending surgery as outlined below.  ASENATH BALASH was last seen on 03/08/2021 by Dr. Daneen Schick.  Since that day, SAVANAH BAYLES has done well without exertional chest pain or worsening dyspnea.  Patient recently underwent knee surgery without any complication.  Eyelid surgery is a low risk procedure.  She may hold aspirin for 5 to 7 days prior to the procedure and restart as soon as possible afterward at the surgeon's discretion.  Therefore, based on ACC/AHA guidelines, the patient would be at acceptable risk for the planned procedure without further cardiovascular testing.   The patient was advised that if she develops new symptoms prior to surgery to contact our office to arrange for a follow-up visit, and she verbalized understanding.  I will route this recommendation to the requesting party via Epic fax function and remove from pre-op pool. Please call with questions.  [place], [place] 07/20/2021, 12:24 PM

## 2021-08-06 DIAGNOSIS — H02831 Dermatochalasis of right upper eyelid: Secondary | ICD-10-CM | POA: Diagnosis not present

## 2021-08-06 DIAGNOSIS — H02413 Mechanical ptosis of bilateral eyelids: Secondary | ICD-10-CM | POA: Diagnosis not present

## 2021-08-06 DIAGNOSIS — H02834 Dermatochalasis of left upper eyelid: Secondary | ICD-10-CM | POA: Diagnosis not present

## 2021-08-06 DIAGNOSIS — H53453 Other localized visual field defect, bilateral: Secondary | ICD-10-CM | POA: Diagnosis not present

## 2021-08-24 ENCOUNTER — Encounter: Payer: Self-pay | Admitting: Gastroenterology

## 2021-09-05 ENCOUNTER — Encounter: Payer: Self-pay | Admitting: Gastroenterology

## 2021-09-06 ENCOUNTER — Telehealth: Payer: Self-pay | Admitting: Gastroenterology

## 2021-09-06 NOTE — Telephone Encounter (Signed)
Returned [employer]. Prior authorization for twice a day brand name Nexium done over the phone with representative. Waiting on decision will send patient a message back through pt advise request that she sent to [place].   ?

## 2021-09-06 NOTE — Telephone Encounter (Signed)
[place] from [place] called wanting to speak with you about patient's Nexium prescription.  You may reach her at [phone], reference #[id number].  Thank you. ?

## 2021-09-07 ENCOUNTER — Other Ambulatory Visit: Payer: Self-pay

## 2021-09-07 MED ORDER — NEXIUM 40 MG PO CPDR: DELAYED_RELEASE_CAPSULE | ORAL | 3 refills | Status: DC

## 2021-09-07 NOTE — Telephone Encounter (Signed)
Nexium Brand name approved for twice a day patient informed through pt advise request  ?

## 2021-09-07 NOTE — Telephone Encounter (Signed)
Please send new Rx for Nexium. Thanks ? ?

## 2021-09-07 NOTE — Telephone Encounter (Signed)
Patient sent a message through My Chart. Will make Dr Silverio Decamp aware of the decrease in her dosing of Nexium and apparent misunderstanding at the last visit when verifying her medications. ?

## 2021-09-07 NOTE — Telephone Encounter (Signed)
Patient called back about the Nexium prescription.  She wants the prescription to correctly reflect what she's taking.  She does not want to request half a prescription, nor does she want to pay double for 2 pills a day when she only takes one.  (See note from [facility]).  Please call patient and advise.  Thank you. ?

## 2021-09-11 ENCOUNTER — Encounter: Payer: Self-pay | Admitting: Gastroenterology

## 2021-09-12 ENCOUNTER — Other Ambulatory Visit: Payer: Self-pay

## 2021-09-13 ENCOUNTER — Other Ambulatory Visit: Payer: Self-pay | Admitting: *Deleted

## 2021-09-13 MED ORDER — NEXIUM 40 MG PO CPDR: DELAYED_RELEASE_CAPSULE | ORAL | 3 refills | Status: AC

## 2021-09-19 DIAGNOSIS — M25512 Pain in left shoulder: Secondary | ICD-10-CM | POA: Diagnosis not present

## 2021-09-19 DIAGNOSIS — M7552 Bursitis of left shoulder: Secondary | ICD-10-CM | POA: Diagnosis not present

## 2021-09-19 DIAGNOSIS — M25511 Pain in right shoulder: Secondary | ICD-10-CM | POA: Diagnosis not present

## 2021-09-26 DIAGNOSIS — M13841 Other specified arthritis, right hand: Secondary | ICD-10-CM | POA: Diagnosis not present

## 2021-09-26 DIAGNOSIS — M79644 Pain in right finger(s): Secondary | ICD-10-CM | POA: Diagnosis not present

## 2021-09-26 DIAGNOSIS — M13849 Other specified arthritis, unspecified hand: Secondary | ICD-10-CM | POA: Diagnosis not present

## 2021-09-26 DIAGNOSIS — M25532 Pain in left wrist: Secondary | ICD-10-CM | POA: Diagnosis not present

## 2021-10-03 DIAGNOSIS — M25512 Pain in left shoulder: Secondary | ICD-10-CM | POA: Diagnosis not present

## 2021-10-06 NOTE — Progress Notes (Deleted)
Cardiology Office Note:    Date:  10/06/2021   ID:  KEYMIAH LYLES, DOB 01/15/1951, MRN [id number]  PCP:  Scheryl Marten, [place]  Cardiologist:  Sinclair Grooms, MD   Referring MD: Sabra Heck, [place], [place]   No chief complaint on file.   History of Present Illness:    [name] is a 71 y.o. female with a hx of hyperlipidemia, severe diffuse CAD, intermittent exertional angina, and anxiety disorder.     ***  Past Medical History:  Diagnosis Date   Anxiety    Psychiatry Dr. Stan Head, concerns for memory loss, anxiety   Broken heart syndrome    Cancer ([place])    bladder CA   Coronary atherosclerosis CARDIOLOGIST-  DR Daneen Schick   Cath 11/2006 demonstrating moderate obstructive coronary disease with 70-80% septal perforator #1, 50-70% first diagonal, 80% third diagonal, 50% mid and distal LAD & heavy calcification throughout all 3 coronaries. LVF normal.   Essential hypertension, benign    Fatty liver 04/28/2012   GERD (gastroesophageal reflux disease)    Heart murmur    History of esophageal spasm    takes ranexa   History of TIA (transient ischemic attack)    2011--  no residuals   Hyperlipidemia    IBS (irritable bowel syndrome)    Diarrhea predominent, esophageal spasm severe, GERD - Dr. Elicia Lamp   Insomnia    Interstitial cystitis    Dr. Gaynelle Arabian   Memory loss    Psychiatry Dr. Stan Head, concerns for memory loss, anxiety   PONV (postoperative nausea and vomiting)    AND URINARY RETENTION   TMJ (temporomandibular joint syndrome)    Wears glasses     Past Surgical History:  Procedure Laterality Date   ABDOMINAL HYSTERECTOMY  AGE 14 -- [place]   AND OVARIAN CYSTECTOMY   BENIGN EXCISIONAL BREAST BX  1996   BREAST EXCISIONAL BIOPSY Right 1992   CARDIAC CATHETERIZATION  11-28-2006   DR Mallie Mussel Nalee Lightle    moderate cad/  70-80% septal perforator #1, 50-70% first diagonal, 80% third diagonal, 50% mid and distal LAD & heavy  calcification throughout all 3 coronaries. LVF normal.   CARDIAC CATHETERIZATION  03-26-2012   DR Daneen Schick   patent CFX,  RCA,  & pLAD/  mLAD >50% to <70% a focal eccentric region that overlaps the third diagonal/  90% diagonal ostial and unchanged from prior study /   ef 65%   CARPAL TUNNEL RELEASE Bilateral    CYSTO WITH HYDRODISTENSION N/A 01/14/2014   Procedure: CYSTOSCOPY/HYDRODISTENSION/INSERTION OF MARCAINE AND PYRIDUIM INTO BLADDER/INJECTION OF MARCAINE AND KENALOG into sub trigone space ;  Surgeon: Ailene Rud, MD;  Location: [facility];  Service: Urology;  Laterality: N/A;   CYSTO/  HYDRODISTENTION/  INSTILLATION THERAPY  03/12/2010   ESOPHAGOGASTRODUODENOSCOPY  04/30/2012   Procedure: ESOPHAGOGASTRODUODENOSCOPY (EGD);  Surgeon: Lafayette Dragon, MD;  Location: [facility];  Service: Endoscopy;  Laterality: N/A;   EYE SURGERY Bilateral 2022   KNEE ARTHROSCOPY WITH MEDIAL MENISECTOMY Left 07/12/2021   Procedure: KNEE ARTHROSCOPY WITH PARTIAL MEDIAL MENISECTOMY, CHONDROPLASTY;  Surgeon: Paralee Cancel, MD;  Location: [facility];  Service: Orthopedics;  Laterality: Left;   LEFT HEART CATH AND CORONARY ANGIOGRAPHY N/A 02/18/2017   Procedure: LEFT HEART CATH AND CORONARY ANGIOGRAPHY;  Surgeon: Belva Crome, MD;  Location: [facility];  Service: Cardiovascular;  Laterality: N/A;   [place]  ARTHROSCOPY Left 12/23/2011   SHOULDER SURGERY Right 2010   TRANSTHORACIC ECHOCARDIOGRAM  04/21/2012   GRADE I DIASTOLIC DYSFUNCTION/  EF 60-65%/  MILD MR  &  TR   TRANSURETHRAL RESECTION OF BLADDER TUMOR Right 01/03/2015   Procedure: TRANSURETHRAL RESECTION OF BLADDER TUMOR (TURBT);  Surgeon: Carolan Clines, MD;  [facility];  Service: Urology;  Laterality: Right;   TRIGGER FINGER RELEASE Bilateral 2017    Current Medications: No outpatient medications have been marked as taking for the 10/08/21 encounter (Appointment)  with Belva Crome, MD.     Allergies:   Clindamycin/lincomycin, Doxycycline, Escitalopram oxalate, Levsinex timecaps [hyoscyamine], Macrodantin, Sulfa antibiotics, Trazodone, Trazodone and nefazodone, Trimethoprim, Epinephrine, Lincomycin, and Nitrofurantoin macrocrystal   Social History   Socioeconomic History   Marital status: Married    Spouse name: Not on file   Number of children: 2   Years of education: Not on file   Highest education level: Not on file  Occupational History   Occupation: caregiver  Tobacco Use   Smoking status: Former    Packs/day: 1.00    Years: 10.00    Pack years: 10.00    Types: Cigarettes    Quit date: 06/10/1973    Years since quitting: 48.3   Smokeless tobacco: Never  Vaping Use   Vaping Use: Never used  Substance and Sexual Activity   Alcohol use: Yes    Alcohol/week: 2.0 standard drinks    Types: 2 Glasses of wine per week    Comment: social   Drug use: No   Sexual activity: Yes  Other Topics Concern   Not on file  Social History Narrative   Not on file   Social Determinants of Health   Financial Resource Strain: Not on file  Food Insecurity: Not on file  Transportation Needs: Not on file  Physical Activity: Not on file  Stress: Not on file  Social Connections: Not on file     Family History: The patient's family history includes Alzheimer's disease in her mother; Breast cancer (age of onset: 52) in her sister; CAD in her father and mother; Colon polyps in her mother; Dementia in her mother; Depression in an other family member; Diverticulosis in her father; Heart disease in her brother, father, and mother; Obesity in an other family member; Osteoarthritis in an other family member. There is no history of Colon cancer, Esophageal cancer, Stomach cancer, or Rectal cancer.  ROS:   Please see the history of present illness.    *** All other systems reviewed and are negative.  EKGs/Labs/Other Studies Reviewed:    The following  studies were reviewed today: ***  EKG:  EKG ***  Recent Labs: 07/09/2021: ALT 21; BUN 11; Creatinine, Ser 0.61; Hemoglobin 12.4; Platelets 297; Potassium 4.3; Sodium 138  Recent Lipid Panel    Component Value Date/Time   CHOL 118 11/03/2018 1029   TRIG 90 11/03/2018 1029   HDL 50 11/03/2018 1029   CHOLHDL 2.4 11/03/2018 1029   CHOLHDL 2.4 02/18/2017 0824   VLDL 17 02/18/2017 0824   LDLCALC 50 11/03/2018 1029    Physical Exam:    VS:  There were no vitals taken for this visit.    Wt Readings from Last 3 Encounters:  07/09/21 126 lb (57.2 kg)  04/20/21 124 lb 6 oz (56.4 kg)  04/10/21 124 lb 12.8 oz (56.6 kg)     GEN: ***. No acute distress HEENT: Normal NECK: No JVD. LYMPHATICS: No lymphadenopathy CARDIAC: *** murmur. RRR ***  gallop, or edema. VASCULAR: *** Normal Pulses. No bruits. RESPIRATORY:  Clear to auscultation without rales, wheezing or rhonchi  ABDOMEN: Soft, non-tender, non-distended, No pulsatile mass, MUSCULOSKELETAL: No deformity  SKIN: Warm and dry NEUROLOGIC:  Alert and oriented x 3 PSYCHIATRIC:  Normal affect   ASSESSMENT:    1. Coronary artery disease involving native coronary artery of native heart without angina pectoris   2. Essential hypertension   3. Other hyperlipidemia   4. Pulmonary emphysema, unspecified emphysema type ([place])   5. Cerebral artery occlusion with cerebral infarction [facility])    PLAN:    In order of problems listed above:  ***   Medication Adjustments/Labs and Tests Ordered: Current medicines are reviewed at length with the patient today.  Concerns regarding medicines are outlined above.  No orders of the defined types were placed in this encounter.  No orders of the defined types were placed in this encounter.   There are no Patient Instructions on file for this visit.   Signed, Sinclair Grooms, MD  10/06/2021 3:36 PM    [place]

## 2021-10-08 ENCOUNTER — Ambulatory Visit: Payer: Medicare Other | Admitting: Interventional Cardiology

## 2021-10-08 DIAGNOSIS — E7849 Other hyperlipidemia: Secondary | ICD-10-CM

## 2021-10-08 DIAGNOSIS — I10 Essential (primary) hypertension: Secondary | ICD-10-CM

## 2021-10-08 DIAGNOSIS — I2510 Atherosclerotic heart disease of native coronary artery without angina pectoris: Secondary | ICD-10-CM

## 2021-10-08 DIAGNOSIS — I6350 Cerebral infarction due to unspecified occlusion or stenosis of unspecified cerebral artery: Secondary | ICD-10-CM

## 2021-10-08 DIAGNOSIS — J439 Emphysema, unspecified: Secondary | ICD-10-CM

## 2021-10-10 DIAGNOSIS — Z03818 Encounter for observation for suspected exposure to other biological agents ruled out: Secondary | ICD-10-CM | POA: Diagnosis not present

## 2021-10-10 DIAGNOSIS — R051 Acute cough: Secondary | ICD-10-CM | POA: Diagnosis not present

## 2021-10-10 DIAGNOSIS — J40 Bronchitis, not specified as acute or chronic: Secondary | ICD-10-CM | POA: Diagnosis not present

## 2021-10-15 NOTE — Progress Notes (Signed)
?Cardiology Office Note:   ? ?Date:  10/17/2021  ? ?ID:  [name], DOB 09-01-1950, MRN [id number] ? ?PCP:  Scheryl Marten, PA ?  ?[facility] Providers ?Cardiologist:  Sinclair Grooms, MD    ? ?Referring MD: Scheryl Marten, PA  ? ?Chief Complaint: follow-up CAD ? ?History of Present Illness:   ? ?[name] is a very pleasant 71 y.o. female with a hx of CAD s/p NSTEMI, intermittent exertional angina stroke, hypertension, hyperlipidemia, bladder cancer, and anxiety disorder.  ? ?Cardiac catheterization 11/2006 demonstrating moderate obstructive CAD with 70-80% septal perforator #1, 50-70% first diagonal, 80% third diagonal, 50% mid and distal LAD, and heavy calcification throughout all 3 coronaries with normal LVEF.  ? 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 EF 25-35% with mildly elevated LVEDP, severe diffuse calcification of the proximal to mid left anterior descending, 30-40% proximal LAD, 60-70% mid LAD, ostial 80-90% first diagonal, with mild luminal irregularities in RCA and circumflex arteries. 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  ? ?She was last seen in our office on 03/08/21 by Dr. Tamala Julian at which time she was experiencing intermittent palpitations, palpable irregular heartbeat, and exertional fatigue. Cardiac monitor revealed NSR with occasional PACs and PVCs, with symptoms correlating with PACs and PVCs. She was advised to follow-up in 6 months.  ? ?Today, she is here alone. Recovering from respiratory illness and has lingering cough. Feeling fatigued and not able to exercise like she would like. Notes dyspnea on exertion with recent walking, does not feel like she can go at normal pace. This was occurring prior to URI. Taking Ranexa only prior to working out, has been doing this for some time - does not feel like taking it consistently gave her more stamina with working out.  Can generally go hard on the elliptical for 9-12 minutes before she has symptoms of chest discomfort. No chest discomfort with walking but now having more shortness of breath, cannot tell if it is coming from her lungs or her heart. Was prescribed an inhaler by pulmonology for use prior to exercise but has not used it. She denies edema, orthopnea, PND, presyncope, syncope, palpitations, or weakness. Is concerned about elevated LDL of 88 done by PCP on 05/24/21. Is overall a healthy eater but admits to some recent dietary indiscretion.  ? ?Past Medical History:  ?Diagnosis Date  ? Anxiety   ? Psychiatry Dr. Stan Head, concerns for memory loss, anxiety  ? Broken heart syndrome   ? Cancer [facility])   ? bladder CA  ? Coronary atherosclerosis CARDIOLOGIST-  DR Daneen Schick  ? Cath 11/2006 demonstrating moderate obstructive coronary disease with 70-80% septal perforator #1, 50-70% first diagonal, 80% third diagonal, 50% mid and distal LAD & heavy calcification throughout all 3 coronaries. LVF normal.  ? Essential hypertension, benign   ? Fatty liver 04/28/2012  ? GERD (gastroesophageal reflux disease)   ? Heart murmur   ? History of esophageal spasm   ? takes ranexa  ? History of TIA (transient ischemic attack)   ? 2011--  no residuals  ? Hyperlipidemia   ? IBS (irritable bowel syndrome)   ? Diarrhea predominent, esophageal spasm severe, GERD - Dr. Elicia Lamp  ? Insomnia   ? Interstitial cystitis   ? Dr. Gaynelle Arabian  ? Memory loss   ? Psychiatry Dr. Stan Head, concerns for memory loss, anxiety  ? PONV (postoperative nausea and vomiting)   ?  AND URINARY RETENTION  ? TMJ (temporomandibular joint syndrome)   ? Wears glasses   ? ? ?Past Surgical History:  ?Procedure Laterality Date  ? ABDOMINAL HYSTERECTOMY  AGE 71 -- 1989  ? APPENDECTOMY  1971  ? AND OVARIAN CYSTECTOMY  ? BENIGN EXCISIONAL BREAST BX  1996  ? BREAST EXCISIONAL BIOPSY Right 1992  ? CARDIAC CATHETERIZATION  11-28-2006   DR Daneen Schick  ?  moderate cad/  70-80%  septal perforator #1, 50-70% first diagonal, 80% third diagonal, 50% mid and distal LAD & heavy calcification throughout all 3 coronaries. LVF normal.  ? CARDIAC CATHETERIZATION  03-26-2012   DR Daneen Schick  ? patent CFX,  RCA,  & pLAD/  mLAD >50% to <70% a focal eccentric region that overlaps the third diagonal/  90% diagonal ostial and unchanged from prior study /   ef 65%  ? CARPAL TUNNEL RELEASE Bilateral   ? CYSTO WITH HYDRODISTENSION N/A 01/14/2014  ? Procedure: CYSTOSCOPY/HYDRODISTENSION/INSERTION OF MARCAINE AND PYRIDUIM INTO BLADDER/INJECTION OF MARCAINE AND KENALOG into sub trigone space ;  Surgeon: Ailene Rud, MD;  Location: [facility];  Service: Urology;  Laterality: N/A;  ? CYSTO/  HYDRODISTENTION/  INSTILLATION THERAPY  03/12/2010  ? ESOPHAGOGASTRODUODENOSCOPY  04/30/2012  ? Procedure: ESOPHAGOGASTRODUODENOSCOPY (EGD);  Surgeon: Lafayette Dragon, MD;  Location: [facility];  Service: Endoscopy;  Laterality: N/A;  ? EYE SURGERY Bilateral 2022  ? KNEE ARTHROSCOPY WITH MEDIAL MENISECTOMY Left 07/12/2021  ? Procedure: KNEE ARTHROSCOPY WITH PARTIAL MEDIAL MENISECTOMY, CHONDROPLASTY;  Surgeon: Paralee Cancel, MD;  Location: [facility];  Service: Orthopedics;  Laterality: Left;  ? LEFT HEART CATH AND CORONARY ANGIOGRAPHY N/A 02/18/2017  ? Procedure: LEFT HEART CATH AND CORONARY ANGIOGRAPHY;  Surgeon: Belva Crome, MD;  Location: [place] [facility];  Service: Cardiovascular;  Laterality: N/A;  ? [place]  ? SHOULDER ARTHROSCOPY Left 12/23/2011  ? SHOULDER SURGERY Right 2010  ? TRANSTHORACIC ECHOCARDIOGRAM  04/21/2012  ? GRADE I DIASTOLIC DYSFUNCTION/  EF 60-65%/  MILD MR  &  TR  ? TRANSURETHRAL RESECTION OF BLADDER TUMOR Right 01/03/2015  ? Procedure: TRANSURETHRAL RESECTION OF BLADDER TUMOR (TURBT);  Surgeon: Carolan Clines, MD;  Location: [facility];  Service: Urology;  Laterality: Right;  ? TRIGGER FINGER RELEASE Bilateral 2017  ? ? ?Current  Medications: ?Current Meds  ?Medication Sig  ? ARTIFICIAL TEAR SOLUTION OP Place 1 drop into both eyes 2 (two) times daily.  ? Cholecalciferol (VITAMIN D) 50 MCG (2000 UT) tablet Take 2,000 Units by mouth daily.  ? cyclobenzaprine (FLEXERIL) 10 MG tablet Take 10 mg by mouth daily as needed for muscle spasms.  ? dicyclomine (BENTYL) 10 MG capsule TAKE 1 CAPSULE(10 MG) BY MOUTH TWICE DAILY AS NEEDED FOR SPASMS (Patient taking differently: Take 10 mg by mouth See admin instructions. Take 10 mg in the morning may take a second 10 mg dose as needed for spasms)  ? Digestive Enzymes (MULTI-ENZYME) TABS Take 1 tablet by mouth daily as needed (acidic food/beans/broccoli).  ? diltiazem 2 % GEL Apply 1 application topically as needed. For rectal spasm  ? hydrOXYzine (ATARAX) 25 MG tablet Take 1 tablet (25 mg total) by mouth at bedtime as needed.  ? hyoscyamine (LEVSIN SL) 0.125 MG SL tablet Take 1 tablet (0.125 mg total) by mouth daily as needed. Please schedule office visit.  ? linaclotide (LINZESS) 145 MCG CAPS capsule Take 145 mcg by mouth as needed (For constipation).  ? NEXIUM 40 MG  capsule BRAND NAME ONLY- take 1 capsule daily as directed  ? nitroGLYCERIN (NITROSTAT) 0.4 MG SL tablet place 1 tablet under the tongue if needed every 5 minutes for chest pain for 3 doses IF NO RELIEF AFTER 3RD DOSE CALL PRESCRIBER OR 911.  ? Peppermint Oil 90 MG CPCR Take 2 capsules by mouth as needed (indigestion).  ? Probiotic Product (PROBIOTIC PO) Take 1 capsule by mouth daily.  ? QUEtiapine (SEROQUEL) 25 MG tablet Take 1 tablet (25 mg total) by mouth at bedtime.  ? ranolazine (RANEXA) 500 MG 12 hr tablet TAKE 1 TABLET TWICE DAILY  ? sodium chloride (MURO 128) 5 % ophthalmic solution Place 1 drop into both eyes 3 (three) times a week.  ? sucralfate (CARAFATE) 1 g tablet Take 1 tablet (1 g total) by mouth 3 (three) times daily. As needed  ? [DISCONTINUED] atorvastatin (LIPITOR) 40 MG tablet Take 1 tablet (40 mg total) by mouth daily.  ?  [DISCONTINUED] ezetimibe (ZETIA) 10 MG tablet Take 1 tablet (10 mg total) by mouth daily.  ? [DISCONTINUED] linaclotide (LINZESS) 145 MCG CAPS capsule Take 1 capsule (145 mcg total) by mouth daily before breakfast

## 2021-10-17 ENCOUNTER — Encounter: Payer: Self-pay | Admitting: Nurse Practitioner

## 2021-10-17 ENCOUNTER — Ambulatory Visit (INDEPENDENT_AMBULATORY_CARE_PROVIDER_SITE_OTHER): Payer: Medicare Other | Admitting: Nurse Practitioner

## 2021-10-17 VITALS — BP 128/58 | HR 64 | Ht 62.5 in | Wt 129.2 lb

## 2021-10-17 DIAGNOSIS — E785 Hyperlipidemia, unspecified: Secondary | ICD-10-CM | POA: Diagnosis not present

## 2021-10-17 DIAGNOSIS — I25118 Atherosclerotic heart disease of native coronary artery with other forms of angina pectoris: Secondary | ICD-10-CM | POA: Diagnosis not present

## 2021-10-17 DIAGNOSIS — I10 Essential (primary) hypertension: Secondary | ICD-10-CM | POA: Diagnosis not present

## 2021-10-17 DIAGNOSIS — R002 Palpitations: Secondary | ICD-10-CM | POA: Diagnosis not present

## 2021-10-17 DIAGNOSIS — I2510 Atherosclerotic heart disease of native coronary artery without angina pectoris: Secondary | ICD-10-CM | POA: Diagnosis not present

## 2021-10-17 LAB — LIPID PANEL
Chol/HDL Ratio: 3 ratio (ref 0.0–4.4)
Cholesterol, Total: 143 mg/dL (ref 100–199)
HDL: 48 mg/dL (ref >39)
LDL Chol Calc (NIH): 55 mg/dL (ref 0–99)
Triglycerides: 252 mg/dL — ABNORMAL HIGH (ref 0–149)
VLDL Cholesterol Cal: 40 mg/dL (ref 5–40)

## 2021-10-17 MED ORDER — ATORVASTATIN CALCIUM 40 MG PO TABS: 40.0000 mg | ORAL_TABLET | Freq: Every day | ORAL | 3 refills | Status: DC

## 2021-10-17 MED ORDER — NEBIVOLOL HCL 2.5 MG PO TABS: 2.5000 mg | ORAL_TABLET | Freq: Every day | ORAL | 3 refills | Status: DC

## 2021-10-17 MED ORDER — EZETIMIBE 10 MG PO TABS: 10.0000 mg | ORAL_TABLET | Freq: Every day | ORAL | 3 refills | Status: AC

## 2021-10-17 NOTE — Patient Instructions (Signed)
Medication Instructions:  ? ?Your physician recommends that you continue on your current medications as directed. Please refer to the Current Medication list given to you today. ? ? ?*If you need a refill on your cardiac medications before your next appointment, please call your pharmacy* ? ? ?Lab Work: ? ?TODAY!!!! LIPID ? ?If you have labs (blood work) drawn today and your tests are completely normal, you will receive your results only by: ?MyChart Message (if you have MyChart) OR ?A paper copy in the mail ?If you have any lab test that is abnormal or we need to change your treatment, we will call you to review the results. ? ? ?Testing/Procedures: ? ?Sharyn Lull will call you after she talks with Dr. Tamala Julian.  ? ? ?Follow-Up: ?At [facility], you and your health needs are our priority.  As part of our continuing mission to provide you with exceptional heart care, we have created designated Provider Care Teams.  These Care Teams include your primary Cardiologist (physician) and Advanced Practice Providers (APPs -  Physician Assistants and Nurse Practitioners) who all work together to provide you with the care you need, when you need it. ? ?We recommend signing up for the patient portal called "MyChart".  Sign up information is provided on this After Visit Summary.  MyChart is used to connect with patients for Virtual Visits (Telemedicine).  Patients are able to view lab/test results, encounter notes, upcoming appointments, etc.  Non-urgent messages can be sent to your provider as well.   ?To learn more about what you can do with MyChart, go to [email or web address].   ? ?Your next appointment:   ?Depends on future testing.     ? ?The format for your next appointment:   ?  ? ?Provider:   ?  ? ? ?Other Instructions ? ? ?Important Information About Sugar ? ? ? ? ?  ?

## 2021-10-25 ENCOUNTER — Other Ambulatory Visit: Payer: Self-pay | Admitting: Internal Medicine

## 2021-10-25 ENCOUNTER — Ambulatory Visit
Admission: RE | Admit: 2021-10-25 | Discharge: 2021-10-25 | Disposition: A | Discharge status: Home / Self Care | Payer: Medicare Other | Source: Ambulatory Visit | Attending: Internal Medicine | Admitting: Internal Medicine

## 2021-10-25 DIAGNOSIS — J40 Bronchitis, not specified as acute or chronic: Secondary | ICD-10-CM | POA: Diagnosis not present

## 2021-10-25 DIAGNOSIS — R052 Subacute cough: Secondary | ICD-10-CM

## 2021-10-25 DIAGNOSIS — R059 Cough, unspecified: Secondary | ICD-10-CM | POA: Diagnosis not present

## 2021-10-26 NOTE — Telephone Encounter (Signed)
She had seen Derl Barrow in November and I think the plan was for her to see me in 6 months, which is around now. Please make her a f/u appt with me in next month or so- ok to use held-spot.

## 2021-11-03 NOTE — Progress Notes (Unsigned)
HPI-   F former smoker followed for  Dyspnea, Lung Nodule, courtesy of Dr Mallie Mussel Smith/ Cardiology. She had complained of extreme fatigue with 30 minutes of physical actiivity, going to gym 4-5x/ week. No chest pain. Medical problem list includes CAD/ MI, CVA, HTN, Echo Grade 2 DD, GERD, IBS, Degenerative Disk Disease, Hx Bladder  Cancer, Hyperlipidemia,  PFT 03/29/20-Very Minimal obstruction, no resp to BD, Normal Lung volumes and normal DLCO Walk Test on room air 07/20/20- Very brisk walk with lowest O2 sat 99% and Max HR 95   2 laps. a1AT assay 07/20/20- MM 137 CT chest 08/09/20- No signs of emphysema. 9 mm non-solid nodule within the lateral superior segment of the left lower lobe.  ==============================================================    09/18/20-71yo  F former smoker(  passive smoke exposure to parents as a child, and she smoked some before age 60.) followed for Dyspnea, Lung Nodule, courtesy of Dr Mallie Mussel Smith/ Cardiology. She had complained of extreme fatigue with 30 minutes of physical actiivity, going to gym 4-5x/ week. No chest pain. 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 -----Requesting clarification of CT results.  She understands that CT chest shows no emphysema. There is a 9 mm LLL nodule we will recheck in 6-12 months per Radiology recommendation.  \This recommendation follows the consensus statement: Guidelines for Management of Incidental Pulmonary Nodules  Detected on CT Images: From the Fleischner Society 2017; Radiology 2017; 284:228-243. 3. Coronary artery calcifications. Aortic Atherosclerosis (ICD10-I70.0).  11/06/21- 71 yo  F former smoker(  passive smoke exposure to parents as a child, and she smoked some before age 52.) followed for Dyspnea, Lung Nodule, courtesy of Dr Mallie Mussel Smith/ Cardiology. She had complained of extreme fatigue with 30 minutes of physical actiivity, going to gym 4-5x/ week. No chest pain. Medical problem list includes CAD/ MI, CVA, HTN, Echo Grade 2 DD, GERD, IBS, Degenerative Disk Disease, Hx Bladder  Cancer, Hyperlipidemia,  -Trelegy 100 sample,     meds also include Namenda, Bystolic, Oxycodone Dr Clovis Pu follows for Insomnia - Trelegy 100,  (CTabd had described basilar emphysema, noted to be only scarring on CT chest) Covid vax-3 Phizer Flu vax-     had                        //Intend to update chest HRCT for nodule//  CXR 10/25/21- IMPRESSION: Rounded nodular area within the medial right lower lung is nonspecific and may represent overlapping structures. Underlying nodule not excluded. Recommend follow-up PA and lateral chest radiograph with improved inspiration. If the finding persists, CT would be warranted.   No acute process.  ROS-see HPI   + = positive Constitutional:    weight loss, night sweats, fevers, chills, +fatigue, lassitude. HEENT:    headaches, difficulty swallowing, tooth/dental problems, sore throat,       sneezing, itching, ear ache, nasal congestion, post nasal drip, snoring CV:    chest pain, orthopnea, PND, swelling in  lower extremities, anasarca,                                   dizziness, palpitations Resp:   +shortness of breath with exertion or at rest.                productive cough,   non-productive cough, coughing up of blood.              change in color of mucus.  wheezing.   Skin:    rash or lesions. GI:  No-   heartburn, indigestion, abdominal pain, nausea, vomiting,  diarrhea,                 change in bowel habits, loss of appetite GU: dysuria, change in color of urine, no urgency or frequency.   flank pain. MS:   joint pain, stiffness, decreased range of motion, back pain. Neuro-     nothing unusual Psych:  change in mood or affect.  depression or anxiety.   memory loss.  OBJ- Physical Exam General- Alert, Oriented, Affect-appropriate, Distress- none acute, petite, looks well Skin- rash-none, lesions- none, excoriation- none Lymphadenopathy- none Head- atraumatic            Eyes- Gross vision intact, PERRLA, conjunctivae and secretions clear            Ears- Hearing, canals-normal            Nose- Clear, no-Septal dev, mucus, polyps, erosion, perforation             Throat- Mallampati II , mucosa clear , drainage- none, tonsils- atrophic Neck- flexible , trachea midline, no stridor , thyroid nl, carotid no bruit Chest - symmetrical excursion , unlabored           Heart/CV- RRR , no murmur , no gallop  , no rub, nl s1 s2                           - JVD- none , edema- none, stasis changes- none, varices- none           Lung- clear to P&A, wheeze- none, cough- none , dullness-none, rub- none           Chest wall-  Abd-  Br/ Gen/ Rectal- Not done, not indicated Extrem- cyanosis- none, clubbing, none, atrophy- none, strength- nl Neuro- grossly intact to observation

## 2021-11-06 ENCOUNTER — Ambulatory Visit (INDEPENDENT_AMBULATORY_CARE_PROVIDER_SITE_OTHER): Payer: Medicare Other | Admitting: Internal Medicine

## 2021-11-06 ENCOUNTER — Encounter: Payer: Self-pay | Admitting: Internal Medicine

## 2021-11-06 VITALS — BP 122/62 | HR 65 | Temp 98.8°F | Ht 62.5 in | Wt 130.2 lb

## 2021-11-06 DIAGNOSIS — R911 Solitary pulmonary nodule: Secondary | ICD-10-CM

## 2021-11-06 DIAGNOSIS — I25118 Atherosclerotic heart disease of native coronary artery with other forms of angina pectoris: Secondary | ICD-10-CM | POA: Diagnosis not present

## 2021-11-06 DIAGNOSIS — J0100 Acute maxillary sinusitis, unspecified: Secondary | ICD-10-CM | POA: Diagnosis not present

## 2021-11-06 MED ORDER — CEFDINIR 300 MG PO CAPS: 300.0000 mg | ORAL_CAPSULE | Freq: Two times a day (BID) | ORAL | 0 refills | Status: AC

## 2021-11-06 MED ORDER — HYDROCOD POLI-CHLORPHE POLI ER 10-8 MG/5ML PO SUER: 5.0000 mL | Freq: Two times a day (BID) | ORAL | 0 refills | Status: DC | PRN

## 2021-11-06 NOTE — Patient Instructions (Signed)
Order- schedule HRCT chest dx lung nodules           - schedule limited CT max/fac    dx acute maxillary sinusitis  Script sent for cefdinir antibiotic  Script sent for Tussionex cough syrup  You can take an otc cough syrup like Delsym and also use sips of liquids and hard candy lozenges like  Zero Sugar Marolyn Hammock Ranchers  Please call as needed

## 2021-11-07 ENCOUNTER — Telehealth: Payer: Self-pay | Admitting: Internal Medicine

## 2021-11-07 MED ORDER — HYDROCOD POLI-CHLORPHE POLI ER 10-8 MG/5ML PO SUER: 5.0000 mL | Freq: Two times a day (BID) | ORAL | 0 refills | Status: DC | PRN

## 2021-11-07 NOTE — Telephone Encounter (Signed)
Tussionex sent to [facility] [address]

## 2021-11-07 NOTE — Telephone Encounter (Signed)
Called and notified patient. Nothing further needed.  °

## 2021-11-07 NOTE — Telephone Encounter (Signed)
Patient states cough syrup needs to be called into different pharmacy. Pharmacy is [facility]. Patient phone number is [phone].   Dr. Annamaria Boots could you please send this in for patient? Thank you!

## 2021-11-20 ENCOUNTER — Telehealth: Payer: Self-pay | Admitting: Internal Medicine

## 2021-11-20 DIAGNOSIS — H18513 Endothelial corneal dystrophy, bilateral: Secondary | ICD-10-CM | POA: Diagnosis not present

## 2021-11-23 NOTE — Telephone Encounter (Signed)
Called patient but she did not answer. Left message for her to call back. Per her chart, she has already been scheduled for the CT chest and CT sinus scans on the same day.

## 2021-11-26 ENCOUNTER — Other Ambulatory Visit: Payer: Self-pay | Admitting: Internal Medicine

## 2021-11-26 DIAGNOSIS — Z1231 Encounter for screening mammogram for malignant neoplasm of breast: Secondary | ICD-10-CM

## 2021-11-30 ENCOUNTER — Other Ambulatory Visit: Payer: Medicare Other

## 2021-12-02 ENCOUNTER — Encounter: Payer: Self-pay | Admitting: Internal Medicine

## 2021-12-02 DIAGNOSIS — J0100 Acute maxillary sinusitis, unspecified: Secondary | ICD-10-CM | POA: Insufficient documentation

## 2021-12-13 DIAGNOSIS — C674 Malignant neoplasm of posterior wall of bladder: Secondary | ICD-10-CM | POA: Diagnosis not present

## 2021-12-19 ENCOUNTER — Telehealth: Payer: Self-pay | Admitting: Internal Medicine

## 2021-12-19 DIAGNOSIS — J0100 Acute maxillary sinusitis, unspecified: Secondary | ICD-10-CM

## 2021-12-19 NOTE — Telephone Encounter (Signed)
Spoke to [name] with [facility]. She stated that patient is scheduled for CT sinus limited, however they can only do full sinus. She is questioning if order has be changed.    Dr. Annamaria Boots, please advise. Thanks

## 2021-12-20 ENCOUNTER — Encounter: Payer: Self-pay | Admitting: Gastroenterology

## 2021-12-20 ENCOUNTER — Ambulatory Visit: Payer: Medicare Other | Admitting: Internal Medicine

## 2021-12-20 ENCOUNTER — Encounter: Payer: Self-pay | Admitting: Interventional Cardiology

## 2021-12-20 MED ORDER — NITROGLYCERIN 0.4 MG SL SUBL: SUBLINGUAL_TABLET | SUBLINGUAL | 3 refills | Status: DC

## 2021-12-20 NOTE — Telephone Encounter (Signed)
Ok to change order to full sinus CT Thanks

## 2021-12-20 NOTE — Telephone Encounter (Signed)
Order has been changed.  [facility] is aware. Nothing further needed.

## 2021-12-21 ENCOUNTER — Ambulatory Visit
Admission: RE | Admit: 2021-12-21 | Discharge: 2021-12-21 | Disposition: A | Discharge status: Home / Self Care | Payer: Medicare Other | Source: Ambulatory Visit | Attending: Internal Medicine | Admitting: Internal Medicine

## 2021-12-21 DIAGNOSIS — I700 Atherosclerosis of aorta: Secondary | ICD-10-CM | POA: Diagnosis not present

## 2021-12-21 DIAGNOSIS — J0100 Acute maxillary sinusitis, unspecified: Secondary | ICD-10-CM | POA: Diagnosis not present

## 2021-12-21 DIAGNOSIS — R918 Other nonspecific abnormal finding of lung field: Secondary | ICD-10-CM | POA: Diagnosis not present

## 2021-12-21 DIAGNOSIS — R911 Solitary pulmonary nodule: Secondary | ICD-10-CM

## 2021-12-21 DIAGNOSIS — I2510 Atherosclerotic heart disease of native coronary artery without angina pectoris: Secondary | ICD-10-CM | POA: Diagnosis not present

## 2021-12-21 DIAGNOSIS — J342 Deviated nasal septum: Secondary | ICD-10-CM | POA: Diagnosis not present

## 2021-12-21 DIAGNOSIS — Z8551 Personal history of malignant neoplasm of bladder: Secondary | ICD-10-CM | POA: Diagnosis not present

## 2021-12-21 DIAGNOSIS — J3489 Other specified disorders of nose and nasal sinuses: Secondary | ICD-10-CM | POA: Diagnosis not present

## 2021-12-26 NOTE — Progress Notes (Signed)
HPI-   F former smoker followed for  Dyspnea, Lung Nodule, courtesy of Dr Mallie Mussel Smith/ Cardiology. She had complained of extreme fatigue with 30 minutes of physical actiivity, going to gym 4-5x/ week. No chest pain. Medical problem list includes CAD/ MI, CVA, HTN, Echo Grade 2 DD, GERD, IBS, Degenerative Disk Disease, Hx Bladder  Cancer, Hyperlipidemia,  PFT 03/29/20-Very Minimal obstruction, no resp to BD, Normal Lung volumes and normal DLCO Walk Test on room air 07/20/20- Very brisk walk with lowest O2 sat 99% and Max HR 95   2 laps. a1AT assay 07/20/20- MM 137 CT chest 08/09/20- No signs of emphysema. 9 mm non-solid nodule within the lateral superior segment of the left lower lobe.  =============================================================  11/06/21- 71 yo  F former smoker(  passive smoke exposure to parents as a child, and she smoked some before age 62.) followed for Dyspnea, Lung Nodule, courtesy of Dr Mallie Mussel Smith/ Cardiology. She had complained of extreme fatigue with 30 minutes of physical actiivity, going to gym 4-5x/ week. No chest pain. Medical problem list includes CAD/ MI, CVA, HTN, Echo Grade 2 DD, GERD, IBS, Degenerative Disk Disease, Hx Bladder  Cancer, Hyperlipidemia,  -Trelegy 100 sample,     meds also include Namenda, Bystolic, Oxycodone Dr Clovis Pu follows for Insomnia - Trelegy 100,  (CTabd had described basilar emphysema, noted to be only scarring on CT chest) Covid vax-3 Phizer Flu vax-     had                        //Intend to update chest HRCT for nodule// -----Patient states that she has been sick for about a month with upper/lower resp infections. Has taken several medications over the last month. Denies shortness of breath. Does have cough not coughing up anything right now. Thinks she has sinus infection. No fever, HA or GI upset. Took prednisone taper and amoxacillin. Ribs hurt with dry cough. Discussed plan to update CT chest for nodule. CXR  10/25/21- IMPRESSION: Rounded nodular area within the medial right lower lung is nonspecific and may represent overlapping structures. Underlying nodule not excluded. Recommend follow-up PA and lateral chest radiograph with improved inspiration. If the finding persists, CT would be warranted. No acute process.  12/27/21- 50 yo  F former smoker(  passive smoke exposure to parents as a child, and she smoked some before age 73.) followed for Dyspnea, Lung Nodule,CAD/ MI, CVA, HTN, Echo Grade 2 DD, GERD, IBS, Degenerative Disk Disease, Hx Bladder  Cancer, Hyperlipidemia,  She had complained of extreme fatigue with 30 minutes of physical actiivity, going to gym 4-5x/ week. No chest pain. -Trelegy 315, Namenda, Bystolic, Oxycodone Dr Clovis Pu follows for Insomnia> seroquel -----Pt f/u finally stopped coughing, no SOB to report. Here to go over CT scans Blood pressure noted "soft" today 92/58.  She denies dizziness but will follow up with cardiology as needed.  Avoid dehydration and summer heat. She thinks it is easier for her to get dyspnea on exertion than 2 years ago if briskly exercising such as rapid walking.  After last visit cough persisted for about 2 months and is finally gone.  We reviewed her CT scans and agreed to follow-up nodules in 2 years. CT max face 12/21/21- IMPRESSION: 1. Normally aerated paranasal sinuses. Patent sinus drainage pathways. 2. Rightward deviation of the nasal septum. 3. Minimal mucosal thickening within the bilateral nasal passages. HRCT chest 12/24/21- IMPRESSION: 1. Unchanged, somewhat amorphous ground-glass opacity in the peripheral superior  segment left lower lobe measuring 0.7 cm nonspecific and most likely benign, incidental sequelae of infection or inflammation, not typical in appearance for a pulmonary metastasis. Additional follow-up could be performed in 2 years to establish 5 years of total stability if desired. 2. Stable, benign 0.6 cm subpleural nodule  of the left lung base. 3. No evidence of fibrotic interstitial lung disease. Bland appearing, bandlike scarring of the bilateral lung bases. 4. Mild, lobular air trapping on expiratory phase imaging, suggesting small airways disease. 5. New, although subacute appearing partially callused fracture of the posterior left seventh rib. 6. Coronary artery disease. Aortic Atherosclerosis (ICD10-I70.0).  ROS-see HPI   + = positive Constitutional:    weight loss, night sweats, fevers, chills, +fatigue, lassitude. HEENT:    headaches, difficulty swallowing, tooth/dental problems, sore throat,       sneezing, itching, ear ache, nasal congestion, post nasal drip, snoring CV:    chest pain, orthopnea, PND, swelling in lower extremities, anasarca,                                   dizziness, palpitations Resp:   +shortness of breath with exertion or at rest.                productive cough,   non-productive cough, coughing up of blood.              change in color of mucus.  wheezing.   Skin:    rash or lesions. GI:  No-   heartburn, indigestion, abdominal pain, nausea, vomiting, diarrhea,                 change in bowel habits, loss of appetite GU: dysuria, change in color of urine, no urgency or frequency.   flank pain. MS:   joint pain, stiffness, decreased range of motion, back pain. Neuro-     nothing unusual Psych:  change in mood or affect.  depression or anxiety.   memory loss.  OBJ- Physical Exam General- Alert, Oriented, Affect-appropriate, Distress- none acute, petite, looks well Skin- rash-none, lesions- none, excoriation- none Lymphadenopathy- none Head- atraumatic            Eyes- Gross vision intact, PERRLA, conjunctivae and secretions clear            Ears- Hearing, canals-normal            Nose- Clear, no-Septal dev, mucus, polyps, erosion, perforation             Throat- Mallampati II , mucosa clear , drainage- none, tonsils- atrophic Neck- flexible , trachea midline, no  stridor , thyroid nl, carotid no bruit Chest - symmetrical excursion , unlabored           Heart/CV- RRR , no murmur , no gallop  , no rub, nl s1 s2                           - JVD- none , edema- none, stasis changes- none, varices- none           Lung- clear to P&A, wheeze- none, cough- none , dullness-none, rub- none           Chest wall-  Abd-  Br/ Gen/ Rectal- Not done, not indicated Extrem- cyanosis- none, clubbing, none, atrophy- none, strength- nl Neuro- grossly intact to observation

## 2021-12-27 ENCOUNTER — Encounter: Payer: Self-pay | Admitting: Internal Medicine

## 2021-12-27 ENCOUNTER — Ambulatory Visit (INDEPENDENT_AMBULATORY_CARE_PROVIDER_SITE_OTHER): Payer: Medicare Other | Admitting: Internal Medicine

## 2021-12-27 VITALS — BP 92/58 | HR 67 | Ht 62.5 in | Wt 130.0 lb

## 2021-12-27 DIAGNOSIS — I25118 Atherosclerotic heart disease of native coronary artery with other forms of angina pectoris: Secondary | ICD-10-CM | POA: Diagnosis not present

## 2021-12-27 DIAGNOSIS — R911 Solitary pulmonary nodule: Secondary | ICD-10-CM | POA: Diagnosis not present

## 2021-12-27 DIAGNOSIS — R918 Other nonspecific abnormal finding of lung field: Secondary | ICD-10-CM | POA: Diagnosis not present

## 2021-12-27 DIAGNOSIS — R0609 Other forms of dyspnea: Secondary | ICD-10-CM

## 2021-12-27 NOTE — Patient Instructions (Signed)
Order- schedule future HRCT chest in 1 years, no contrast    dx lung nodules  Please call if we can help

## 2021-12-28 ENCOUNTER — Other Ambulatory Visit: Payer: Self-pay

## 2021-12-28 MED ORDER — DILTIAZEM GEL 2 %: 1.0000 | CUTANEOUS | 1 refills | Status: AC | PRN

## 2022-01-01 ENCOUNTER — Ambulatory Visit
Admission: RE | Admit: 2022-01-01 | Discharge: 2022-01-01 | Disposition: A | Discharge status: Home / Self Care | Payer: Medicare Other | Source: Ambulatory Visit | Attending: Internal Medicine | Admitting: Internal Medicine

## 2022-01-01 DIAGNOSIS — Z1231 Encounter for screening mammogram for malignant neoplasm of breast: Secondary | ICD-10-CM | POA: Diagnosis not present

## 2022-01-04 DIAGNOSIS — C674 Malignant neoplasm of posterior wall of bladder: Secondary | ICD-10-CM | POA: Diagnosis not present

## 2022-01-28 DIAGNOSIS — M65321 Trigger finger, right index finger: Secondary | ICD-10-CM | POA: Diagnosis not present

## 2022-01-28 DIAGNOSIS — M13842 Other specified arthritis, left hand: Secondary | ICD-10-CM | POA: Diagnosis not present

## 2022-01-28 DIAGNOSIS — M25532 Pain in left wrist: Secondary | ICD-10-CM | POA: Diagnosis not present

## 2022-01-28 DIAGNOSIS — M13841 Other specified arthritis, right hand: Secondary | ICD-10-CM | POA: Diagnosis not present

## 2022-01-28 DIAGNOSIS — M13849 Other specified arthritis, unspecified hand: Secondary | ICD-10-CM | POA: Diagnosis not present

## 2022-01-28 DIAGNOSIS — M79644 Pain in right finger(s): Secondary | ICD-10-CM | POA: Diagnosis not present

## 2022-02-08 ENCOUNTER — Encounter: Payer: Self-pay | Admitting: Internal Medicine

## 2022-02-08 NOTE — Assessment & Plan Note (Signed)
Probably benign as discussed. Plan-update HRCT in 2 years

## 2022-02-08 NOTE — Assessment & Plan Note (Signed)
CT has shown some evidence of small airways disease and mild basilar scarring.  Previous PFT showed only mild obstructive changes.  Watch effect of Trelegy.  Consider possibility that known cardiac disease contributes to dyspnea on exertion.

## 2022-03-08 ENCOUNTER — Encounter: Payer: Self-pay | Admitting: Gastroenterology

## 2022-03-20 DIAGNOSIS — M13849 Other specified arthritis, unspecified hand: Secondary | ICD-10-CM | POA: Diagnosis not present

## 2022-03-20 DIAGNOSIS — M79644 Pain in right finger(s): Secondary | ICD-10-CM | POA: Diagnosis not present

## 2022-03-20 DIAGNOSIS — M13841 Other specified arthritis, right hand: Secondary | ICD-10-CM | POA: Diagnosis not present

## 2022-03-20 DIAGNOSIS — M25532 Pain in left wrist: Secondary | ICD-10-CM | POA: Diagnosis not present

## 2022-03-25 ENCOUNTER — Other Ambulatory Visit: Payer: Self-pay

## 2022-03-25 MED ORDER — DICYCLOMINE HCL 10 MG PO CAPS: ORAL_CAPSULE | ORAL | 0 refills | Status: DC

## 2022-03-25 MED ORDER — HYOSCYAMINE SULFATE 0.125 MG SL SUBL: 0.1250 mg | SUBLINGUAL_TABLET | Freq: Every day | SUBLINGUAL | 0 refills | Status: DC | PRN

## 2022-04-12 DIAGNOSIS — Z23 Encounter for immunization: Secondary | ICD-10-CM | POA: Diagnosis not present

## 2022-04-15 ENCOUNTER — Encounter: Payer: Self-pay | Admitting: Psychiatry

## 2022-04-15 ENCOUNTER — Ambulatory Visit (INDEPENDENT_AMBULATORY_CARE_PROVIDER_SITE_OTHER): Payer: Medicare Other | Admitting: Psychiatry

## 2022-04-15 DIAGNOSIS — F5101 Primary insomnia: Secondary | ICD-10-CM | POA: Diagnosis not present

## 2022-04-15 DIAGNOSIS — R4789 Other speech disturbances: Secondary | ICD-10-CM

## 2022-04-15 DIAGNOSIS — I25118 Atherosclerotic heart disease of native coronary artery with other forms of angina pectoris: Secondary | ICD-10-CM | POA: Diagnosis not present

## 2022-04-15 MED ORDER — CHLORPROMAZINE HCL 10 MG PO TABS: 20.0000 mg | ORAL_TABLET | Freq: Every day | ORAL | 0 refills | Status: DC

## 2022-04-15 NOTE — Patient Instructions (Addendum)
(  NAC) N-Acetylcysteine 2 of the  600 mg capsules daily to help with mild cognitive problems.  It can be combined with a B-complex vitamin as the B-12 and folate which can sometimes enhance the effect.  Stop quetiapine and trial chlorpromazine 1-2 tablets at night for sleep

## 2022-04-15 NOTE — Progress Notes (Signed)
[name] [id number] 1950-08-27 71 y.o.  Subjective:   Patient ID:  [name] is a 71 y.o. (DOB Nov 05, 1950) female.  Chief Complaint:  Chief Complaint  Patient presents with   Follow-up    Primary insomnia   Sleeping Problem    HPI   [name] presents to the office today for follow-up of chronic insomnia and some anxiety.  seen March 2020.  No meds were changed.  Taking quetiapine daily. 80% of the time it is effective.    08/14/20 appt noted: Seroquel does work, but hangover an hour, but worth it for the sleep.  Goes to sleep and stays asleepMay stay up too late and miss the window at times.  Busy.  Not morning person. No other SE. Overall OK.  Still concerned about [name] and his memory and her memory too.  Daughter continues to be a challenge Re: health.  Son [name] is a patient here and CO not going to sleep on time.  He's 38.  Also concerned with [name]'s mental health and both his brother's have had sons who committed suicide.  1 of his B with Alzheimers and she's had chronic worry over getting Alzheimer's.  [name] is good socially and exercises.  He does Bible studies and is life of the party publicly.  But she worries over his memory at home. He has agreed to see a neurologist.   She still worries over her memory and cog function too.  Still complains of chronic worry issues and more worry over word-finding problems.  But she has no functional impairment.  Patient reports stable mood and denies depressed or irritable moods.  Patient denies any recent difficulty with anxiety.  Patient denies difficulty with sleep initiation or maintenance as long as she takes the quetiapine.  Gets 7 hours usually.  Not drowsy daytime unless she sits to read she's tired chronically. Denies appetite disturbance.  Patient reports that energy and motivation have been good.  Patient denies any difficulty with concentration.  Patient denies any suicidal ideation. Goes to gym. 4 years clean from  cancer. Plan: Continue low dose quetiapine 25 mg HS for TR insomnia. Disc NAC off label for cog  04/16/2021 appointment with the following noted: [name]'s B died Alzheimer's. She's concerned about [name]'s memory and judgment but neuropsych testing was unremarkable. She asked that I review the document with her and answer questions. Disc whether or not one can fake neuropsych testing. Usually 7 hours of sleep. No NAC tried bc of afraid of stomach problems, but she agrees to try. Plan: no med changes  04/15/22 appt noted: Cycles of sleep trouble.   Still taking Seroquel 25 mg HS.  Some days not working but partly her fault bc night owl and will fight through sleepiness.  Hangover in the morning about 90 mins.  Never felt restored in the AM. Chronic GI issues.  Don't do well with generic Nexium and cost issues. Chronic concerns about memory.  Always direction challenged and some trouble deciding how to get here this morning.  Word finding etc.  She asks about getting another MRI.  She had one in 2011 which was normal.  No other neuro changes.   She's not sure she ever tried  NAC. [name] is still active physically and active but she still worries over his cognition.   Past Psychiatric Medication Trials: Multiple sleep med failures including temazepam,  clonazepam with sedation,  alprazolam with no response,  trazodone GI side effects,  doxylamine,  Belsomra no response,  quetiapine good response at 25 mg she has hangover Lexapro nausea,   sertraline 25 mg with cognitive side effects,  .    She has been treated treated for anxiety as a way of trying to address the insomnia but that was not an adequate solution.  Prior neuropsychological testing 2004 because the patient had complaints of memory problems was negative for significant cognitive impairment  GD's psych issues.  Review of Systems:  Review of Systems  Cardiovascular:  Negative for chest pain and palpitations.  Gastrointestinal:   Positive for abdominal pain and nausea.  Musculoskeletal:  Positive for arthralgias. Negative for back pain.  Neurological:  Negative for tremors and weakness.  Psychiatric/Behavioral:  Positive for decreased concentration and sleep disturbance. Negative for agitation, behavioral problems, confusion, dysphoric mood, hallucinations, self-injury and suicidal ideas. The patient is not nervous/anxious and is not hyperactive.     Medications: I have reviewed the patient's current medications.  Current Outpatient Medications  Medication Sig Dispense Refill   ARTIFICIAL TEAR SOLUTION OP Place 1 drop into both eyes 2 (two) times daily.     atorvastatin (LIPITOR) 40 MG tablet Take 1 tablet (40 mg total) by mouth daily. 90 tablet 3   chlorproMAZINE (THORAZINE) 10 MG tablet Take 2 tablets (20 mg total) by mouth at bedtime. 60 tablet 0   Cholecalciferol (VITAMIN D) 50 MCG (2000 UT) tablet Take 2,000 Units by mouth daily.     cyclobenzaprine (FLEXERIL) 10 MG tablet Take 10 mg by mouth daily as needed for muscle spasms.     dicyclomine (BENTYL) 10 MG capsule TAKE 1 CAPSULE(10 MG) BY MOUTH TWICE DAILY AS NEEDED FOR SPASMS 180 capsule 0   Digestive Enzymes (MULTI-ENZYME) TABS Take 1 tablet by mouth daily as needed (acidic food/beans/broccoli).     diltiazem 2 % GEL Apply 1 Application topically as needed. For rectal spasm 30 g 1   ezetimibe (ZETIA) 10 MG tablet Take 1 tablet (10 mg total) by mouth daily. 90 tablet 3   hyoscyamine (LEVSIN SL) 0.125 MG SL tablet Take 1 tablet (0.125 mg total) by mouth daily as needed. 90 tablet 0   linaclotide (LINZESS) 145 MCG CAPS capsule Take 145 mcg by mouth as needed (For constipation).     nebivolol (BYSTOLIC) 2.5 MG tablet Take 1 tablet (2.5 mg total) by mouth daily. 90 tablet 3   NEXIUM 40 MG capsule BRAND NAME ONLY- take 1 capsule daily as directed 90 capsule 3   nitroGLYCERIN (NITROSTAT) 0.4 MG SL tablet place 1 tablet under the tongue if needed every 5 minutes for  chest pain for 3 doses IF NO RELIEF AFTER 3RD DOSE CALL PRESCRIBER OR 911. 25 tablet 3   Peppermint Oil 90 MG CPCR Take 2 capsules by mouth as needed (indigestion).     Probiotic Product (PROBIOTIC PO) Take 1 capsule by mouth daily.     ranolazine (RANEXA) 500 MG 12 hr tablet TAKE 1 TABLET TWICE DAILY 180 tablet 3   sodium chloride (MURO 128) 5 % ophthalmic solution Place 1 drop into both eyes 3 (three) times a week.     traMADol (ULTRAM) 50 MG tablet Take by mouth.     sucralfate (CARAFATE) 1 g tablet Take 1 tablet (1 g total) by mouth 3 (three) times daily. As needed (Patient not taking: Reported on 12/27/2021) 90 tablet 3   No current facility-administered medications for this visit.    Medication Side Effects:  A little hard to get out of the  bed  Allergies:  Allergies  Allergen Reactions   Clindamycin/Lincomycin Other (See Comments)    Pt not sure of reaction    Doxycycline Nausea And Vomiting   Escitalopram Oxalate Nausea Only   Levsinex Timecaps [Hyoscyamine] Itching   Macrodantin Other (See Comments)    Flushed, hot   Sulfa Antibiotics     Unknown reaction    Trazodone Nausea Only   Trazodone And Nefazodone Nausea Only   Trimethoprim Other (See Comments)    unknown    Epinephrine Palpitations   Lincomycin Rash   Nitrofurantoin Macrocrystal Anxiety and Rash    Flushing, hot face and ears    Past Medical History:  Diagnosis Date   Anxiety    Psychiatry Dr. Stan Head, concerns for memory loss, anxiety   Broken heart syndrome    Cancer ([place])    bladder CA   Coronary atherosclerosis CARDIOLOGIST-  DR Daneen Schick   Cath 11/2006 demonstrating moderate obstructive coronary disease with 70-80% septal perforator #1, 50-70% first diagonal, 80% third diagonal, 50% mid and distal LAD & heavy calcification throughout all 3 coronaries. LVF normal.   Essential hypertension, benign    Fatty liver 04/28/2012   GERD (gastroesophageal reflux disease)    Heart murmur    History  of esophageal spasm    takes ranexa   History of TIA (transient ischemic attack)    2011--  no residuals   Hyperlipidemia    IBS (irritable bowel syndrome)    Diarrhea predominent, esophageal spasm severe, GERD - Dr. Elicia Lamp   Insomnia    Interstitial cystitis    Dr. Gaynelle Arabian   Memory loss    Psychiatry Dr. Stan Head, concerns for memory loss, anxiety   PONV (postoperative nausea and vomiting)    AND URINARY RETENTION   TMJ (temporomandibular joint syndrome)    Wears glasses     Family History  Problem Relation Age of Onset   Heart disease Mother    Colon polyps Mother    Alzheimer's disease Mother    Dementia Mother    CAD Mother    Heart disease Father    Diverticulosis Father    CAD Father        in his 4s   Heart disease Brother    Osteoarthritis Other        Multiple family members   Obesity Other        Multiple family members   Depression Other        Multiple family members   Breast cancer Sister 53       Pam   Colon cancer Neg Hx    Esophageal cancer Neg Hx    Stomach cancer Neg Hx    Rectal cancer Neg Hx     Social History   Socioeconomic History   Marital status: Married    Spouse name: Not on file   Number of children: 2   Years of education: Not on file   Highest education level: Not on file  Occupational History   Occupation: caregiver  Tobacco Use   Smoking status: Former    Packs/day: 1.00    Years: 10.00    Total pack years: 10.00    Types: Cigarettes    Quit date: 06/10/1973    Years since quitting: 48.8   Smokeless tobacco: Never  Vaping Use   Vaping Use: Never used  Substance and Sexual Activity   Alcohol use: Yes    Alcohol/week: 2.0 standard drinks of alcohol  Types: 2 Glasses of wine per week    Comment: social   Drug use: No   Sexual activity: Yes  Other Topics Concern   Not on file  Social History Narrative   Not on file   Social Determinants of Health   Financial Resource Strain: Not on file  Food  Insecurity: Not on file  Transportation Needs: Not on file  Physical Activity: Not on file  Stress: Not on file  Social Connections: Not on file  Intimate Partner Violence: Not on file    Past Medical History, Surgical history, Social history, and Family history were reviewed and updated as appropriate.   Please see review of systems for further details on the patient's review from today.   Objective:   Physical Exam:  There were no vitals taken for this visit.  Physical Exam Constitutional:      General: She is not in acute distress.    Appearance: She is well-developed.  Musculoskeletal:        General: No deformity.  Neurological:     Mental Status: She is alert and oriented to person, place, and time.     Motor: No tremor.     Coordination: Coordination normal.     Gait: Gait normal.  Psychiatric:        Attention and Perception: Attention normal. She is attentive.        Mood and Affect: Mood is anxious. Mood is not depressed. Affect is not labile, blunt, angry or inappropriate.        Speech: Speech normal. Speech is not rapid and pressured.        Behavior: Behavior normal.        Thought Content: Thought content normal. Thought content does not include homicidal or suicidal ideation. Thought content does not include homicidal or suicidal plan.        Cognition and Memory: Cognition normal. She exhibits impaired recent memory.        Judgment: Judgment normal.     Comments: Insight is good. Residual anxiety issues.     Lab Review:     Component Value Date/Time   NA 138 07/09/2021 1358   NA 140 11/03/2018 1029   K 4.3 07/09/2021 1358   CL 103 07/09/2021 1358   CO2 28 07/09/2021 1358   GLUCOSE 86 07/09/2021 1358   BUN 11 07/09/2021 1358   BUN 11 11/03/2018 1029   CREATININE 0.61 07/09/2021 1358   CREATININE 0.80 01/12/2018 1031   CALCIUM 9.1 07/09/2021 1358   PROT 7.7 07/09/2021 1358   PROT 6.4 11/03/2018 1029   ALBUMIN 4.5 07/09/2021 1358   ALBUMIN 4.4  11/03/2018 1029   AST 23 07/09/2021 1358   ALT 21 07/09/2021 1358   ALKPHOS 97 07/09/2021 1358   BILITOT 0.3 07/09/2021 1358   BILITOT 0.2 11/03/2018 1029   GFRNONAA >60 07/09/2021 1358   GFRNONAA 77 01/12/2018 1031   GFRAA 86 11/03/2018 1029   GFRAA 89 01/12/2018 1031       Component Value Date/Time   WBC 5.4 07/09/2021 1358   RBC 4.39 07/09/2021 1358   HGB 12.4 07/09/2021 1358   HGB 12.1 11/06/2016 1545   HCT 39.2 07/09/2021 1358   HCT 37.8 11/06/2016 1545   PLT 297 07/09/2021 1358   PLT 281 11/06/2016 1545   MCV 89.3 07/09/2021 1358   MCV 89 11/06/2016 1545   MCH 28.2 07/09/2021 1358   MCHC 31.6 07/09/2021 1358   RDW 14.2 07/09/2021 1358   RDW  14.8 11/06/2016 1545   LYMPHSABS 1.5 12/24/2019 1505   MONOABS 0.6 12/24/2019 1505   EOSABS 0.1 12/24/2019 1505   BASOSABS 0.0 12/24/2019 1505    No results found for: "POCLITH", "LITHIUM"   No results found for: "PHENYTOIN", "PHENOBARB", "VALPROATE", "CBMZ"   .res Assessment: Plan:    Primary insomnia - Plan: chlorproMAZINE (THORAZINE) 10 MG tablet  Word finding difficulty  Word-finding problems  Supportive therapy on dealing with family situations and worries that [name] will get Alzheimer's also and disagreement with H on variety of subjects and he's been irritable with her.  Disc using faith to strengthen herself with stressors.   Disc chronic worry over her own memory issues with normal neuropsych testing.  Normalized some cognitive decline with age.   Disc risk of sleep meds and risk of not taking them and having poor sleep.  Disc sleep hygiene.  Re: quetiapine low dosage off label for sleep bc of TR insomnia and multiple other med failures.   It has been successful generally.  Discussed potential metabolic side effects associated with atypical antipsychotics, as well as potential risk for movement side effects. Advised pt to contact office if movement side effects occur.   Overall sleep managed but cannot with  quetiapine  Continue low dose quetiapine 25 mg HS for TR insomnia.  VS trial chlorpromazine 25 mg HS.  She wants to try it for TR insomnia.   Disc SE including risk hypotension.  Low EPS risk.    Again rec trial off-label use of N-Acetylcysteine at 600 -1200 mg daily to help with mild cognitive problems.  She's not sure she ever tried it.  It can be combined with a B-complex vitamin as the B-12 and folate have been shown to sometimes enhance the effect. Option donepezil but more GI risk than NAC  Supportive therapy dealing with concerns about H's judgment and memory issues.  He had normal neuropsych testing and does not currently have Alzheimer's dz.    This appt was 30 mins.  FU 1 year  Lynder Parents, MD, DFAPA  Please see After Visit Summary for patient specific instructions.  Future Appointments  Date Time Provider [place]  12/23/2022  9:00 AM [facility] CT 2 [facility] [address] [facility]    No orders of the defined types were placed in this encounter.     -------------------------------

## 2022-04-25 ENCOUNTER — Telehealth: Payer: Self-pay | Admitting: Psychiatry

## 2022-04-25 NOTE — Telephone Encounter (Signed)
LVM to rtc 

## 2022-04-25 NOTE — Telephone Encounter (Signed)
Patient lvm stating she is still unable to sleep. Her medication has been increased from 10 to 15 to 20, with no positive results. Please advise. Last seen 04/15/22 with no follow up scheduled.  Contact information # [phone]

## 2022-05-06 NOTE — Telephone Encounter (Signed)
I called to get more info and she just stated the chlorpromazine is not helping at all. Do you have any other suggestions for sleep?

## 2022-05-06 NOTE — Telephone Encounter (Signed)
I'm sorry I don't have any other ideas for her sleep.  I guess return to Seroquel which helped somewhat

## 2022-05-06 NOTE — Telephone Encounter (Signed)
[name] called and LM today a 12:31.  She is waiting on call back about medication for sleep.  She has not be able to sleep at all with the present medication and needs to know other options. Please call.

## 2022-05-07 NOTE — Telephone Encounter (Signed)
Please let me know what dose to send of the Seroquel to [place], [place]

## 2022-05-16 ENCOUNTER — Other Ambulatory Visit: Payer: Self-pay | Admitting: Interventional Cardiology

## 2022-05-16 ENCOUNTER — Telehealth: Payer: Self-pay | Admitting: Gastroenterology

## 2022-05-16 ENCOUNTER — Other Ambulatory Visit: Payer: Self-pay | Admitting: Psychiatry

## 2022-05-16 DIAGNOSIS — F5101 Primary insomnia: Secondary | ICD-10-CM

## 2022-05-16 NOTE — Telephone Encounter (Signed)
Rx refill sent to pharmacy. 

## 2022-05-16 NOTE — Telephone Encounter (Signed)
Ask her about the sleep med options.  Does she want to try a higher dose 3-4 of the chlorpromazine tablets I gave her last time or return to quetiapine.

## 2022-05-17 NOTE — Telephone Encounter (Signed)
LVM to rtc 

## 2022-05-21 NOTE — Telephone Encounter (Signed)
Pt called back at 10:26a returning Toni's call

## 2022-05-22 ENCOUNTER — Other Ambulatory Visit: Payer: Self-pay | Admitting: Psychiatry

## 2022-05-22 ENCOUNTER — Telehealth: Payer: Self-pay

## 2022-05-22 DIAGNOSIS — F5101 Primary insomnia: Secondary | ICD-10-CM

## 2022-05-22 MED ORDER — QUETIAPINE FUMARATE 25 MG PO TABS: 25.0000 mg | ORAL_TABLET | Freq: Every day | ORAL | 0 refills | Status: DC

## 2022-05-22 NOTE — Telephone Encounter (Signed)
sent 

## 2022-05-22 NOTE — Telephone Encounter (Signed)
I sent it to local pharmacy.  If she wants, send it to [facility]

## 2022-05-23 ENCOUNTER — Other Ambulatory Visit: Payer: Self-pay

## 2022-05-23 DIAGNOSIS — F5101 Primary insomnia: Secondary | ICD-10-CM

## 2022-05-23 MED ORDER — QUETIAPINE FUMARATE 25 MG PO TABS: 25.0000 mg | ORAL_TABLET | Freq: Every day | ORAL | 0 refills | Status: DC

## 2022-05-28 DIAGNOSIS — K219 Gastro-esophageal reflux disease without esophagitis: Secondary | ICD-10-CM | POA: Diagnosis not present

## 2022-05-28 DIAGNOSIS — G4700 Insomnia, unspecified: Secondary | ICD-10-CM | POA: Diagnosis not present

## 2022-05-28 DIAGNOSIS — N952 Postmenopausal atrophic vaginitis: Secondary | ICD-10-CM | POA: Diagnosis not present

## 2022-05-28 DIAGNOSIS — M797 Fibromyalgia: Secondary | ICD-10-CM | POA: Diagnosis not present

## 2022-05-28 DIAGNOSIS — C689 Malignant neoplasm of urinary organ, unspecified: Secondary | ICD-10-CM | POA: Diagnosis not present

## 2022-05-28 DIAGNOSIS — I5181 Takotsubo syndrome: Secondary | ICD-10-CM | POA: Diagnosis not present

## 2022-05-28 DIAGNOSIS — N951 Menopausal and female climacteric states: Secondary | ICD-10-CM | POA: Diagnosis not present

## 2022-05-28 DIAGNOSIS — K580 Irritable bowel syndrome with diarrhea: Secondary | ICD-10-CM | POA: Diagnosis not present

## 2022-05-28 DIAGNOSIS — Z0000 Encounter for general adult medical examination without abnormal findings: Secondary | ICD-10-CM | POA: Diagnosis not present

## 2022-05-28 DIAGNOSIS — I2510 Atherosclerotic heart disease of native coronary artery without angina pectoris: Secondary | ICD-10-CM | POA: Diagnosis not present

## 2022-05-28 DIAGNOSIS — E785 Hyperlipidemia, unspecified: Secondary | ICD-10-CM | POA: Diagnosis not present

## 2022-05-28 DIAGNOSIS — F411 Generalized anxiety disorder: Secondary | ICD-10-CM | POA: Diagnosis not present

## 2022-05-28 DIAGNOSIS — A6000 Herpesviral infection of urogenital system, unspecified: Secondary | ICD-10-CM | POA: Diagnosis not present

## 2022-05-28 DIAGNOSIS — Z1331 Encounter for screening for depression: Secondary | ICD-10-CM | POA: Diagnosis not present

## 2022-05-29 ENCOUNTER — Other Ambulatory Visit: Payer: Self-pay | Admitting: Internal Medicine

## 2022-05-29 DIAGNOSIS — Z13820 Encounter for screening for osteoporosis: Secondary | ICD-10-CM

## 2022-06-07 DIAGNOSIS — R1031 Right lower quadrant pain: Secondary | ICD-10-CM | POA: Diagnosis not present

## 2022-06-07 DIAGNOSIS — Z90710 Acquired absence of both cervix and uterus: Secondary | ICD-10-CM | POA: Diagnosis not present

## 2022-06-07 DIAGNOSIS — N1330 Unspecified hydronephrosis: Secondary | ICD-10-CM | POA: Diagnosis not present

## 2022-06-11 ENCOUNTER — Encounter: Payer: Self-pay | Admitting: Interventional Cardiology

## 2022-06-11 ENCOUNTER — Other Ambulatory Visit: Payer: Medicare Other

## 2022-06-11 MED ORDER — ATORVASTATIN CALCIUM 40 MG PO TABS: 40.0000 mg | ORAL_TABLET | Freq: Every day | ORAL | 1 refills | Status: DC

## 2022-07-16 ENCOUNTER — Other Ambulatory Visit: Payer: Medicare Other

## 2022-07-17 ENCOUNTER — Ambulatory Visit
Admission: RE | Admit: 2022-07-17 | Discharge: 2022-07-17 | Disposition: A | Discharge status: Home / Self Care | Payer: Medicare Other | Source: Ambulatory Visit | Attending: Internal Medicine | Admitting: Internal Medicine

## 2022-07-17 DIAGNOSIS — Z780 Asymptomatic menopausal state: Secondary | ICD-10-CM | POA: Diagnosis not present

## 2022-07-17 DIAGNOSIS — Z13820 Encounter for screening for osteoporosis: Secondary | ICD-10-CM

## 2022-07-17 DIAGNOSIS — M8589 Other specified disorders of bone density and structure, multiple sites: Secondary | ICD-10-CM | POA: Diagnosis not present

## 2022-07-18 DIAGNOSIS — Z08 Encounter for follow-up examination after completed treatment for malignant neoplasm: Secondary | ICD-10-CM | POA: Diagnosis not present

## 2022-07-18 DIAGNOSIS — Z9189 Other specified personal risk factors, not elsewhere classified: Secondary | ICD-10-CM | POA: Diagnosis not present

## 2022-07-18 DIAGNOSIS — C674 Malignant neoplasm of posterior wall of bladder: Secondary | ICD-10-CM | POA: Diagnosis not present

## 2022-07-18 DIAGNOSIS — Z8551 Personal history of malignant neoplasm of bladder: Secondary | ICD-10-CM | POA: Diagnosis not present

## 2022-07-24 ENCOUNTER — Telehealth: Payer: Self-pay | Admitting: Gastroenterology

## 2022-07-24 DIAGNOSIS — L821 Other seborrheic keratosis: Secondary | ICD-10-CM | POA: Diagnosis not present

## 2022-07-24 DIAGNOSIS — L820 Inflamed seborrheic keratosis: Secondary | ICD-10-CM | POA: Diagnosis not present

## 2022-07-24 DIAGNOSIS — D2239 Melanocytic nevi of other parts of face: Secondary | ICD-10-CM | POA: Diagnosis not present

## 2022-07-24 DIAGNOSIS — D489 Neoplasm of uncertain behavior, unspecified: Secondary | ICD-10-CM | POA: Diagnosis not present

## 2022-07-24 NOTE — Telephone Encounter (Signed)
Patient last seen in office 04/2021. Lost to follow up?  Called the patient. No answer. Left her a voicemail asking she call me back and that she also get the next available appointment.

## 2022-07-24 NOTE — Telephone Encounter (Signed)
PT is having upper gi pain, nausea, pain when eating. She's been taking Nexium and drinking baking soda with water and nothing is subsiding the discomfort. She wanted to schedule an appointment but unfortunately the PA first available isn't until March. Please advise.

## 2022-07-24 NOTE — Telephone Encounter (Signed)
PT returning call. Requesting callback

## 2022-07-26 ENCOUNTER — Ambulatory Visit: Payer: Medicare Other | Admitting: Gastroenterology

## 2022-07-26 ENCOUNTER — Encounter: Payer: Self-pay | Admitting: Gastroenterology

## 2022-07-26 VITALS — BP 124/62 | HR 58 | Ht 62.1 in | Wt 129.0 lb

## 2022-07-26 DIAGNOSIS — K581 Irritable bowel syndrome with constipation: Secondary | ICD-10-CM

## 2022-07-26 DIAGNOSIS — K219 Gastro-esophageal reflux disease without esophagitis: Secondary | ICD-10-CM | POA: Diagnosis not present

## 2022-07-26 DIAGNOSIS — R1084 Generalized abdominal pain: Secondary | ICD-10-CM

## 2022-07-26 DIAGNOSIS — R1013 Epigastric pain: Secondary | ICD-10-CM

## 2022-07-26 DIAGNOSIS — G8929 Other chronic pain: Secondary | ICD-10-CM

## 2022-07-26 NOTE — Telephone Encounter (Signed)
Patient seen in clinic today

## 2022-07-26 NOTE — Progress Notes (Signed)
[name]    [id number]    02/26/1951  Primary Care Physician:Miller, Bennetta La[place], [place]  Referring Physician: Scheryl Marten, [address] [place],  [place] [address]   Chief complaint: Abdominal pain, nausea  HPI:  72 year old very pleasant female here for follow-up visit with complaints of abdominal pain.  She is having constant epigastric and sometimes generalized abdominal discomfort, no association with food.  Abdominal pain sometimes does improve after a bowel movement.  She has irregular bowel habits.  On average has 2-3 bowel movements per week.  She is using Linzess as needed, takes 290 mcg once a week and tries to take a 72 mcg or 145 mcg occasionally rest of the week.  She got excess/left over medication from her sister and is using as needed based on her bowel habits.  She has an active lifestyle, does not want to take Linzess every day as it interferes with her day-to-day activities if she has to go to bathroom outside the house. GERD symptoms and nausea worse despite taking Nexium.  She is using Carafate and baking soda as needed    EGD April 28, 2019: Normal esophagus.  Regular Z-line.  Gastritis biopsies negative for H. Pylori.     TTG IgA antibody negative for celiac   Colonoscopy April 07, 2013 showed internal hemorrhoids otherwise normal exam   Flexible sigmoidoscopy January 22, 2016 for persistent anorectal/pelvic pain was unremarkable other than internal hemorrhoids   Abdominal ultrasound January 27, 2020: Unremarkable   CT abdomen with contrast February 09, 2020: Large colonic stool burden otherwise no acute findings.  Additional chronic findings were noted in the report including emphysema, osteopenia and atherosclerosis   Outpatient Encounter Medications as of 07/26/2022  Medication Sig   ARTIFICIAL TEAR SOLUTION OP Place 1 drop into both eyes 2 (two) times daily.   atorvastatin (LIPITOR) 40 MG tablet Take 1 tablet  (40 mg total) by mouth daily.   Cholecalciferol (VITAMIN D) 50 MCG (2000 UT) tablet Take 2,000 Units by mouth daily.   cyclobenzaprine (FLEXERIL) 10 MG tablet Take 10 mg by mouth daily as needed for muscle spasms.   dicyclomine (BENTYL) 10 MG capsule TAKE 1 CAPSULE TWICE DAILY FOR SPASM(S) AS NEEDED   Digestive Enzymes (MULTI-ENZYME) TABS Take 1 tablet by mouth daily as needed (acidic food/beans/broccoli).   diltiazem 2 % GEL Apply 1 Application topically as needed. For rectal spasm   ezetimibe (ZETIA) 10 MG tablet Take 1 tablet (10 mg total) by mouth daily.   hyoscyamine (LEVSIN SL) 0.125 MG SL tablet DISSOLVE 1 TABLET UNDER THE TONGUE EVERY DAY AS NEEDED   linaclotide (LINZESS) 145 MCG CAPS capsule Take 145 mcg by mouth as needed (For constipation).   nebivolol (BYSTOLIC) 2.5 MG tablet Take 1 tablet (2.5 mg total) by mouth daily.   NEXIUM 40 MG capsule BRAND NAME ONLY- take 1 capsule daily as directed   nitroGLYCERIN (NITROSTAT) 0.4 MG SL tablet place 1 tablet under the tongue if needed every 5 minutes for chest pain for 3 doses IF NO RELIEF AFTER 3RD DOSE CALL PRESCRIBER OR 911.   Peppermint Oil 90 MG CPCR Take 2 capsules by mouth as needed (indigestion).   Probiotic Product (PROBIOTIC PO) Take 1 capsule by mouth daily.   QUEtiapine (SEROQUEL) 25 MG tablet Take 1 tablet (25 mg total) by mouth at bedtime.   ranolazine (RANEXA) 500 MG 12 hr tablet Take 1 tablet (500  mg total) by mouth 2 (two) times daily.   sodium chloride (MURO 128) 5 % ophthalmic solution Place 1 drop into both eyes 3 (three) times a week.   sucralfate (CARAFATE) 1 g tablet Take 1 tablet (1 g total) by mouth 3 (three) times daily. As needed   traMADol (ULTRAM) 50 MG tablet Take by mouth.   No facility-administered encounter medications on file as of 07/26/2022.    Allergies as of 07/26/2022 - Review Complete 07/26/2022  Allergen Reaction Noted   Clindamycin/lincomycin Other (See Comments) 10/05/2011   Doxycycline Nausea  And Vomiting 10/05/2011   Escitalopram oxalate Nausea Only 10/05/2011   Levsinex timecaps [hyoscyamine] Itching    Macrodantin Other (See Comments) 10/05/2011   Sulfa antibiotics  07/20/2020   Trazodone Nausea Only 10/05/2011   Trazodone and nefazodone Nausea Only 10/05/2011   Trimethoprim Other (See Comments) 12/09/2013   Epinephrine Palpitations 08/23/2019   Lincomycin Rash 10/05/2011   Nitrofurantoin macrocrystal Anxiety and Rash     Past Medical History:  Diagnosis Date   Anxiety    Psychiatry Dr. Stan Head, concerns for memory loss, anxiety   Broken heart syndrome    Cancer ([place])    bladder CA   Coronary atherosclerosis CARDIOLOGIST-  DR Daneen Schick   Cath 11/2006 demonstrating moderate obstructive coronary disease with 70-80% septal perforator #1, 50-70% first diagonal, 80% third diagonal, 50% mid and distal LAD & heavy calcification throughout all 3 coronaries. LVF normal.   Essential hypertension, benign    Fatty liver 04/28/2012   GERD (gastroesophageal reflux disease)    Heart murmur    History of esophageal spasm    takes ranexa   History of TIA (transient ischemic attack)    2011--  no residuals   Hyperlipidemia    IBS (irritable bowel syndrome)    Diarrhea predominent, esophageal spasm severe, GERD - Dr. Elicia Lamp   Insomnia    Interstitial cystitis    Dr. Gaynelle Arabian   Memory loss    Psychiatry Dr. Stan Head, concerns for memory loss, anxiety   PONV (postoperative nausea and vomiting)    AND URINARY RETENTION   TMJ (temporomandibular joint syndrome)    Wears glasses     Past Surgical History:  Procedure Laterality Date   ABDOMINAL HYSTERECTOMY  AGE 69 -- Lupus   AND OVARIAN CYSTECTOMY   BENIGN EXCISIONAL BREAST BX  1996   BREAST EXCISIONAL BIOPSY Right 1992   CARDIAC CATHETERIZATION  11-28-2006   DR Mallie Mussel SMITH    moderate cad/  70-80% septal perforator #1, 50-70% first diagonal, 80% third diagonal, 50% mid and distal LAD &  heavy calcification throughout all 3 coronaries. LVF normal.   CARDIAC CATHETERIZATION  03-26-2012   DR Daneen Schick   patent CFX,  RCA,  & pLAD/  mLAD >50% to <70% a focal eccentric region that overlaps the third diagonal/  90% diagonal ostial and unchanged from prior study /   ef 65%   CARPAL TUNNEL RELEASE Bilateral    CYSTO WITH HYDRODISTENSION N/A 01/14/2014   Procedure: CYSTOSCOPY/HYDRODISTENSION/INSERTION OF MARCAINE AND PYRIDUIM INTO BLADDER/INJECTION OF MARCAINE AND KENALOG into sub trigone space ;  Surgeon: Ailene Rud, MD;  Location: [facility];  Service: Urology;  Laterality: N/A;   CYSTO/  HYDRODISTENTION/  INSTILLATION THERAPY  03/12/2010   ESOPHAGOGASTRODUODENOSCOPY  04/30/2012   Procedure: ESOPHAGOGASTRODUODENOSCOPY (EGD);  Surgeon: Lafayette Dragon, MD;  Location: [facility];  Service: Endoscopy;  Laterality: N/A;   EYE SURGERY  Bilateral 2022   KNEE ARTHROSCOPY WITH MEDIAL MENISECTOMY Left 07/12/2021   Procedure: KNEE ARTHROSCOPY WITH PARTIAL MEDIAL MENISECTOMY, CHONDROPLASTY;  Surgeon: Paralee Cancel, MD;  Location: [facility];  Service: Orthopedics;  Laterality: Left;   LEFT HEART CATH AND CORONARY ANGIOGRAPHY N/A 02/18/2017   Procedure: LEFT HEART CATH AND CORONARY ANGIOGRAPHY;  Surgeon: Belva Crome, MD;  Location: [facility];  Service: Cardiovascular;  Laterality: N/A;   LUMBAR [place] ARTHROSCOPY Left 12/23/2011   SHOULDER SURGERY Right 2010   TRANSTHORACIC ECHOCARDIOGRAM  04/21/2012   GRADE I DIASTOLIC DYSFUNCTION/  EF 60-65%/  MILD MR  &  TR   TRANSURETHRAL RESECTION OF BLADDER TUMOR Right 01/03/2015   Procedure: TRANSURETHRAL RESECTION OF BLADDER TUMOR (TURBT);  Surgeon: Carolan Clines, MD;  Location: [facility];  Service: Urology;  Laterality: Right;   TRIGGER FINGER RELEASE Bilateral 2017    Family History  Problem Relation Age of Onset   Heart disease Mother    Colon polyps Mother     Alzheimer's disease Mother    Dementia Mother    CAD Mother    Heart disease Father    Diverticulosis Father    CAD Father        in his 63s   Heart disease Brother    Osteoarthritis Other        Multiple family members   Obesity Other        Multiple family members   Depression Other        Multiple family members   Breast cancer Sister 47       Pam   Colon cancer Neg Hx    Esophageal cancer Neg Hx    Stomach cancer Neg Hx    Rectal cancer Neg Hx     Social History   Socioeconomic History   Marital status: Married    Spouse name: Not on file   Number of children: 2   Years of education: Not on file   Highest education level: Not on file  Occupational History   Occupation: caregiver  Tobacco Use   Smoking status: Former    Packs/day: 1.00    Years: 10.00    Total pack years: 10.00    Types: Cigarettes    Quit date: 06/10/1973    Years since quitting: 49.1   Smokeless tobacco: Never  Vaping Use   Vaping Use: Never used  Substance and Sexual Activity   Alcohol use: Yes    Alcohol/week: 2.0 standard drinks of alcohol    Types: 2 Glasses of wine per week    Comment: social   Drug use: No   Sexual activity: Yes  Other Topics Concern   Not on file  Social History Narrative   Not on file   Social Determinants of Health   Financial Resource Strain: Not on file  Food Insecurity: Not on file  Transportation Needs: Not on file  Physical Activity: Not on file  Stress: Not on file  Social Connections: Not on file  Intimate Partner Violence: Not on file      Review of systems: All other review of systems negative except as mentioned in the HPI.   Physical Exam: Vitals:   07/26/22 0833  BP: 124/62  Pulse: (Abnormal) 58   Body mass index is 23.52 kg/m. Gen:      No acute distress HEENT:  sclera anicteric Abd:      soft, non-tender; no palpable masses, no distension  Ext:    No edema Neuro: alert and oriented x 3 Psych: normal mood and affect  Data  Reviewed:  Reviewed labs, radiology imaging, old records and pertinent past GI work up   Assessment and Plan/Recommendations:  72 year old very pleasant female with history of esophageal spasms, GERD, bladder cancer 2016, CAD s/p TIA complains of epigastric pain, generalized abdominal discomfort and IBS constipation  Epigastric abdominal pain: Obtain abdominal ultrasound to exclude gallbladder disease or any other acute intra-abdominal pathology Okay to use Carafate twice daily as needed  GERD: Continue Nexium and antireflux measures   Generalized abdominal discomfort associated with IBS constipation: Continue with increased water intake and dietary fiber.   Use Linzess 145 mcg daily, advised patient to take it anytime of the day, it is better if she can take it consistently but given her lifestyle she will still benefit to use it anytime during the day except at bedtime so she does not get interruption of her sleep with bowel movement    Return in 2 months  This visit required >40 minutes of patient care (this includes precharting, chart review, review of results, face-to-face time used for counseling as well as treatment plan and follow-up. The patient was provided an opportunity to ask questions and all were answered. The patient agreed with the plan and demonstrated an understanding of the instructions.  Damaris Hippo , MD    CC: Scheryl Marten, [place]

## 2022-07-26 NOTE — Patient Instructions (Signed)
You have been scheduled for an abdominal ultrasound at [facility] (1st floor of hospital)  on 08/01/2022 at 9 am . Please arrive 30 minutes prior to your appointment for registration. Make certain not to have anything to eat or drink After midnight prior to your appointment. Should you need to reschedule your appointment, please contact radiology at [phone]. This test typically takes about 30 minutes to perform.   Use Linzess 145 mcg daily ( OK to use anytime of the day if unable to take before breakfast except at bedtime)  Due to recent changes in healthcare laws, you may see the results of your imaging and laboratory studies on MyChart before your provider has had a chance to review them.  We understand that in some cases there may be results that are confusing or concerning to you. Not all laboratory results come back in the same time frame and the provider may be waiting for multiple results in order to interpret others.  Please give [place] 48 hours in order for your provider to thoroughly review all the results before contacting the office for clarification of your results.    _______________________________________________________  If your blood pressure at your visit was 140/90 or greater, please contact your primary care physician to follow up on this.  _______________________________________________________  If you are age 34 or older, your body mass index should be between 23-30. Your Body mass index is 23.52 kg/m. If this is out of the aforementioned range listed, please consider follow up with your Primary Care Provider.  If you are age 29 or younger, your body mass index should be between 19-25. Your Body mass index is 23.52 kg/m. If this is out of the aformentioned range listed, please consider follow up with your Primary Care Provider.   ________________________________________________________  The [place] [facility] providers would like to encourage you to use [facility] to  communicate with providers for non-urgent requests or questions.  Due to long hold times on the telephone, sending your provider a message by [facility] may be a faster and more efficient way to get a response.  Please allow 48 business hours for a response.  Please remember that this is for non-urgent requests.  _______________________________________________________   I appreciate the  opportunity to care for you  Thank You   Harl Bowie , MD

## 2022-08-01 ENCOUNTER — Ambulatory Visit (HOSPITAL_COMMUNITY)
Admission: RE | Admit: 2022-08-01 | Discharge: 2022-08-01 | Disposition: A | Discharge status: Home / Self Care | Payer: Medicare Other | Source: Ambulatory Visit | Attending: Gastroenterology | Admitting: Gastroenterology

## 2022-08-01 DIAGNOSIS — K581 Irritable bowel syndrome with constipation: Secondary | ICD-10-CM | POA: Insufficient documentation

## 2022-08-01 DIAGNOSIS — R1084 Generalized abdominal pain: Secondary | ICD-10-CM | POA: Insufficient documentation

## 2022-08-01 DIAGNOSIS — M25532 Pain in left wrist: Secondary | ICD-10-CM | POA: Diagnosis not present

## 2022-08-01 DIAGNOSIS — Z5189 Encounter for other specified aftercare: Secondary | ICD-10-CM | POA: Diagnosis not present

## 2022-08-01 DIAGNOSIS — K7689 Other specified diseases of liver: Secondary | ICD-10-CM | POA: Diagnosis not present

## 2022-08-01 DIAGNOSIS — K219 Gastro-esophageal reflux disease without esophagitis: Secondary | ICD-10-CM | POA: Diagnosis not present

## 2022-08-01 DIAGNOSIS — M65321 Trigger finger, right index finger: Secondary | ICD-10-CM | POA: Diagnosis not present

## 2022-08-01 DIAGNOSIS — M13841 Other specified arthritis, right hand: Secondary | ICD-10-CM | POA: Diagnosis not present

## 2022-08-06 NOTE — Progress Notes (Signed)
Cardiology Office Note:    Date:  08/13/2022   ID:  [name], DOB 04/10/1951, MRN [id number]  PCP:  Scheryl Marten, [facility] Providers Cardiologist:  Sinclair Grooms, MD (Inactive)     Referring MD: Sabra Heck, [place], [place]   Chief Complaint: follow-up CAD  History of Present Illness:    [name] is a very pleasant 72 y.o. female with a hx of CAD s/p NSTEMI, intermittent exertional angina stroke, hypertension, hyperlipidemia, bladder cancer, and anxiety disorder.   Cardiac catheterization 11/2006 demonstrating moderate obstructive CAD with 70-80% septal perforator #1, 50-70% first diagonal, 80% third diagonal, 50% mid and distal LAD, and heavy calcification throughout all 3 coronaries with normal LVEF.   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 EF 25-35% with mildly elevated LVEDP, severe diffuse calcification of the proximal to mid left anterior descending, 30-40% proximal LAD, 60-70% mid LAD, ostial 80-90% first diagonal, with mild luminal irregularities in RCA and circumflex arteries. 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   Seen by Dr. Tamala Julian 02/2021  at which time she was experiencing intermittent palpitations, palpable irregular heartbeat, and exertional fatigue. Cardiac monitor revealed NSR with occasional PACs and PVCs, with symptoms correlating with PACs and PVCs. She was advised to follow-up in 6 months.   Seen in cardiology clinic by me on 10/17/21 at which time she had lingering cough from recent respiratory illness. Feeling fatigued and not able to exercise like she would like. Notes dyspnea on exertion with recent walking, does not feel like she can go at normal pace. This was occurring prior to URI. Taking Ranexa only prior to working out, has been doing this for some time - does not feel like taking it consistently gave her more stamina with  working out. Can generally go hard on the elliptical for 9-12 minutes before she has symptoms of chest discomfort. No chest discomfort with walking but now having more shortness of breath, cannot tell if it is coming from her lungs or her heart. Was prescribed an inhaler by pulmonology for use prior to exercise but has not used it. Denied edema, orthopnea, PND, presyncope, syncope, palpitations, or weakness. Concerned about elevated LDL of 88 done by PCP on 05/24/21. Is overall a healthy eater but admits to some recent dietary indiscretion. We considered further cardiac testing and consulted with Dr. Tamala Julian but no tests were ordered at that time. Planned to pursue if symptoms persisted.  Today, she is here alone for follow-up. Reports she is feeling well. Continues to note increased shortness of breath and fatigue at approximately 5 minutes into exercise which lasts for about 10-15 minutes then improves. Has been holding Ranexa on days that she does not exercise. Exercises several days per week, including cardio and strength training. Feels very fatigued after working out. We discussed increasing her protein intake. Likes to eat a healthy diet, but admits her husband likes to eat out frequently. She denies shortness of breath, lower extremity edema, palpitations, melena, hematuria, hemoptysis, diaphoresis, weakness, presyncope, syncope, orthopnea, and PND.  Past Medical History:  Diagnosis Date   Anxiety    Psychiatry Dr. Stan Head, concerns for memory loss, anxiety   Broken heart syndrome    Cancer [facility])    bladder CA   Coronary atherosclerosis CARDIOLOGIST-  DR Daneen Schick   Cath 11/2006 demonstrating moderate obstructive coronary disease with 70-80% septal perforator #1, 50-70% first diagonal, 80% third  diagonal, 50% mid and distal LAD & heavy calcification throughout all 3 coronaries. LVF normal.   Essential hypertension, benign    Fatty liver 04/28/2012   GERD (gastroesophageal reflux disease)     Heart murmur    History of esophageal spasm    takes ranexa   History of TIA (transient ischemic attack)    2011--  no residuals   Hyperlipidemia    IBS (irritable bowel syndrome)    Diarrhea predominent, esophageal spasm severe, GERD - Dr. Elicia Lamp   Insomnia    Interstitial cystitis    Dr. Gaynelle Arabian   Memory loss    Psychiatry Dr. Stan Head, concerns for memory loss, anxiety   PONV (postoperative nausea and vomiting)    AND URINARY RETENTION   TMJ (temporomandibular joint syndrome)    Wears glasses     Past Surgical History:  Procedure Laterality Date   ABDOMINAL HYSTERECTOMY  AGE 63 -- [facility]   AND OVARIAN CYSTECTOMY   BENIGN EXCISIONAL BREAST BX  1996   BREAST EXCISIONAL BIOPSY Right 1992   CARDIAC CATHETERIZATION  11-28-2006   DR Mallie Mussel SMITH    moderate cad/  70-80% septal perforator #1, 50-70% first diagonal, 80% third diagonal, 50% mid and distal LAD & heavy calcification throughout all 3 coronaries. LVF normal.   CARDIAC CATHETERIZATION  03-26-2012   DR Daneen Schick   patent CFX,  RCA,  & pLAD/  mLAD >50% to <70% a focal eccentric region that overlaps the third diagonal/  90% diagonal ostial and unchanged from prior study /   ef 65%   CARPAL TUNNEL RELEASE Bilateral    CYSTO WITH HYDRODISTENSION N/A 01/14/2014   Procedure: CYSTOSCOPY/HYDRODISTENSION/INSERTION OF MARCAINE AND PYRIDUIM INTO BLADDER/INJECTION OF MARCAINE AND KENALOG into sub trigone space ;  Surgeon: Ailene Rud, MD;  Location: [facility];  Service: Urology;  Laterality: N/A;   CYSTO/  HYDRODISTENTION/  INSTILLATION THERAPY  03/12/2010   ESOPHAGOGASTRODUODENOSCOPY  04/30/2012   Procedure: ESOPHAGOGASTRODUODENOSCOPY (EGD);  Surgeon: Lafayette Dragon, MD;  Location: [facility];  Service: Endoscopy;  Laterality: N/A;   EYE SURGERY Bilateral 2022   KNEE ARTHROSCOPY WITH MEDIAL MENISECTOMY Left 07/12/2021   Procedure: KNEE ARTHROSCOPY WITH PARTIAL MEDIAL  MENISECTOMY, CHONDROPLASTY;  Surgeon: Paralee Cancel, MD;  Location: [facility];  Service: Orthopedics;  Laterality: Left;   LEFT HEART CATH AND CORONARY ANGIOGRAPHY N/A 02/18/2017   Procedure: LEFT HEART CATH AND CORONARY ANGIOGRAPHY;  Surgeon: Belva Crome, MD;  Location: [facility];  Service: Cardiovascular;  Laterality: N/A;   LUMBAR [place] ARTHROSCOPY Left 12/23/2011   SHOULDER SURGERY Right 2010   TRANSTHORACIC ECHOCARDIOGRAM  04/21/2012   GRADE I DIASTOLIC DYSFUNCTION/  EF 60-65%/  MILD MR  &  TR   TRANSURETHRAL RESECTION OF BLADDER TUMOR Right 01/03/2015   Procedure: TRANSURETHRAL RESECTION OF BLADDER TUMOR (TURBT);  Surgeon: Carolan Clines, MD;  Location: [facility];  Service: Urology;  Laterality: Right;   TRIGGER FINGER RELEASE Bilateral 2017    Current Medications: Current Meds  Medication Sig   acyclovir (ZOVIRAX) 400 MG tablet Take 400 mg by mouth as needed.   ARTIFICIAL TEAR SOLUTION OP Place 1 drop into both eyes 2 (two) times daily.   Aspirin 81 MG CAPS Take 81 mg by mouth daily.   atorvastatin (LIPITOR) 40 MG tablet Take 1 tablet (40 mg total) by mouth daily.   Cholecalciferol (VITAMIN D) 50 MCG (2000 UT)  tablet Take 2,000 Units by mouth daily.   cyclobenzaprine (FLEXERIL) 10 MG tablet Take 10 mg by mouth daily as needed for muscle spasms.   dicyclomine (BENTYL) 10 MG capsule TAKE 1 CAPSULE TWICE DAILY FOR SPASM(S) AS NEEDED   Digestive Enzymes (MULTI-ENZYME) TABS Take 1 tablet by mouth daily as needed (acidic food/beans/broccoli).   diltiazem 2 % GEL Apply 1 Application topically as needed. For rectal spasm   ezetimibe (ZETIA) 10 MG tablet Take 1 tablet (10 mg total) by mouth daily.   fluticasone (FLONASE) 50 MCG/ACT nasal spray 2 sprays Once Daily.   hyoscyamine (LEVSIN SL) 0.125 MG SL tablet DISSOLVE 1 TABLET UNDER THE TONGUE EVERY DAY AS NEEDED   linaclotide (LINZESS) 145 MCG CAPS capsule Take 145 mcg by mouth as needed  (For constipation).   nebivolol (BYSTOLIC) 2.5 MG tablet Take 1 tablet (2.5 mg total) by mouth daily.   NEXIUM 40 MG capsule BRAND NAME ONLY- take 1 capsule daily as directed   nitroGLYCERIN (NITROSTAT) 0.4 MG SL tablet place 1 tablet under the tongue if needed every 5 minutes for chest pain for 3 doses IF NO RELIEF AFTER 3RD DOSE CALL PRESCRIBER OR 911.   Peppermint Oil 90 MG CPCR Take 2 capsules by mouth as needed (indigestion).   Probiotic Product (PROBIOTIC PO) Take 1 capsule by mouth daily.   QUEtiapine (SEROQUEL) 25 MG tablet Take 1 tablet (25 mg total) by mouth at bedtime.   ranolazine (RANEXA) 500 MG 12 hr tablet Take 1 tablet (500 mg total) by mouth 2 (two) times daily.   sodium chloride (MURO 128) 5 % ophthalmic solution Place 1 drop into both eyes 3 (three) times a week.   sucralfate (CARAFATE) 1 g tablet Take 1 tablet (1 g total) by mouth 3 (three) times daily. As needed   traMADol (ULTRAM) 50 MG tablet Take by mouth.   traMADol-acetaminophen (ULTRACET) 37.5-325 MG tablet      Allergies:   Clindamycin/lincomycin, Doxycycline, Escitalopram oxalate, Levsinex timecaps [hyoscyamine], Macrodantin, Sulfa antibiotics, Trazodone and nefazodone, Trimethoprim, Epinephrine, Lincomycin, and Nitrofurantoin macrocrystal   Social History   Socioeconomic History   Marital status: Married    Spouse name: Not on file   Number of children: 2   Years of education: Not on file   Highest education level: Not on file  Occupational History   Occupation: caregiver  Tobacco Use   Smoking status: Former    Packs/day: 1.00    Years: 10.00    Total pack years: 10.00    Types: Cigarettes    Quit date: 06/10/1973    Years since quitting: 49.2   Smokeless tobacco: Never  Vaping Use   Vaping Use: Never used  Substance and Sexual Activity   Alcohol use: Yes    Alcohol/week: 2.0 standard drinks of alcohol    Types: 2 Glasses of wine per week    Comment: social   Drug use: No   Sexual activity: Yes   Other Topics Concern   Not on file  Social History Narrative   Not on file   Social Determinants of Health   Financial Resource Strain: Not on file  Food Insecurity: Not on file  Transportation Needs: Not on file  Physical Activity: Not on file  Stress: Not on file  Social Connections: Not on file     Family History: The patient's family history includes Alzheimer's disease in her mother; Breast cancer (age of onset: 73) in her sister; CAD in her father and mother; Colon polyps  in her mother; Dementia in her mother; Depression in an other family member; Diverticulosis in her father; Heart disease in her brother, father, and mother; Obesity in an other family member; Osteoarthritis in an other family member. There is no history of Colon cancer, Esophageal cancer, Stomach cancer, or Rectal cancer.  ROS:   Please see the history of present illness.    +dyspnea on exertion All other systems reviewed and are negative.  Labs/Other Studies Reviewed:    The following studies were reviewed today:  Cardiac monitor 04/27/21  Normal sinus rhythm Occasional PAC's and PVC's Flutter complaint correlates with PAC's and PVC's   Echo 03/2017  Left ventricle:  The cavity size was normal. Wall thickness was  normal. Systolic function was normal. The estimated ejection  fraction was in the range of 60% to 65%. Wall motion was normal;  there were no regional wall motion abnormalities. Features are  consistent with a pseudonormal left ventricular filling pattern,  with concomitant abnormal relaxation and increased filling pressure  (grade 2 diastolic dysfunction).  Aortic valve:   Structurally normal valve.   Cusp separation was  normal.  Doppler:  Transvalvular velocity was within the normal  range. There was no stenosis. There was no regurgitation.  Aorta: Aortic root: The aortic root was normal in size.  Ascending aorta: The ascending aorta was normal in size.  Mitral valve:    Structurally normal valve.   Leaflet separation was  normal.  Doppler:  Transvalvular velocity was within the normal  range. There was no evidence for stenosis. There was no  regurgitation.    Peak gradient (D): 2 mm Hg.  Left atrium:  The atrium was normal in size.  Right ventricle:  The cavity size was normal. Systolic function was  normal.  Pulmonic valve:    The valve appears to be grossly normal.  Doppler:  There was no significant regurgitation.  Tricuspid valve:   Structurally normal valve.   Leaflet separation  was normal.  Doppler:  Transvalvular velocity was within the normal  range. There was trivial regurgitation.  Right atrium:  The atrium was normal in size.  Pericardium: There was no pericardial effusion.  Systemic veins:  Inferior vena cava: The vessel was normal in size. The  respirophasic diameter changes were in the normal range (>= 50%),  consistent with normal central venous pressure.   LHC 02/2017  Abnormal left ventricular wall motion suggesting Stress cardiomyopathy with atypical features (preserved apex function). EF 25-35% with mildly elevated LVEDP. Severe diffuse calcification of the proximal to mid left anterior descending. 30-40% proximal LAD, 60-70% mid LAD, ostial 80-90% first diagonal, with mild luminal irregularities in the right coronary and circumflex coronary arteries.   RECOMMENDATIONS:   When compared to prior angiography, no significant change has occurred other than the presence of the wall motion abnormality identified above. Perhaps the first diagonal obstruction is slightly worse. Did not consider intervention on the diagonal as it does not explain the patient's wall motion abnormality. 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  Diagnostic Dominance: Right Intervention   Recent Labs: No results found for requested labs within last 365 days.  Recent Lipid Panel From PCP 05/24/21 HDL 52 LDL 88 Trigs 166  Risk  Assessment/Calculations:      Physical Exam:    VS:  BP 126/64   Pulse 66   Ht 5' 2.5" (1.588 m)   Wt 130 lb 6.4 oz (59.1 kg)   SpO2 99%   BMI 23.47 kg/m  Wt Readings from Last 3 Encounters:  08/13/22 130 lb 6.4 oz (59.1 kg)  07/26/22 129 lb (58.5 kg)  12/27/21 130 lb (59 kg)     GEN:  Well nourished, well developed in no acute distress HEENT: Normal NECK: No JVD; No carotid bruits CARDIAC: RRR, no murmurs, rubs, gallops RESPIRATORY:  Clear to auscultation without rales, wheezing or rhonchi  ABDOMEN: Soft, non-tender, non-distended MUSCULOSKELETAL:  No edema; No deformity. 2+ pedal pulses, equal bilaterally SKIN: Warm and dry NEUROLOGIC:  Alert and oriented x 3 PSYCHIATRIC:  Normal affect   EKG:  EKG is ordered today.  EKG reveals normal sinus rhythm at 66 bpm, no ST abnormality  Diagnoses:    1. Coronary artery disease of native artery of native heart with stable angina pectoris (HCC)   2. Palpitations   3. Essential hypertension   4. Hyperlipidemia LDL goal <70     Assessment and Plan:     CAD with stable angina: Diffuse CAD by cath 2008. Has angina on exertion, generally lasting about 10 minutes starting with after 5-6 minutes of exercise. Feels very fatigued after exercise. Not taking Ranexa on days she does not exercise. Lengthy discussion about management options including adding Imdur or taking SL NTG prior to exercise. I feel that she may benefit from a more regular schedule of taking Ranexa 500 mg twice daily. She is agreeable to try this. Continue secondary prevention with heart healthy, mostly plant based diet, and regular exercise. LDL is well controlled.  Continue nebivolol, ezetimibe, aspirin, atorvastatin, ranolazine.   Palpitations: Quiescent at this time. Continue nebivolol.   Hyperlipidemia LDL goal < 70: LDL 55 on 05/28/22. Continue heart healthy diet and regular exercise.  Continue atorvastatin, ezetimibe.  Hypertension: BP well-controlled     Disposition: 6 months with me   Medication Adjustments/Labs and Tests Ordered: Current medicines are reviewed at length with the patient today.  Concerns regarding medicines are outlined above.  Orders Placed This Encounter  Procedures   EKG 12-Lead   No orders of the defined types were placed in this encounter.   Patient Instructions  Medication Instructions:  Your physician recommends that you continue on your current medications as directed. Please refer to the Current Medication list given to you today.  *If you need a refill on your cardiac medications before your next appointment, please call your pharmacy*   Lab Work: None ordered. If you have labs (blood work) drawn today and your tests are completely normal, you will receive your results only by: [place] (if you have MyChart) OR A paper copy in the mail If you have any lab test that is abnormal or we need to change your treatment, we will call you to review the results.   Testing/Procedures: None ordered.   Follow-Up: At [facility], you and your health needs are our priority.  As part of our continuing mission to provide you with exceptional heart care, we have created designated Provider Care Teams.  These Care Teams include your primary Cardiologist (physician) and Advanced Practice Providers (APPs -  Physician Assistants and Nurse Practitioners) who all work together to provide you with the care you need, when you need it.  We recommend signing up for the patient portal called "MyChart".  Sign up information is provided on this After Visit Summary.  MyChart is used to connect with patients for Virtual Visits (Telemedicine).  Patients are able to view lab/test results, encounter notes, upcoming appointments, etc.  Non-urgent messages can be sent  to your provider as well.   To learn more about what you can do with MyChart, go to [email or web address].    Your next appointment:   6  month(s)  Provider:   Christen Bame, NP         Other Instructions Your physician wants you to follow-up in: 6 months. You will receive a reminder letter in the mail two months in advance. If you don't receive a letter, please call our office to schedule the follow-up appointment.     Signed, Emmaline Life, NP  08/13/2022 4:36 PM    [place]

## 2022-08-13 ENCOUNTER — Encounter: Payer: Self-pay | Admitting: Nurse Practitioner

## 2022-08-13 ENCOUNTER — Ambulatory Visit: Payer: Medicare Other | Attending: Nurse Practitioner | Admitting: Nurse Practitioner

## 2022-08-13 VITALS — BP 126/64 | HR 66 | Ht 62.5 in | Wt 130.4 lb

## 2022-08-13 DIAGNOSIS — I10 Essential (primary) hypertension: Secondary | ICD-10-CM | POA: Diagnosis not present

## 2022-08-13 DIAGNOSIS — I25118 Atherosclerotic heart disease of native coronary artery with other forms of angina pectoris: Secondary | ICD-10-CM

## 2022-08-13 DIAGNOSIS — R002 Palpitations: Secondary | ICD-10-CM | POA: Diagnosis not present

## 2022-08-13 DIAGNOSIS — E785 Hyperlipidemia, unspecified: Secondary | ICD-10-CM

## 2022-08-13 NOTE — Patient Instructions (Addendum)
Medication Instructions:  Your physician recommends that you continue on your current medications as directed. Please refer to the Current Medication list given to you today.  *If you need a refill on your cardiac medications before your next appointment, please call your pharmacy*   Lab Work: None ordered. If you have labs (blood work) drawn today and your tests are completely normal, you will receive your results only by: [place] (if you have MyChart) OR A paper copy in the mail If you have any lab test that is abnormal or we need to change your treatment, we will call you to review the results.   Testing/Procedures: None ordered.   Follow-Up: At [facility], you and your health needs are our priority.  As part of our continuing mission to provide you with exceptional heart care, we have created designated Provider Care Teams.  These Care Teams include your primary Cardiologist (physician) and Advanced Practice Providers (APPs -  Physician Assistants and Nurse Practitioners) who all work together to provide you with the care you need, when you need it.  We recommend signing up for the patient portal called "MyChart".  Sign up information is provided on this After Visit Summary.  MyChart is used to connect with patients for Virtual Visits (Telemedicine).  Patients are able to view lab/test results, encounter notes, upcoming appointments, etc.  Non-urgent messages can be sent to your provider as well.   To learn more about what you can do with MyChart, go to [email or web address].    Your next appointment:   6 month(s)  Provider:   Christen Bame, NP         Other Instructions Your physician wants you to follow-up in: 6 months. You will receive a reminder letter in the mail two months in advance. If you don't receive a letter, please call our office to schedule the follow-up appointment.

## 2022-08-16 ENCOUNTER — Encounter: Payer: Self-pay | Admitting: Gastroenterology

## 2022-09-01 ENCOUNTER — Other Ambulatory Visit: Payer: Self-pay

## 2022-09-01 DIAGNOSIS — F5101 Primary insomnia: Secondary | ICD-10-CM

## 2022-09-02 ENCOUNTER — Telehealth: Payer: Self-pay | Admitting: Psychiatry

## 2022-09-02 DIAGNOSIS — F5101 Primary insomnia: Secondary | ICD-10-CM

## 2022-09-02 MED ORDER — NEBIVOLOL HCL 2.5 MG PO TABS: 2.5000 mg | ORAL_TABLET | Freq: Every day | ORAL | 3 refills | Status: DC

## 2022-09-02 MED ORDER — QUETIAPINE FUMARATE 25 MG PO TABS: 25.0000 mg | ORAL_TABLET | Freq: Every day | ORAL | 0 refills | Status: DC

## 2022-09-02 NOTE — Telephone Encounter (Signed)
[name] called at 10:15 to request refill of her Seroquel.  Disregard request from [facility] [address].  They should not have sent in a request.  She is now using [facility].  Please send the prescription to [facility]. She has another prescription from them that shows an address of [address], [place], [place] pharmacy and [facility] on [address] and preferred pharmacies.  NO appt.  Seen last 11/23 and is due back 11/24

## 2022-09-02 NOTE — Telephone Encounter (Signed)
Updated pharmacy profile and Rx sent.

## 2022-09-03 NOTE — Telephone Encounter (Signed)
Abdominal ultrasound was unremarkable, no gallstones or gallbladder disease.  Please order CT abdomen pelvis with contrast for further evaluation of the right-sided abdominal pain.  And schedule follow-up office visit.  Thank you

## 2022-09-04 ENCOUNTER — Other Ambulatory Visit: Payer: Self-pay

## 2022-09-04 DIAGNOSIS — K5909 Other constipation: Secondary | ICD-10-CM

## 2022-09-04 DIAGNOSIS — R1084 Generalized abdominal pain: Secondary | ICD-10-CM

## 2022-09-04 DIAGNOSIS — R109 Unspecified abdominal pain: Secondary | ICD-10-CM

## 2022-09-16 ENCOUNTER — Ambulatory Visit (HOSPITAL_COMMUNITY)
Admission: RE | Admit: 2022-09-16 | Discharge: 2022-09-16 | Disposition: A | Discharge status: Home / Self Care | Payer: Medicare Other | Source: Ambulatory Visit | Attending: Gastroenterology | Admitting: Gastroenterology

## 2022-09-16 ENCOUNTER — Encounter (HOSPITAL_COMMUNITY): Payer: Self-pay

## 2022-09-16 DIAGNOSIS — R1084 Generalized abdominal pain: Secondary | ICD-10-CM | POA: Diagnosis not present

## 2022-09-16 DIAGNOSIS — K760 Fatty (change of) liver, not elsewhere classified: Secondary | ICD-10-CM | POA: Diagnosis not present

## 2022-09-16 DIAGNOSIS — K5909 Other constipation: Secondary | ICD-10-CM | POA: Insufficient documentation

## 2022-09-16 DIAGNOSIS — R109 Unspecified abdominal pain: Secondary | ICD-10-CM | POA: Insufficient documentation

## 2022-09-16 DIAGNOSIS — I700 Atherosclerosis of aorta: Secondary | ICD-10-CM | POA: Diagnosis not present

## 2022-09-16 MED ORDER — IOHEXOL 300 MG/ML  SOLN
100.0000 mL | Freq: Once | INTRAMUSCULAR | Status: AC | PRN
  Administered 2022-09-16: 100 mL via INTRAVENOUS

## 2022-09-16 MED ORDER — SODIUM CHLORIDE (PF) 0.9 % IJ SOLN
INTRAMUSCULAR | Status: AC
  Filled 2022-09-16: qty 50

## 2022-09-25 NOTE — Progress Notes (Signed)
[name]    [id number]    [birth year]  Primary Care Physician:Miller, Deloria Lair, [place]  Referring Physician: Collene Mares, PA [address] [place],  [place] [address]   Chief complaint: Abdominal pain, constipation Chief Complaint  Patient presents with   Constipation    Patient states she is doing ok today. Patient has not done the bowel purge like instructed by the nurse. Patient states she has been very constipated. Patient reports she has not had abd pain today.   HPI: 72 year old very pleasant female here for follow-up visit with complaints of abdominal pain and constipation   I last saw her on 07/26/2022. At that time, she was having constant epigastric and sometimes generalized abdominal discomfort, no association with food. She had irregular bowel habits, having 2-3 BM weekly. She was taking Linzess as needed. Her GERD symptoms and nausea were worse despite taking Nexium and was using Carafate and baking soda as needed  Today, she complains of chronic constipation and states that she's been taking her linzess 145 mcg more often and has been having more BM in recent weeks. She states that she had an episode of overflow diarrhea that lasted for several days but is now back to normal. She states that she has reduced her food intake as it makes her feel more comfortable and is requesting a refer to an allergist. We also reviewed her last CT scan and discussed the results.     She states that she never did the bowel purge that was recommended by the nurse.   she denies diarrhea, nausea, blood in stool, black stool, vomiting, abdominal pain, bloating, unintentional weight loss, reflux, dysphagia.  GI Hx:   CT Abdomen Pelvis w contrast 09-16-22 1. Mild pre pyloric gastric wall thickening, can be seen with gastritis or peptic ulcer disease. 2. Moderate colonic stool burden with fecalization of distal small bowel contents, suggesting slow  transit/constipation. No bowel obstruction or inflammation. 3. Mild hepatic steatosis. 4. Tiny fat containing supraumbilical ventral abdominal wall hernia.  US Abdomen 08-01-22 1. No acute abnormality identified. 2. Mild diffuse increased echotexture of the liver. This is nonspecific but can be seen in fatty infiltration of liver.  EGD April 28, 2019: Normal esophagus.  Regular Z-line.  Gastritis biopsies negative for H. Pylori.     TTG IgA antibody negative for celiac   Colonoscopy April 07, 2013 showed internal hemorrhoids otherwise normal exam   Flexible sigmoidoscopy January 22, 2016 for persistent anorectal/pelvic pain was unremarkable other than internal hemorrhoids   Abdominal ultrasound January 27, 2020: Unremarkable   CT abdomen with contrast February 09, 2020: Large colonic stool burden otherwise no acute findings.  Additional chronic findings were noted in the report including emphysema, osteopenia and atherosclerosis   Current Outpatient Medications:    acyclovir (ZOVIRAX) 400 MG tablet, Take 400 mg by mouth as needed., Disp: , Rfl:    ARTIFICIAL TEAR SOLUTION OP, Place 1 drop into both eyes 2 (two) times daily., Disp: , Rfl:    Aspirin 81 MG CAPS, Take 81 mg by mouth daily., Disp: , Rfl:    atorvastatin (LIPITOR) 40 MG tablet, Take 1 tablet (40 mg total) by mouth daily., Disp: 90 tablet, Rfl: 1   Cholecalciferol (VITAMIN D) 50 MCG (2000 UT) tablet, Take 2,000 Units by mouth daily., Disp: , Rfl:    cyclobenzaprine (FLEXERIL) 10 MG tablet, Take 10 mg by mouth daily as needed for  muscle spasms., Disp: , Rfl:    dicyclomine (BENTYL) 10 MG capsule, TAKE 1 CAPSULE TWICE DAILY FOR SPASM(S) AS NEEDED, Disp: 180 capsule, Rfl: 3   Digestive Enzymes (MULTI-ENZYME) TABS, Take 1 tablet by mouth daily as needed (acidic food/beans/broccoli)., Disp: , Rfl:    diltiazem 2 % GEL, Apply 1 Application topically as needed. For rectal spasm, Disp: 30 g, Rfl: 1   ezetimibe (ZETIA) 10 MG  tablet, Take 1 tablet (10 mg total) by mouth daily., Disp: 90 tablet, Rfl: 3   fluticasone (FLONASE) 50 MCG/ACT nasal spray, 2 sprays Once Daily., Disp: , Rfl:    hyoscyamine (LEVSIN SL) 0.125 MG SL tablet, DISSOLVE 1 TABLET UNDER THE TONGUE EVERY DAY AS NEEDED, Disp: 90 tablet, Rfl: 3   linaclotide (LINZESS) 145 MCG CAPS capsule, Take 145 mcg by mouth as needed (For constipation)., Disp: , Rfl:    nebivolol (BYSTOLIC) 2.5 MG tablet, Take 1 tablet (2.5 mg total) by mouth daily., Disp: 90 tablet, Rfl: 3   NEXIUM 40 MG capsule, BRAND NAME ONLY- take 1 capsule daily as directed, Disp: 90 capsule, Rfl: 3   nitroGLYCERIN (NITROSTAT) 0.4 MG SL tablet, place 1 tablet under the tongue if needed every 5 minutes for chest pain for 3 doses IF NO RELIEF AFTER 3RD DOSE CALL PRESCRIBER OR 911., Disp: 25 tablet, Rfl: 3   Peppermint Oil 90 MG CPCR, Take 2 capsules by mouth as needed (indigestion)., Disp: , Rfl:    Probiotic Product (PROBIOTIC PO), Take 1 capsule by mouth daily., Disp: , Rfl:    QUEtiapine (SEROQUEL) 25 MG tablet, Take 1 tablet (25 mg total) by mouth at bedtime., Disp: 90 tablet, Rfl: 0   ranolazine (RANEXA) 500 MG 12 hr tablet, Take 1 tablet (500 mg total) by mouth 2 (two) times daily., Disp: 180 tablet, Rfl: 1   sodium chloride (MURO 128) 5 % ophthalmic solution, Place 1 drop into both eyes 3 (three) times a week., Disp: , Rfl:    sucralfate (CARAFATE) 1 g tablet, Take 1 tablet (1 g total) by mouth 3 (three) times daily. As needed, Disp: 90 tablet, Rfl: 3   traMADol (ULTRAM) 50 MG tablet, Take by mouth., Disp: , Rfl:    traMADol-acetaminophen (ULTRACET) 37.5-325 MG tablet, , Disp: , Rfl:     Allergies as of 10/01/2022 - Review Complete 10/01/2022  Allergen Reaction Noted   Clindamycin/lincomycin Other (See Comments) 10/05/2011   Doxycycline Nausea And Vomiting 10/05/2011   Escitalopram oxalate Nausea Only 10/05/2011   Levsinex timecaps [hyoscyamine] Itching    Macrodantin Other (See  Comments) 10/05/2011   Sulfa antibiotics  07/20/2020   Trazodone and nefazodone Nausea Only 10/05/2011   Trimethoprim Other (See Comments) 12/09/2013   Epinephrine Palpitations 08/23/2019   Lincomycin Rash 10/05/2011   Nitrofurantoin macrocrystal Anxiety and Rash     Past Medical History:  Diagnosis Date   Anxiety    Psychiatry Dr. Lucie Leather, concerns for memory loss, anxiety   Broken heart syndrome    Cancer    bladder CA   Coronary atherosclerosis CARDIOLOGIST-  DR Verdis Prime   Cath 11/2006 demonstrating moderate obstructive coronary disease with 70-80% septal perforator #1, 50-70% first diagonal, 80% third diagonal, 50% mid and distal LAD & heavy calcification throughout all 3 coronaries. LVF normal.   Essential hypertension, benign    Fatty liver 04/28/2012   GERD (gastroesophageal reflux disease)    Heart murmur    History of esophageal spasm    takes ranexa   History of  TIA (transient ischemic attack)    2011--  no residuals   Hyperlipidemia    IBS (irritable bowel syndrome)    Diarrhea predominent, esophageal spasm severe, GERD - Dr. Julio Alm   Insomnia    Interstitial cystitis    Dr. Patsi Sears   Memory loss    Psychiatry Dr. Lucie Leather, concerns for memory loss, anxiety   PONV (postoperative nausea and vomiting)    AND URINARY RETENTION   TMJ (temporomandibular joint syndrome)    Wears glasses     Past Surgical History:  Procedure Laterality Date   ABDOMINAL HYSTERECTOMY  AGE 81 -- 1989   APPENDECTOMY  1971   AND OVARIAN CYSTECTOMY   BENIGN EXCISIONAL BREAST BX  1996   BREAST EXCISIONAL BIOPSY Right 1992   CARDIAC CATHETERIZATION  11-28-2006   DR Sherilyn Cooter SMITH    moderate cad/  70-80% septal perforator #1, 50-70% first diagonal, 80% third diagonal, 50% mid and distal LAD & heavy calcification throughout all 3 coronaries. LVF normal.   CARDIAC CATHETERIZATION  03-26-2012   DR Verdis Prime   patent CFX,  RCA,  & pLAD/  mLAD >50% to <70% a focal eccentric  region that overlaps the third diagonal/  90% diagonal ostial and unchanged from prior study /   ef 65%   CARPAL TUNNEL RELEASE Bilateral    CYSTO WITH HYDRODISTENSION N/A 01/14/2014   Procedure: CYSTOSCOPY/HYDRODISTENSION/INSERTION OF MARCAINE AND PYRIDUIM INTO BLADDER/INJECTION OF MARCAINE AND KENALOG into sub trigone space ;  Surgeon: Kathi Ludwig, MD;  Location: [facility];  Service: Urology;  Laterality: N/A;   CYSTO/  HYDRODISTENTION/  INSTILLATION THERAPY  03/12/2010   ESOPHAGOGASTRODUODENOSCOPY  04/30/2012   Procedure: ESOPHAGOGASTRODUODENOSCOPY (EGD);  Surgeon: Hart Carwin, MD;  Location: [facility];  Service: Endoscopy;  Laterality: N/A;   EYE SURGERY Bilateral 2022   KNEE ARTHROSCOPY WITH MEDIAL MENISECTOMY Left 07/12/2021   Procedure: KNEE ARTHROSCOPY WITH PARTIAL MEDIAL MENISECTOMY, CHONDROPLASTY;  Surgeon: Durene Romans, MD;  Location: [facility];  Service: Orthopedics;  Laterality: Left;   LEFT HEART CATH AND CORONARY ANGIOGRAPHY N/A 02/18/2017   Procedure: LEFT HEART CATH AND CORONARY ANGIOGRAPHY;  Surgeon: Lyn Records, MD;  Location: [facility];  Service: Cardiovascular;  Laterality: N/A;   LUMBAR DISC SURGERY  1973   SHOULDER ARTHROSCOPY Left 12/23/2011   SHOULDER SURGERY Right 2010   TRANSTHORACIC ECHOCARDIOGRAM  04/21/2012   GRADE I DIASTOLIC DYSFUNCTION/  EF 60-65%/  MILD MR  &  TR   TRANSURETHRAL RESECTION OF BLADDER TUMOR Right 01/03/2015   Procedure: TRANSURETHRAL RESECTION OF BLADDER TUMOR (TURBT);  Surgeon: Jethro Bolus, MD;  Location: [facility];  Service: Urology;  Laterality: Right;   TRIGGER FINGER RELEASE Bilateral 2017    Family History  Problem Relation Age of Onset   Heart disease Mother    Colon polyps Mother    Alzheimer's disease Mother    Dementia Mother    CAD Mother    Heart disease Father    Diverticulosis Father    CAD Father        in his 37s   Heart disease Brother    Osteoarthritis  Other        Multiple family members   Obesity Other        Multiple family members   Depression Other        Multiple family members   Breast cancer Sister 41       Pam   Colon cancer Neg  Hx    Esophageal cancer Neg Hx    Stomach cancer Neg Hx    Rectal cancer Neg Hx     Social History   Socioeconomic History   Marital status: Married    Spouse name: Not on file   Number of children: 2   Years of education: Not on file   Highest education level: Not on file  Occupational History   Occupation: caregiver  Tobacco Use   Smoking status: Former    Packs/day: 1.00    Years: 10.00    Additional pack years: 0.00    Total pack years: 10.00    Types: Cigarettes    Quit date: 06/10/1973    Years since quitting: 49.3   Smokeless tobacco: Never  Vaping Use   Vaping Use: Never used  Substance and Sexual Activity   Alcohol use: Yes    Alcohol/week: 2.0 standard drinks of alcohol    Types: 2 Glasses of wine per week    Comment: social   Drug use: No   Sexual activity: Yes  Other Topics Concern   Not on file  Social History Narrative   Not on file   Social Determinants of Health   Financial Resource Strain: Not on file  Food Insecurity: Not on file  Transportation Needs: Not on file  Physical Activity: Not on file  Stress: Not on file  Social Connections: Not on file  Intimate Partner Violence: Not on file      Review of systems: Review of Systems  Constitutional:  Negative for unexpected weight change.  HENT:  Negative for trouble swallowing.   Gastrointestinal:  Positive for constipation. Negative for abdominal distention, abdominal pain, anal bleeding, blood in stool, diarrhea, nausea, rectal pain and vomiting.     Physical Exam: General: well-appearing   Eyes: sclera anicteric, no redness ENT: oral mucosa moist without lesions, no cervical or supraclavicular lymphadenopathy CV: RRR, no JVD, no peripheral edema Resp: clear to auscultation bilaterally, normal  RR and effort noted GI: soft, no tenderness, with active bowel sounds. No guarding or palpable organomegaly noted. Skin; warm and dry, no rash or jaundice noted Neuro: awake, alert and oriented x 3. Normal gross motor function and fluent speech   Data Reviewed:  Reviewed labs, radiology imaging, old records and pertinent past GI work up   Assessment and Plan/Recommendations:  72 year old very pleasant female with history of esophageal spasms, GERD, bladder cancer 2016, CAD s/p TIA complains of epigastric pain, generalized abdominal discomfort and IBS constipation  Epigastric abdominal pain:  CT abdomen pelvis showed antral wall thickening, will plan to proceed with EGD for further evaluation and obtain biopsies if needed The risks and benefits as well as alternatives of endoscopic procedure(s) have been discussed and reviewed. All questions answered. The patient agrees to proceed.   GERD: Continue Nexium and antireflux measures   Generalized abdominal discomfort associated with IBS constipation: Continue with increased water intake and dietary fiber.   Increased stool burden noted on CT abdomen pelvis, advised patient to do bowel purge with MiraLAX followed by Linzess 72 mcg daily, advised patient to take it anytime of the day, it is better if she can take it consistently but given her lifestyle she will still benefit to use it anytime during the day except at bedtime so she does not get interruption of her sleep with bowel movement  Due for surveillance colonoscopy in October 2014, is average risk for colorectal cancer  The patient was provided an opportunity to ask  questions and all were answered. The patient agreed with the plan and demonstrated an understanding of the instructions.  Iona Beard , MD    CC: Collene Mares, [place]  Z,OXWR Judie Petit Kadhim,acting as a scribe for Marsa Aris, MD.,have documented all relevant documentation on the behalf of Marsa Aris, MD,as  directed by  Marsa Aris, MD while in the presence of Marsa Aris, MD.   I, Marsa Aris, MD, have reviewed all documentation for this visit. The documentation on 10/01/22 for the exam, diagnosis, procedures, and orders are all accurate and complete.

## 2022-09-26 DIAGNOSIS — D2372 Other benign neoplasm of skin of left lower limb, including hip: Secondary | ICD-10-CM | POA: Diagnosis not present

## 2022-09-26 DIAGNOSIS — L578 Other skin changes due to chronic exposure to nonionizing radiation: Secondary | ICD-10-CM | POA: Diagnosis not present

## 2022-09-26 DIAGNOSIS — L821 Other seborrheic keratosis: Secondary | ICD-10-CM | POA: Diagnosis not present

## 2022-09-26 DIAGNOSIS — D225 Melanocytic nevi of trunk: Secondary | ICD-10-CM | POA: Diagnosis not present

## 2022-10-01 ENCOUNTER — Encounter: Payer: Self-pay | Admitting: Gastroenterology

## 2022-10-01 ENCOUNTER — Ambulatory Visit: Payer: Medicare Other | Admitting: Gastroenterology

## 2022-10-01 VITALS — BP 124/62 | HR 67 | Ht 62.2 in | Wt 130.0 lb

## 2022-10-01 DIAGNOSIS — K5904 Chronic idiopathic constipation: Secondary | ICD-10-CM | POA: Diagnosis not present

## 2022-10-01 DIAGNOSIS — R1013 Epigastric pain: Secondary | ICD-10-CM

## 2022-10-01 DIAGNOSIS — R933 Abnormal findings on diagnostic imaging of other parts of digestive tract: Secondary | ICD-10-CM | POA: Diagnosis not present

## 2022-10-01 MED ORDER — LINACLOTIDE 72 MCG PO CAPS: 72.0000 ug | ORAL_CAPSULE | Freq: Every day | ORAL | 3 refills | Status: DC

## 2022-10-01 NOTE — Patient Instructions (Signed)
You have been scheduled for an endoscopy. Please follow written instructions given to you at your visit today. If you use inhalers (even only as needed), please bring them with you on the day of your procedure.   Dr Lavon Paganini recommends that you complete a bowel purge (to clean out your bowels). Please do the following: Purchase a bottle of Miralax over the counter as well as a box of 5 mg dulcolax tablets. Take 4 dulcolax tablets. Wait 1 hour. You will then drink 6-8 capfuls of Miralax mixed in an adequate amount of water/juice/gatorade (you may choose which of these liquids to drink) over the next 2-3 hours. You should expect results within 1 to 6 hours after completing the bowel purge.   We have sent the following medications to your pharmacy for you to pick up at your convenience:  Linzess 72 mcg  Due to recent changes in healthcare laws, you may see the results of your imaging and laboratory studies on MyChart before your provider has had a chance to review them.  We understand that in some cases there may be results that are confusing or concerning to you. Not all laboratory results come back in the same time frame and the provider may be waiting for multiple results in order to interpret others.  Please give [place] 48 hours in order for your provider to thoroughly review all the results before contacting the office for clarification of your results.    I appreciate the  opportunity to care for you  Thank You   Marsa Aris , MD

## 2022-10-04 MED ORDER — RANOLAZINE ER 500 MG PO TB12: 500.0000 mg | ORAL_TABLET | Freq: Two times a day (BID) | ORAL | 1 refills | Status: DC

## 2022-10-07 ENCOUNTER — Ambulatory Visit (AMBULATORY_SURGERY_CENTER): Payer: Medicare Other | Admitting: Gastroenterology

## 2022-10-07 ENCOUNTER — Encounter: Payer: Self-pay | Admitting: Gastroenterology

## 2022-10-07 VITALS — BP 139/63 | HR 56 | Temp 98.0°F | Resp 11 | Ht 62.0 in | Wt 130.0 lb

## 2022-10-07 DIAGNOSIS — K2970 Gastritis, unspecified, without bleeding: Secondary | ICD-10-CM

## 2022-10-07 DIAGNOSIS — R1013 Epigastric pain: Secondary | ICD-10-CM

## 2022-10-07 DIAGNOSIS — K2100 Gastro-esophageal reflux disease with esophagitis, without bleeding: Secondary | ICD-10-CM | POA: Diagnosis not present

## 2022-10-07 DIAGNOSIS — K3189 Other diseases of stomach and duodenum: Secondary | ICD-10-CM | POA: Diagnosis not present

## 2022-10-07 MED ORDER — SODIUM CHLORIDE 0.9 % IV SOLN
500.0000 mL | Freq: Once | INTRAVENOUS | Status: DC
Start: 2022-10-07 — End: 2022-10-07

## 2022-10-07 NOTE — Progress Notes (Signed)
Called to room to assist during endoscopic procedure.  Patient ID and intended procedure confirmed with present staff. Received instructions for my participation in the procedure from the performing physician.  

## 2022-10-07 NOTE — Op Note (Signed)
[facility] Patient Name: [name] Procedure Date: 10/07/2022 11:03 AM MRN: [id number] Endoscopist: Napoleon Form , MD, [id number] Age: 72 Referring MD:  Date of Birth: 1950/11/23 Gender: Female Account #: [id number] Procedure:                Upper GI endoscopy Indications:              Epigastric abdominal pain, Abnormal CT of the GI                            tract (Thickening of gastric antrum r/o neoplasia) Medicines:                Propofol per Anesthesia Procedure:                Pre-Anesthesia Assessment:                           - Prior to the procedure, a History and Physical                            was performed, and patient medications and                            allergies were reviewed. The patient's tolerance of                            previous anesthesia was also reviewed. The risks                            and benefits of the procedure and the sedation                            options and risks were discussed with the patient.                            All questions were answered, and informed consent                            was obtained. Prior Anticoagulants: The patient has                            taken no anticoagulant or antiplatelet agents. ASA                            Grade Assessment: II - A patient with mild systemic                            disease. After reviewing the risks and benefits,                            the patient was deemed in satisfactory condition to                            undergo the procedure.  After obtaining informed consent, the endoscope was                            passed under direct vision. Throughout the                            procedure, the patient's blood pressure, pulse, and                            oxygen saturations were monitored continuously. The                            GIF [id number] #[id number] was introduced through the                             mouth, and advanced to the second part of duodenum.                            The upper GI endoscopy was accomplished without                            difficulty. The patient tolerated the procedure                            well. Scope In: Scope Out: Findings:                 LA Grade A (one or more mucosal breaks less than 5                            mm, not extending between tops of 2 mucosal folds)                            esophagitis with no bleeding was found 30 to 38 cm                            from the incisors.                           A 2 cm hiatal hernia was present.                           Patchy mild inflammation characterized by                            congestion (edema), erythema, friability, mucus and                            nodularity was found in the entire examined                            stomach. Biopsies were taken with a cold forceps  for histology. Biopsies were taken with a cold                            forceps for Helicobacter pylori testing.                           The cardia and gastric fundus were normal on                            retroflexion.                           The examined duodenum was normal. Complications:            No immediate complications. Estimated Blood Loss:     Estimated blood loss was minimal. Impression:               - LA Grade A reflux esophagitis with no bleeding.                           - 2 cm hiatal hernia.                           - Gastritis. Biopsied.                           - Normal examined duodenum. Recommendation:           - Patient has a contact number available for                            emergencies. The signs and symptoms of potential                            delayed complications were discussed with the                            patient. Return to normal activities tomorrow.                            Written discharge instructions were provided to the                             patient.                           - Resume previous diet.                           - Continue present medications.                           - Await pathology results.                           - Return to GI office as previously scheduled.                           -  Follow an antireflux regimen. Napoleon Form, MD 10/07/2022 11:23:59 AM This report has been signed electronically.

## 2022-10-07 NOTE — Patient Instructions (Addendum)
Thank you for coming in to see [place] today! Resume your regular diet and medications today. Return to regular daily activities tomorrow. Pathology results will be available in 1-2 weeks.  You will be notified by phone if there is treatment needed. Please keep previously scheduled follow up appointment July 22 @ 9:45 at Dr Elana Alm office on the 3rd floor.   YOU HAD AN ENDOSCOPIC PROCEDURE TODAY AT THE [facility]:   Refer to the procedure report that was given to you for any specific questions about what was found during the examination.  If the procedure report does not answer your questions, please call your gastroenterologist to clarify.  If you requested that your care partner not be given the details of your procedure findings, then the procedure report has been included in a sealed envelope for you to review at your convenience later.  YOU SHOULD EXPECT: Some feelings of bloating in the abdomen. Passage of more gas than usual.  Walking can help get rid of the air that was put into your GI tract during the procedure and reduce the bloating. If you had a lower endoscopy (such as a colonoscopy or flexible sigmoidoscopy) you may notice spotting of blood in your stool or on the toilet paper. If you underwent a bowel prep for your procedure, you may not have a normal bowel movement for a few days.  Please Note:  You might notice some irritation and congestion in your nose or some drainage.  This is from the oxygen used during your procedure.  There is no need for concern and it should clear up in a day or so.  SYMPTOMS TO REPORT IMMEDIATELY:  Following upper endoscopy (EGD)  Vomiting of blood or coffee ground material  New chest pain or pain under the shoulder blades  Painful or persistently difficult swallowing  New shortness of breath  Fever of 100F or higher  Black, tarry-looking stools  For urgent or emergent issues, a gastroenterologist can be reached at any hour by calling  [phone]. Do not use MyChart messaging for urgent concerns.    DIET:  We do recommend a small meal at first, but then you may proceed to your regular diet.  Drink plenty of fluids but you should avoid alcoholic beverages for 24 hours.  ACTIVITY:  You should plan to take it easy for the rest of today and you should NOT DRIVE or use heavy machinery until tomorrow (because of the sedation medicines used during the test).    FOLLOW UP: Our staff will call the number listed on your records the next business day following your procedure.  We will call around 7:15- 8:00 am to check on you and address any questions or concerns that you may have regarding the information given to you following your procedure. If we do not reach you, we will leave a message.     If any biopsies were taken you will be contacted by phone or by letter within the next 1-3 weeks.  Please call us at [phone] if you have not heard about the biopsies in 3 weeks.    SIGNATURES/CONFIDENTIALITY: You and/or your care partner have signed paperwork which will be entered into your electronic medical record.  These signatures attest to the fact that that the information above on your After Visit Summary has been reviewed and is understood.  Full responsibility of the confidentiality of this discharge information lies with you and/or your care-partner.

## 2022-10-07 NOTE — Progress Notes (Signed)
Report to PACU, RN, vss, BBS= Clear.  

## 2022-10-07 NOTE — Progress Notes (Signed)
Please refer to office visit note 10/01/22. No additional changes in H&P Patient is appropriate for planned procedure(s) and anesthesia in an ambulatory setting  K. Scherry Ran , MD [phone]

## 2022-10-08 ENCOUNTER — Telehealth: Payer: Self-pay | Admitting: *Deleted

## 2022-10-08 NOTE — Telephone Encounter (Signed)
Post procedure follow up call placed, no answer and left VM.  

## 2022-10-18 ENCOUNTER — Encounter: Payer: Self-pay | Admitting: Gastroenterology

## 2022-12-04 ENCOUNTER — Other Ambulatory Visit: Payer: Self-pay | Admitting: Psychiatry

## 2022-12-04 DIAGNOSIS — F5101 Primary insomnia: Secondary | ICD-10-CM

## 2022-12-17 DIAGNOSIS — H18513 Endothelial corneal dystrophy, bilateral: Secondary | ICD-10-CM | POA: Diagnosis not present

## 2022-12-17 DIAGNOSIS — H43813 Vitreous degeneration, bilateral: Secondary | ICD-10-CM | POA: Diagnosis not present

## 2022-12-18 ENCOUNTER — Ambulatory Visit
Admission: RE | Admit: 2022-12-18 | Discharge: 2022-12-18 | Disposition: A | Discharge status: Home / Self Care | Payer: Medicare Other | Source: Ambulatory Visit | Attending: Internal Medicine | Admitting: Internal Medicine

## 2022-12-18 ENCOUNTER — Other Ambulatory Visit: Payer: Self-pay | Admitting: Internal Medicine

## 2022-12-18 DIAGNOSIS — I700 Atherosclerosis of aorta: Secondary | ICD-10-CM | POA: Diagnosis not present

## 2022-12-18 DIAGNOSIS — R052 Subacute cough: Secondary | ICD-10-CM

## 2022-12-18 DIAGNOSIS — J479 Bronchiectasis, uncomplicated: Secondary | ICD-10-CM | POA: Diagnosis not present

## 2022-12-18 DIAGNOSIS — I5189 Other ill-defined heart diseases: Secondary | ICD-10-CM | POA: Diagnosis not present

## 2022-12-18 DIAGNOSIS — I25118 Atherosclerotic heart disease of native coronary artery with other forms of angina pectoris: Secondary | ICD-10-CM | POA: Diagnosis not present

## 2022-12-23 ENCOUNTER — Telehealth: Payer: Self-pay | Admitting: Gastroenterology

## 2022-12-23 ENCOUNTER — Ambulatory Visit
Admission: RE | Admit: 2022-12-23 | Discharge: 2022-12-23 | Disposition: A | Discharge status: Home / Self Care | Payer: Medicare Other | Source: Ambulatory Visit | Attending: Internal Medicine | Admitting: Internal Medicine

## 2022-12-23 ENCOUNTER — Encounter: Payer: Self-pay | Admitting: Gastroenterology

## 2022-12-23 DIAGNOSIS — J479 Bronchiectasis, uncomplicated: Secondary | ICD-10-CM | POA: Diagnosis not present

## 2022-12-23 DIAGNOSIS — I700 Atherosclerosis of aorta: Secondary | ICD-10-CM | POA: Diagnosis not present

## 2022-12-23 DIAGNOSIS — R911 Solitary pulmonary nodule: Secondary | ICD-10-CM

## 2022-12-23 DIAGNOSIS — R059 Cough, unspecified: Secondary | ICD-10-CM | POA: Diagnosis not present

## 2022-12-23 NOTE — Telephone Encounter (Signed)
Called patient. No answer. Left message of my call.

## 2022-12-23 NOTE — Telephone Encounter (Signed)
Given diarrhea has resolved she can resume taking Linzess but hold taking it if she is still having loose stool or watery diarrhea

## 2022-12-23 NOTE — Telephone Encounter (Signed)
Patient went to the movie theater, ate popcorn a week ago. The next 4 days she had cramping, diarrhea and nausea without vomiting. She took Pepto-bismol without relief. Afebrile.  Yesterday and today she has had soft bowel movements less than 3 a day with continued nausea. She was able to go to the gym for a workout. She is hungry. She does not have any tenderness with walking or pushing her abdomen. Should she take Linzess? She asks for your recommendations.

## 2022-12-23 NOTE — Telephone Encounter (Signed)
Spoke with the patient. Recommendation given. She will call us back if she acutely worsens or fails to improve.

## 2022-12-23 NOTE — Telephone Encounter (Signed)
Received MyChart message frfom patient stating he is in "an emergency situation and rather not go to the hospital."  He ate popcorn on Tuesday and has been in pain, diarrhea, horrible cramping, and bloating.  Now, he is not pooping but still cramping, etc. And is afraid the shape his colon is in that constipation won't be good.  He is asking what he can do.  He has an appointment scheduled with Dr. Lavon Paganini 12/30/22.  Please call patient and advise.  Thank you.

## 2022-12-24 NOTE — Progress Notes (Signed)
[name]    [id number]    Feb 15, 1951  Primary Care Physician:Miller, Deloria Lair, [place]  Referring Physician: Collene Mares, PA [address] [place],  [place] [address]   Chief complaint: Abdominal pain, constipation Chief Complaint  Patient presents with   Abdominal Pain    Last week had lower abd pains and diarrhea, alternating from epigastric and lower abd pains    HPI: 72 year old very pleasant female here for follow-up visit with complaints of abdominal pain and constipation  Last seen on 10-01-22.  Today, she complains of abdominal pain accompanied by diarrhea. She states that she had a sever diarrhea episode. She states that shad an explosive BMs with dark and watery, soft stool. Prior to her episode, she had buttery popcorn the night before. Her episodes lasted for 6-7 days then was resolved. Currently, she has not had as many BM since her episode. She is typically having a BM once every 3-4 days. She states that she took Linzess 145 mcg last night to induce a BM and states that she typically takes it 1-2x a week.   Patient denies any nausea, blood in stool, black stool, vomiting, bloating, unintentional weight loss, reflux, dysphagia.  GI Hx:  EGD 10-07-22 - LA Grade A reflux esophagitis with no bleeding.  - 2 cm hiatal hernia.  - Gastritis. Biopsied.  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 - HELICOBACTER PYLORI-LIKE ORGANISMS ARE NOT IDENTIFIED ON ROUTINE H&E STAIN  CT Abdomen Pelvis w contrast 09-16-22 1. Mild pre pyloric gastric wall thickening, can be seen with gastritis or peptic ulcer disease. 2. Moderate colonic stool burden with fecalization of distal small bowel contents, suggesting slow transit/constipation. No bowel obstruction or inflammation. 3. Mild hepatic steatosis. 4. Tiny fat  containing supraumbilical ventral abdominal wall hernia.  US Abdomen 08-01-22 1. No acute abnormality identified. 2. Mild diffuse increased echotexture of the liver. This is nonspecific but can be seen in fatty infiltration of liver.  EGD April 28, 2019: Normal esophagus.  Regular Z-line.  Gastritis biopsies negative for H. Pylori.     TTG IgA antibody negative for celiac   Colonoscopy April 07, 2013 showed internal hemorrhoids otherwise normal exam   Flexible sigmoidoscopy January 22, 2016 for persistent anorectal/pelvic pain was unremarkable other than internal hemorrhoids   Abdominal ultrasound January 27, 2020: Unremarkable   CT abdomen with contrast February 09, 2020: Large colonic stool burden otherwise no acute findings.  Additional chronic findings were noted in the report including emphysema, osteopenia and atherosclerosis   Current Outpatient Medications:    acyclovir (ZOVIRAX) 400 MG tablet, Take 400 mg by mouth as needed., Disp: , Rfl:    ARTIFICIAL TEAR SOLUTION OP, Place 1 drop into both eyes 2 (two) times daily., Disp: , Rfl:    Aspirin 81 MG CAPS, Take 81 mg by mouth daily., Disp: , Rfl:    atorvastatin (LIPITOR) 40 MG tablet, Take 1 tablet (40 mg total) by mouth daily., Disp: 90 tablet, Rfl: 1   Cholecalciferol (VITAMIN D) 50 MCG (2000 UT) tablet, Take 2,000 Units by mouth daily., Disp: , Rfl:    cyclobenzaprine (FLEXERIL) 10 MG tablet, Take 10 mg by mouth daily as needed for muscle spasms., Disp: , Rfl:    dicyclomine (BENTYL) 10 MG capsule, TAKE 1 CAPSULE TWICE DAILY FOR SPASM(S)  AS NEEDED, Disp: 180 capsule, Rfl: 3   Digestive Enzymes (MULTI-ENZYME) TABS, Take 1 tablet by mouth daily as needed (acidic food/beans/broccoli)., Disp: , Rfl:    diltiazem 2 % GEL, Apply 1 Application topically as needed. For rectal spasm, Disp: 30 g, Rfl: 1   ezetimibe (ZETIA) 10 MG tablet, Take 1 tablet (10 mg total) by mouth daily., Disp: 90 tablet, Rfl: 3   fluticasone (FLONASE) 50  MCG/ACT nasal spray, 1 spray as needed., Disp: , Rfl:    hyoscyamine (LEVSIN SL) 0.125 MG SL tablet, DISSOLVE 1 TABLET UNDER THE TONGUE EVERY DAY AS NEEDED, Disp: 90 tablet, Rfl: 3   linaclotide (LINZESS) 72 MCG capsule, Take 1 capsule (72 mcg total) by mouth daily before breakfast. (Patient taking differently: Take 72 mcg by mouth as needed.), Disp: 30 capsule, Rfl: 3   nebivolol (BYSTOLIC) 2.5 MG tablet, Take 1 tablet (2.5 mg total) by mouth daily., Disp: 90 tablet, Rfl: 3   NEXIUM 40 MG capsule, BRAND NAME ONLY- take 1 capsule daily as directed, Disp: 90 capsule, Rfl: 3   nitroGLYCERIN (NITROSTAT) 0.4 MG SL tablet, place 1 tablet under the tongue if needed every 5 minutes for chest pain for 3 doses IF NO RELIEF AFTER 3RD DOSE CALL PRESCRIBER OR 911., Disp: 25 tablet, Rfl: 3   Peppermint Oil 90 MG CPCR, Take 2 capsules by mouth as needed (indigestion)., Disp: , Rfl:    Probiotic Product (PROBIOTIC PO), Take 1 capsule by mouth daily., Disp: , Rfl:    QUEtiapine (SEROQUEL) 25 MG tablet, TAKE 1 TABLET AT BEDTIME, Disp: 90 tablet, Rfl: 0   ranolazine (RANEXA) 500 MG 12 hr tablet, Take 1 tablet (500 mg total) by mouth 2 (two) times daily., Disp: 180 tablet, Rfl: 1   sucralfate (CARAFATE) 1 g tablet, Take 1 tablet (1 g total) by mouth 3 (three) times daily. As needed, Disp: 90 tablet, Rfl: 3   traMADol (ULTRAM) 50 MG tablet, Take by mouth., Disp: , Rfl:     Allergies as of 12/30/2022 - Review Complete 12/30/2022  Allergen Reaction Noted   Clindamycin/lincomycin Other (See Comments) 10/05/2011   Doxycycline Nausea And Vomiting 10/05/2011   Escitalopram oxalate Nausea Only 10/05/2011   Levsinex timecaps [hyoscyamine] Itching    Macrodantin Other (See Comments) 10/05/2011   Sulfa antibiotics  07/20/2020   Trazodone and nefazodone Nausea Only 10/05/2011   Trimethoprim Other (See Comments) 12/09/2013   Epinephrine Palpitations 08/23/2019   Lincomycin Rash 10/05/2011   Nitrofurantoin macrocrystal  Anxiety and Rash     Past Medical History:  Diagnosis Date   Anxiety    Psychiatry Dr. Lucie Leather, concerns for memory loss, anxiety   Arthritis    Broken heart syndrome    Cancer (HCC)    bladder CA   Cataract    Coronary atherosclerosis CARDIOLOGIST-  DR Verdis Prime   Cath 11/2006 demonstrating moderate obstructive coronary disease with 70-80% septal perforator #1, 50-70% first diagonal, 80% third diagonal, 50% mid and distal LAD & heavy calcification throughout all 3 coronaries. LVF normal.   Essential hypertension, benign    Fatty liver 04/28/2012   GERD (gastroesophageal reflux disease)    Heart murmur    History of esophageal spasm    takes ranexa   History of TIA (transient ischemic attack)    2011--  no residuals   Hyperlipidemia    IBS (irritable bowel syndrome)    Diarrhea predominent, esophageal spasm severe, GERD - Dr. Julio Alm   Insomnia    Interstitial  cystitis    Dr. Patsi Sears   Memory loss    Psychiatry Dr. Lucie Leather, concerns for memory loss, anxiety   PONV (postoperative nausea and vomiting)    AND URINARY RETENTION   TMJ (temporomandibular joint syndrome)    Wears glasses     Past Surgical History:  Procedure Laterality Date   ABDOMINAL HYSTERECTOMY  AGE 42 -- 1989   APPENDECTOMY  1971   AND OVARIAN CYSTECTOMY   BENIGN EXCISIONAL BREAST BX  1996   BREAST EXCISIONAL BIOPSY Right 1992   CARDIAC CATHETERIZATION  11-28-2006   DR Verdis Prime    moderate cad/  70-80% septal perforator #1, 50-70% first diagonal, 80% third diagonal, 50% mid and distal LAD & heavy calcification throughout all 3 coronaries. LVF normal.   CARDIAC CATHETERIZATION  03-26-2012   DR Verdis Prime   patent CFX,  RCA,  & pLAD/  mLAD >50% to <70% a focal eccentric region that overlaps the third diagonal/  90% diagonal ostial and unchanged from prior study /   ef 65%   CARPAL TUNNEL RELEASE Bilateral    CYSTO WITH HYDRODISTENSION N/A 01/14/2014   Procedure:  CYSTOSCOPY/HYDRODISTENSION/INSERTION OF MARCAINE AND PYRIDUIM INTO BLADDER/INJECTION OF MARCAINE AND KENALOG into sub trigone space ;  Surgeon: Kathi Ludwig, MD;  Location: [facility];  Service: Urology;  Laterality: N/A;   CYSTO/  HYDRODISTENTION/  INSTILLATION THERAPY  03/12/2010   ESOPHAGOGASTRODUODENOSCOPY  04/30/2012   Procedure: ESOPHAGOGASTRODUODENOSCOPY (EGD);  Surgeon: Hart Carwin, MD;  Location: [facility];  Service: Endoscopy;  Laterality: N/A;   EYE SURGERY Bilateral 2022   KNEE ARTHROSCOPY WITH MEDIAL MENISECTOMY Left 07/12/2021   Procedure: KNEE ARTHROSCOPY WITH PARTIAL MEDIAL MENISECTOMY, CHONDROPLASTY;  Surgeon: Durene Romans, MD;  Location: [facility];  Service: Orthopedics;  Laterality: Left;   LEFT HEART CATH AND CORONARY ANGIOGRAPHY N/A 02/18/2017   Procedure: LEFT HEART CATH AND CORONARY ANGIOGRAPHY;  Surgeon: Lyn Records, MD;  Location: [facility];  Service: Cardiovascular;  Laterality: N/A;   LUMBAR DISC SURGERY  1973   SHOULDER ARTHROSCOPY Left 12/23/2011   SHOULDER SURGERY Right 2010   TRANSTHORACIC ECHOCARDIOGRAM  04/21/2012   GRADE I DIASTOLIC DYSFUNCTION/  EF 60-65%/  MILD MR  &  TR   TRANSURETHRAL RESECTION OF BLADDER TUMOR Right 01/03/2015   Procedure: TRANSURETHRAL RESECTION OF BLADDER TUMOR (TURBT);  Surgeon: Jethro Bolus, MD;  Location: [facility];  Service: Urology;  Laterality: Right;   TRIGGER FINGER RELEASE Bilateral 2017    Family History  Problem Relation Age of Onset   Heart disease Mother    Colon polyps Mother    Alzheimer's disease Mother    Dementia Mother    CAD Mother    Heart disease Father    Diverticulosis Father    CAD Father        in his 69s   Heart disease Brother    Osteoarthritis Other        Multiple family members   Obesity Other        Multiple family members   Depression Other        Multiple family members   Breast cancer Sister 38       Pam   Colon cancer Neg Hx     Esophageal cancer Neg Hx    Stomach cancer Neg Hx    Rectal cancer Neg Hx     Social History   Socioeconomic History   Marital status: Married    Spouse  name: Not on file   Number of children: 2   Years of education: Not on file   Highest education level: Not on file  Occupational History   Occupation: caregiver  Tobacco Use   Smoking status: Former    Current packs/day: 0.00    Average packs/day: 1 pack/day for 10.0 years (10.0 ttl pk-yrs)    Types: Cigarettes    Start date: 06/11/1963    Quit date: 06/10/1973    Years since quitting: 49.5   Smokeless tobacco: Never  Vaping Use   Vaping status: Never Used  Substance and Sexual Activity   Alcohol use: Yes    Alcohol/week: 2.0 standard drinks of alcohol    Types: 2 Glasses of wine per week    Comment: social   Drug use: No   Sexual activity: Yes  Other Topics Concern   Not on file  Social History Narrative   Not on file   Social Determinants of Health   Financial Resource Strain: Not on file  Food Insecurity: Not on file  Transportation Needs: Not on file  Physical Activity: Not on file  Stress: Not on file  Social Connections: Not on file  Intimate Partner Violence: Not on file      Review of systems: Review of Systems  Constitutional:  Negative for unexpected weight change.  HENT:  Negative for trouble swallowing.   Gastrointestinal:  Positive for abdominal pain, constipation and diarrhea. Negative for abdominal distention, anal bleeding, blood in stool, nausea, rectal pain and vomiting.     Physical Exam: General: well-appearing   Eyes: sclera anicteric, no redness ENT: oral mucosa moist without lesions, no cervical or supraclavicular lymphadenopathy CV: RRR, no JVD, no peripheral edema Resp: clear to auscultation bilaterally, normal RR and effort noted GI: soft, no tenderness, with active bowel sounds. No guarding or palpable organomegaly noted. Skin; warm and dry, no rash or jaundice noted Neuro:  awake, alert and oriented x 3. Normal gross motor function and fluent speech   Data Reviewed:  Reviewed labs, radiology imaging, old records and pertinent past GI work up   Assessment and Plan/Recommendations:  72 year old very pleasant female with history of esophageal spasms, GERD, bladder cancer 2016, CAD s/p TIA complains of epigastric pain, generalized abdominal discomfort and IBS constipation  Epigastric abdominal pain:  CT abdomen pelvis showed antral wall thickening, will plan to proceed with EGD for further evaluation and obtain biopsies if needed The risks and benefits as well as alternatives of endoscopic procedure(s) have been discussed and reviewed. All questions answered. The patient agrees to proceed.   GERD: Continue Nexium and antireflux measures   Generalized abdominal discomfort associated with IBS constipation: Continue with increased water intake and dietary fiber.   Increased stool burden noted on CT abdomen pelvis, advised patient to do bowel purge with MiraLAX followed by Linzess 72 mcg daily, advised patient to take it anytime of the day, it is better if she can take it consistently but given her lifestyle she will still benefit to use it anytime during the day except at bedtime so she does not get interruption of her sleep with bowel movement  Due for surveillance colonoscopy in October 2014, is average risk for colorectal cancer  -Colonoscopy  -Advised to use Linzess 145 mch every other day  The patient was provided an opportunity to ask questions and all were answered. The patient agreed with the plan and demonstrated an understanding of the instructions.  Iona Beard , MD  CC: [place], [place], [place]  G,UYQI M Kadhim,acting as a scribe for Marsa Aris, MD.,have documented all relevant documentation on the behalf of Marsa Aris, MD,as directed by  Marsa Aris, MD while in the presence of Marsa Aris, MD.   I, Marsa Aris, MD, have reviewed all documentation for this visit. The documentation on 12/30/22 for the exam, diagnosis, procedures, and orders are all accurate and complete.

## 2022-12-25 ENCOUNTER — Ambulatory Visit (HOSPITAL_BASED_OUTPATIENT_CLINIC_OR_DEPARTMENT_OTHER): Payer: Medicare Other

## 2022-12-27 ENCOUNTER — Other Ambulatory Visit: Payer: Self-pay | Admitting: Internal Medicine

## 2022-12-27 ENCOUNTER — Encounter: Payer: Self-pay | Admitting: Internal Medicine

## 2022-12-27 DIAGNOSIS — Z1231 Encounter for screening mammogram for malignant neoplasm of breast: Secondary | ICD-10-CM

## 2022-12-30 ENCOUNTER — Ambulatory Visit: Payer: Medicare Other | Admitting: Gastroenterology

## 2022-12-30 ENCOUNTER — Encounter: Payer: Self-pay | Admitting: Gastroenterology

## 2022-12-30 VITALS — BP 128/60 | HR 60 | Ht 62.5 in | Wt 130.5 lb

## 2022-12-30 DIAGNOSIS — M65321 Trigger finger, right index finger: Secondary | ICD-10-CM | POA: Diagnosis not present

## 2022-12-30 DIAGNOSIS — K582 Mixed irritable bowel syndrome: Secondary | ICD-10-CM

## 2022-12-30 DIAGNOSIS — Z1211 Encounter for screening for malignant neoplasm of colon: Secondary | ICD-10-CM | POA: Diagnosis not present

## 2022-12-30 DIAGNOSIS — M180 Bilateral primary osteoarthritis of first carpometacarpal joints: Secondary | ICD-10-CM | POA: Diagnosis not present

## 2022-12-30 DIAGNOSIS — M65342 Trigger finger, left ring finger: Secondary | ICD-10-CM | POA: Diagnosis not present

## 2022-12-30 MED ORDER — NA SULFATE-K SULFATE-MG SULF 17.5-3.13-1.6 GM/177ML PO SOLN: 1.0000 | Freq: Once | ORAL | 0 refills | Status: AC

## 2022-12-30 MED ORDER — NA SULFATE-K SULFATE-MG SULF 17.5-3.13-1.6 GM/177ML PO SOLN: 1.0000 | Freq: Once | ORAL | 0 refills | Status: DC

## 2022-12-30 NOTE — Patient Instructions (Signed)
You have been scheduled for an endoscopy. Please follow written instructions given to you at your visit today.  If you use inhalers (even only as needed), please bring them with you on the day of your procedure.  If you take any of the following medications, they will need to be adjusted prior to your procedure:   DO NOT TAKE 7 DAYS PRIOR TO TEST- Trulicity (dulaglutide) Ozempic, Wegovy (semaglutide) Mounjaro (tirzepatide) Bydureon Bcise (exanatide extended release)  DO NOT TAKE 1 DAY PRIOR TO YOUR TEST Rybelsus (semaglutide) Adlyxin (lixisenatide) Victoza (liraglutide) Byetta (exanatide) ___________________________________________________________________________   Due to recent changes in healthcare laws, you may see the results of your imaging and laboratory studies on MyChart before your provider has had a chance to review them.  We understand that in some cases there may be results that are confusing or concerning to you. Not all laboratory results come back in the same time frame and the provider may be waiting for multiple results in order to interpret others.  Please give [place] 48 hours in order for your provider to thoroughly review all the results before contacting the office for clarification of your results. \  I appreciate the  opportunity to care for you  Thank You   Marsa Aris , MD

## 2023-01-01 ENCOUNTER — Encounter: Payer: Self-pay | Admitting: Gastroenterology

## 2023-01-01 ENCOUNTER — Ambulatory Visit (AMBULATORY_SURGERY_CENTER): Payer: Medicare Other | Admitting: Gastroenterology

## 2023-01-01 VITALS — BP 139/59 | HR 56 | Temp 98.0°F | Resp 12 | Ht 62.5 in | Wt 130.0 lb

## 2023-01-01 DIAGNOSIS — D122 Benign neoplasm of ascending colon: Secondary | ICD-10-CM | POA: Diagnosis not present

## 2023-01-01 DIAGNOSIS — Z1211 Encounter for screening for malignant neoplasm of colon: Secondary | ICD-10-CM | POA: Diagnosis not present

## 2023-01-01 DIAGNOSIS — K635 Polyp of colon: Secondary | ICD-10-CM | POA: Diagnosis not present

## 2023-01-01 MED ORDER — SODIUM CHLORIDE 0.9 % IV SOLN: 500.0000 mL | Freq: Once | INTRAVENOUS | Status: DC

## 2023-01-01 NOTE — Progress Notes (Unsigned)
Report to PACU, RN, vss, BBS= Clear.  

## 2023-01-01 NOTE — Op Note (Signed)
[facility] Patient Name: [name] Procedure Date: 01/01/2023 2:36 PM MRN: [id number] Endoscopist: Napoleon Form , MD, [id number] Age: 72 Referring MD:  Date of Birth: 10/20/1950 Gender: Female Account #: [id number] Procedure:                Colonoscopy Indications:              Screening for colorectal malignant neoplasm Medicines:                Monitored Anesthesia Care Procedure:                Pre-Anesthesia Assessment:                           - Prior to the procedure, a History and Physical                            was performed, and patient medications and                            allergies were reviewed. The patient's tolerance of                            previous anesthesia was also reviewed. The risks                            and benefits of the procedure and the sedation                            options and risks were discussed with the patient.                            All questions were answered, and informed consent                            was obtained. Prior Anticoagulants: The patient has                            taken no anticoagulant or antiplatelet agents. ASA                            Grade Assessment: II - A patient with mild systemic                            disease. After reviewing the risks and benefits,                            the patient was deemed in satisfactory condition to                            undergo the procedure.                           After obtaining informed consent, the colonoscope  was passed under direct vision. Throughout the                            procedure, the patient's blood pressure, pulse, and                            oxygen saturations were monitored continuously. The                            Olympus Scope [id number] was introduced through the                            anus and advanced to the the cecum, identified by                             appendiceal orifice and ileocecal valve. The                            colonoscopy was performed without difficulty. The                            patient tolerated the procedure well. The quality                            of the bowel preparation was excellent. The                            ileocecal valve, appendiceal orifice, and rectum                            were photographed. Scope In: 2:45:17 PM Scope Out: 2:58:25 PM Scope Withdrawal Time: 0 hours 8 minutes 10 seconds  Total Procedure Duration: 0 hours 13 minutes 8 seconds  Findings:                 The perianal and digital rectal examinations were                            normal.                           Two sessile polyps were found in the ascending                            colon. The polyps were 4 to 6 mm in size. These                            polyps were removed with a cold snare. Resection                            and retrieval were complete.                           Non-bleeding internal hemorrhoids were found during  retroflexion. The hemorrhoids were small. Complications:            No immediate complications. Estimated Blood Loss:     Estimated blood loss was minimal. Impression:               - Two 4 to 6 mm polyps in the ascending colon,                            removed with a cold snare. Resected and retrieved.                           - Non-bleeding internal hemorrhoids. Recommendation:           - Patient has a contact number available for                            emergencies. The signs and symptoms of potential                            delayed complications were discussed with the                            patient. Return to normal activities tomorrow.                            Written discharge instructions were provided to the                            patient.                           - Resume previous diet.                           - Continue present  medications.                           - Await pathology results.                           - Repeat colonoscopy in 5-10 years for surveillance. Napoleon Form, MD 01/01/2023 3:07:03 PM This report has been signed electronically.

## 2023-01-01 NOTE — Progress Notes (Unsigned)
Vitals-DT  Pt's states no medical or surgical changes since previsit or office visit.Dr is aware, and   Admiting pt and she went into an "esophageal spasm"  States the pain is "a number 10." Pt is very anxious. Dr Lavon Paganini told me to tell her that she can't have anitro, and that the poropofol would take care of the spasm.

## 2023-01-01 NOTE — Progress Notes (Signed)
Called to room to assist during endoscopic procedure.  Patient ID and intended procedure confirmed with present staff. Received instructions for my participation in the procedure from the performing physician.  

## 2023-01-01 NOTE — Patient Instructions (Signed)
-  Handout on polyps and hemorrhoids provided -await pathology results -repeat colonoscopy for surveillance recommended. Date to be determined when pathology result become available   -Continue present medications   YOU HAD AN ENDOSCOPIC PROCEDURE TODAY AT THE [facility]:   Refer to the procedure report that was given to you for any specific questions about what was found during the examination.  If the procedure report does not answer your questions, please call your gastroenterologist to clarify.  If you requested that your care partner not be given the details of your procedure findings, then the procedure report has been included in a sealed envelope for you to review at your convenience later.  YOU SHOULD EXPECT: Some feelings of bloating in the abdomen. Passage of more gas than usual.  Walking can help get rid of the air that was put into your GI tract during the procedure and reduce the bloating. If you had a lower endoscopy (such as a colonoscopy or flexible sigmoidoscopy) you may notice spotting of blood in your stool or on the toilet paper. If you underwent a bowel prep for your procedure, you may not have a normal bowel movement for a few days.  Please Note:  You might notice some irritation and congestion in your nose or some drainage.  This is from the oxygen used during your procedure.  There is no need for concern and it should clear up in a day or so.  SYMPTOMS TO REPORT IMMEDIATELY:  Following lower endoscopy (colonoscopy or flexible sigmoidoscopy):  Excessive amounts of blood in the stool  Significant tenderness or worsening of abdominal pains  Swelling of the abdomen that is new, acute  Fever of 100F or higher   For urgent or emergent issues, a gastroenterologist can be reached at any hour by calling [phone]. Do not use MyChart messaging for urgent concerns.    DIET:  We do recommend a small meal at first, but then you may proceed to your regular diet.   Drink plenty of fluids but you should avoid alcoholic beverages for 24 hours.  ACTIVITY:  You should plan to take it easy for the rest of today and you should NOT DRIVE or use heavy machinery until tomorrow (because of the sedation medicines used during the test).    FOLLOW UP: Our staff will call the number listed on your records the next business day following your procedure.  We will call around 7:15- 8:00 am to check on you and address any questions or concerns that you may have regarding the information given to you following your procedure. If we do not reach you, we will leave a message.     If any biopsies were taken you will be contacted by phone or by letter within the next 1-3 weeks.  Please call us at [phone] if you have not heard about the biopsies in 3 weeks.    SIGNATURES/CONFIDENTIALITY: You and/or your care partner have signed paperwork which will be entered into your electronic medical record.  These signatures attest to the fact that that the information above on your After Visit Summary has been reviewed and is understood.  Full responsibility of the confidentiality of this discharge information lies with you and/or your care-partner.  

## 2023-01-01 NOTE — Progress Notes (Unsigned)
Please refer to office visit note 12/30/22. No additional changes in H&P Patient is appropriate for planned procedure(s) and anesthesia in an ambulatory setting  K. Scherry Ran , MD [phone]

## 2023-01-01 NOTE — Telephone Encounter (Signed)
Maurene Capes, CMA 01/01/2023  4:09 PM EDT     Patient returned call. She verbalized understanding of results.   She wanted to know since no nodules appeared but signs of bronchiectasis did, if she needed to have another CT to monitor any changes. Dr. Maple Hudson, can you please advise? Thanks!

## 2023-01-01 NOTE — Telephone Encounter (Signed)
PT calling again about CT results. PT concerned with synopsis. Has sent the [facility] message below and can not get in w/Dr. For some time. Just needs an explanation and has some questions.

## 2023-01-02 ENCOUNTER — Telehealth: Payer: Self-pay | Admitting: *Deleted

## 2023-01-02 NOTE — Telephone Encounter (Signed)
  Follow up Call-     01/01/2023    2:06 PM 01/01/2023    2:04 PM 10/07/2022   10:10 AM  Call back number  Post procedure Call Back phone  # [phone]  [phone]  Permission to leave phone message  Yes Yes     Patient questions:  Do you have a fever, pain , or abdominal swelling? No. Pain Score  0 *  Have you tolerated food without any problems? Yes.    Have you been able to return to your normal activities? Yes.    Do you have any questions about your discharge instructions: Diet   No. Medications  No. Follow up visit  No.  Do you have questions or concerns about your Care? No.  Actions: * If pain score is 4 or above: No action needed, pain <4.

## 2023-01-02 NOTE — Telephone Encounter (Signed)
We don't have to do anything right away. Take time to get over recent Covid or whatever you have just had.  We can get you in sometime in the next several weeks to talk about CT result and make future plans.

## 2023-01-08 ENCOUNTER — Encounter: Payer: Self-pay | Admitting: Gastroenterology

## 2023-01-08 ENCOUNTER — Ambulatory Visit
Admission: RE | Admit: 2023-01-08 | Discharge: 2023-01-08 | Disposition: A | Discharge status: Home / Self Care | Payer: Medicare Other | Source: Ambulatory Visit | Attending: Internal Medicine | Admitting: Internal Medicine

## 2023-01-08 DIAGNOSIS — Z1231 Encounter for screening mammogram for malignant neoplasm of breast: Secondary | ICD-10-CM | POA: Diagnosis not present

## 2023-01-13 DIAGNOSIS — K080 Exfoliation of teeth due to systemic causes: Secondary | ICD-10-CM | POA: Diagnosis not present

## 2023-01-24 ENCOUNTER — Other Ambulatory Visit: Payer: Self-pay

## 2023-01-24 MED ORDER — ATORVASTATIN CALCIUM 40 MG PO TABS: 40.0000 mg | ORAL_TABLET | Freq: Every day | ORAL | 2 refills | Status: DC

## 2023-02-07 ENCOUNTER — Other Ambulatory Visit: Payer: Self-pay

## 2023-02-07 ENCOUNTER — Emergency Department (HOSPITAL_BASED_OUTPATIENT_CLINIC_OR_DEPARTMENT_OTHER): Payer: Medicare Other

## 2023-02-07 ENCOUNTER — Emergency Department (HOSPITAL_COMMUNITY): Payer: Medicare Other

## 2023-02-07 ENCOUNTER — Encounter (HOSPITAL_BASED_OUTPATIENT_CLINIC_OR_DEPARTMENT_OTHER): Payer: Self-pay

## 2023-02-07 ENCOUNTER — Emergency Department (HOSPITAL_BASED_OUTPATIENT_CLINIC_OR_DEPARTMENT_OTHER)
Admission: EM | Admit: 2023-02-07 | Discharge: 2023-02-08 | Disposition: A | Discharge status: Home / Self Care | Payer: Medicare Other | Source: Home / Self Care | Attending: Emergency Medicine | Admitting: Emergency Medicine

## 2023-02-07 DIAGNOSIS — I10 Essential (primary) hypertension: Secondary | ICD-10-CM | POA: Diagnosis not present

## 2023-02-07 DIAGNOSIS — H6122 Impacted cerumen, left ear: Secondary | ICD-10-CM | POA: Diagnosis not present

## 2023-02-07 DIAGNOSIS — R519 Headache, unspecified: Secondary | ICD-10-CM | POA: Insufficient documentation

## 2023-02-07 DIAGNOSIS — I6782 Cerebral ischemia: Secondary | ICD-10-CM | POA: Diagnosis not present

## 2023-02-07 DIAGNOSIS — Z7982 Long term (current) use of aspirin: Secondary | ICD-10-CM | POA: Diagnosis not present

## 2023-02-07 DIAGNOSIS — I2510 Atherosclerotic heart disease of native coronary artery without angina pectoris: Secondary | ICD-10-CM | POA: Insufficient documentation

## 2023-02-07 DIAGNOSIS — H5711 Ocular pain, right eye: Secondary | ICD-10-CM | POA: Diagnosis not present

## 2023-02-07 DIAGNOSIS — G238 Other specified degenerative diseases of basal ganglia: Secondary | ICD-10-CM | POA: Diagnosis not present

## 2023-02-07 LAB — CBC
HCT: 38 % (ref 36.0–46.0)
Hemoglobin: 12.2 g/dL (ref 12.0–15.0)
MCH: 27.6 pg (ref 26.0–34.0)
MCHC: 32.1 g/dL (ref 30.0–36.0)
MCV: 86 fL (ref 80.0–100.0)
Platelets: 336 10*3/uL (ref 150–400)
RBC: 4.42 MIL/uL (ref 3.87–5.11)
RDW: 14.6 % (ref 11.5–15.5)
WBC: 7.2 10*3/uL (ref 4.0–10.5)
nRBC: 0 % (ref 0.0–0.2)

## 2023-02-07 LAB — BASIC METABOLIC PANEL WITH GFR
Anion gap: 10 (ref 5–15)
BUN: 16 mg/dL (ref 8–23)
CO2: 26 mmol/L (ref 22–32)
Calcium: 9.1 mg/dL (ref 8.9–10.3)
Chloride: 102 mmol/L (ref 98–111)
Creatinine, Ser: 0.93 mg/dL (ref 0.44–1.00)
GFR, Estimated: >60 mL/min
Glucose, Bld: 110 mg/dL — ABNORMAL HIGH (ref 70–99)
Potassium: 4 mmol/L (ref 3.5–5.1)
Sodium: 138 mmol/L (ref 135–145)

## 2023-02-07 LAB — SEDIMENTATION RATE: Sed Rate: 32 mm/hr — ABNORMAL HIGH (ref 0–22)

## 2023-02-07 MED ORDER — GADOBUTROL 1 MMOL/ML IV SOLN
6.0000 mL | Freq: Once | INTRAVENOUS | Status: AC | PRN
  Administered 2023-02-07: 6 mL via INTRAVENOUS

## 2023-02-07 MED ORDER — TETRACAINE HCL 0.5 % OP SOLN
2.0000 [drp] | Freq: Once | OPHTHALMIC | Status: AC
  Administered 2023-02-07: 2 [drp] via OPHTHALMIC
  Filled 2023-02-07: qty 4

## 2023-02-07 MED ORDER — LORAZEPAM 2 MG/ML IJ SOLN
1.0000 mg | Freq: Once | INTRAMUSCULAR | Status: AC
  Administered 2023-02-07: 1 mg via INTRAVENOUS
  Filled 2023-02-07: qty 1

## 2023-02-07 MED ORDER — IOHEXOL 350 MG/ML SOLN
75.0000 mL | Freq: Once | INTRAVENOUS | Status: AC | PRN
  Administered 2023-02-07: 75 mL via INTRAVENOUS

## 2023-02-07 NOTE — ED Triage Notes (Signed)
The patient is having a headache on and off for 5 days and it has moved into her right eye. No head injury.

## 2023-02-07 NOTE — ED Provider Notes (Signed)
[facility] due to a worsening headache over the last week that is been waxing and waning but seems to be the worst with any type of activity.  Related to mostly the right side of her face behind her eye.  Vital signs are reassuring.  Patient did have a sed rate of 32 today but age-adjusted is normal.  CT angio of the head and neck was negative for acute findings.  Patient transferred here for MRI/MRV.  Patient reports only minimal headache at this time.  She does request antianxiety dose of medication prior to MRI.  MRIs are negative for any acute pathology.  At this time feel the patient is stable for discharge home with follow-up with PCP and was given neurology information if the headache continues.  She will also follow-up with her eye doctor.   Gwyneth Sprout, MD 02/07/23 [phone]

## 2023-02-07 NOTE — ED Notes (Signed)
[facility] called patient going [facility] to [facility] , ED to ED @ 17:50

## 2023-02-07 NOTE — Discharge Instructions (Addendum)
We recommend that you go to the emergency department at [facility] for additional imaging with MRI to evaluate your headache.  The CT performed here today did not show any signs of stroke or obvious mass.  The blood vessels looked healthy without aneurysms or tears in the blood vessels.  Your lab work showed a slightly high inflammatory marker called a sed rate, which sometimes can indicate inflammation in the blood vessels but is nonspecific.  I spoke with the on-call neurologist, Dr. Selina Cooley, and we discussed your symptoms.  Given the fact that you have a new type of headache, that changes with position and exercise, they recommended MRIs to make sure that you do not have any reasons to have increased pressure in the brain, from a mass or tumor, or a blood clot in the veins that surround the brain.  These can occur after viral infection such as COVID.  As long as you are feeling better and your imaging does not show anything concerning, we anticipate that you will be able to go home later tonight.  We will help arrange outpatient follow-up with neurology in that case.  The MRIs today were normal.  It might not be a bad idea to follow-up with the eye doctor just to get your eye checked out.  No signs of tumors, blood clots, strokes or other issues today.  If you continue to have the headache you can call the number for neurology tell them you are in the emergency room and need follow-up.  Your regular doctor can also help coordinate follow-up.

## 2023-02-07 NOTE — ED Provider Notes (Signed)
[facility] Provider Note   CSN: [id number] Arrival date & time: 02/07/23  1351     History  Chief Complaint  Patient presents with   Headache    [name] is a 72 y.o. female.  Patient with history of coronary artery disease, hypertension, hyperlipidemia, reflux --presents for evaluation of new onset headache.  Symptoms started about 3 to 4 days ago.  No head injury.  Started as a mild frontal headache but then moved to around her eye on the right side.  She denies any vision changes.  Pain has been intermittent, but more constant today.  She reports more severe pain yesterday while using an elliptical machine.  Also reports worsening pain with position, for instance when she bends over.  No fevers, neck pain, confusion.  She has had COVID recently and had a lot of nasal congestion, however this has been improving.  She does report history of cataracts.  She had presented to [facility] walk-in clinic today because she could not get a PCP appointment, and they encouraged her to come to the emergency department for imaging.  She has a family member with history of meningioma.       Home Medications Prior to Admission medications   Medication Sig Start Date End Date Taking? Authorizing Provider  acyclovir (ZOVIRAX) 400 MG tablet Take 400 mg by mouth as needed.    [provider]  ARTIFICIAL TEAR SOLUTION OP Place 1 drop into both eyes 2 (two) times daily.    [provider]  Aspirin 81 MG CAPS Take 81 mg by mouth daily.    [provider]  atorvastatin (LIPITOR) 40 MG tablet Take 1 tablet (40 mg total) by mouth daily. 01/24/23   Swinyer, Zachary George, NP  Cholecalciferol (VITAMIN D) 50 MCG (2000 UT) tablet Take 2,000 Units by mouth daily.    [provider]  cyclobenzaprine (FLEXERIL) 10 MG tablet Take 10 mg by mouth daily as needed for muscle spasms. 04/26/16   [provider]  dicyclomine (BENTYL)  10 MG capsule TAKE 1 CAPSULE TWICE DAILY FOR SPASM(S) AS NEEDED 05/16/22   Napoleon Form, MD  Digestive Enzymes (MULTI-ENZYME) TABS Take 1 tablet by mouth daily as needed (acidic food/beans/broccoli).    [provider]  diltiazem 2 % GEL Apply 1 Application topically as needed. For rectal spasm 12/28/21   Napoleon Form, MD  ezetimibe (ZETIA) 10 MG tablet Take 1 tablet (10 mg total) by mouth daily. 10/17/21   Swinyer, Zachary George, NP  fluticasone (FLONASE) 50 MCG/ACT nasal spray 1 spray as needed. 10/29/16   [provider]  hyoscyamine (LEVSIN SL) 0.125 MG SL tablet DISSOLVE 1 TABLET UNDER THE TONGUE EVERY DAY AS NEEDED 05/16/22   Napoleon Form, MD  linaclotide (LINZESS) 72 MCG capsule Take 1 capsule (72 mcg total) by mouth daily before breakfast. Patient taking differently: Take 72 mcg by mouth as needed. 10/01/22   Napoleon Form, MD  nebivolol (BYSTOLIC) 2.5 MG tablet Take 1 tablet (2.5 mg total) by mouth daily. 09/02/22   Swinyer, Zachary George, NP  NEXIUM 40 MG capsule BRAND NAME ONLY- take 1 capsule daily as directed 09/13/21   Napoleon Form, MD  nitroGLYCERIN (NITROSTAT) 0.4 MG SL tablet place 1 tablet under the tongue if needed every 5 minutes for chest pain for 3 doses IF NO RELIEF AFTER 3RD DOSE CALL PRESCRIBER OR 911. 12/20/21   Lyn Records, MD  Peppermint Oil 90 MG CPCR Take 2 capsules by mouth as needed (indigestion).    [provider]  Probiotic Product (PROBIOTIC PO) Take 1 capsule by mouth daily.    [provider]  QUEtiapine (SEROQUEL) 25 MG tablet TAKE 1 TABLET AT BEDTIME 12/04/22   Cottle, Steva Ready., MD  ranolazine (RANEXA) 500 MG 12 hr tablet Take 1 tablet (500 mg total) by mouth 2 (two) times daily. 10/04/22   Swinyer, Zachary George, NP  sucralfate (CARAFATE) 1 g tablet Take 1 tablet (1 g total) by mouth 3 (three) times daily. As needed 04/20/21   Napoleon Form, MD  traMADol (ULTRAM) 50 MG tablet Take by mouth. 07/12/21    [provider]      Allergies    Clindamycin/lincomycin, Doxycycline, Escitalopram oxalate, Levsinex timecaps [hyoscyamine], Macrodantin, Sulfa antibiotics, Trazodone and nefazodone, Trimethoprim, Epinephrine, Lincomycin, and Nitrofurantoin macrocrystal    Review of Systems   Review of Systems  Physical Exam Updated Vital Signs BP (!) 153/82   Pulse 77   Temp 98.5 F (36.9 C) (Oral)   Resp 18   Ht 5\' 2"  (1.575 m)   Wt 59 kg   SpO2 97%   BMI 23.79 kg/m   Physical Exam Vitals and nursing note reviewed.  Constitutional:      Appearance: She is well-developed.  HENT:     Head: Normocephalic and atraumatic.     Jaw: No trismus.     Right Ear: Tympanic membrane, ear canal and external ear normal.     Left Ear: Tympanic membrane, ear canal and external ear normal.     Nose: Nose normal. No mucosal edema or rhinorrhea.     Mouth/Throat:     Mouth: Mucous membranes are moist. Mucous membranes are not dry. No oral lesions.     Pharynx: Uvula midline. No oropharyngeal exudate, posterior oropharyngeal erythema or uvula swelling.     Tonsils: No tonsillar abscesses.  Eyes:     General: Lids are normal.        Right eye: No discharge.        Left eye: No discharge.     Intraocular pressure: Right eye pressure is 19 mmHg.     Extraocular Movements:     Right eye: No nystagmus.     Left eye: No nystagmus.     Conjunctiva/sclera: Conjunctivae normal.     Pupils: Pupils are equal, round, and reactive to light.  Cardiovascular:     Rate and Rhythm: Normal rate and regular rhythm.     Heart sounds: Normal heart sounds.  Pulmonary:     Effort: Pulmonary effort is normal. No respiratory distress.     Breath sounds: Normal breath sounds. No wheezing or rales.  Abdominal:     Palpations: Abdomen is soft.     Tenderness: There is no abdominal tenderness.  Musculoskeletal:     Cervical back: Normal range of motion and neck supple. No tenderness or bony tenderness.   Lymphadenopathy:     Cervical: No cervical adenopathy.  Skin:    General: Skin is warm and dry.  Neurological:     Mental Status: She is alert and oriented to person, place, and time.     GCS: GCS eye subscore is 4. GCS verbal subscore is 5. GCS motor subscore is 6.     Cranial Nerves: No cranial nerve deficit.     Sensory: No sensory deficit.     Motor: No weakness.     Coordination: Coordination  normal.     Gait: Gait normal.     Comments: Upper extremity myotomes tested bilaterally:  C5 Shoulder abduction 5/5 C6 Elbow flexion/wrist extension 5/5 C7 Elbow extension 5/5 C8 Finger flexion 5/5 T1 Finger abduction 5/5  Lower extremity myotomes tested bilaterally: L2 Hip flexion 5/5 L3 Knee extension 5/5 L4 Ankle dorsiflexion 5/5 S1 Ankle plantar flexion 5/5   Psychiatric:        Mood and Affect: Mood normal.     ED Results / Procedures / Treatments   Labs (all labs ordered are listed, but only abnormal results are displayed) Labs Reviewed - No data to display  EKG None  Radiology No results found.  Procedures Procedures    Medications Ordered in ED Medications  tetracaine (PONTOCAINE) 0.5 % ophthalmic solution 2 drop (has no administration in time range)    ED Course/ Medical Decision Making/ A&P    Patient seen and examined. History obtained directly from patient.   Labs/EKG: Independently reviewed and interpreted.  This included: CBC, BMP, ESR.  Imaging: Independently visualized and interpreted.  This included: CTA head  Medications/Fluids: Ordered: Tetracaine  Most recent vital signs reviewed and are as follows: BP (!) 153/82   Pulse 77   Temp 98.5 F (36.9 C) (Oral)   Resp 18   Ht 5\' 2"  (1.575 m)   Wt 59 kg   SpO2 97%   BMI 23.79 kg/m   Initial impression: New onset periorbital right sided headache with red flags including exertion and position.  Will check pressures but overall low concern for glaucoma.  Overall low concern for acute  bleed.  Due to risk factors and exertional nature of pain, will check CT angio of the head and neck.  Basic lab work including sed rate ordered.  3:20 PM R eye pressure normal.   5:38 PM Reassessment performed. Patient appears well.  She states that her headache is currently resolved.  Patient was discussed with Dr. Deretha Emory.  I spoke with Dr. Selina Cooley, on-call neurologist by telephone.  We discussed patient presentation and workup to this point.  She recommends transfer for MRI of the brain and orbits as well as MRV to evaluate for other causes of increased intracranial pressure, given positional and exertional nature of headache.  Her recommendation is that if patient continues to feel well and the MRI/MRV is negative, she can be discharged to home with outpatient neurology follow-up.  If she continues to have headache, would recommend inpatient neurology consult.  Labs personally reviewed and interpreted including: CBC unremarkable; BMP unremarkable; sed rate modestly elevated at 32.  CRP was added on.  Imaging personally visualized and interpreted including: CT of the head and neck, agree no acute findings.  Reviewed pertinent lab work and imaging with patient at bedside. Questions answered.   She is willing to have the MRI performed tonight.  We discussed rationale for obtaining these tests.  Discussed personal vehicle versus ambulance transport.  Patient is comfortable with taking herself up to [facility] for imaging.  Her IV will be left in place and packaged for transport.  Discussed with Dr. Anitra Lauth who accepts patient for transfer.  Most current vital signs reviewed and are as follows: BP (!) 153/82   Pulse 77   Temp 98.5 F (36.9 C) (Oral)   Resp 18   Ht 5\' 2"  (1.575 m)   Wt 59 kg   SpO2 97%   BMI 23.79 kg/m   Plan: Transfer POV for MRI, possible neurology consult if needed.  Medical Decision Making Amount and/or Complexity of Data  Reviewed Labs: ordered. Radiology: ordered.  Risk Prescription drug management.   In regards to the patient's headache, critical differentials were considered including subarachnoid hemorrhage, intracerebral hemorrhage, epidural/subdural hematoma, pituitary apoplexy, vertebral/carotid artery dissection, giant cell arteritis, central venous thrombosis, reversible cerebral vasoconstriction, acute angle closure glaucoma, idiopathic intracranial hypertension, bacterial meningitis, viral encephalitis, carbon monoxide poisoning, posterior reversible encephalopathy syndrome, pre-eclampsia.   Reg flag symptoms related to these causes were considered including systemic symptoms (fever, weight loss), neurologic symptoms (confusion, mental status change, vision change, associated seizure), acute or sudden "thunderclap" onset, patient age 71 or older with new or progressive headache, patient of any age with first headache or change in headache pattern, pregnant or postpartum status, history of HIV or other immunocompromise, history of cancer, headache occurring with exertion, associated neck or shoulder pain, associated traumatic injury, concurrent use of anticoagulation, family history of spontaneous SAH, and concurrent drug use.    Other benign, more common causes of headache were considered including migraine, tension-type headache, cluster headache, referred pain from other cause such as sinus infection, dental pain, trigeminal neuralgia.   On exam, patient has a reassuring neuro exam including baseline mental status, no significant neck pain or meningeal signs, no signs of severe infection or fever.            Final Clinical Impression(s) / ED Diagnoses Final diagnoses:  Periorbital headache    Rx / DC Orders ED Discharge Orders     None         Renne Crigler, PA-C 02/07/23 1741    Alvira Monday, MD 02/07/23 2238

## 2023-02-07 NOTE — ED Notes (Signed)
Pt IV secured and given transfer paperwork. Pt instructed to go directly to [facility] ED without interruption. Pt verbalizes understanding.

## 2023-02-08 DIAGNOSIS — R519 Headache, unspecified: Secondary | ICD-10-CM | POA: Diagnosis not present

## 2023-02-14 ENCOUNTER — Encounter: Payer: Self-pay | Admitting: Neurology

## 2023-02-14 ENCOUNTER — Other Ambulatory Visit: Payer: Self-pay | Admitting: Psychiatry

## 2023-02-14 DIAGNOSIS — F5101 Primary insomnia: Secondary | ICD-10-CM

## 2023-02-14 NOTE — Progress Notes (Unsigned)
HPI-   F former smoker followed for  Dyspnea, Lung Nodule, courtesy of Dr Sherilyn Cooter Smith/ Cardiology. She had complained of extreme fatigue with 30 minutes of physical actiivity, going to gym 4-5x/ week. No chest pain. Medical problem list includes CAD/ MI, CVA, HTN, Echo Grade 2 DD, GERD, IBS, Degenerative Disk Disease, Hx Bladder  Cancer, Hyperlipidemia,  PFT 03/29/20-Very Minimal obstruction, no resp to BD, Normal Lung volumes and normal DLCO Walk Test on room air 07/20/20- Very brisk walk with lowest O2 sat 99% and Max HR 95   2 laps. a1AT assay 07/20/20- MM 137 CT chest 08/09/20- No signs of emphysema. 9 mm non-solid nodule within the lateral superior segment of the left lower lobe.  =============================================================   12/27/21- 29 yo  F former smoker(  passive smoke exposure to parents as a child, and she smoked some before age 75.) followed for Dyspnea, Lung Nodule,CAD/ MI, CVA, HTN, Echo Grade 2 DD, GERD, IBS, Degenerative Disk Disease, Hx Bladder  Cancer, Hyperlipidemia,  She had complained of extreme fatigue with 30 minutes of physical actiivity, going to gym 4-5x/ week. No chest pain. -Trelegy 100, Namenda, Bystolic, Oxycodone Dr Jennelle Human follows for Insomnia> seroquel -----Pt f/u finally stopped coughing, no SOB to report. Here to go over CT scans Blood pressure noted "soft" today 92/58.  She denies dizziness but will follow up with cardiology as needed.  Avoid dehydration and summer heat. She thinks it is easier for her to get dyspnea on exertion than 2 years ago if briskly exercising such as rapid walking.  After last visit cough persisted for about 2 months and is finally gone.  We reviewed her CT scans and agreed to follow-up nodules in 2 years. CT max face 12/21/21- IMPRESSION: 1. Normally aerated paranasal sinuses. Patent sinus drainage pathways. 2. Rightward deviation of the nasal septum. 3. Minimal mucosal thickening within the bilateral nasal  passages. HRCT chest 12/24/21- IMPRESSION: 1. Unchanged, somewhat amorphous ground-glass opacity in the peripheral superior segment left lower lobe measuring 0.7 cm nonspecific and most likely benign, incidental sequelae of infection or inflammation, not typical in appearance for a pulmonary metastasis. Additional follow-up could be performed in 2 years to establish 5 years of total stability if desired. 2. Stable, benign 0.6 cm subpleural nodule of the left lung base. 3. No evidence of fibrotic interstitial lung disease. Bland appearing, bandlike scarring of the bilateral lung bases. 4. Mild, lobular air trapping on expiratory phase imaging, suggesting small airways disease. 5. New, although subacute appearing partially callused fracture of the posterior left seventh rib. 6. Coronary artery disease. Aortic Atherosclerosis (ICD10-I70.0).  02/17/23- 72 yo  F former smoker(  passive smoke exposure to parents as a child, and she smoked some before age 12.) followed for Dyspnea, Lung Nodules, CAD/ MI, CVA, HTN, Echo Grade 2 DD, GERD, IBS, Degenerative Disk Disease, Hx Bladder  Cancer, Hyperlipidemia,  She had complained of extreme fatigue with 30 minutes of physical actiivity, going to gym 4-5x/ week. No chest pain. -Trelegy 100, Namenda, Bystolic, Oxycodone Dr Jennelle Human follows for Insomnia> seroquel She reports COVID infection in the spring with bronchitis and a lot of green mucus from nose and chest which resolved.  She then got a retro-orbital pain with negative imaging workup so far, no evidence for sinus infection.  Now pending neurology evaluation. Breathing is comfortable.  We discussed CT scan showing no concerning nodules now.  Will be happy to see her again if needed. HRCT chest 12/26/22- IMPRESSION: 1. No evidence of interstitial  lung disease. Mild air trapping is indicative of small airways disease. 2. Mild cylindrical bronchiectasis. 3. No suspicious pulmonary nodules. 4. Aortic  atherosclerosis (ICD10-I70.0). Coronary artery calcification.  ROS-see HPI   + = positive Constitutional:    weight loss, night sweats, fevers, chills, +fatigue, lassitude. HEENT:    headaches, difficulty swallowing, tooth/dental problems, sore throat,       sneezing, itching, ear ache, nasal congestion, post nasal drip, snoring CV:    chest pain, orthopnea, PND, swelling in lower extremities, anasarca,                                   dizziness, palpitations Resp:   +shortness of breath with exertion or at rest.                productive cough,   non-productive cough, coughing up of blood.              change in color of mucus.  wheezing.   Skin:    rash or lesions. GI:  No-   heartburn, indigestion, abdominal pain, nausea, vomiting, diarrhea,                 change in bowel habits, loss of appetite GU: dysuria, change in color of urine, no urgency or frequency.   flank pain. MS:   joint pain, stiffness, decreased range of motion, back pain. Neuro-     nothing unusual Psych:  change in mood or affect.  depression or anxiety.   memory loss.  OBJ- Physical Exam General- Alert, Oriented, Affect-appropriate, Distress- none acute, looks well Skin- rash-none, lesions- none, excoriation- none Lymphadenopathy- none Head- atraumatic            Eyes- Gross vision intact, PERRLA, conjunctivae and secretions clear            Ears- Hearing, canals-normal            Nose- Clear, no-Septal dev, mucus, polyps, erosion, perforation             Throat- Mallampati II , mucosa clear , drainage- none, tonsils- atrophic Neck- flexible , trachea midline, no stridor , thyroid nl, carotid no bruit Chest - symmetrical excursion , unlabored           Heart/CV- RRR , no murmur , no gallop  , no rub, nl s1 s2                           - JVD- none , edema- none, stasis changes- none, varices- none           Lung- clear to P&A, wheeze- none, cough- none , dullness-none, rub- none           Chest wall-  Abd-   Br/ Gen/ Rectal- Not done, not indicated Extrem- cyanosis- none, clubbing, none, atrophy- none, strength- nl Neuro- grossly intact to observation

## 2023-02-17 ENCOUNTER — Encounter: Payer: Self-pay | Admitting: Internal Medicine

## 2023-02-17 ENCOUNTER — Ambulatory Visit: Payer: Medicare Other | Admitting: Internal Medicine

## 2023-02-17 VITALS — BP 112/62 | HR 68 | Ht 62.5 in | Wt 132.8 lb

## 2023-02-17 DIAGNOSIS — K219 Gastro-esophageal reflux disease without esophagitis: Secondary | ICD-10-CM

## 2023-02-17 DIAGNOSIS — R918 Other nonspecific abnormal finding of lung field: Secondary | ICD-10-CM | POA: Diagnosis not present

## 2023-02-17 NOTE — Patient Instructions (Signed)
Please let us know if we can help in the future

## 2023-02-18 ENCOUNTER — Encounter: Payer: Self-pay | Admitting: Internal Medicine

## 2023-02-18 ENCOUNTER — Telehealth: Payer: Self-pay | Admitting: Nurse Practitioner

## 2023-02-18 NOTE — Telephone Encounter (Signed)
Thank you for the detailed message. Please ask her to continue to monitor blood pressure and HR and bring those readings with her along with her home cuff to the appointment on Friday.

## 2023-02-18 NOTE — Telephone Encounter (Signed)
Returned call to patient and left voicemail with Michelle's recommendation.

## 2023-02-18 NOTE — Telephone Encounter (Signed)
Lengthy conversation with patient around blood pressure. She was in the ED for R eye pain (ruled out infarct and referred to neurology, appt in January) and one week later she noticed her BP was rising over her usual. Still having intermittent R eye pain. Said she began feeling jittery,weak, heavy arms, and "my head felt funny." Husband checked her BP while she lied down and states it was 172/60, HR 60's. She checked again two hours later, reports "150/70-something" and at bedtime was still 154/74. The ffeling slowly improved throughout the day, but didn't feel great that day.  9/7 132/76, HR 59 (AM) 147/69, 56 (PM) 9/8 134/72, 56(AM)  121/63,62  142/70,62 (PM) 9/9 *Reports feeling extremely lethargic. Felt as if she couldn't walk or have energy to do any routine chores* 112/62, 68 (AM)  122/70, 54 (noon) 144/59, 61(PM)  Pt still taking Bystolic 2.5mg  daily in the evening. Condones eating and drinking normally (endorses high sodium intake, but no change from her usual). Weighs at home and states she is up 2-3 pounds from her baseline. Has several GI issues, but is having normal bowel movements for last several weeks. Not wanting another medication unless necessary; really interested in the root cause of the HTN. PT scheduled w/Swinyer for this Friday. Will route to her. Advised pt I would call her if any recommendations are available prior to visit.

## 2023-02-18 NOTE — Assessment & Plan Note (Signed)
On HRCT 12/26/2022-no suspicious pulmonary nodules and no evidence of interstitial disease.  Mild evidence of small airways disease with mild cylindrical bronchiectasis.  Atherosclerosis is noted. Plan-with no active respiratory symptoms or ongoing concern, I have offered to see her again as needed.

## 2023-02-18 NOTE — Telephone Encounter (Signed)
Pt c/o BP issue: STAT if pt c/o blurred vision, one-sided weakness or slurred speech  1. What are your last 5 BP readings? 151/73 BP and 53 HR this morning but it was 172/60 on 9/6   2. Are you having any other symptoms (ex. Dizziness, headache, blurred vision, passed out)? Pt stated she'd had a very odd feeling but can't really explain it.   3. What is your BP issue? She stated she'd very concerned about her BP fluctuating very often lately and its been more on the high side than lower. Please advise

## 2023-02-18 NOTE — Progress Notes (Signed)
Cardiology Office Note:  .   Date:  02/21/2023  ID:  [name], DOB 1950-06-16, MRN [id number] PCP: Collene Mares, PA   HeartCare Providers Cardiologist:  Lesleigh Noe, MD (Inactive)    Patient Profile: .      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 #1 50-70% 1st diagonal 80% 3rd diagonal 50% mid and distal LAD Heavy calcification all 3 coronaries with normal LVEF LHC 02/18/2017 Abnormal LV wall motion suggesting stress cardiomyopathy with atypical features (preserved apex function EF 25-35% with mildly elevated LVEDP Severe diffuse calcification of prox to mid LAD 30-40% pLAD 60-70% mLAD 80-90% oLAD, first diagonal Mild luminal irregularities in the right coronary and circumflex coronary arteries Hypertension Hyperlipidemia Bladder cancer Anxiety disorder  Admission 2008 for chest pain and CT demonstrating evidence moderate obstructive CAD with 70 to 80% septal perforator #1, 50 to 70% first diagonal, 80% third diagonal, 50% mid and distal LAD and healthy calcification throughout all 3 coronaries with normal LVEF. Management risk factor modification.   Cardiac cath 02/18/2017 as outlined above.  No significant change other than decrease in EF.  Medical management of stress cardiomyopathy recommended.  Echo 02/19/2017 with EF 40 to 45%, akinesis of distal anterior septal, anterolateral, inferolateral, and inferoseptal and apical myocardium with basal to mid sparing of contractile performance. Systolic pressure mildly increased, peak PA pressure 36 mmHg.   Cardiac monitor 02/2021 revealed NSR with occasional PACs and PVCs, with symptoms correlating.   Seen by me 10/17/21 at which time she had lingering cough from recent respiratory illness and was feeling fatigued.  Noted dyspnea on exertion with walking.  Taking Ranexa only prior to working out. Having symptoms of angina after going hard on the elliptical for 9 to 12  minutes.  No chest discomfort with walking.  Was prescribed an inhaler by pulmonology to use prior to exercise but was not using it at that time.  Her symptoms were discussed with Dr. Katrinka Blazing, primary cardiologist, and no further testing was advised.   Seen in follow-up by me on 08/13/22 at which time she continued to have angina with exertion generally lasting for about 5:10 to 6 minutes of exercise.  Lengthy discussion about medical management. She was advised to take Ranexa 500 mg twice daily and monitor for improvement of symptoms.  She was continued on nebivolol, aspirin, atorvastatin, and ezetimibe.  BP was well-controlled.  LDL cholesterol was 55 on 78/29/5621. 6 month f/u was advised.   She contacted our office 02/18/23 with concerns about her blood pressure.        History of Present Illness: .   [name] is a very pleasant 72 y.o. female who is here today for evaluation of recent elevation in home BP. She reports having symptoms of viral illness, including COVID infection in June with continued possible bronchitis, sore throat, cough and congestion. On August 25th, she had severe throbbing pain behind her left eye. This lasted a couple of days.  She did not feel like working out during this time.  She was seen at [facility] walk-in clinic and went to ER on August 30.  CT scan and MRI were negative, no acute concerns were identified. She did not take any OTC cold medication during her illness. Since that time, she has continued to have of pain in her eyes, and headache and got lightheaded and felt like she was going to pass out at the entrance of the [facility] on 02/13/2023.  On 02/14/2023, she was feeling "weird and jittery."  She checked her BP which was significantly elevated from her normal at 172/60. Repeat BP was 154/74. BP remained elevated until 02/20/23 when it returned to and has been maintained in the 120s or lower systolic. Admits she was dealing with a stressful situation during this time. At  moments during this time frame, she has had severe fatigue. On one occasion at another provider's office, she noted her arms felt weak and her BP was low normal. Her husband is concerned about stress causing these symptoms. 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 She feels that she manages her stress well.  She denies chest pain, shortness of breath, palpitations, orthopnea, PND, presyncope, syncope. She admits to some dietary indiscretion with sodium, but nothing significant around the time of BP elevations.   ROS: See HPI       Studies Reviewed: .         Risk Assessment/Calculations:             Physical Exam:   VS:  BP 130/74   Pulse 64   Ht 5' 2.5" (1.588 m)   Wt 131 lb 12.8 oz (59.8 kg)   SpO2 97%   BMI 23.72 kg/m    Wt Readings from Last 3 Encounters:  02/21/23 131 lb 12.8 oz (59.8 kg)  02/17/23 132 lb 12.8 oz (60.2 kg)  02/07/23 130 lb 1.1 oz (59 kg)    GEN: Well nourished, well developed in no acute distress, appears younger than stated age NECK: No JVD; No carotid bruits CARDIAC: RRR, no murmurs, rubs, gallops RESPIRATORY:  Clear to auscultation without rales, wheezing or rhonchi  ABDOMEN: Soft, non-tender, non-distended EXTREMITIES:  No edema; No deformity     ASSESSMENT AND PLAN: .    Labile blood pressure/History of hypertension: Recent episode of some elevations in BP up to 172/60 at its highest. BP did not remain significantly elevated, but was higher than her normal. She would like to know why this occurred. We discussed possible causes including viral illness, dehydration, increased sodium intake, stress, and inactivity. Head and neck CT did not reveal any significant stenosis. No indication for renal artery ultrasound at this time. BP over the last 2 days has improved. She will continue to monitor. We will provide hydralazine 25 mg to take as needed up to 3 times daily for SBP > 160 mmHg. Advised that we will consider  further testing if she has further elevations in BP.  CAD: History of diffuse CAD by cath 2008.  She has traditionally had some angina with exertion.  Reports she feels wiped out after exercising for 30 minutes on the elliptical. She is not having any significant chest pain or symptoms concerning for angina. No indication for further ischemia evaluation at this time.   NICM: Left heart cath 02/18/2017 with a EF 25 to 35% and abnormal LV wall motion suggesting stress cardiomyopathy with preserved apex function and mildly elevated LVEDP.  No significant CAD on cath. Echo 03/25/2017 revealed normal LV function and no abnormal wall motion.  It did reveal G2 DD. Since that time she has done well with no evidence of volume overload.  We discussed this history in relation to elevated BP.  She would like to have a repeat echocardiogram to rule out abnormal heart function or structural heart disease.   Palpitations: Quiescent at this time. Continue nebivolol.  Hyperlipidemia: LDL 55 on 05/28/22. We  will repeat lipid testing when she returns for echo.  Continue atorvastatin and ezetimibe.       Dispo: 6 months with me  Signed, Eligha Bridegroom, NP-C

## 2023-02-18 NOTE — Assessment & Plan Note (Signed)
Chronic microaspiration can be a cause for bronchiectasis.  Long-term attention to reflux precautions emphasized.

## 2023-02-21 ENCOUNTER — Other Ambulatory Visit: Payer: Self-pay | Admitting: Nurse Practitioner

## 2023-02-21 ENCOUNTER — Encounter: Payer: Self-pay | Admitting: Nurse Practitioner

## 2023-02-21 ENCOUNTER — Ambulatory Visit: Payer: Medicare Other | Attending: Nurse Practitioner | Admitting: Nurse Practitioner

## 2023-02-21 VITALS — BP 130/74 | HR 64 | Ht 62.5 in | Wt 131.8 lb

## 2023-02-21 DIAGNOSIS — R002 Palpitations: Secondary | ICD-10-CM

## 2023-02-21 DIAGNOSIS — I428 Other cardiomyopathies: Secondary | ICD-10-CM

## 2023-02-21 DIAGNOSIS — R0989 Other specified symptoms and signs involving the circulatory and respiratory systems: Secondary | ICD-10-CM | POA: Diagnosis not present

## 2023-02-21 DIAGNOSIS — I25118 Atherosclerotic heart disease of native coronary artery with other forms of angina pectoris: Secondary | ICD-10-CM

## 2023-02-21 DIAGNOSIS — I10 Essential (primary) hypertension: Secondary | ICD-10-CM

## 2023-02-21 DIAGNOSIS — E785 Hyperlipidemia, unspecified: Secondary | ICD-10-CM

## 2023-02-21 MED ORDER — HYDRALAZINE HCL 25 MG PO TABS: ORAL_TABLET | ORAL | 1 refills | Status: AC

## 2023-02-21 NOTE — Patient Instructions (Signed)
Medication Instructions:   START Hydralazine one (1) tablet by mouth ( 25 mg) 3 times daily as need for systolic BP > 160.   *If you need a refill on your cardiac medications before your next appointment, please call your pharmacy*   Lab Work:  Your physician recommends that you return for a FASTING lipid profile/cmet on the day of your echo. You can come in on the day of your appointment anytime between 7:30-4:30 fasting from midnight the night before.   If you have labs (blood work) drawn today and your tests are completely normal, you will receive your results only by: MyChart Message (if you have MyChart) OR A paper copy in the mail If you have any lab test that is abnormal or we need to change your treatment, we will call you to review the results.   Testing/Procedures:  Your physician has requested that you have an echocardiogram. Echocardiography is a painless test that uses sound waves to create images of your heart. It provides your doctor with information about the size and shape of your heart and how well your heart's chambers and valves are working. This procedure takes approximately one hour. There are no restrictions for this procedure. Please do NOT wear perfume or lotions (deodorant is allowed). Please arrive 15 minutes prior to your appointment time.    Follow-Up: At [facility], you and your health needs are our priority.  As part of our continuing mission to provide you with exceptional heart care, we have created designated Provider Care Teams.  These Care Teams include your primary Cardiologist (physician) and Advanced Practice Providers (APPs -  Physician Assistants and Nurse Practitioners) who all work together to provide you with the care you need, when you need it.  We recommend signing up for the patient portal called "MyChart".  Sign up information is provided on this After Visit Summary.  MyChart is used to connect with patients for Virtual Visits  (Telemedicine).  Patients are able to view lab/test results, encounter notes, upcoming appointments, etc.  Non-urgent messages can be sent to your provider as well.   To learn more about what you can do with MyChart, go to [email or web address].    Your next appointment:   6 month(s)  Provider:   Eligha Bridegroom, NP         Other Instructions  I will call or send you a mychart message in December with appointment time and date with Marcelino Duster in March.

## 2023-03-11 ENCOUNTER — Ambulatory Visit: Payer: Medicare Other | Attending: Cardiovascular Disease

## 2023-03-11 ENCOUNTER — Ambulatory Visit: Payer: Medicare Other

## 2023-03-11 DIAGNOSIS — I428 Other cardiomyopathies: Secondary | ICD-10-CM

## 2023-03-11 DIAGNOSIS — R002 Palpitations: Secondary | ICD-10-CM

## 2023-03-11 DIAGNOSIS — E785 Hyperlipidemia, unspecified: Secondary | ICD-10-CM | POA: Diagnosis not present

## 2023-03-11 DIAGNOSIS — I10 Essential (primary) hypertension: Secondary | ICD-10-CM | POA: Insufficient documentation

## 2023-03-11 DIAGNOSIS — I25118 Atherosclerotic heart disease of native coronary artery with other forms of angina pectoris: Secondary | ICD-10-CM | POA: Diagnosis not present

## 2023-03-11 LAB — ECHOCARDIOGRAM COMPLETE
Area-P 1/2: 3.81 cm2
S' Lateral: 2.1 cm

## 2023-03-12 LAB — COMPREHENSIVE METABOLIC PANEL
ALT: 19 [IU]/L (ref 0–32)
AST: 23 [IU]/L (ref 0–40)
Albumin: 4.3 g/dL (ref 3.8–4.8)
Alkaline Phosphatase: 96 [IU]/L (ref 44–121)
BUN/Creatinine Ratio: 13 (ref 12–28)
BUN: 9 mg/dL (ref 8–27)
Bilirubin Total: 0.3 mg/dL (ref 0.0–1.2)
CO2: 27 mmol/L (ref 20–29)
Calcium: 9.4 mg/dL (ref 8.7–10.3)
Chloride: 103 mmol/L (ref 96–106)
Creatinine, Ser: 0.7 mg/dL (ref 0.57–1.00)
Globulin, Total: 2.5 g/dL (ref 1.5–4.5)
Glucose: 90 mg/dL (ref 70–99)
Potassium: 4.2 mmol/L (ref 3.5–5.2)
Sodium: 142 mmol/L (ref 134–144)
Total Protein: 6.8 g/dL (ref 6.0–8.5)
eGFR: 92 mL/min/{1.73_m2} (ref >59)

## 2023-03-12 LAB — LIPID PANEL
Chol/HDL Ratio: 2.5 {ratio} (ref 0.0–4.4)
Cholesterol, Total: 152 mg/dL (ref 100–199)
HDL: 62 mg/dL (ref >39)
LDL Chol Calc (NIH): 68 mg/dL (ref 0–99)
Triglycerides: 127 mg/dL (ref 0–149)
VLDL Cholesterol Cal: 22 mg/dL (ref 5–40)

## 2023-04-07 ENCOUNTER — Telehealth: Payer: Self-pay | Admitting: Psychiatry

## 2023-04-07 ENCOUNTER — Other Ambulatory Visit: Payer: Self-pay

## 2023-04-07 DIAGNOSIS — F5101 Primary insomnia: Secondary | ICD-10-CM

## 2023-04-07 MED ORDER — QUETIAPINE FUMARATE 25 MG PO TABS: 25.0000 mg | ORAL_TABLET | Freq: Every day | ORAL | 0 refills | Status: DC

## 2023-04-07 NOTE — Telephone Encounter (Signed)
Pt LVM @ 9:15a.  She said she is still taking Seroquel 25mg .  She wasn't able to sleep last night because she has run completely out. She is asking for a refill to be sent locally to   [id number] - [place], [place] - [address] AT [address] & [address] [place] [address] Phone: [phone]  Fax: [phone]   No upcoming appt scheduled.

## 2023-04-07 NOTE — Telephone Encounter (Signed)
Meds sent 1 months supply; please schedule pt appt.

## 2023-04-07 NOTE — Telephone Encounter (Signed)
Pt is coming in tomorrow  °

## 2023-04-08 ENCOUNTER — Encounter: Payer: Self-pay | Admitting: Psychiatry

## 2023-04-08 ENCOUNTER — Ambulatory Visit (INDEPENDENT_AMBULATORY_CARE_PROVIDER_SITE_OTHER): Payer: Medicare Other | Admitting: Psychiatry

## 2023-04-08 DIAGNOSIS — F5101 Primary insomnia: Secondary | ICD-10-CM

## 2023-04-08 MED ORDER — QUETIAPINE FUMARATE 25 MG PO TABS
25.0000 mg | ORAL_TABLET | Freq: Every day | ORAL | 3 refills | Status: DC
Start: 2023-04-08 — End: 2023-04-09

## 2023-04-08 NOTE — Progress Notes (Signed)
[name] [id number] 1951/01/06 72 y.o.  Subjective:   Patient ID:  [name] is a 72 y.o. (DOB 01-Feb-1951) female.  Chief Complaint:  Chief Complaint  Patient presents with   Follow-up   Sleeping Problem    HPI   [name] presents to the office today for follow-up of chronic insomnia and some anxiety.  seen March 2020.  No meds were changed.  Taking quetiapine daily. 80% of the time it is effective.    08/14/20 appt noted: Seroquel does work, but hangover an hour, but worth it for the sleep.  Goes to sleep and stays asleepMay stay up too late and miss the window at times.  Busy.  Not morning person. No other SE. Overall OK.  Still concerned about [name] and his memory and her memory too.  Daughter continues to be a challenge Re: health.  Son Mechele Collin is a patient here and CO not going to sleep on time.  He's 38.  Also concerned with [name]'s mental health and both his brother's have had sons who committed suicide.  1 of his B with Alzheimers and she's had chronic worry over getting Alzheimer's.  [name] is good socially and exercises.  He does Bible studies and is life of the party publicly.  But she worries over his memory at home. He has agreed to see a neurologist.   She still worries over her memory and cog function too.  Still complains of chronic worry issues and more worry over word-finding problems.  But she has no functional impairment.  Patient reports stable mood and denies depressed or irritable moods.  Patient denies any recent difficulty with anxiety.  Patient denies difficulty with sleep initiation or maintenance as long as she takes the quetiapine.  Gets 7 hours usually.  Not drowsy daytime unless she sits to read she's tired chronically. Denies appetite disturbance.  Patient reports that energy and motivation have been good.  Patient denies any difficulty with concentration.  Patient denies any suicidal ideation. Goes to gym. 4 years clean from cancer. Plan:  Continue low dose quetiapine 25 mg HS for TR insomnia. Disc NAC off label for cog  04/16/2021 appointment with the following noted: [name]'s B died Alzheimer's. She's concerned about tico's memory and judgment but neuropsych testing was unremarkable. She asked that I review the document with her and answer questions. Disc whether or not one can fake neuropsych testing. Usually 7 hours of sleep. No NAC tried bc of afraid of stomach problems, but she agrees to try. Plan: no med changes  04/15/22 appt noted: Cycles of sleep trouble.   Still taking Seroquel 25 mg HS.  Some days not working but partly her fault bc night owl and will fight through sleepiness.  Hangover in the morning about 90 mins.  Never felt restored in the AM. Chronic GI issues.  Don't do well with generic Nexium and cost issues. Chronic concerns about memory.  Always direction challenged and some trouble deciding how to get here this morning.  Word finding etc.  She asks about getting another MRI.  She had one in 2011 which was normal.  No other neuro changes.   She's not sure she ever tried  NAC. [name] is still active physically and active but she still worries over his cognition. Plan: trial chlorpromazine 25 mg HS.  She wants to try it for TR insomnia.    04/08/23 appt noted: She wanted to continue Seroquel 25 mg HS for sleep vs chlopromazine.  Bc latter didn't work. Sleep much worse without Seroquel.   Working on sleep hygiene ongoing looking at time on onset and getting up.   Asks question about timing and sleep.   If not active in the morning will fall asleep like in church.  This has been true for decades and not new with Seroquel. Had Covid 2nd time in June and still fatigue after it.  Tries not to nap daytime bc of insomnia but will have to nap at times.   Overall thinks Seroquel is ok.   Some concerns that she is losing skills in organization and multitasking.   Still some concerns with H's memory but he's been  worked up also.   Perfectionistic and that can interfere with her performance.  Can feel unsettled with significant changes in her environment.   Can pull off party planning and accomplishment.  Things seem a little harder but can do them.   Very involved with others at church.   Still focus problems that probably are the source of memory issues.  I can go all over the place mentally. Avg 7-8 hours of sleep.   Past Psychiatric Medication Trials:  Multiple sleep med failures including temazepam,  clonazepam with sedation,  alprazolam with no response,  trazodone GI side effects,  doxylamine,  Belsomra no response,  quetiapine good response at 25 mg she has hangover Chlorpromazine NR Lexapro nausea,   sertraline 25 mg with cognitive side effects,  .    She has been treated treated for anxiety as a way of trying to address the insomnia but that was not an adequate solution.  Prior neuropsychological testing 2004 because the patient had complaints of memory problems was negative for significant cognitive impairment  GD's psych issues.  Review of Systems:  Review of Systems  Cardiovascular:  Negative for chest pain and palpitations.  Gastrointestinal:  Positive for abdominal pain and nausea.  Musculoskeletal:  Positive for arthralgias. Negative for back pain.  Neurological:  Negative for tremors and weakness.  Psychiatric/Behavioral:  Positive for decreased concentration and sleep disturbance. Negative for agitation, behavioral problems, confusion, dysphoric mood, hallucinations, self-injury and suicidal ideas. The patient is not nervous/anxious and is not hyperactive.     Medications: I have reviewed the patient's current medications.  Current Outpatient Medications  Medication Sig Dispense Refill   acyclovir (ZOVIRAX) 400 MG tablet Take 400 mg by mouth as needed.     ARTIFICIAL TEAR SOLUTION OP Place 1 drop into both eyes 2 (two) times daily.     Aspirin 81 MG CAPS Take 81 mg by  mouth daily.     atorvastatin (LIPITOR) 40 MG tablet Take 1 tablet (40 mg total) by mouth daily. 90 tablet 2   Bacillus Coagulans-Inulin (PROBIOTIC-PREBIOTIC PO) Take 1 capsule by mouth in the morning and at bedtime.     Cholecalciferol (VITAMIN D) 50 MCG (2000 UT) tablet Take 2,000 Units by mouth daily.     cyclobenzaprine (FLEXERIL) 10 MG tablet Take 10 mg by mouth daily as needed for muscle spasms.     dicyclomine (BENTYL) 10 MG capsule TAKE 1 CAPSULE TWICE DAILY FOR SPASM(S) AS NEEDED 180 capsule 3   Digestive Enzymes (MULTI-ENZYME) TABS Take 1 tablet by mouth daily as needed (acidic food/beans/broccoli).     diltiazem 2 % GEL Apply 1 Application topically as needed. For rectal spasm 30 g 1   ezetimibe (ZETIA) 10 MG tablet Take 1 tablet (10 mg total) by mouth daily. 90 tablet 3   fluticasone (  FLONASE) 50 MCG/ACT nasal spray 1 spray as needed.     hydrALAZINE (APRESOLINE) 25 MG tablet Take one (1) tablet by mouth ( 25 mg) up to three times daily for systolic BP > 160. 90 tablet 1   hyoscyamine (LEVSIN SL) 0.125 MG SL tablet DISSOLVE 1 TABLET UNDER THE TONGUE EVERY DAY AS NEEDED 90 tablet 3   linaclotide (LINZESS) 72 MCG capsule Take 1 capsule (72 mcg total) by mouth daily before breakfast. 30 capsule 3   nebivolol (BYSTOLIC) 2.5 MG tablet Take 1 tablet (2.5 mg total) by mouth daily. 90 tablet 3   NEXIUM 40 MG capsule BRAND NAME ONLY- take 1 capsule daily as directed 90 capsule 3   nitroGLYCERIN (NITROSTAT) 0.4 MG SL tablet place 1 tablet under the tongue if needed every 5 minutes for chest pain for 3 doses IF NO RELIEF AFTER 3RD DOSE CALL PRESCRIBER OR 911. 25 tablet 3   Peppermint Oil 90 MG CPCR Take 2 capsules by mouth as needed (indigestion).     ranolazine (RANEXA) 500 MG 12 hr tablet Take 1 tablet (500 mg total) by mouth 2 (two) times daily. 180 tablet 1   sucralfate (CARAFATE) 1 g tablet Take 1 tablet (1 g total) by mouth 3 (three) times daily. As needed 90 tablet 3   traMADol (ULTRAM) 50  MG tablet Take by mouth.     QUEtiapine (SEROQUEL) 25 MG tablet Take 1 tablet (25 mg total) by mouth at bedtime. 90 tablet 3   No current facility-administered medications for this visit.    Medication Side Effects:  A little hard to get out of the bed  Allergies:  Allergies  Allergen Reactions   Clindamycin/Lincomycin Other (See Comments)    Pt not sure of reaction    Doxycycline Nausea And Vomiting   Escitalopram Oxalate Nausea Only   Levsinex Timecaps [Hyoscyamine] Itching   Macrodantin Other (See Comments)    Flushed, hot   Sulfa Antibiotics     Unknown reaction    Trazodone And Nefazodone Nausea Only   Trimethoprim Other (See Comments)    unknown    Epinephrine Palpitations   Lincomycin Rash   Nitrofurantoin Macrocrystal Anxiety and Rash    Flushing, hot face and ears    Past Medical History:  Diagnosis Date   Anxiety    Psychiatry Dr. Lucie Leather, concerns for memory loss, anxiety   Arthritis    Broken heart syndrome    Cancer (HCC)    bladder CA   Cataract    Coronary atherosclerosis CARDIOLOGIST-  DR Verdis Prime   Cath 11/2006 demonstrating moderate obstructive coronary disease with 70-80% septal perforator #1, 50-70% first diagonal, 80% third diagonal, 50% mid and distal LAD & heavy calcification throughout all 3 coronaries. LVF normal.   Essential hypertension, benign    Fatty liver 04/28/2012   GERD (gastroesophageal reflux disease)    Heart murmur    History of esophageal spasm    takes ranexa   History of TIA (transient ischemic attack)    2011--  no residuals   Hyperlipidemia    IBS (irritable bowel syndrome)    Diarrhea predominent, esophageal spasm severe, GERD - Dr. Julio Alm   Insomnia    Interstitial cystitis    Dr. Patsi Sears   Memory loss    Psychiatry Dr. Lucie Leather, concerns for memory loss, anxiety   PONV (postoperative nausea and vomiting)    AND URINARY RETENTION   TMJ (temporomandibular joint syndrome)    Wears glasses  Family History  Problem Relation Age of Onset   Heart disease Mother    Colon polyps Mother    Alzheimer's disease Mother    Dementia Mother    CAD Mother    Heart disease Father    Diverticulosis Father    CAD Father        in his 58s   Heart disease Brother    Osteoarthritis Other        Multiple family members   Obesity Other        Multiple family members   Depression Other        Multiple family members   Breast cancer Sister 85       Pam   Colon cancer Neg Hx    Esophageal cancer Neg Hx    Stomach cancer Neg Hx    Rectal cancer Neg Hx     Social History   Socioeconomic History   Marital status: Married    Spouse name: Not on file   Number of children: 2   Years of education: Not on file   Highest education level: Not on file  Occupational History   Occupation: caregiver  Tobacco Use   Smoking status: Former    Current packs/day: 0.00    Average packs/day: 1 pack/day for 10.0 years (10.0 ttl pk-yrs)    Types: Cigarettes    Start date: 06/11/1963    Quit date: 06/10/1973    Years since quitting: 49.8   Smokeless tobacco: Never  Vaping Use   Vaping status: Never Used  Substance and Sexual Activity   Alcohol use: Yes    Alcohol/week: 2.0 standard drinks of alcohol    Types: 2 Glasses of wine per week    Comment: social   Drug use: No   Sexual activity: Yes  Other Topics Concern   Not on file  Social History Narrative   Not on file   Social Determinants of Health   Financial Resource Strain: Not on file  Food Insecurity: Not on file  Transportation Needs: Not on file  Physical Activity: Not on file  Stress: Not on file  Social Connections: Not on file  Intimate Partner Violence: Not on file    Past Medical History, Surgical history, Social history, and Family history were reviewed and updated as appropriate.   Please see review of systems for further details on the patient's review from today.   Objective:   Physical Exam:  There were no  vitals taken for this visit.  Physical Exam Constitutional:      General: She is not in acute distress.    Appearance: She is well-developed.  Musculoskeletal:        General: No deformity.  Neurological:     Mental Status: She is alert and oriented to person, place, and time.     Motor: No tremor.     Coordination: Coordination normal.     Gait: Gait normal.  Psychiatric:        Attention and Perception: Attention normal. She is attentive.        Mood and Affect: Mood is anxious. Mood is not depressed. Affect is not labile, blunt, angry or inappropriate.        Speech: Speech normal. Speech is not rapid and pressured.        Behavior: Behavior normal.        Thought Content: Thought content normal. Thought content does not include homicidal or suicidal ideation. Thought content does not include homicidal or  suicidal plan.        Cognition and Memory: Cognition normal. She does not exhibit impaired recent memory.        Judgment: Judgment normal.     Comments: Insight is good. Residual anxiety issues probably better     Lab Review:     Component Value Date/Time   NA 142 03/11/2023 0934   K 4.2 03/11/2023 0934   CL 103 03/11/2023 0934   CO2 27 03/11/2023 0934   GLUCOSE 90 03/11/2023 0934   GLUCOSE 110 (H) 02/07/2023 1454   BUN 9 03/11/2023 0934   CREATININE 0.70 03/11/2023 0934   CREATININE 0.80 01/12/2018 1031   CALCIUM 9.4 03/11/2023 0934   PROT 6.8 03/11/2023 0934   ALBUMIN 4.3 03/11/2023 0934   AST 23 03/11/2023 0934   ALT 19 03/11/2023 0934   ALKPHOS 96 03/11/2023 0934   BILITOT 0.3 03/11/2023 0934   GFRNONAA >60 02/07/2023 1454   GFRNONAA 77 01/12/2018 1031   GFRAA 86 11/03/2018 1029   GFRAA 89 01/12/2018 1031       Component Value Date/Time   WBC 7.2 02/07/2023 1454   RBC 4.42 02/07/2023 1454   HGB 12.2 02/07/2023 1454   HGB 12.1 11/06/2016 1545   HCT 38.0 02/07/2023 1454   HCT 37.8 11/06/2016 1545   PLT 336 02/07/2023 1454   PLT 281 11/06/2016 1545    MCV 86.0 02/07/2023 1454   MCV 89 11/06/2016 1545   MCH 27.6 02/07/2023 1454   MCHC 32.1 02/07/2023 1454   RDW 14.6 02/07/2023 1454   RDW 14.8 11/06/2016 1545   LYMPHSABS 1.5 12/24/2019 1505   MONOABS 0.6 12/24/2019 1505   EOSABS 0.1 12/24/2019 1505   BASOSABS 0.0 12/24/2019 1505    No results found for: "POCLITH", "LITHIUM"   No results found for: "PHENYTOIN", "PHENOBARB", "VALPROATE", "CBMZ"   .res Assessment: Plan:    Primary insomnia - Plan: QUEtiapine (SEROQUEL) 25 MG tablet  Word-finding problems May have underlying ADD but risk benefit of starting stimulants at her age are not ideal  30 min Supportive therapy on dealing with worries chronically over sleep and memory for herself and concerns re: H's memory too.     Disc chronic worry over her own memory issues with normal neuropsych testing.  Normalized some cognitive decline with age.  She recognizes that distractions that interfere with conc interfere with memory and works to minimize that now.  Disc risk of sleep meds and risk of not taking them and having poor sleep.  Disc sleep hygiene. Adequate sleep is important for brain health.  Re: quetiapine low dosage off label for sleep bc of TR insomnia and multiple other med failures.   It has been successful generally.  Discussed potential metabolic side effects associated with atypical antipsychotics, as well as potential risk for movement side effects. Advised pt to contact office if movement side effects occur.   Overall sleep managed but cannot without quetiapine  Continue low dose quetiapine 25 mg HS for TR insomnia bc clearly helpful  Option donepezil but more GI risk than NAC  Supportive therapy dealing with concerns about H's judgment and memory issues.  He had normal neuropsych testing and does not currently have Alzheimer's dz.    FU 1 year  Meredith Staggers, MD, DFAPA  Please see After Visit Summary for patient specific instructions.  Future Appointments   Date Time Provider Department Center  07/10/2023  7:50 AM Drema Dallas, DO [facility] None  04/08/2024  1:00 PM Cottle,  Steva Ready., MD [facility] None    No orders of the defined types were placed in this encounter.     -------------------------------

## 2023-04-09 ENCOUNTER — Other Ambulatory Visit: Payer: Self-pay

## 2023-04-09 DIAGNOSIS — F5101 Primary insomnia: Secondary | ICD-10-CM

## 2023-04-09 MED ORDER — QUETIAPINE FUMARATE 25 MG PO TABS: 25.0000 mg | ORAL_TABLET | Freq: Every day | ORAL | 0 refills | Status: DC

## 2023-04-14 DIAGNOSIS — D485 Neoplasm of uncertain behavior of skin: Secondary | ICD-10-CM | POA: Diagnosis not present

## 2023-04-14 DIAGNOSIS — L570 Actinic keratosis: Secondary | ICD-10-CM | POA: Diagnosis not present

## 2023-04-14 DIAGNOSIS — D0471 Carcinoma in situ of skin of right lower limb, including hip: Secondary | ICD-10-CM | POA: Diagnosis not present

## 2023-04-21 DIAGNOSIS — M546 Pain in thoracic spine: Secondary | ICD-10-CM | POA: Diagnosis not present

## 2023-04-29 NOTE — Progress Notes (Unsigned)
NEUROLOGY CONSULTATION NOTE  [name] MRN: [id number] DOB: [birth year]  Referring provider: Evangeline Dakin, PA Primary care provider: Evangeline Dakin, [place]  Reason for consult:  headache  Assessment/Plan:   Primary stabbing headache  Currently mild, infrequent and manageable.  She does not feel pharmacologic management is warranted at this time.  She may consider starting melatonin 3mg  to 10mg  at bedtime if needed.  If headaches increase, she was advised to follow up.  Total time spent in chart reviewing notes and imaging and face to face with patient:  46 minutes   Subjective:  [name] is a 72 year old right-handed female with coronary atherosclerosis, HTN, HLD, fatty liver, heart murmur, stress-induced cardiomyopathy, IBS, fibromyalgia, generalized anxiety disorder, and history of TIA and bladder cancer who presents for headaches.  History supplemented by ED and referring provider's notes.  MRIs and CTA from ED personally reviewed.  During the last week of August, she developed spiking pain in the right eye and right temple, lasting several minutes up to an hour.  It would be recurrent.  No associated nausea, vomiting, visual disturbance, ptosis, conjunctival injection, lacrimation, rhinorrhea, nasal congestion, allodynia over the eye or temple, neck pain or unilateral numbness or weakness.  Leaning over tended to aggravate it.  She has no prior history of headaches of any kind.  She was seen in the ED on 02/07/2023.  MRI/MRV brain and orbits with and without contrast were normal.  CTA head and neck was also normal.  Sed rate was mildly elevated at 32.  She had been having labile blood pressure with SBP sometimes 170s.  Followed up with cardiology who prescribed hydralazine as needed. At the time, she was experiencing a great deal of family stress.  Since then, it has significantly reduced.  She may notice a mild stab every now and then lasting just a couple of seconds, but  manageable.     Current NSAIDS/analgesics:  ASA 81mg , tramadol Current triptans:  none Current ergotamine:  none Current anti-emetic:  none Current muscle relaxants:  Flexeril 10mg  Current Antihypertensive medications:  nebivolol, hydralazine Current Antidepressant medications:  none Current Anticonvulsant medications:  none Current anti-CGRP:  none Current antihistamine/decongestant:  Flonase Other therapy:  none Other medications:  Seroquel 25mg  at bedtime, Ranexa, NTG   PAST MEDICAL HISTORY: Past Medical History:  Diagnosis Date   Anxiety    Psychiatry Dr. Lucie Leather, concerns for memory loss, anxiety   Arthritis    Broken heart syndrome    Cancer (HCC)    bladder CA   Cataract    Coronary atherosclerosis CARDIOLOGIST-  DR Verdis Prime   Cath 11/2006 demonstrating moderate obstructive coronary disease with 70-80% septal perforator #1, 50-70% first diagonal, 80% third diagonal, 50% mid and distal LAD & heavy calcification throughout all 3 coronaries. LVF normal.   Essential hypertension, benign    Fatty liver 04/28/2012   GERD (gastroesophageal reflux disease)    Heart murmur    History of esophageal spasm    takes ranexa   History of TIA (transient ischemic attack)    2011--  no residuals   Hyperlipidemia    IBS (irritable bowel syndrome)    Diarrhea predominent, esophageal spasm severe, GERD - Dr. Julio Alm   Insomnia    Interstitial cystitis    Dr. Patsi Sears   Memory loss    Psychiatry Dr. Lucie Leather, concerns for memory loss, anxiety   PONV (postoperative nausea and vomiting)    AND URINARY RETENTION  TMJ (temporomandibular joint syndrome)    Wears glasses     PAST SURGICAL HISTORY: Past Surgical History:  Procedure Laterality Date   ABDOMINAL HYSTERECTOMY  AGE 87 -- 1989   APPENDECTOMY  1971   AND OVARIAN CYSTECTOMY   BENIGN EXCISIONAL BREAST BX  1996   BREAST EXCISIONAL BIOPSY Right 1992   CARDIAC CATHETERIZATION  11-28-2006   DR Verdis Prime     moderate cad/  70-80% septal perforator #1, 50-70% first diagonal, 80% third diagonal, 50% mid and distal LAD & heavy calcification throughout all 3 coronaries. LVF normal.   CARDIAC CATHETERIZATION  03-26-2012   DR Verdis Prime   patent CFX,  RCA,  & pLAD/  mLAD >50% to <70% a focal eccentric region that overlaps the third diagonal/  90% diagonal ostial and unchanged from prior study /   ef 65%   CARPAL TUNNEL RELEASE Bilateral    CYSTO WITH HYDRODISTENSION N/A 01/14/2014   Procedure: CYSTOSCOPY/HYDRODISTENSION/INSERTION OF MARCAINE AND PYRIDUIM INTO BLADDER/INJECTION OF MARCAINE AND KENALOG into sub trigone space ;  Surgeon: Kathi Ludwig, MD;  Location: [facility];  Service: Urology;  Laterality: N/A;   CYSTO/  HYDRODISTENTION/  INSTILLATION THERAPY  03/12/2010   ESOPHAGOGASTRODUODENOSCOPY  04/30/2012   Procedure: ESOPHAGOGASTRODUODENOSCOPY (EGD);  Surgeon: Hart Carwin, MD;  Location: [facility];  Service: Endoscopy;  Laterality: N/A;   EYE SURGERY Bilateral 2022   KNEE ARTHROSCOPY WITH MEDIAL MENISECTOMY Left 07/12/2021   Procedure: KNEE ARTHROSCOPY WITH PARTIAL MEDIAL MENISECTOMY, CHONDROPLASTY;  Surgeon: Durene Romans, MD;  Location: [facility];  Service: Orthopedics;  Laterality: Left;   LEFT HEART CATH AND CORONARY ANGIOGRAPHY N/A 02/18/2017   Procedure: LEFT HEART CATH AND CORONARY ANGIOGRAPHY;  Surgeon: Lyn Records, MD;  Location: [facility];  Service: Cardiovascular;  Laterality: N/A;   LUMBAR DISC SURGERY  1973   SHOULDER ARTHROSCOPY Left 12/23/2011   SHOULDER SURGERY Right 2010   TRANSTHORACIC ECHOCARDIOGRAM  04/21/2012   GRADE I DIASTOLIC DYSFUNCTION/  EF 60-65%/  MILD MR  &  TR   TRANSURETHRAL RESECTION OF BLADDER TUMOR Right 01/03/2015   Procedure: TRANSURETHRAL RESECTION OF BLADDER TUMOR (TURBT);  Surgeon: Jethro Bolus, MD;  Location: [facility];  Service: Urology;  Laterality: Right;   TRIGGER FINGER RELEASE Bilateral 2017     MEDICATIONS: Current Outpatient Medications on File Prior to Visit  Medication Sig Dispense Refill   acyclovir (ZOVIRAX) 400 MG tablet Take 400 mg by mouth as needed.     ARTIFICIAL TEAR SOLUTION OP Place 1 drop into both eyes 2 (two) times daily.     Aspirin 81 MG CAPS Take 81 mg by mouth daily.     atorvastatin (LIPITOR) 40 MG tablet Take 1 tablet (40 mg total) by mouth daily. 90 tablet 2   Bacillus Coagulans-Inulin (PROBIOTIC-PREBIOTIC PO) Take 1 capsule by mouth in the morning and at bedtime.     Cholecalciferol (VITAMIN D) 50 MCG (2000 UT) tablet Take 2,000 Units by mouth daily.     cyclobenzaprine (FLEXERIL) 10 MG tablet Take 10 mg by mouth daily as needed for muscle spasms.     dicyclomine (BENTYL) 10 MG capsule TAKE 1 CAPSULE TWICE DAILY FOR SPASM(S) AS NEEDED 180 capsule 3   Digestive Enzymes (MULTI-ENZYME) TABS Take 1 tablet by mouth daily as needed (acidic food/beans/broccoli).     diltiazem 2 % GEL Apply 1 Application topically as needed. For rectal spasm 30 g 1   ezetimibe (ZETIA) 10 MG tablet Take 1  tablet (10 mg total) by mouth daily. 90 tablet 3   fluticasone (FLONASE) 50 MCG/ACT nasal spray 1 spray as needed.     hydrALAZINE (APRESOLINE) 25 MG tablet Take one (1) tablet by mouth ( 25 mg) up to three times daily for systolic BP > 160. 90 tablet 1   hyoscyamine (LEVSIN SL) 0.125 MG SL tablet DISSOLVE 1 TABLET UNDER THE TONGUE EVERY DAY AS NEEDED 90 tablet 3   linaclotide (LINZESS) 72 MCG capsule Take 1 capsule (72 mcg total) by mouth daily before breakfast. 30 capsule 3   nebivolol (BYSTOLIC) 2.5 MG tablet Take 1 tablet (2.5 mg total) by mouth daily. 90 tablet 3   NEXIUM 40 MG capsule BRAND NAME ONLY- take 1 capsule daily as directed 90 capsule 3   nitroGLYCERIN (NITROSTAT) 0.4 MG SL tablet place 1 tablet under the tongue if needed every 5 minutes for chest pain for 3 doses IF NO RELIEF AFTER 3RD DOSE CALL PRESCRIBER OR 911. 25 tablet 3   Peppermint Oil 90 MG CPCR Take 2  capsules by mouth as needed (indigestion).     QUEtiapine (SEROQUEL) 25 MG tablet Take 1 tablet (25 mg total) by mouth at bedtime. 30 tablet 0   ranolazine (RANEXA) 500 MG 12 hr tablet Take 1 tablet (500 mg total) by mouth 2 (two) times daily. 180 tablet 1   sucralfate (CARAFATE) 1 g tablet Take 1 tablet (1 g total) by mouth 3 (three) times daily. As needed 90 tablet 3   traMADol (ULTRAM) 50 MG tablet Take by mouth.     No current facility-administered medications on file prior to visit.    ALLERGIES: Allergies  Allergen Reactions   Clindamycin/Lincomycin Other (See Comments)    Pt not sure of reaction    Doxycycline Nausea And Vomiting   Escitalopram Oxalate Nausea Only   Levsinex Timecaps [Hyoscyamine] Itching   Macrodantin Other (See Comments)    Flushed, hot   Sulfa Antibiotics     Unknown reaction    Trazodone And Nefazodone Nausea Only   Trimethoprim Other (See Comments)    unknown    Epinephrine Palpitations   Lincomycin Rash   Nitrofurantoin Macrocrystal Anxiety and Rash    Flushing, hot face and ears    FAMILY HISTORY: Family History  Problem Relation Age of Onset   Heart disease Mother    Colon polyps Mother    Alzheimer's disease Mother    Dementia Mother    CAD Mother    Heart disease Father    Diverticulosis Father    CAD Father        in his 75s   Heart disease Brother    Osteoarthritis Other        Multiple family members   Obesity Other        Multiple family members   Depression Other        Multiple family members   Breast cancer Sister 34       Pam   Colon cancer Neg Hx    Esophageal cancer Neg Hx    Stomach cancer Neg Hx    Rectal cancer Neg Hx     Objective:  Blood pressure (!) 113/51, pulse 65, height 5\' 2"  (1.575 m), weight 131 lb (59.4 kg), SpO2 100%. General: No acute distress.  Patient appears well-groomed.   Head:  Normocephalic/atraumatic Eyes:  fundi examined but not visualized Neck: supple, no paraspinal tenderness, full  range of motion Back: No paraspinal tenderness Heart: regular rate and  rhythm Lungs: Clear to auscultation bilaterally. Vascular: No carotid bruits. Neurological Exam: Mental status: alert and oriented to person, place, and time, speech fluent and not dysarthric, language intact. Cranial nerves: CN I: not tested CN II: pupils equal, round and reactive to light, visual fields intact CN III, IV, VI:  full range of motion, no nystagmus, no ptosis CN V: facial sensation intact. CN VII: upper and lower face symmetric CN VIII: hearing intact CN IX, X: gag intact, uvula midline CN XI: sternocleidomastoid and trapezius muscles intact CN XII: tongue midline Bulk & Tone: normal, no fasciculations. Motor:  muscle strength 5/5 throughout Sensation:  Pinprick, temperature and vibratory sensation intact. Deep Tendon Reflexes:  2+ throughout,  toes downgoing.   Finger to nose testing:  Without dysmetria.   Heel to shin:  Without dysmetria.   Gait:  Normal station and stride.  Romberg negative.    Thank you for allowing me to take part in the care of this patient.  Shon Millet, DO  CC: Evangeline Dakin, [place]

## 2023-04-30 ENCOUNTER — Encounter: Payer: Self-pay | Admitting: Neurology

## 2023-04-30 ENCOUNTER — Ambulatory Visit: Payer: Medicare Other | Admitting: Neurology

## 2023-04-30 VITALS — BP 113/51 | HR 65 | Ht 62.0 in | Wt 131.0 lb

## 2023-04-30 DIAGNOSIS — G4485 Primary stabbing headache: Secondary | ICD-10-CM

## 2023-04-30 NOTE — Patient Instructions (Signed)
Consider taking melatonin 3-10mg  at bedtime. If they become more severe or frequent, follow up

## 2023-05-07 ENCOUNTER — Telehealth: Payer: Self-pay

## 2023-05-07 NOTE — Telephone Encounter (Signed)
   Name: [name]  DOB: [birth year]  MRN: [id number]  Primary Cardiologist: Lesleigh Noe, MD (Inactive)   Preoperative team, please contact this patient and set up a phone call appointment for further preoperative risk assessment. Please obtain consent and complete medication review. Thank you for your help.  I confirm that guidance regarding antiplatelet and oral anticoagulation therapy has been completed and, if necessary, noted below.  Patient's aspirin is not prescribed by cardiology.  Recommendations for holding aspirin will need to come from prescribing provider.  I also confirmed the patient resides in the state of [place]. As per [facility] Medical Board telemedicine laws, the patient must reside in the state in which the provider is licensed.   Ronney Asters, NP 05/07/2023, 2:43 PM [facility]

## 2023-05-07 NOTE — Telephone Encounter (Signed)
   Pre-operative Risk Assessment    Patient Name: [name]  DOB: 05/23/1951 MRN: [id number]      Request for Surgical Clearance    Procedure:   Left ring finger trigger release/ A1 pulley  Date of Surgery:  Clearance 06/26/23                                 Surgeon:  Dominica Severin, MD Surgeon's Group or Practice Name:  [facility] Phone number:  [phone] Fax number:  [phone]   Type of Clearance Requested:   - Medical  - Pharmacy:  Hold Aspirin     Type of Anesthesia:   none listed waiting for a response   Additional requests/questions:   n/a  Rondel Baton   05/07/2023, 12:40 PM

## 2023-05-12 ENCOUNTER — Telehealth: Payer: Self-pay | Admitting: *Deleted

## 2023-05-12 ENCOUNTER — Other Ambulatory Visit: Payer: Self-pay | Admitting: *Deleted

## 2023-05-12 MED ORDER — ATORVASTATIN CALCIUM 40 MG PO TABS: 40.0000 mg | ORAL_TABLET | Freq: Every day | ORAL | 2 refills | Status: DC

## 2023-05-12 NOTE — Telephone Encounter (Signed)
Pt has been scheduled tele pre op appt 06/12/23. Med rec and consent are done.     Patient Consent for Virtual Visit        [name] has provided verbal consent on 05/12/2023 for a virtual visit (video or telephone).   CONSENT FOR VIRTUAL VISIT FOR:  [name]  By participating in this virtual visit I agree to the following:  I hereby voluntarily request, consent and authorize [facility] and its employed or contracted physicians, physician assistants, nurse practitioners or other licensed health care professionals (the Practitioner), to provide me with telemedicine health care services (the "Services") as deemed necessary by the treating Practitioner. I acknowledge and consent to receive the Services by the Practitioner via telemedicine. I understand that the telemedicine visit will involve communicating with the Practitioner through live audiovisual communication technology and the disclosure of certain medical information by electronic transmission. I acknowledge that I have been given the opportunity to request an in-person assessment or other available alternative prior to the telemedicine visit and am voluntarily participating in the telemedicine visit.  I understand that I have the right to withhold or withdraw my consent to the use of telemedicine in the course of my care at any time, without affecting my right to future care or treatment, and that the Practitioner or I may terminate the telemedicine visit at any time. I understand that I have the right to inspect all information obtained and/or recorded in the course of the telemedicine visit and may receive copies of available information for a reasonable fee.  I understand that some of the potential risks of receiving the Services via telemedicine include:  Delay or interruption in medical evaluation due to technological equipment failure or disruption; Information transmitted may not be sufficient (e.g. poor  resolution of images) to allow for appropriate medical decision making by the Practitioner; and/or  In rare instances, security protocols could fail, causing a breach of personal health information.  Furthermore, I acknowledge that it is my responsibility to provide information about my medical history, conditions and care that is complete and accurate to the best of my ability. I acknowledge that Practitioner's advice, recommendations, and/or decision may be based on factors not within their control, such as incomplete or inaccurate data provided by me or distortions of diagnostic images or specimens that may result from electronic transmissions. I understand that the practice of medicine is not an exact science and that Practitioner makes no warranties or guarantees regarding treatment outcomes. I acknowledge that a copy of this consent can be made available to me via my patient portal [facility] MyChart), or I can request a printed copy by calling the office of [facility].    I understand that my insurance will be billed for this visit.   I have read or had this consent read to me. I understand the contents of this consent, which adequately explains the benefits and risks of the Services being provided via telemedicine.  I have been provided ample opportunity to ask questions regarding this consent and the Services and have had my questions answered to my satisfaction. I give my informed consent for the services to be provided through the use of telemedicine in my medical care

## 2023-05-12 NOTE — Telephone Encounter (Signed)
Pt has been scheduled tele pre op appt 06/12/23. Med rec and consent are done.

## 2023-05-12 NOTE — Telephone Encounter (Addendum)
Pt states in regard to ASA not being handled by cardiology per Benancio Deeds; pt states that cardiology is who manages her ASA. Pt asked for a refill on her Atorvastatin to [facility] x 90 dys. I will send Rx for the pt.

## 2023-05-15 DIAGNOSIS — M546 Pain in thoracic spine: Secondary | ICD-10-CM | POA: Diagnosis not present

## 2023-05-24 ENCOUNTER — Other Ambulatory Visit: Payer: Self-pay | Admitting: Nurse Practitioner

## 2023-05-27 DIAGNOSIS — J479 Bronchiectasis, uncomplicated: Secondary | ICD-10-CM | POA: Diagnosis not present

## 2023-05-27 DIAGNOSIS — R5383 Other fatigue: Secondary | ICD-10-CM | POA: Diagnosis not present

## 2023-05-27 DIAGNOSIS — K219 Gastro-esophageal reflux disease without esophagitis: Secondary | ICD-10-CM | POA: Diagnosis not present

## 2023-05-27 DIAGNOSIS — I25118 Atherosclerotic heart disease of native coronary artery with other forms of angina pectoris: Secondary | ICD-10-CM | POA: Diagnosis not present

## 2023-05-27 DIAGNOSIS — Z0000 Encounter for general adult medical examination without abnormal findings: Secondary | ICD-10-CM | POA: Diagnosis not present

## 2023-05-27 DIAGNOSIS — E785 Hyperlipidemia, unspecified: Secondary | ICD-10-CM | POA: Diagnosis not present

## 2023-05-27 DIAGNOSIS — I700 Atherosclerosis of aorta: Secondary | ICD-10-CM | POA: Diagnosis not present

## 2023-05-28 MED ORDER — ATORVASTATIN CALCIUM 40 MG PO TABS: 40.0000 mg | ORAL_TABLET | Freq: Every day | ORAL | 2 refills | Status: DC

## 2023-05-28 NOTE — Telephone Encounter (Signed)
Called and spoke with Pt to ask which medication she is needing a refill on. Pt states she needs a refill sent to [facility] for atorvastatin 40mg .  This was last sent in on 05/12/23, Pt states [facility] told her they never received the refill request. Will resend.  Pt verbalized understanding and expressed appreciation for call.

## 2023-06-12 ENCOUNTER — Encounter: Payer: Self-pay | Admitting: Nurse Practitioner

## 2023-06-12 ENCOUNTER — Ambulatory Visit: Payer: Medicare Other | Attending: Nurse Practitioner | Admitting: Nurse Practitioner

## 2023-06-12 DIAGNOSIS — Z01810 Encounter for preprocedural cardiovascular examination: Secondary | ICD-10-CM

## 2023-06-12 NOTE — Progress Notes (Signed)
 Virtual Visit via Telephone Note   Because of [name]'s co-morbid illnesses, she is at least at moderate risk for complications without adequate follow up.  This format is felt to be most appropriate for this patient at this time.  The patient did not have access to video technology/had technical difficulties with video requiring transitioning to audio format only (telephone).  All issues noted in this document were discussed and addressed.  No physical exam could be performed with this format.  Please refer to the patient's chart for her consent to telehealth for [name].  Evaluation Performed:  Preoperative cardiovascular risk assessment _____________   Date:  06/12/2023   Patient ID:  [name], DOB 06/28/1950, MRN [id number] Patient Location:  Home Provider location:   Office  Primary Care Provider:  Cleotilde, Virginia  E, PA Primary Cardiologist:  Victory LELON Claudene DOUGLAS, MD (Inactive)  Chief Complaint / Patient Profile   73 y.o. y/o female with a h/o labile blood pressure, coronary artery disease, palpitations, hypertension, NICM, dyslipidemia who is pending left ring finger trigger release/A1 pulley with Dr. Camella on 06/26/2023 and presents today for telephonic preoperative cardiovascular risk assessment.  History of Present Illness    [name] is a 73 y.o. female who presents via audio/video conferencing for a telehealth visit today.  Pt was last seen in cardiology clinic on 02/21/2023 by Rosaline Bane, NP.  At that time [name] was doing well.  The patient is now pending procedure as outlined above. Since her last visit, she denies chest pain, shortness of breath, lower extremity edema, fatigue, palpitations, melena, hematuria, hemoptysis, diaphoresis, weakness, presyncope, syncope, orthopnea, and PND. She remains active at home as well as regular workouts on the elliptical machine and does not have any concerning cardiac symptoms.   Past  Medical History    Past Medical History:  Diagnosis Date   Anxiety    Psychiatry Dr. Edger Macintosh, concerns for memory loss, anxiety   Arthritis    Broken heart syndrome    Cancer (HCC)    bladder CA   Cataract    Coronary atherosclerosis CARDIOLOGIST-  DR VICTORY CLAUDENE   Cath 11/2006 demonstrating moderate obstructive coronary disease with 70-80% septal perforator #1, 50-70% first diagonal, 80% third diagonal, 50% mid and distal LAD & heavy calcification throughout all 3 coronaries. LVF normal.   Essential hypertension, benign    Fatty liver 04/28/2012   GERD (gastroesophageal reflux disease)    Heart murmur    History of esophageal spasm    takes ranexa    History of TIA (transient ischemic attack)    2011--  no residuals   Hyperlipidemia    IBS (irritable bowel syndrome)    Diarrhea predominent, esophageal spasm severe, GERD - Dr. Princella People   Insomnia    Interstitial cystitis    Dr. Chales   Memory loss    Psychiatry Dr. Edger Macintosh, concerns for memory loss, anxiety   PONV (postoperative nausea and vomiting)    AND URINARY RETENTION   TMJ (temporomandibular joint syndrome)    Wears glasses    Past Surgical History:  Procedure Laterality Date   ABDOMINAL HYSTERECTOMY  AGE 15 -- 1989   APPENDECTOMY  1971   AND OVARIAN CYSTECTOMY   BENIGN EXCISIONAL BREAST BX  1996   BREAST EXCISIONAL BIOPSY Right 1992   CARDIAC CATHETERIZATION  11-28-2006   DR VICTORY SMITH    moderate cad/  70-80% septal perforator #1, 50-70% first diagonal, 80%  third diagonal, 50% mid and distal LAD & heavy calcification throughout all 3 coronaries. LVF normal.   CARDIAC CATHETERIZATION  03-26-2012   DR VICTORY SHARPS   patent CFX,  RCA,  & pLAD/  mLAD >50% to <70% a focal eccentric region that overlaps the third diagonal/  90% diagonal ostial and unchanged from prior study /   ef 65%   CARPAL TUNNEL RELEASE Bilateral    CYSTO WITH HYDRODISTENSION N/A 01/14/2014   Procedure:  CYSTOSCOPY/HYDRODISTENSION/INSERTION OF MARCAINE  AND PYRIDUIM INTO BLADDER/INJECTION OF MARCAINE  AND KENALOG  into sub trigone space ;  Surgeon: Arlena LILLETTE Gal, MD;  Location: [facility];  Service: Urology;  Laterality: N/A;   CYSTO/  HYDRODISTENTION/  INSTILLATION THERAPY  03/12/2010   ESOPHAGOGASTRODUODENOSCOPY  04/30/2012   Procedure: ESOPHAGOGASTRODUODENOSCOPY (EGD);  Surgeon: Princella CHRISTELLA Nida, MD;  Location: [facility];  Service: Endoscopy;  Laterality: N/A;   EYE SURGERY Bilateral 2022   KNEE ARTHROSCOPY WITH MEDIAL MENISECTOMY Left 07/12/2021   Procedure: KNEE ARTHROSCOPY WITH PARTIAL MEDIAL MENISECTOMY, CHONDROPLASTY;  Surgeon: Ernie Cough, MD;  Location: [facility];  Service: Orthopedics;  Laterality: Left;   LEFT HEART CATH AND CORONARY ANGIOGRAPHY N/A 02/18/2017   Procedure: LEFT HEART CATH AND CORONARY ANGIOGRAPHY;  Surgeon: Sharps Victory ORN, MD;  Location: [facility];  Service: Cardiovascular;  Laterality: N/A;   LUMBAR DISC SURGERY  1973   SHOULDER ARTHROSCOPY Left 12/23/2011   SHOULDER SURGERY Right 2010   TRANSTHORACIC ECHOCARDIOGRAM  04/21/2012   GRADE I DIASTOLIC DYSFUNCTION/  EF 60-65%/  MILD MR  &  TR   TRANSURETHRAL RESECTION OF BLADDER TUMOR Right 01/03/2015   Procedure: TRANSURETHRAL RESECTION OF BLADDER TUMOR (TURBT);  Surgeon: Arlena Gal, MD;  Location: [name];  Service: Urology;  Laterality: Right;   TRIGGER FINGER RELEASE Bilateral 2017    Allergies  Allergies  Allergen Reactions   Clindamycin/Lincomycin Other (See Comments)    Pt not sure of reaction    Doxycycline Nausea And Vomiting   Escitalopram Oxalate Nausea Only   Levsinex Timecaps [Hyoscyamine ] Itching   Macrodantin Other (See Comments)    Flushed, hot   Sulfa Antibiotics     Unknown reaction    Trazodone And Nefazodone Nausea Only   Trimethoprim Other (See Comments)    unknown    Epinephrine  Palpitations   Lincomycin Rash   Nitrofurantoin  Macrocrystal Anxiety and Rash    Flushing, hot face and ears    Home Medications    Prior to Admission medications   Medication Sig Start Date End Date Taking? Authorizing Provider  acyclovir  (ZOVIRAX ) 400 MG tablet Take 400 mg by mouth as needed.    [provider]  ARTIFICIAL TEAR SOLUTION OP Place 1 drop into both eyes 2 (two) times daily.    [provider]  Aspirin  81 MG CAPS Take 81 mg by mouth daily.    [provider]  atorvastatin  (LIPITOR ) 40 MG tablet Take 1 tablet (40 mg total) by mouth daily. 05/28/23   Camdynn Maranto, Rosaline CHRISTELLA, NP  Bacillus Coagulans-Inulin (PROBIOTIC-PREBIOTIC PO) Take 1 capsule by mouth in the morning and at bedtime.    [provider]  Cholecalciferol  (VITAMIN D ) 50 MCG (2000 UT) tablet Take 2,000 Units by mouth daily.    [provider]  cyclobenzaprine (FLEXERIL) 10 MG tablet Take 10 mg by mouth daily as needed for muscle spasms. 04/26/16   [provider]  dicyclomine  (BENTYL ) 10 MG capsule TAKE 1 CAPSULE TWICE DAILY FOR SPASM(S) AS  NEEDED 05/16/22   Nandigam, Kavitha V, MD  Digestive Enzymes (MULTI-ENZYME) TABS Take 1 tablet by mouth daily as needed (acidic food/beans/broccoli).    [provider]  diltiazem  2 % GEL Apply 1 Application topically as needed. For rectal spasm 12/28/21   Nandigam, Kavitha V, MD  ezetimibe  (ZETIA ) 10 MG tablet Take 1 tablet (10 mg total) by mouth daily. 10/17/21   Soliyana Mcchristian, Rosaline HERO, NP  fluticasone (FLONASE) 50 MCG/ACT nasal spray 1 spray as needed. 10/29/16   [provider]  hydrALAZINE  (APRESOLINE ) 25 MG tablet Take one (1) tablet by mouth ( 25 mg) up to three times daily for systolic BP > 160. 02/21/23   Cortlandt Capuano, Rosaline HERO, NP  hyoscyamine  (LEVSIN  SL) 0.125 MG SL tablet DISSOLVE 1 TABLET UNDER THE TONGUE EVERY DAY AS NEEDED 05/16/22   Nandigam, Kavitha V, MD  linaclotide  (LINZESS ) 72 MCG capsule Take 1 capsule (72 mcg total) by mouth daily before breakfast.  10/01/22   Nandigam, Kavitha V, MD  nebivolol  (BYSTOLIC ) 2.5 MG tablet Take 1 tablet (2.5 mg total) by mouth daily. 09/02/22   Ovid Witman, Rosaline HERO, NP  NEXIUM  40 MG capsule BRAND NAME ONLY- take 1 capsule daily as directed 09/13/21   Nandigam, Kavitha V, MD  nitroGLYCERIN  (NITROSTAT ) 0.4 MG SL tablet place 1 tablet under the tongue if needed every 5 minutes for chest pain for 3 doses IF NO RELIEF AFTER 3RD DOSE CALL PRESCRIBER OR 911. 12/20/21   Claudene Victory ORN, MD  Peppermint Oil 90 MG CPCR Take 2 capsules by mouth as needed (indigestion).    [provider]  QUEtiapine  (SEROQUEL ) 25 MG tablet Take 1 tablet (25 mg total) by mouth at bedtime. 04/09/23   Cottle, Lorene KANDICE Raddle., MD  ranolazine  (RANEXA ) 500 MG 12 hr tablet TAKE 1 TABLET TWICE A DAY 05/26/23   Whit Bruni, Rosaline HERO, NP  traMADol  (ULTRAM ) 50 MG tablet Take 50 mg by mouth as needed. 07/12/21   [provider]    Physical Exam    Vital Signs:  [name] does not have vital signs available for review today.  Given telephonic nature of communication, physical exam is limited. AAOx3. NAD. Normal affect.  Speech and respirations are unlabored.  Accessory Clinical Findings    None  Assessment & Plan    1.  Preoperative Cardiovascular Risk Assessment: According to the Revised Cardiac Risk Index (RCRI), her Perioperative Risk of Major Cardiac Event is (%): 0.9. Her Functional Capacity in METs is: 8.23 according to the Duke Activity Status Index (DASI). The patient is doing well from a cardiac perspective. Therefore, based on ACC/AHA guidelines, the patient would be at acceptable risk for the planned procedure without further cardiovascular testing.   The patient was advised that if she develops new symptoms prior to surgery to contact our office to arrange for a follow-up visit, and she verbalized understanding.  Per office protocol, he may hold aspirin  for 5-7 days prior to procedure and should resume as soon as  hemodynamically stable postoperatively.  A copy of this note will be routed to requesting surgeon.  Time:   Today, I have spent 10 minutes with the patient with telehealth technology discussing medical history, symptoms, and management plan.    Rosaline EMERSON Bane, NP-C  06/12/2023, 2:39 PM [address] Office [phone] Fax [phone]

## 2023-06-16 ENCOUNTER — Encounter: Payer: Self-pay | Admitting: Gastroenterology

## 2023-06-26 DIAGNOSIS — M65342 Trigger finger, left ring finger: Secondary | ICD-10-CM | POA: Diagnosis not present

## 2023-06-26 DIAGNOSIS — Z4789 Encounter for other orthopedic aftercare: Secondary | ICD-10-CM | POA: Diagnosis not present

## 2023-07-01 DIAGNOSIS — H6992 Unspecified Eustachian tube disorder, left ear: Secondary | ICD-10-CM | POA: Diagnosis not present

## 2023-07-01 DIAGNOSIS — H93292 Other abnormal auditory perceptions, left ear: Secondary | ICD-10-CM | POA: Diagnosis not present

## 2023-07-01 DIAGNOSIS — H938X2 Other specified disorders of left ear: Secondary | ICD-10-CM | POA: Diagnosis not present

## 2023-07-01 DIAGNOSIS — H6122 Impacted cerumen, left ear: Secondary | ICD-10-CM | POA: Diagnosis not present

## 2023-07-02 DIAGNOSIS — H903 Sensorineural hearing loss, bilateral: Secondary | ICD-10-CM | POA: Diagnosis not present

## 2023-07-07 DIAGNOSIS — D649 Anemia, unspecified: Secondary | ICD-10-CM | POA: Diagnosis not present

## 2023-07-08 DIAGNOSIS — C674 Malignant neoplasm of posterior wall of bladder: Secondary | ICD-10-CM | POA: Diagnosis not present

## 2023-07-10 ENCOUNTER — Ambulatory Visit: Payer: Medicare Other | Admitting: Neurology

## 2023-07-10 DIAGNOSIS — M25642 Stiffness of left hand, not elsewhere classified: Secondary | ICD-10-CM | POA: Diagnosis not present

## 2023-08-12 DIAGNOSIS — L820 Inflamed seborrheic keratosis: Secondary | ICD-10-CM | POA: Diagnosis not present

## 2023-08-12 DIAGNOSIS — D0471 Carcinoma in situ of skin of right lower limb, including hip: Secondary | ICD-10-CM | POA: Diagnosis not present

## 2023-08-13 ENCOUNTER — Other Ambulatory Visit: Payer: Self-pay | Admitting: Nurse Practitioner

## 2023-08-23 NOTE — Progress Notes (Unsigned)
 Cardiology Office Note:  .   Date:  08/25/2023  ID:  [name], DOB 08-18-1950, MRN [id number] PCP: Collene Mares, PA  [facility] Providers Cardiologist:  Donato Schultz, MD    Patient Profile: .      PMH Coronary artery disease LHC 11/2006 Moderate nonobstructive CAD with 70-80% septal perforator #1 50-70% 1st diagonal 80% 3rd diagonal 50% mid and distal LAD Heavy calcification all 3 coronaries with normal LVEF LHC 02/18/2017 Abnormal LV wall motion suggesting stress cardiomyopathy with atypical features (preserved apex function EF 25-35% with mildly elevated LVEDP Severe diffuse calcification of prox to mid LAD 30-40% pLAD 60-70% mLAD 80-90% oLAD, first diagonal Mild luminal irregularities in the right coronary and circumflex coronary arteries Hypertension Hyperlipidemia Bladder cancer Anxiety disorder Palpitations  Admission 2008 for chest pain and CT demonstrating evidence moderate obstructive CAD with 70 to 80% septal perforator #1, 50 to 70% first diagonal, 80% third diagonal, 50% mid and distal LAD and healthy calcification throughout all 3 coronaries with normal LVEF. Management risk factor modification.   Cardiac cath 02/18/2017 as outlined above.  No significant change other than decrease in EF.  Medical management of stress cardiomyopathy recommended.  Echo 02/19/2017 with EF 40 to 45%, akinesis of distal anterior septal, anterolateral, inferolateral, and inferoseptal and apical myocardium with basal to mid sparing of contractile performance. Systolic pressure mildly increased, peak PA pressure 36 mmHg.   Cardiac monitor 02/2021 revealed NSR with occasional PACs and PVCs, with symptoms correlating.   Seen by me 10/17/21 at which time she had lingering cough from recent respiratory illness and was feeling fatigued.  Noted dyspnea on exertion with walking.  Taking Ranexa only prior to working out. Having symptoms of angina after going hard on the elliptical  for 9 to 12 minutes.  No chest discomfort with walking.  Was prescribed an inhaler by pulmonology to use prior to exercise but was not using it at that time.  Her symptoms were discussed with Dr. Katrinka Blazing, primary cardiologist, and no further testing was advised.   Seen in follow-up by me on 08/13/22 at which time she continued to have angina with exertion generally lasting for about 5:10 to 6 minutes of exercise.  Lengthy discussion about medical management. She was advised to take Ranexa 500 mg twice daily and monitor for improvement of symptoms.  She was continued on nebivolol, aspirin, atorvastatin, and ezetimibe.  BP was well-controlled.  LDL cholesterol was 55 on 19/14/7829. 6 month f/u was advised.   She contacted our office 02/18/23 with concerns about her blood pressure and was seen by me on 02/21/23. Reported having symptoms of viral illness, including COVID infection in June with continued possible bronchitis, sore throat, cough and congestion. On August 25th, she had severe throbbing pain behind her left eye. This lasted a couple of days.  She did not feel like working out during this time. Was seen at [facility] walk-in clinic and went to ER on August 30.  CT scan and MRI were negative, no acute concerns were identified. She did not take any OTC cold medication during her illness. Since that time, she has continued to have of pain in her eyes, and headache and got lightheaded and felt like she was going to pass out at the entrance of the [facility] on 02/13/2023.  On 02/14/2023, she was feeling "weird and jittery."  She checked her BP which was significantly elevated from her normal at 172/60. Repeat BP was 154/74. BP remained elevated until 02/20/23  when it returned to and has been maintained in the 120s or lower. Admits she was dealing with a stressful situation during this time. On one occasion at another provider's office, she noted her arms felt weak and her BP was low normal. Her husband is concerned about stress  causing these symptoms. 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 She feels that she manages her stress well. No concerning cardiac symptoms. Admitted to some dietary indiscretion with sodium, but nothing significant around the time of BP elevations.   Telephone assessment 06/12/23 for pending trigger finger surgery at which time she was feeling well and was felt to be at low risk for surgery.        History of Present Illness: .   [name] is a very pleasant 73 y.o. female who is here today for follow-up of CAD and hypertension. She reports she is noting increased fatigue, particularly after working out but it is not limiting her ability to complete a workout. She remains active with regular gym workouts and walking. Has noticed that she cannot lift as heavy as she could in the past. She has concerns about her cholesterol medication and its potential link to dementia. She is currently on Lipitor 40mg  and ezetimibe 10mg . She has been hearing about alternative views on cholesterol management. Admits that getting enough protein has been a long-time issue for her. She has a family history of heart disease and dementia, which increases her concern about potential cognitive side effects of her medication. Her blood pressure is well-controlled, with readings ranging from 111/48 to 138/65. She denies chest pain, shortness of breath, lower extremity edema, palpitations, presyncope, syncope, orthopnea, and PND.   ROS: See HPI       Studies Reviewed: Marland Kitchen   EKG Interpretation Date/Time:  Monday August 25 2023 09:41:02 EDT Ventricular Rate:  58 PR Interval:  174 QRS Duration:  94 QT Interval:  446 QTC Calculation: 437 R Axis:   47  Text Interpretation: Sinus bradycardia No acute changes Confirmed by Eligha Bridegroom [phone]) on 08/25/2023 9:51:02 AM     Risk Assessment/Calculations:             Physical Exam:   VS:  BP 118/68 (BP Location: Left Arm)    Pulse (!) 58   Ht 5' 2.5" (1.588 m)   Wt 130 lb 12.8 oz (59.3 kg)   SpO2 96%   BMI 23.54 kg/m    Wt Readings from Last 3 Encounters:  08/25/23 130 lb 12.8 oz (59.3 kg)  04/30/23 131 lb (59.4 kg)  02/21/23 131 lb 12.8 oz (59.8 kg)    GEN: Well nourished, well developed in no acute distress, appears younger than stated age NECK: No JVD; No carotid bruits CARDIAC: RRR, no murmurs, rubs, gallops RESPIRATORY:  Clear to auscultation without rales, wheezing or rhonchi  ABDOMEN: Soft, non-tender, non-distended EXTREMITIES:  No edema; No deformity     ASSESSMENT AND PLAN: .    Hypertension: She continues to have some fluctuations in BP but no significant elevations or episodes of hypotension. She is asymptomatic. Rare use of hydralazine which was provided for SBP > 160 mmHg. She feels well on current antihypertensive therapy of nebivolol. No changes in therapy today.   CAD: History of diffuse CAD by cath 2008. Longstanding history of angina with exertion. Today, she reports no angina or DOE with exertion or stress. Feels fatigued following workouts but she is able to complete  the workout. No symptoms concerning for angina. No indication for further ischemia evaluation at this time.  We will continue antianginal therapy with aspirin, nebivolol, Ranexa, along with atorvastatin and ezetimibe. Focus on secondary prevention including heart healthy mostly plant based diet avoiding saturated fat, processed foods, simple carbohydrates, and sugar along with aiming for at least 150 minutes of moderate intensity exercise each week.   NICM: Left heart cath 02/18/2017 with a EF 25 to 35% and abnormal LV wall motion suggesting stress cardiomyopathy with preserved apex function and mildly elevated LVEDP.  No significant CAD on cath. Echo 03/25/2017 revealed normal LV function. Repeat echo 03/11/23 with normal LVEF 55-60%, no rwma, normal diastolic parameters, normal RV, aortic valve sclerosis with no stenosis.  She  denies shortness of breath, edema, orthopnea, or PND.  Appears euvolemic on exam. Weight has been stable. No evidence of volume overload.   Palpitations: Quiescent at this time. Continue nebivolol.  Hyperlipidemia LDL goal < 70: Lipid panel completed 03/11/2023 with total cholesterol 152, HDL 62, LDL 68, and triglycerides 623. We discussed checking LP(a) due to family history of heart disease. We will get repeat lipid panel and LP(a) today. She is concerned that statin therapy can contribute to dementia and would like to be off statin or on a lower dose if possible. Advised I will provide some resources for her to review that supports cholesterol management as preventative for dementia. Contiue healthy lifestyle.        Disposition: 1 year with Dr. Anne Fu (transfer from Dr. Eldridge Dace)  Signed, Eligha Bridegroom, NP-C

## 2023-08-25 ENCOUNTER — Encounter: Payer: Self-pay | Admitting: Nurse Practitioner

## 2023-08-25 ENCOUNTER — Ambulatory Visit: Payer: Medicare Other | Attending: Nurse Practitioner | Admitting: Nurse Practitioner

## 2023-08-25 VITALS — BP 118/68 | HR 58 | Ht 62.5 in | Wt 130.8 lb

## 2023-08-25 DIAGNOSIS — E785 Hyperlipidemia, unspecified: Secondary | ICD-10-CM

## 2023-08-25 DIAGNOSIS — I10 Essential (primary) hypertension: Secondary | ICD-10-CM | POA: Diagnosis not present

## 2023-08-25 DIAGNOSIS — Z01810 Encounter for preprocedural cardiovascular examination: Secondary | ICD-10-CM | POA: Diagnosis not present

## 2023-08-25 DIAGNOSIS — R002 Palpitations: Secondary | ICD-10-CM | POA: Diagnosis not present

## 2023-08-25 DIAGNOSIS — I25118 Atherosclerotic heart disease of native coronary artery with other forms of angina pectoris: Secondary | ICD-10-CM | POA: Diagnosis not present

## 2023-08-25 DIAGNOSIS — I428 Other cardiomyopathies: Secondary | ICD-10-CM | POA: Diagnosis not present

## 2023-08-25 MED ORDER — NITROGLYCERIN 0.4 MG SL SUBL
SUBLINGUAL_TABLET | SUBLINGUAL | 3 refills | Status: DC
Start: 2023-08-25 — End: 2024-03-23

## 2023-08-25 NOTE — Patient Instructions (Signed)
 Medication Instructions:   Your physician recommends that you continue on your current medications as directed. Please refer to the Current Medication list given to you today.   *If you need a refill on your cardiac medications before your next appointment, please call your pharmacy*   Lab Work:  TODAY!!!!! LIPID/CMET/LPA  If you have labs (blood work) drawn today and your tests are completely normal, you will receive your results only by: MyChart Message (if you have MyChart) OR A paper copy in the mail If you have any lab test that is abnormal or we need to change your treatment, we will call you to review the results.   Testing/Procedures:  None ordered.   Follow-Up: At [facility], you and your health needs are our priority.  As part of our continuing mission to provide you with exceptional heart care, we have created designated Provider Care Teams.  These Care Teams include your primary Cardiologist (physician) and Advanced Practice Providers (APPs -  Physician Assistants and Nurse Practitioners) who all work together to provide you with the care you need, when you need it.  We recommend signing up for the patient portal called "MyChart".  Sign up information is provided on this After Visit Summary.  MyChart is used to connect with patients for Virtual Visits (Telemedicine).  Patients are able to view lab/test results, encounter notes, upcoming appointments, etc.  Non-urgent messages can be sent to your provider as well.   To learn more about what you can do with MyChart, go to [email or web address].    Your next appointment:   1 year(s)  Provider:   Donato Schultz, MD     Other Instructions  Your physician wants you to follow-up in: 1 year.  You will receive a reminder letter in the mail two months in advance. If you don't receive a letter, please call our office to schedule the follow-up appointment.    1st Floor: - Lobby - Registration  - Pharmacy  -  Lab - Cafe  2nd Floor: - PV Lab - Diagnostic Testing (echo, CT, nuclear med)  3rd Floor: - Vacant  4th Floor: - TCTS (cardiothoracic surgery) - AFib Clinic - Structural Heart Clinic - Vascular Surgery  - Vascular Ultrasound  5th Floor: - HeartCare Cardiology (general and EP) - Clinical Pharmacy for coumadin, hypertension, lipid, weight-loss medications, and med management appointments    Valet parking services will be available as well.

## 2023-08-26 LAB — COMPREHENSIVE METABOLIC PANEL
ALT: 21 IU/L (ref 0–32)
AST: 22 IU/L (ref 0–40)
Albumin: 4.1 g/dL (ref 3.8–4.8)
Alkaline Phosphatase: 120 IU/L (ref 44–121)
BUN/Creatinine Ratio: 19 (ref 12–28)
BUN: 15 mg/dL (ref 8–27)
Bilirubin Total: 0.2 mg/dL (ref 0.0–1.2)
CO2: 25 mmol/L (ref 20–29)
Calcium: 9 mg/dL (ref 8.7–10.3)
Chloride: 104 mmol/L (ref 96–106)
Creatinine, Ser: 0.78 mg/dL (ref 0.57–1.00)
Globulin, Total: 2.3 g/dL (ref 1.5–4.5)
Glucose: 76 mg/dL (ref 70–99)
Potassium: 4.5 mmol/L (ref 3.5–5.2)
Sodium: 140 mmol/L (ref 134–144)
Total Protein: 6.4 g/dL (ref 6.0–8.5)
eGFR: 81 mL/min/{1.73_m2} (ref >59)

## 2023-08-26 LAB — LIPID PANEL
Chol/HDL Ratio: 2.5 ratio (ref 0.0–4.4)
Cholesterol, Total: 140 mg/dL (ref 100–199)
HDL: 55 mg/dL (ref >39)
LDL Chol Calc (NIH): 61 mg/dL (ref 0–99)
Triglycerides: 137 mg/dL (ref 0–149)
VLDL Cholesterol Cal: 24 mg/dL (ref 5–40)

## 2023-08-26 LAB — LIPOPROTEIN A (LPA)

## 2023-09-30 DIAGNOSIS — K080 Exfoliation of teeth due to systemic causes: Secondary | ICD-10-CM | POA: Diagnosis not present

## 2023-10-01 ENCOUNTER — Encounter: Payer: Self-pay | Admitting: Gastroenterology

## 2023-10-02 DIAGNOSIS — R1013 Epigastric pain: Secondary | ICD-10-CM | POA: Diagnosis not present

## 2023-10-02 DIAGNOSIS — Z79899 Other long term (current) drug therapy: Secondary | ICD-10-CM | POA: Diagnosis not present

## 2023-10-02 DIAGNOSIS — K219 Gastro-esophageal reflux disease without esophagitis: Secondary | ICD-10-CM | POA: Diagnosis not present

## 2023-10-02 DIAGNOSIS — R5383 Other fatigue: Secondary | ICD-10-CM | POA: Diagnosis not present

## 2023-10-15 DIAGNOSIS — L089 Local infection of the skin and subcutaneous tissue, unspecified: Secondary | ICD-10-CM | POA: Diagnosis not present

## 2023-10-15 DIAGNOSIS — L578 Other skin changes due to chronic exposure to nonionizing radiation: Secondary | ICD-10-CM | POA: Diagnosis not present

## 2023-10-15 DIAGNOSIS — D225 Melanocytic nevi of trunk: Secondary | ICD-10-CM | POA: Diagnosis not present

## 2023-10-15 DIAGNOSIS — D2372 Other benign neoplasm of skin of left lower limb, including hip: Secondary | ICD-10-CM | POA: Diagnosis not present

## 2023-10-27 ENCOUNTER — Telehealth: Payer: Self-pay | Admitting: Psychiatry

## 2023-10-27 NOTE — Telephone Encounter (Signed)
 Pt called and needs a 10 day supply of her seroquel  25 mg. She is waiting for mail order so please send to the [facility] on [address] in [place]

## 2023-10-31 ENCOUNTER — Ambulatory Visit: Admitting: Gastroenterology

## 2023-10-31 ENCOUNTER — Encounter: Payer: Self-pay | Admitting: Gastroenterology

## 2023-10-31 VITALS — BP 110/62 | HR 60 | Ht 62.0 in | Wt 131.0 lb

## 2023-10-31 DIAGNOSIS — K219 Gastro-esophageal reflux disease without esophagitis: Secondary | ICD-10-CM

## 2023-10-31 DIAGNOSIS — K581 Irritable bowel syndrome with constipation: Secondary | ICD-10-CM

## 2023-10-31 DIAGNOSIS — K5909 Other constipation: Secondary | ICD-10-CM | POA: Diagnosis not present

## 2023-10-31 MED ORDER — LINACLOTIDE 72 MCG PO CAPS: 72.0000 ug | ORAL_CAPSULE | Freq: Every day | ORAL | 3 refills | Status: DC

## 2023-10-31 NOTE — Progress Notes (Signed)
 [name]    [id number]    March 02, 1951  Primary Care Physician:Miller, [place]  E, PA  Referring Physician: Ellouise Haagensen, PA [address] [place],  [place] [address]   Chief complaint:  constipation, abd pain   History of Present Illness [name] is a 73 year old female with chronic constipation who presents with difficulty in bowel movements.  She has chronic constipation and difficulty with bowel movements, which has been ongoing for an extended period. Bowel movements are infrequent and difficult, often requiring manual assistance to evacuate. She only has bowel movements when taking Linzess , a medication she uses twice a week. Without Linzess , she does not have bowel movements. She has tried different doses of Linzess , including 145 mcg and 290 mcg, but experiences significant side effects such as exhaustion and discomfort. A lower dose of 72 mcg was not effective when taken alone.  She feels 'starving all the time' and has experienced weight fluctuations, having lost and regained five pounds. She wants to eat to feel better and finds her symptoms frustrating and impactful on her daily life. Her social history includes being very active and busy, which she feels is impacted by her bowel issues. She desires to maintain her active lifestyle and travel, but finds it challenging due to her symptoms.  She has a history of diarrhea as a child, which she believes may have contributed to her current issues. She has previously undergone pelvic floor physical therapy for bladder issues and has attempted breathing exercises to aid in bowel movements.  She experiences nausea, bloating, and fatigue, particularly after taking Linzess . There is a sensation of being backed up and abdominal discomfort. Dietary changes, including eating prunes, cause cramping without effective relief. No bowel movements without medication.   EGD 10/07/22 - LA Grade A reflux  esophagitis with no bleeding. - 2 cm hiatal hernia. - Gastritis. Biopsied. - Normal examined duodenum.   Outpatient Encounter Medications as of 10/31/2023  Medication Sig   acyclovir  (ZOVIRAX ) 400 MG tablet Take 400 mg by mouth as needed.   ARTIFICIAL TEAR SOLUTION OP Place 1 drop into both eyes 2 (two) times daily.   Aspirin  81 MG CAPS Take 81 mg by mouth daily.   atorvastatin  (LIPITOR ) 40 MG tablet Take 1 tablet (40 mg total) by mouth daily.   Bacillus Coagulans-Inulin (PROBIOTIC-PREBIOTIC PO) Take 1 capsule by mouth in the morning and at bedtime.   Cholecalciferol  (VITAMIN D ) 50 MCG (2000 UT) tablet Take 2,000 Units by mouth daily.   cyclobenzaprine (FLEXERIL) 10 MG tablet Take 10 mg by mouth daily as needed for muscle spasms.   dicyclomine  (BENTYL ) 10 MG capsule TAKE 1 CAPSULE TWICE DAILY FOR SPASM(S) AS NEEDED   Digestive Enzymes (MULTI-ENZYME) TABS Take 1 tablet by mouth daily as needed (acidic food/beans/broccoli).   diltiazem  2 % GEL Apply 1 Application topically as needed. For rectal spasm   ezetimibe  (ZETIA ) 10 MG tablet Take 1 tablet (10 mg total) by mouth daily.   fluticasone (FLONASE) 50 MCG/ACT nasal spray 1 spray as needed.   hydrALAZINE  (APRESOLINE ) 25 MG tablet Take one (1) tablet by mouth ( 25 mg) up to three times daily for systolic BP > 160.   hyoscyamine  (LEVSIN SL) 0.125 MG SL tablet DISSOLVE 1 TABLET UNDER THE TONGUE EVERY DAY AS NEEDED   linaclotide  (LINZESS ) 72 MCG capsule Take 1 capsule (72 mcg total) by mouth daily before breakfast. (Patient taking  differently: Take 72 mcg by mouth as needed (constipation).)   nebivolol  (BYSTOLIC ) 2.5 MG tablet TAKE 1 TABLET DAILY   NEXIUM  40 MG capsule BRAND NAME ONLY- take 1 capsule daily as directed   nitroGLYCERIN  (NITROSTAT ) 0.4 MG SL tablet place 1 tablet under the tongue if needed every 5 minutes for chest pain for 3 doses IF NO RELIEF AFTER 3RD DOSE CALL PRESCRIBER OR 911.   oxyCODONE  (OXY IR/ROXICODONE ) 5 MG immediate release  tablet Take 5 mg by mouth every 4 (four) hours as needed.   Peppermint Oil 90 MG CPCR Take 2 capsules by mouth as needed (indigestion).   QUEtiapine  (SEROQUEL ) 25 MG tablet Take 1 tablet (25 mg total) by mouth at bedtime.   ranolazine  (RANEXA ) 500 MG 12 hr tablet TAKE 1 TABLET TWICE A DAY   traMADol  (ULTRAM ) 50 MG tablet Take 50 mg by mouth as needed.   No facility-administered encounter medications on file as of 10/31/2023.    Allergies as of 10/31/2023 - Review Complete 10/31/2023  Allergen Reaction Noted   Clindamycin/lincomycin Other (See Comments) 10/05/2011   Doxycycline Nausea And Vomiting 10/05/2011   Escitalopram oxalate Nausea Only 10/05/2011   Levsinex timecaps [hyoscyamine ] Itching    Macrodantin Other (See Comments) 10/05/2011   Sulfa antibiotics  07/20/2020   Trazodone and nefazodone Nausea Only 10/05/2011   Trimethoprim Other (See Comments) 12/09/2013   Epinephrine  Palpitations 08/23/2019   Lincomycin Rash 10/05/2011   Nitrofurantoin macrocrystal Anxiety and Rash     Past Medical History:  Diagnosis Date   Anxiety    Psychiatry Dr. Almeta Jacobus, concerns for memory loss, anxiety   Arthritis    Broken heart syndrome    Cancer (HCC)    bladder CA   Cataract    Coronary atherosclerosis CARDIOLOGIST-  DR Kay Parson   Cath 11/2006 demonstrating moderate obstructive coronary disease with 70-80% septal perforator #1, 50-70% first diagonal, 80% third diagonal, 50% mid and distal LAD & heavy calcification throughout all 3 coronaries. LVF normal.   Essential hypertension, benign    Fatty liver 04/28/2012   GERD (gastroesophageal reflux disease)    Heart murmur    History of esophageal spasm    takes ranexa    History of TIA (transient ischemic attack)    2011--  no residuals   Hyperlipidemia    IBS (irritable bowel syndrome)    Diarrhea predominent, esophageal spasm severe, GERD - Dr. Claudio Culver   Insomnia    Interstitial cystitis    Dr. Isla Mari   Memory loss     Psychiatry Dr. Almeta Jacobus, concerns for memory loss, anxiety   PONV (postoperative nausea and vomiting)    AND URINARY RETENTION   TMJ (temporomandibular joint syndrome)    Wears glasses     Past Surgical History:  Procedure Laterality Date   ABDOMINAL HYSTERECTOMY  AGE 23 -- 1989   APPENDECTOMY  1971   AND OVARIAN CYSTECTOMY   BENIGN EXCISIONAL BREAST BX  1996   BREAST EXCISIONAL BIOPSY Right 1992   CARDIAC CATHETERIZATION  11-28-2006   DR Ace Abu SMITH    moderate cad/  70-80% septal perforator #1, 50-70% first diagonal, 80% third diagonal, 50% mid and distal LAD & heavy calcification throughout all 3 coronaries. LVF normal.   CARDIAC CATHETERIZATION  03-26-2012   DR Kay Parson   patent CFX,  RCA,  & pLAD/  mLAD >50% to <70% a focal eccentric region that overlaps the third diagonal/  90% diagonal ostial and unchanged from prior study /  ef 65%   CARPAL TUNNEL RELEASE Bilateral    CYSTO WITH HYDRODISTENSION N/A 01/14/2014   Procedure: CYSTOSCOPY/HYDRODISTENSION/INSERTION OF MARCAINE  AND PYRIDUIM INTO BLADDER/INJECTION OF MARCAINE  AND KENALOG  into sub trigone space ;  Surgeon: Edmund Gouge, MD;  Location[facility];  Service: Urology;  Laterality: N/A;   CYSTO/  HYDRODISTENTION/  INSTILLATION THERAPY  03/12/2010   ESOPHAGOGASTRODUODENOSCOPY  04/30/2012   Procedure: ESOPHAGOGASTRODUODENOSCOPY (EGD);  Surgeon: Pietro Bridegroom, MD;  Location: [facility];  Service: Endoscopy;  Laterality: N/A;   EYE SURGERY Bilateral 2022   KNEE ARTHROSCOPY WITH MEDIAL MENISECTOMY Left 07/12/2021   Procedure: KNEE ARTHROSCOPY WITH PARTIAL MEDIAL MENISECTOMY, CHONDROPLASTY;  Surgeon: Claiborne Crew, MD;  Location: [facility];  Service: Orthopedics;  Laterality: Left;   LEFT HEART CATH AND CORONARY ANGIOGRAPHY N/A 02/18/2017   Procedure: LEFT HEART CATH AND CORONARY ANGIOGRAPHY;  Surgeon: Arty Binning, MD;  Location: [facility];  Service: Cardiovascular;  Laterality: N/A;    LUMBAR DISC SURGERY  1973   SHOULDER ARTHROSCOPY Left 12/23/2011   SHOULDER SURGERY Right 2010   TRANSTHORACIC ECHOCARDIOGRAM  04/21/2012   GRADE I DIASTOLIC DYSFUNCTION/  EF 60-65%/  MILD MR  &  TR   TRANSURETHRAL RESECTION OF BLADDER TUMOR Right 01/03/2015   Procedure: TRANSURETHRAL RESECTION OF BLADDER TUMOR (TURBT);  Surgeon: Annamarie Kid, MD;  Location: [facility];  Service: Urology;  Laterality: Right;   TRIGGER FINGER RELEASE Bilateral 2017    Family History  Problem Relation Age of Onset   Heart disease Mother    Colon polyps Mother    Alzheimer's disease Mother    Dementia Mother    CAD Mother    Heart disease Father    Diverticulosis Father    CAD Father        in his 33s   Heart disease Brother    Osteoarthritis Other        Multiple family members   Obesity Other        Multiple family members   Depression Other        Multiple family members   Breast cancer Sister 31       Pam   Colon cancer Neg Hx    Esophageal cancer Neg Hx    Stomach cancer Neg Hx    Rectal cancer Neg Hx     Social History   Socioeconomic History   Marital status: Married    Spouse name: Not on file   Number of children: 2   Years of education: Not on file   Highest education level: Not on file  Occupational History   Occupation: caregiver  Tobacco Use   Smoking status: Former    Current packs/day: 0.00    Average packs/day: 1 pack/day for 10.0 years (10.0 ttl pk-yrs)    Types: Cigarettes    Start date: 06/11/1963    Quit date: 06/10/1973    Years since quitting: 50.4   Smokeless tobacco: Never  Vaping Use   Vaping status: Never Used  Substance and Sexual Activity   Alcohol use: Yes    Alcohol/week: 2.0 standard drinks of alcohol    Types: 2 Glasses of wine per week    Comment: social   Drug use: No   Sexual activity: Yes  Other Topics Concern   Not on file  Social History Narrative   Are you right handed or left handed? Right   Are you currently  employed ? N    What is  your current occupation?   Do you live at home alone? husband   Who lives with you?    What type of home do you live in: 1 story or 2 story? 1       Social Drivers of Corporate investment banker Strain: Not on file  Food Insecurity: Not on file  Transportation Needs: Not on file  Physical Activity: Not on file  Stress: Not on file  Social Connections: Not on file  Intimate Partner Violence: Not on file      Review of systems: All other review of systems negative except as mentioned in the HPI.   Physical Exam: Vitals:   10/31/23 0947  BP: 110/62  Pulse: 60   Body mass index is 23.96 kg/m. Gen:      No acute distress HEENT:  sclera anicteric Abd:      soft, non-tender; no palpable masses, no distension Ext:    No edema Neuro: alert and oriented x 3 Psych: normal mood and affect  Data Reviewed:  Reviewed labs, radiology imaging, old records and pertinent past GI work up     Assessment and Plan Assessment & Plan Chronic constipation Chronic constipation with difficulty in bowel movements, requiring Linzess  for relief. Symptoms include pain, nausea, discomfort, bloating, and fatigue. Linzess  at 145 mcg causes exhaustion and discomfort. Previous trials of different doses, including 290 mcg and 72 mcg, with limited success. Constipation likely due to functional bowel issues, possibly related to pelvic floor dysfunction. She experiences difficulty with evacuation, influenced by physical activity and sweating. Concerns about incomplete evacuation and potential complications from constipation. Discussed the role of diet, bowel function, and pelvic floor in constipation. Emphasized the importance of consistent bowel movements and the potential benefits of physical therapy for pelvic floor dysfunction. Discussed the risks of carrying waste for extended periods and the need for retraining bowel function. Encouraged to try a lower dose of Linzess  (72 mcg)  daily, with adjustments based on response. Discussed the potential benefits of taking Linzess  on an empty stomach versus after a meal. Consideration of holistic approaches and nutritionist consultation, though insurance may not cover it. - Refer to pelvic floor physical therapy at [facility] Or. - Prescribe Linzess  72 mcg daily, adjust dose based on response. - Provide samples of Linzess  72 mcg. - Instruct to take Linzess  on an empty stomach, or after a meal if not effective. - Encourage diaphragmatic breathing exercises. - Discuss potential consultation with a holistic practitioner or nutritionist.  GERD: Continue antireflux measures and current regimen, stable symptoms   The patient was provided an opportunity to ask questions and all were answered. The patient agreed with the plan and demonstrated an understanding of the instructions.  Lorena Rolling , MD    CC: Annabell Key, [place]  E, PA

## 2023-10-31 NOTE — Patient Instructions (Addendum)
 VISIT SUMMARY:  Today, we discussed your ongoing issues with chronic constipation and difficulty with bowel movements. We reviewed your symptoms, including pain, nausea, bloating, and fatigue, and your experiences with different doses of Linzess . We also talked about the impact of these symptoms on your daily life and activities.  YOUR PLAN:  -CHRONIC CONSTIPATION: Chronic constipation means having infrequent or difficult bowel movements over a long period. We discussed that your constipation might be related to functional bowel issues and possibly pelvic floor dysfunction. You should start taking Linzess  72 mcg daily and adjust the dose based on your response. Take Linzess  on an empty stomach, or after a meal if it is not effective. We also talked about the importance of consistent bowel movements and the potential benefits of physical therapy for pelvic floor dysfunction. You are encouraged to try diaphragmatic breathing exercises.  INSTRUCTIONS:  Please follow up with pelvic floor physical therapy. Start taking Linzess  72 mcg daily and adjust the dose based on your response. Take Linzess  on an empty stomach, or after a meal if it is not effective enough. Continue with diaphragmatic breathing exercises.   Due to recent changes in healthcare laws, you may see the results of your imaging and laboratory studies on MyChart before your provider has had a chance to review them.  We understand that in some cases there may be results that are confusing or concerning to you. Not all laboratory results come back in the same time frame and the provider may be waiting for multiple results in order to interpret others.  Please give us  48 hours in order for your provider to thoroughly review all the results before contacting the office for clarification of your results.    I appreciate the  opportunity to care for you  Thank You   Kavitha Nandigam , MD

## 2023-11-04 ENCOUNTER — Encounter: Payer: Self-pay | Admitting: Gastroenterology

## 2023-11-04 ENCOUNTER — Inpatient Hospital Stay: Attending: Hematology and Oncology | Admitting: Hematology and Oncology

## 2023-11-04 ENCOUNTER — Inpatient Hospital Stay

## 2023-11-04 ENCOUNTER — Encounter: Payer: Self-pay | Admitting: Hematology and Oncology

## 2023-11-04 VITALS — BP 118/64 | HR 65 | Temp 98.5°F | Resp 18 | Ht 62.0 in | Wt 132.2 lb

## 2023-11-04 DIAGNOSIS — M1990 Unspecified osteoarthritis, unspecified site: Secondary | ICD-10-CM | POA: Insufficient documentation

## 2023-11-04 DIAGNOSIS — Z8249 Family history of ischemic heart disease and other diseases of the circulatory system: Secondary | ICD-10-CM | POA: Insufficient documentation

## 2023-11-04 DIAGNOSIS — I10 Essential (primary) hypertension: Secondary | ICD-10-CM | POA: Diagnosis not present

## 2023-11-04 DIAGNOSIS — Z803 Family history of malignant neoplasm of breast: Secondary | ICD-10-CM | POA: Insufficient documentation

## 2023-11-04 DIAGNOSIS — Z818 Family history of other mental and behavioral disorders: Secondary | ICD-10-CM | POA: Diagnosis not present

## 2023-11-04 DIAGNOSIS — Z8379 Family history of other diseases of the digestive system: Secondary | ICD-10-CM | POA: Insufficient documentation

## 2023-11-04 DIAGNOSIS — I25119 Atherosclerotic heart disease of native coronary artery with unspecified angina pectoris: Secondary | ICD-10-CM | POA: Diagnosis not present

## 2023-11-04 DIAGNOSIS — Z881 Allergy status to other antibiotic agents status: Secondary | ICD-10-CM | POA: Insufficient documentation

## 2023-11-04 DIAGNOSIS — E785 Hyperlipidemia, unspecified: Secondary | ICD-10-CM | POA: Insufficient documentation

## 2023-11-04 DIAGNOSIS — Z90710 Acquired absence of both cervix and uterus: Secondary | ICD-10-CM | POA: Insufficient documentation

## 2023-11-04 DIAGNOSIS — Z8551 Personal history of malignant neoplasm of bladder: Secondary | ICD-10-CM | POA: Insufficient documentation

## 2023-11-04 DIAGNOSIS — Z9049 Acquired absence of other specified parts of digestive tract: Secondary | ICD-10-CM | POA: Insufficient documentation

## 2023-11-04 DIAGNOSIS — Z79899 Other long term (current) drug therapy: Secondary | ICD-10-CM | POA: Diagnosis not present

## 2023-11-04 DIAGNOSIS — Z7982 Long term (current) use of aspirin: Secondary | ICD-10-CM | POA: Insufficient documentation

## 2023-11-04 DIAGNOSIS — G2581 Restless legs syndrome: Secondary | ICD-10-CM | POA: Diagnosis not present

## 2023-11-04 DIAGNOSIS — Z8261 Family history of arthritis: Secondary | ICD-10-CM | POA: Diagnosis not present

## 2023-11-04 DIAGNOSIS — Z83719 Family history of colon polyps, unspecified: Secondary | ICD-10-CM | POA: Diagnosis not present

## 2023-11-04 DIAGNOSIS — Z8349 Family history of other endocrine, nutritional and metabolic diseases: Secondary | ICD-10-CM | POA: Diagnosis not present

## 2023-11-04 DIAGNOSIS — Z8673 Personal history of transient ischemic attack (TIA), and cerebral infarction without residual deficits: Secondary | ICD-10-CM | POA: Diagnosis not present

## 2023-11-04 DIAGNOSIS — Z882 Allergy status to sulfonamides status: Secondary | ICD-10-CM | POA: Diagnosis not present

## 2023-11-04 DIAGNOSIS — Z87891 Personal history of nicotine dependence: Secondary | ICD-10-CM | POA: Diagnosis not present

## 2023-11-04 DIAGNOSIS — D500 Iron deficiency anemia secondary to blood loss (chronic): Secondary | ICD-10-CM | POA: Diagnosis not present

## 2023-11-04 NOTE — Assessment & Plan Note (Signed)
 She is currently under active surveillance with urologist from [facility] I will defer to them for management

## 2023-11-04 NOTE — Assessment & Plan Note (Signed)
 The most likely cause of her anemia is due to poor dietary intake of iron rich food. We discussed some of the risks, benefits, and alternatives of intravenous iron infusions. The patient is symptomatic from anemia and the iron level is critically low. She tolerated oral iron supplement poorly and desires to achieved higher levels of iron faster for adequate hematopoesis. Some of the side-effects to be expected including risks of infusion reactions, phlebitis, headaches, nausea and fatigue.  The patient is willing to proceed. Patient education material was dispensed.  Goal is to keep ferritin level greater than 50 and resolution of anemia I recommend 2 doses of intravenous iron Feraheme She will continue blood count monitoring with her primary care doctor once a year and will contact me next year if she needs further IV iron infusion  We discussed importance of dietary modification and she is going to implement food with more iron in the future

## 2023-11-04 NOTE — Progress Notes (Signed)
 [facility] CONSULT NOTE  Patient Care Team: [name], [place]  E, PA as PCP - General (Internal Medicine) [name] Madura, MD as PCP - Cardiology (Cardiology)  ASSESSMENT & PLAN:  Iron deficiency anemia due to chronic blood loss The most likely cause of her anemia is due to poor dietary intake of iron rich food. We discussed some of the risks, benefits, and alternatives of intravenous iron infusions. The patient is symptomatic from anemia and the iron level is critically low. She tolerated oral iron supplement poorly and desires to achieved higher levels of iron faster for adequate hematopoesis. Some of the side-effects to be expected including risks of infusion reactions, phlebitis, headaches, nausea and fatigue.  The patient is willing to proceed. Patient education material was dispensed.  Goal is to keep ferritin level greater than 50 and resolution of anemia I recommend 2 doses of intravenous iron Feraheme She will continue blood count monitoring with her primary care doctor once a year and will contact me next year if she needs further IV iron infusion  We discussed importance of dietary modification and she is going to implement food with more iron in the future   History of bladder cancer She is currently under active surveillance with urologist from [facility] I will defer to them for management No orders of the defined types were placed in this encounter.   All questions were answered. The patient knows to call the clinic with any problems, questions or concerns.  The total time spent in the appointment was 55 minutes encounter with patients including review of chart and various tests results, discussions about plan of care and coordination of care plan  [name] Jacobs, MD 5/27/20251:13 PM   CHIEF COMPLAINTS/PURPOSE OF CONSULTATION:  Anemia  HISTORY OF PRESENTING ILLNESS:  [name] 73 y.o. female is here because of anemia  She was found to have abnormal CBC  from recent blood draw with her primary care doctor's visit On October 02, 2022, CBC showed hemoglobin of 11.8 and ferritin was 6.3 On review of her historical record dated back to 2009, at baseline, on Nov 01, 2007, heart, 6.5, hemoglobin 12.5 and platelet count was 250. On February 07, 2023, white count 7.2, hemoglobin 12.2 and platelet count was 336 Vitamin B12 level was okay She has background history of coronary artery disease and has occasional angina with exertion She denies recent shortness of breath on minimal exertion, pre-syncopal episodes, or palpitations. She complained of excessive fatigue and has restless leg syndrome She had not noticed any recent bleeding such as epistaxis, hematuria or hematochezia  The patient denies over the counter NSAID ingestion. She is on antiplatelets agents. Her last colonoscopy was over a year year ago and according to the patient, it was normal She has remote history of bladder cancer and was treated with BCG installation She had normal visit with her urologist recently She denies any pica and eats a variety of diet.  However, she typically do not have lunch.  Her breakfast consists of cereal with milk.  Dinner is usually combination of a vegetable, chicken and potatoes.  In general, her diet does not have much red meat or meat in general She never donated blood or received blood transfusion The patient was prescribed oral iron supplements and she takes that in the past but tolerated poorly due to symptoms of gastric discomfort and irritable bowel with severe cramping She has 2 prior pregnancies and one of her pregnancy was complicated with postpartum hemorrhage  MEDICAL HISTORY:  Past Medical History:  Diagnosis Date   Anxiety    Psychiatry Dr. Almeta Jacobus, concerns for memory loss, anxiety   Arthritis    Broken heart syndrome    Cancer (HCC)    bladder CA   Cataract    Coronary atherosclerosis CARDIOLOGIST-  DR Kay Parson   Cath 11/2006  demonstrating moderate obstructive coronary disease with 70-80% septal perforator #1, 50-70% first diagonal, 80% third diagonal, 50% mid and distal LAD & heavy calcification throughout all 3 coronaries. LVF normal.   Essential hypertension, benign    Fatty liver 04/28/2012   GERD (gastroesophageal reflux disease)    Heart murmur    History of esophageal spasm    takes ranexa    History of TIA (transient ischemic attack)    2011--  no residuals   Hyperlipidemia    IBS (irritable bowel syndrome)    Diarrhea predominent, esophageal spasm severe, GERD - Dr. Claudio Culver   Insomnia    Interstitial cystitis    Dr. Isla Mari   Memory loss    Psychiatry Dr. Almeta Jacobus, concerns for memory loss, anxiety   PONV (postoperative nausea and vomiting)    AND URINARY RETENTION   TMJ (temporomandibular joint syndrome)    Wears glasses     SURGICAL HISTORY: Past Surgical History:  Procedure Laterality Date   ABDOMINAL HYSTERECTOMY  AGE 36 -- 1989   APPENDECTOMY  1971   AND OVARIAN CYSTECTOMY   BENIGN EXCISIONAL BREAST BX  1996   BREAST EXCISIONAL BIOPSY Right 1992   CARDIAC CATHETERIZATION  11-28-2006   DR Ace Abu SMITH    moderate cad/  70-80% septal perforator #1, 50-70% first diagonal, 80% third diagonal, 50% mid and distal LAD & heavy calcification throughout all 3 coronaries. LVF normal.   CARDIAC CATHETERIZATION  03-26-2012   DR Kay Parson   patent CFX,  RCA,  & pLAD/  mLAD >50% to <70% a focal eccentric region that overlaps the third diagonal/  90% diagonal ostial and unchanged from prior study /   ef 65%   CARPAL TUNNEL RELEASE Bilateral    CYSTO WITH HYDRODISTENSION N/A 01/14/2014   Procedure: CYSTOSCOPY/HYDRODISTENSION/INSERTION OF MARCAINE  AND PYRIDUIM INTO BLADDER/INJECTION OF MARCAINE  AND KENALOG  into sub trigone space ;  Surgeon: Edmund Gouge, MD;  Location: [facility];  Service: Urology;  Laterality: N/A;   CYSTO/  HYDRODISTENTION/  INSTILLATION THERAPY   03/12/2010   ESOPHAGOGASTRODUODENOSCOPY  04/30/2012   Procedure: ESOPHAGOGASTRODUODENOSCOPY (EGD);  Surgeon: Pietro Bridegroom, MD;  Location: [facility];  Service: Endoscopy;  Laterality: N/A;   EYE SURGERY Bilateral 2022   KNEE ARTHROSCOPY WITH MEDIAL MENISECTOMY Left 07/12/2021   Procedure: KNEE ARTHROSCOPY WITH PARTIAL MEDIAL MENISECTOMY, CHONDROPLASTY;  Surgeon: Claiborne Crew, MD;  Location: [facility];  Service: Orthopedics;  Laterality: Left;   LEFT HEART CATH AND CORONARY ANGIOGRAPHY N/A 02/18/2017   Procedure: LEFT HEART CATH AND CORONARY ANGIOGRAPHY;  Surgeon: Arty Binning, MD;  Location: [facility];  Service: Cardiovascular;  Laterality: N/A;   LUMBAR DISC SURGERY  1973   SHOULDER ARTHROSCOPY Left 12/23/2011   SHOULDER SURGERY Right 2010   TRANSTHORACIC ECHOCARDIOGRAM  04/21/2012   GRADE I DIASTOLIC DYSFUNCTION/  EF 60-65%/  MILD MR  &  TR   TRANSURETHRAL RESECTION OF BLADDER TUMOR Right 01/03/2015   Procedure: TRANSURETHRAL RESECTION OF BLADDER TUMOR (TURBT);  Surgeon: Annamarie Kid, MD;  Location[facility];  Service: Urology;  Laterality: Right;   TRIGGER FINGER RELEASE Bilateral 2017  SOCIAL HISTORY: Social History   Socioeconomic History   Marital status: Married    Spouse name: Not on file   Number of children: 2   Years of education: Not on file   Highest education level: Not on file  Occupational History   Occupation: caregiver  Tobacco Use   Smoking status: Former    Current packs/day: 0.00    Average packs/day: 1 pack/day for 10.0 years (10.0 ttl pk-yrs)    Types: Cigarettes    Start date: 06/11/1963    Quit date: 06/10/1973    Years since quitting: 50.4   Smokeless tobacco: Never  Vaping Use   Vaping status: Never Used  Substance and Sexual Activity   Alcohol use: Yes    Alcohol/week: 2.0 standard drinks of alcohol    Types: 2 Glasses of wine per week    Comment: social   Drug use: No   Sexual activity: Yes  Other Topics  Concern   Not on file  Social History Narrative   Are you right handed or left handed? Right   Are you currently employed ? N    What is your current occupation?   Do you live at home alone? husband   Who lives with you?    What type of home do you live in: 1 story or 2 story? 1       Social Drivers of Community education officer: Not on file  Food Insecurity: Not on file  Transportation Needs: Not on file  Physical Activity: Not on file  Stress: Not on file  Social Connections: Not on file  Intimate Partner Violence: Not on file    FAMILY HISTORY: Family History  Problem Relation Age of Onset   Heart disease Mother    Colon polyps Mother    Alzheimer's disease Mother    Dementia Mother    CAD Mother    Heart disease Father    Diverticulosis Father    CAD Father        in his 32s   Heart disease Brother    Osteoarthritis Other        Multiple family members   Obesity Other        Multiple family members   Depression Other        Multiple family members   Breast cancer Sister 14       Pam   Colon cancer Neg Hx    Esophageal cancer Neg Hx    Stomach cancer Neg Hx    Rectal cancer Neg Hx     ALLERGIES:  is allergic to clindamycin/lincomycin, doxycycline, escitalopram oxalate, levsinex timecaps [hyoscyamine ], macrodantin, sulfa antibiotics, trazodone and nefazodone, trimethoprim, zoloft [sertraline], lincomycin, and nitrofurantoin macrocrystal.  MEDICATIONS:  Current Outpatient Medications  Medication Sig Dispense Refill   Lactobacillus (PROBIOTIC ACIDOPHILUS PO) Take 1 capsule by mouth daily as needed.     Multiple Vitamins-Minerals (HAIR SKIN AND NAILS FORMULA) TABS Take 1 capsule by mouth daily.     acyclovir  (ZOVIRAX ) 400 MG tablet Take 400 mg by mouth as needed.     ARTIFICIAL TEAR SOLUTION OP Place 1 drop into both eyes 2 (two) times daily.     Aspirin  81 MG CAPS Take 81 mg by mouth daily.     atorvastatin  (LIPITOR ) 40 MG tablet Take 1 tablet (40 mg  total) by mouth daily. 90 tablet 2   Cholecalciferol  (VITAMIN D ) 50 MCG (2000 UT) tablet Take 2,000 Units by mouth daily. Takes 1,000 mg  daily     cyclobenzaprine (FLEXERIL) 10 MG tablet Take 10 mg by mouth daily as needed for muscle spasms.     dicyclomine  (BENTYL ) 10 MG capsule TAKE 1 CAPSULE TWICE DAILY FOR SPASM(S) AS NEEDED 180 capsule 3   diltiazem  2 % GEL Apply 1 Application topically as needed. For rectal spasm 30 g 1   ezetimibe  (ZETIA ) 10 MG tablet Take 1 tablet (10 mg total) by mouth daily. 90 tablet 3   fluticasone (FLONASE) 50 MCG/ACT nasal spray 1 spray as needed.     hydrALAZINE  (APRESOLINE ) 25 MG tablet Take one (1) tablet by mouth ( 25 mg) up to three times daily for systolic BP > 160. 90 tablet 1   hyoscyamine  (LEVSIN SL) 0.125 MG SL tablet DISSOLVE 1 TABLET UNDER THE TONGUE EVERY DAY AS NEEDED 90 tablet 3   linaclotide  (LINZESS ) 72 MCG capsule Take 1 capsule (72 mcg total) by mouth daily before breakfast. 90 capsule 3   nebivolol  (BYSTOLIC ) 2.5 MG tablet TAKE 1 TABLET DAILY 90 tablet 1   NEXIUM  40 MG capsule BRAND NAME ONLY- take 1 capsule daily as directed 90 capsule 3   nitroGLYCERIN  (NITROSTAT ) 0.4 MG SL tablet place 1 tablet under the tongue if needed every 5 minutes for chest pain for 3 doses IF NO RELIEF AFTER 3RD DOSE CALL PRESCRIBER OR 911. 25 tablet 3   oxyCODONE  (OXY IR/ROXICODONE ) 5 MG immediate release tablet Take 5 mg by mouth every 4 (four) hours as needed.     Peppermint Oil 90 MG CPCR Take 2 capsules by mouth as needed (indigestion).     QUEtiapine  (SEROQUEL ) 25 MG tablet Take 1 tablet (25 mg total) by mouth at bedtime. 30 tablet 0   ranolazine  (RANEXA ) 500 MG 12 hr tablet TAKE 1 TABLET TWICE A DAY 180 tablet 2   traMADol  (ULTRAM ) 50 MG tablet Take 50 mg by mouth as needed.     No current facility-administered medications for this visit.    REVIEW OF SYSTEMS:   Constitutional: Denies fevers, chills or abnormal night sweats Eyes: Denies blurriness of vision,  double vision or watery eyes Ears, nose, mouth, throat, and face: Denies mucositis or sore throat Respiratory: Denies cough, dyspnea or wheezes Cardiovascular: Denies palpitation, chest discomfort or lower extremity swelling Gastrointestinal:  Denies nausea, heartburn or change in bowel habits Skin: Denies abnormal skin rashes Lymphatics: Denies new lymphadenopathy or easy bruising Neurological:Denies numbness, tingling or new weaknesses Behavioral/Psych: Mood is stable, no new changes  All other systems were reviewed with the patient and are negative.  PHYSICAL EXAMINATION: ECOG PERFORMANCE STATUS: 1 - Symptomatic but completely ambulatory  Vitals:   11/04/23 1151  BP: 118/64  Pulse: 65  Resp: 18  Temp: 98.5 F (36.9 C)  SpO2: 100%   Filed Weights   11/04/23 1151  Weight: 132 lb 3.2 oz (60 kg)    GENERAL:alert, no distress and comfortable SKIN: skin color, texture, turgor are normal, no rashes or significant lesions EYES: normal, conjunctiva are pink and non-injected, sclera clear OROPHARYNX:no exudate, no erythema and lips, buccal mucosa, and tongue normal  NECK: supple, thyroid  normal size, non-tender, without nodularity LYMPH:  no palpable lymphadenopathy in the cervical, axillary or inguinal LUNGS: clear to auscultation and percussion with normal breathing effort HEART: regular rate & rhythm and no murmurs and no lower extremity edema ABDOMEN:abdomen soft, non-tender and normal bowel sounds Musculoskeletal:no cyanosis of digits and no clubbing  PSYCH: alert & oriented x 3 with fluent speech NEURO: no focal  motor/sensory deficits

## 2023-11-05 ENCOUNTER — Telehealth: Payer: Self-pay

## 2023-11-05 NOTE — Telephone Encounter (Signed)
 Dr. Marton Sleeper, patient will be scheduled as soon as possible.  Auth Submission: NO AUTH NEEDED Site of care: Site of care: [facility] Payer: [employer]BS medicare Medication & CPT/J Code(s) submitted: Feraheme (ferumoxytol) [id number] Route of submission (phone, fax, portal): phone Phone # [phone] Fax # Auth type: Buy/Bill PB Units/visits requested: 510mg  x 2 doses Reference number: [id number] Approval from: 11/05/23 to 03/07/24

## 2023-11-10 DIAGNOSIS — M5441 Lumbago with sciatica, right side: Secondary | ICD-10-CM | POA: Diagnosis not present

## 2023-11-10 DIAGNOSIS — R399 Unspecified symptoms and signs involving the genitourinary system: Secondary | ICD-10-CM | POA: Diagnosis not present

## 2023-11-11 DIAGNOSIS — M7918 Myalgia, other site: Secondary | ICD-10-CM | POA: Diagnosis not present

## 2023-11-12 ENCOUNTER — Encounter: Payer: Self-pay | Admitting: Gastroenterology

## 2023-11-12 ENCOUNTER — Telehealth: Payer: Self-pay | Admitting: Psychiatry

## 2023-11-12 DIAGNOSIS — F5101 Primary insomnia: Secondary | ICD-10-CM

## 2023-11-12 MED ORDER — QUETIAPINE FUMARATE 25 MG PO TABS: 25.0000 mg | ORAL_TABLET | Freq: Every day | ORAL | 0 refills | Status: DC

## 2023-11-12 NOTE — Telephone Encounter (Signed)
 Pt called and said that her 10 day supply of seroquel  never got sent to the [facility] on [address] in [place]. Please send it in. She is waiting on mail order

## 2023-11-12 NOTE — Telephone Encounter (Signed)
 RF for Seroquel  sent. Previous message was not routed to clinical.

## 2023-12-16 ENCOUNTER — Ambulatory Visit: Admitting: *Deleted

## 2023-12-16 VITALS — BP 150/62 | HR 58 | Temp 97.0°F | Resp 16 | Ht 62.5 in | Wt 132.8 lb

## 2023-12-16 DIAGNOSIS — D509 Iron deficiency anemia, unspecified: Secondary | ICD-10-CM | POA: Diagnosis not present

## 2023-12-16 DIAGNOSIS — D500 Iron deficiency anemia secondary to blood loss (chronic): Secondary | ICD-10-CM

## 2023-12-16 MED ORDER — SODIUM CHLORIDE 0.9 % IV SOLN
510.0000 mg | Freq: Once | INTRAVENOUS | Status: AC
  Administered 2023-12-16: 510 mg via INTRAVENOUS
  Filled 2023-12-16: qty 17

## 2023-12-16 NOTE — Progress Notes (Signed)
 Diagnosis: Iron Deficiency Anemia  Provider:  Mannam, Praveen MD  Procedure: IV Infusion  IV Type: Peripheral, IV Location: R Antecubital  Feraheme (Ferumoxytol ), Dose: 510 mg  Infusion Start Time: 1026 am  Infusion Stop Time: 1050 am  Post Infusion IV Care: Observation period completed and Peripheral IV Discontinued  Discharge: Condition: Good, Destination: Home . AVS Provided  Performed by:  Trudy Lamarr LABOR, RN

## 2023-12-16 NOTE — Patient Instructions (Signed)

## 2023-12-23 ENCOUNTER — Ambulatory Visit

## 2023-12-23 VITALS — BP 150/71 | HR 63 | Temp 98.7°F | Resp 20 | Ht 62.5 in | Wt 131.6 lb

## 2023-12-23 DIAGNOSIS — D509 Iron deficiency anemia, unspecified: Secondary | ICD-10-CM | POA: Diagnosis not present

## 2023-12-23 DIAGNOSIS — D500 Iron deficiency anemia secondary to blood loss (chronic): Secondary | ICD-10-CM

## 2023-12-23 MED ORDER — SODIUM CHLORIDE 0.9 % IV SOLN
510.0000 mg | Freq: Once | INTRAVENOUS | Status: AC
  Administered 2023-12-23: 510 mg via INTRAVENOUS
  Filled 2023-12-23: qty 17

## 2023-12-23 NOTE — Progress Notes (Signed)
 Diagnosis: Iron Deficiency Anemia  Provider:  Praveen Mannam MD  Procedure: IV Infusion  IV Type: Peripheral, IV Location: L Antecubital  Feraheme (Ferumoxytol ), Dose: 510 mg  Infusion Start Time: 1036  Infusion Stop Time: 1054  Post Infusion IV Care: Observation period completed and Peripheral IV Discontinued  Discharge: Condition: Good, Destination: Home . AVS Declined  Performed by:  Maximiano JONELLE Pouch, LPN

## 2023-12-26 DIAGNOSIS — M47816 Spondylosis without myelopathy or radiculopathy, lumbar region: Secondary | ICD-10-CM | POA: Diagnosis not present

## 2023-12-26 DIAGNOSIS — M67951 Unspecified disorder of synovium and tendon, right thigh: Secondary | ICD-10-CM | POA: Diagnosis not present

## 2023-12-26 DIAGNOSIS — M48061 Spinal stenosis, lumbar region without neurogenic claudication: Secondary | ICD-10-CM | POA: Diagnosis not present

## 2023-12-26 DIAGNOSIS — M51362 Other intervertebral disc degeneration, lumbar region with discogenic back pain and lower extremity pain: Secondary | ICD-10-CM | POA: Diagnosis not present

## 2023-12-26 DIAGNOSIS — M4316 Spondylolisthesis, lumbar region: Secondary | ICD-10-CM | POA: Diagnosis not present

## 2023-12-26 DIAGNOSIS — M24151 Other articular cartilage disorders, right hip: Secondary | ICD-10-CM | POA: Diagnosis not present

## 2023-12-26 DIAGNOSIS — S76311A Strain of muscle, fascia and tendon of the posterior muscle group at thigh level, right thigh, initial encounter: Secondary | ICD-10-CM | POA: Diagnosis not present

## 2023-12-26 DIAGNOSIS — S73191A Other sprain of right hip, initial encounter: Secondary | ICD-10-CM | POA: Diagnosis not present

## 2024-01-04 DIAGNOSIS — M00151 Pneumococcal arthritis, right hip: Secondary | ICD-10-CM | POA: Diagnosis not present

## 2024-01-04 DIAGNOSIS — M961 Postlaminectomy syndrome, not elsewhere classified: Secondary | ICD-10-CM | POA: Diagnosis not present

## 2024-01-04 DIAGNOSIS — M47819 Spondylosis without myelopathy or radiculopathy, site unspecified: Secondary | ICD-10-CM | POA: Diagnosis not present

## 2024-01-07 DIAGNOSIS — R9389 Abnormal findings on diagnostic imaging of other specified body structures: Secondary | ICD-10-CM | POA: Diagnosis not present

## 2024-01-07 DIAGNOSIS — M899 Disorder of bone, unspecified: Secondary | ICD-10-CM | POA: Diagnosis not present

## 2024-01-15 ENCOUNTER — Telehealth: Payer: Self-pay

## 2024-01-15 NOTE — Telephone Encounter (Signed)
 Clinic received recent lab results and imaging from [facility]. Patient called to schedule follow-up visit with Dr. Lonn to review results. Patient scheduled for Thursday, August 21st at 10:20 AM. Patient confirmed appointment date and time. Additional myChart message sent to patient with appointment reminder at there request.

## 2024-01-16 DIAGNOSIS — M7061 Trochanteric bursitis, right hip: Secondary | ICD-10-CM | POA: Diagnosis not present

## 2024-01-20 ENCOUNTER — Encounter: Payer: Self-pay | Admitting: Internal Medicine

## 2024-01-27 ENCOUNTER — Telehealth: Payer: Self-pay | Admitting: Psychiatry

## 2024-01-27 ENCOUNTER — Encounter: Payer: Self-pay | Admitting: Gastroenterology

## 2024-01-27 DIAGNOSIS — F5101 Primary insomnia: Secondary | ICD-10-CM

## 2024-01-27 MED ORDER — QUETIAPINE FUMARATE 25 MG PO TABS: 25.0000 mg | ORAL_TABLET | Freq: Every day | ORAL | 0 refills | Status: DC

## 2024-01-27 NOTE — Telephone Encounter (Signed)
 Patient called in for refill on Seroquel  25mg . States she is down to 3 pills and needs refills sent to [facility] [address] [place], [place] appt 10/30 Ph: [phone]

## 2024-01-27 NOTE — Telephone Encounter (Signed)
 Sent!

## 2024-01-29 ENCOUNTER — Inpatient Hospital Stay: Admitting: Hematology and Oncology

## 2024-02-02 DIAGNOSIS — M62838 Other muscle spasm: Secondary | ICD-10-CM | POA: Diagnosis not present

## 2024-02-02 DIAGNOSIS — M6281 Muscle weakness (generalized): Secondary | ICD-10-CM | POA: Diagnosis not present

## 2024-02-02 DIAGNOSIS — N3941 Urge incontinence: Secondary | ICD-10-CM | POA: Diagnosis not present

## 2024-02-02 DIAGNOSIS — K5909 Other constipation: Secondary | ICD-10-CM | POA: Diagnosis not present

## 2024-02-03 DIAGNOSIS — M62838 Other muscle spasm: Secondary | ICD-10-CM | POA: Diagnosis not present

## 2024-02-03 DIAGNOSIS — M6281 Muscle weakness (generalized): Secondary | ICD-10-CM | POA: Diagnosis not present

## 2024-02-03 DIAGNOSIS — N3941 Urge incontinence: Secondary | ICD-10-CM | POA: Diagnosis not present

## 2024-02-03 DIAGNOSIS — K5909 Other constipation: Secondary | ICD-10-CM | POA: Diagnosis not present

## 2024-02-05 DIAGNOSIS — H43813 Vitreous degeneration, bilateral: Secondary | ICD-10-CM | POA: Diagnosis not present

## 2024-02-05 DIAGNOSIS — H18513 Endothelial corneal dystrophy, bilateral: Secondary | ICD-10-CM | POA: Diagnosis not present

## 2024-02-05 DIAGNOSIS — H524 Presbyopia: Secondary | ICD-10-CM | POA: Diagnosis not present

## 2024-02-06 ENCOUNTER — Encounter: Payer: Self-pay | Admitting: Hematology and Oncology

## 2024-02-06 ENCOUNTER — Inpatient Hospital Stay: Attending: Hematology and Oncology | Admitting: Hematology and Oncology

## 2024-02-06 VITALS — BP 127/75 | HR 62 | Temp 98.2°F | Resp 18 | Ht 62.5 in | Wt 133.6 lb

## 2024-02-06 DIAGNOSIS — M25551 Pain in right hip: Secondary | ICD-10-CM | POA: Insufficient documentation

## 2024-02-06 DIAGNOSIS — D500 Iron deficiency anemia secondary to blood loss (chronic): Secondary | ICD-10-CM | POA: Insufficient documentation

## 2024-02-06 DIAGNOSIS — M479 Spondylosis, unspecified: Secondary | ICD-10-CM | POA: Insufficient documentation

## 2024-02-06 DIAGNOSIS — Z79899 Other long term (current) drug therapy: Secondary | ICD-10-CM | POA: Diagnosis not present

## 2024-02-06 NOTE — Assessment & Plan Note (Addendum)
 She is doing well after IV iron infusion Her most recent CBC dated July 30 showed resolution of anemia The changes noted on the outside MRI scan is consistent with her anemia and not due to bone marrow disease The patient is reassured Myeloma panel is normal

## 2024-02-06 NOTE — Progress Notes (Signed)
 [facility] OFFICE PROGRESS NOTE  Patient Care Team: Cleotilde, [place]  E, PA as PCP - General (Internal Medicine) Jeffrie Oneil BROCKS, MD as PCP - Cardiology (Cardiology)  Assessment & Plan Iron deficiency anemia due to chronic blood loss She is doing well after IV iron infusion Her most recent CBC dated July 30 showed resolution of anemia The changes noted on the outside MRI scan is consistent with her anemia and not due to bone marrow disease The patient is reassured Myeloma panel is normal Right hip pain I have reviewed MRI report I recommend physical therapy versus referral to see sports medicine She will reach out to her primary care doctor  No orders of the defined types were placed in this encounter.    [name] Bedford, MD  INTERVAL HISTORY: she returns for surveillance follow-up and to discuss test results I saw her in May for iron deficiency anemia and prescribed 2 doses of intravenous iron infusion Around the time when she completes her IV iron, she developed acute hip pain She had MRI of the hip and MRI of the lumbar spine performed in another facility which showed signs of bursitis on her right hip, tendinitis as well as partial tear of the origins of the hamstring, as well as fraying anterior is on the anterior superior and posterior superior labrum of the right.  She had her MRI of her lumbar spine shows some low marrow signal and degenerative joint disease She is concerned about the abnormal reading on the MRI and hence went to her primary care doctor and had labs done We discussed and reviewed test results  PHYSICAL EXAMINATION: ECOG PERFORMANCE STATUS: 1 - Symptomatic but completely ambulatory  Vitals:   02/06/24 1513  BP: 127/75  Pulse: 62  Resp: 18  Temp: 98.2 F (36.8 C)  SpO2: 98%   Filed Weights   02/06/24 1513  Weight: 133 lb 9.6 oz (60.6 kg)    Relevant data reviewed during this visit included CBC, serum protein electrophoresis and MRI  result from July 2025

## 2024-02-06 NOTE — Assessment & Plan Note (Addendum)
 I have reviewed MRI report I recommend physical therapy versus referral to see sports medicine She will reach out to her primary care doctor

## 2024-02-10 ENCOUNTER — Other Ambulatory Visit: Payer: Self-pay | Admitting: Nurse Practitioner

## 2024-02-25 ENCOUNTER — Ambulatory Visit (INDEPENDENT_AMBULATORY_CARE_PROVIDER_SITE_OTHER): Admitting: Gastroenterology

## 2024-02-25 ENCOUNTER — Encounter: Payer: Self-pay | Admitting: Gastroenterology

## 2024-02-25 VITALS — BP 132/62 | HR 60 | Ht 62.5 in | Wt 132.4 lb

## 2024-02-25 DIAGNOSIS — K581 Irritable bowel syndrome with constipation: Secondary | ICD-10-CM

## 2024-02-25 DIAGNOSIS — K5900 Constipation, unspecified: Secondary | ICD-10-CM | POA: Diagnosis not present

## 2024-02-25 NOTE — Progress Notes (Signed)
 [name]    [id number]    1951/05/27  Primary Care Physician:Miller, [place]  E, PA  Referring Physician: Cleotilde Peri BRAVO, PA [address] [place],  [place] [address]   Chief complaint:  Abdominal pain, nausea, irritable bowel syndrome, constipation  Discussed the use of AI scribe software for clinical note transcription with the patient, who gave verbal consent to proceed.  History of Present Illness [name] is a 73 year old female who presents with ongoing bowel movement issues despite daily bowel movements.  Altered bowel habits - Ongoing bowel movement issues despite daily bowel movements - Averages one to two bowel movements per day - Larger bowel movements occur one to two times per week - Cramping and discomfort occur after bowel movements, particularly after larger ones - Persistent sensation of feeling unwell despite daily bowel movements - Current symptoms include ongoing discomfort  Laxative and bowel regimen - Uses a combination of probiotics, magnesium -based stool softeners, and Senna-containing laxatives - Takes one scoop of probiotic daily, which contains fiber and is perceived to have anti-inflammatory properties for the gut - Magnesium -based stool softener is well-tolerated - Senna-based laxative caused significant discomfort, leading to reduced use - Previously used Linzess , which was effective but inconvenient due to timing of induced bowel movements  Hydration and dietary management - Maintains a food journal - Consumes approximately 48 ounces of water  daily - Concerned about increasing water  intake due to small bladder and frequent urination  Impact on quality of life and travel concerns - Concerned about managing bowel symptoms during upcoming travel to [place]  in October      Outpatient Encounter Medications as of 02/25/2024  Medication Sig   acyclovir  (ZOVIRAX ) 400 MG tablet Take 400 mg by mouth as  needed.   AMBULATORY NON FORMULARY MEDICATION Medication Name: Gentle Move 2 in the morning and 1 in the evening   AMBULATORY NON FORMULARY MEDICATION Medication Name: Aloe Gold 2 tablets two times daily   AMBULATORY NON FORMULARY MEDICATION Medication Name: Lower Bowel stimulator 2 in the morning and 1 in the evening   AMBULATORY NON FORMULARY MEDICATION Medication Name: Probiotic daily   AMBULATORY NON FORMULARY MEDICATION Medication Name: Digestive vibrance powder 1 scoop daily   ARTIFICIAL TEAR SOLUTION OP Place 1 drop into both eyes 2 (two) times daily.   Aspirin  81 MG CAPS Take 81 mg by mouth daily.   atorvastatin  (LIPITOR ) 40 MG tablet Take 1 tablet (40 mg total) by mouth daily.   Cholecalciferol  (VITAMIN D ) 50 MCG (2000 UT) tablet Take 2,000 Units by mouth daily. Takes 1,000 mg daily   cyclobenzaprine (FLEXERIL) 10 MG tablet Take 10 mg by mouth daily as needed for muscle spasms.   dicyclomine  (BENTYL ) 10 MG capsule TAKE 1 CAPSULE TWICE DAILY FOR SPASM(S) AS NEEDED   diltiazem  2 % GEL Apply 1 Application topically as needed. For rectal spasm   ezetimibe  (ZETIA ) 10 MG tablet Take 1 tablet (10 mg total) by mouth daily.   fluticasone (FLONASE) 50 MCG/ACT nasal spray 1 spray as needed.   hydrALAZINE  (APRESOLINE ) 25 MG tablet Take one (1) tablet by mouth ( 25 mg) up to three times daily for systolic BP > 160.   hyoscyamine  (LEVSIN  SL) 0.125 MG SL tablet DISSOLVE 1 TABLET UNDER THE TONGUE EVERY DAY AS NEEDED   Lactobacillus (PROBIOTIC ACIDOPHILUS PO) Take 1 capsule by mouth daily as needed.   Multiple Vitamins-Minerals (HAIR SKIN AND NAILS  FORMULA) TABS Take 1 capsule by mouth daily.   nebivolol  (BYSTOLIC ) 2.5 MG tablet TAKE 1 TABLET DAILY   NEXIUM  40 MG capsule BRAND NAME ONLY- take 1 capsule daily as directed   nitroGLYCERIN  (NITROSTAT ) 0.4 MG SL tablet place 1 tablet under the tongue if needed every 5 minutes for chest pain for 3 doses IF NO RELIEF AFTER 3RD DOSE CALL PRESCRIBER OR 911.    oxyCODONE  (OXY IR/ROXICODONE ) 5 MG immediate release tablet Take 5 mg by mouth every 4 (four) hours as needed.   Peppermint Oil 90 MG CPCR Take 2 capsules by mouth as needed (indigestion).   QUEtiapine  (SEROQUEL ) 25 MG tablet Take 1 tablet (25 mg total) by mouth at bedtime.   ranolazine  (RANEXA ) 500 MG 12 hr tablet TAKE 1 TABLET TWICE A DAY   traMADol  (ULTRAM ) 50 MG tablet Take 50 mg by mouth as needed.   [DISCONTINUED] linaclotide  (LINZESS ) 72 MCG capsule Take 1 capsule (72 mcg total) by mouth daily before breakfast.   No facility-administered encounter medications on file as of 02/25/2024.    Allergies as of 02/25/2024 - Review Complete 02/25/2024  Allergen Reaction Noted   Clindamycin/lincomycin Other (See Comments) 10/05/2011   Doxycycline Nausea And Vomiting 10/05/2011   Escitalopram oxalate Nausea Only 10/05/2011   Levsinex timecaps [hyoscyamine ] Itching    Macrodantin Other (See Comments) 10/05/2011   Sulfa antibiotics  07/20/2020   Trazodone and nefazodone Nausea Only 10/05/2011   Trimethoprim Other (See Comments) 12/09/2013   Zoloft [sertraline] Other (See Comments) 11/04/2023   Lincomycin Rash 10/05/2011   Nitrofurantoin macrocrystal Anxiety and Rash     Past Medical History:  Diagnosis Date   Anxiety    Psychiatry Dr. Edger Macintosh, concerns for memory loss, anxiety   Arthritis    Broken heart syndrome    Cancer (HCC)    bladder CA   Cataract    Coronary atherosclerosis CARDIOLOGIST-  DR VICTORY SHARPS   Cath 11/2006 demonstrating moderate obstructive coronary disease with 70-80% septal perforator #1, 50-70% first diagonal, 80% third diagonal, 50% mid and distal LAD & heavy calcification throughout all 3 coronaries. LVF normal.   Essential hypertension, benign    Fatty liver 04/28/2012   GERD (gastroesophageal reflux disease)    Heart murmur    History of esophageal spasm    takes ranexa    History of TIA (transient ischemic attack)    2011--  no residuals    Hyperlipidemia    IBS (irritable bowel syndrome)    Diarrhea predominent, esophageal spasm severe, GERD - Dr. Princella People   Insomnia    Interstitial cystitis    Dr. Chales   Memory loss    Psychiatry Dr. Edger Macintosh, concerns for memory loss, anxiety   PONV (postoperative nausea and vomiting)    AND URINARY RETENTION   TMJ (temporomandibular joint syndrome)    Wears glasses     Past Surgical History:  Procedure Laterality Date   ABDOMINAL HYSTERECTOMY  AGE 64 -- 1989   APPENDECTOMY  1971   AND OVARIAN CYSTECTOMY   BENIGN EXCISIONAL BREAST BX  1996   BREAST EXCISIONAL BIOPSY Right 1992   CARDIAC CATHETERIZATION  11-28-2006   DR VICTORY SMITH    moderate cad/  70-80% septal perforator #1, 50-70% first diagonal, 80% third diagonal, 50% mid and distal LAD & heavy calcification throughout all 3 coronaries. LVF normal.   CARDIAC CATHETERIZATION  03-26-2012   DR VICTORY SHARPS   patent CFX,  RCA,  & pLAD/  mLAD >50% to <  70% a focal eccentric region that overlaps the third diagonal/  90% diagonal ostial and unchanged from prior study /   ef 65%   CARPAL TUNNEL RELEASE Bilateral    CYSTO WITH HYDRODISTENSION N/A 01/14/2014   Procedure: CYSTOSCOPY/HYDRODISTENSION/INSERTION OF MARCAINE  AND PYRIDUIM INTO BLADDER/INJECTION OF MARCAINE  AND KENALOG  into sub trigone space ;  Surgeon: Arlena LILLETTE Gal, MD;  Location: [facility];  Service: Urology;  Laterality: N/A;   CYSTO/  HYDRODISTENTION/  INSTILLATION THERAPY  03/12/2010   ESOPHAGOGASTRODUODENOSCOPY  04/30/2012   Procedure: ESOPHAGOGASTRODUODENOSCOPY (EGD);  Surgeon: Princella CHRISTELLA Nida, MD;  Location: [facility];  Service: Endoscopy;  Laterality: N/A;   EYE SURGERY Bilateral 2022   KNEE ARTHROSCOPY WITH MEDIAL MENISECTOMY Left 07/12/2021   Procedure: KNEE ARTHROSCOPY WITH PARTIAL MEDIAL MENISECTOMY, CHONDROPLASTY;  Surgeon: Ernie Cough, MD;  Location: [facility];  Service: Orthopedics;  Laterality: Left;   LEFT HEART CATH AND CORONARY  ANGIOGRAPHY N/A 02/18/2017   Procedure: LEFT HEART CATH AND CORONARY ANGIOGRAPHY;  Surgeon: Claudene Victory ORN, MD;  Location: [facility];  Service: Cardiovascular;  Laterality: N/A;   LUMBAR DISC SURGERY  1973   SHOULDER ARTHROSCOPY Left 12/23/2011   SHOULDER SURGERY Right 2010   TRANSTHORACIC ECHOCARDIOGRAM  04/21/2012   GRADE I DIASTOLIC DYSFUNCTION/  EF 60-65%/  MILD MR  &  TR   TRANSURETHRAL RESECTION OF BLADDER TUMOR Right 01/03/2015   Procedure: TRANSURETHRAL RESECTION OF BLADDER TUMOR (TURBT);  Surgeon: Arlena Gal, MD;  Location: [facility];  Service: Urology;  Laterality: Right;   TRIGGER FINGER RELEASE Bilateral 2017    Family History  Problem Relation Age of Onset   Heart disease Mother    Colon polyps Mother    Alzheimer's disease Mother    Dementia Mother    CAD Mother    Heart disease Father    Diverticulosis Father    CAD Father        in his 7s   Breast cancer Sister 26       Pam   Heart disease Brother    Osteoarthritis Other        Multiple family members   Obesity Other        Multiple family members   Depression Other        Multiple family members   Colon cancer Neg Hx    Esophageal cancer Neg Hx    Stomach cancer Neg Hx    Rectal cancer Neg Hx    Pancreatic cancer Neg Hx     Social History   Socioeconomic History   Marital status: Married    Spouse name: Not on file   Number of children: 2   Years of education: Not on file   Highest education level: Not on file  Occupational History   Occupation: caregiver  Tobacco Use   Smoking status: Former    Current packs/day: 0.00    Average packs/day: 1 pack/day for 10.0 years (10.0 ttl pk-yrs)    Types: Cigarettes    Start date: 06/11/1963    Quit date: 06/10/1973    Years since quitting: 50.7   Smokeless tobacco: Never  Vaping Use   Vaping status: Never Used  Substance and Sexual Activity   Alcohol use: Yes    Alcohol/week: 2.0 standard drinks of alcohol    Types: 2  Glasses of wine per week    Comment: social   Drug use: No   Sexual activity: Yes  Other Topics Concern   Not  on file  Social History Narrative   Are you right handed or left handed? Right   Are you currently employed ? N    What is your current occupation?   Do you live at home alone? husband   Who lives with you?    What type of home do you live in: 1 story or 2 story? 1       Social Drivers of Corporate investment banker Strain: Not on file  Food Insecurity: Not on file  Transportation Needs: Not on file  Physical Activity: Not on file  Stress: Not on file  Social Connections: Not on file  Intimate Partner Violence: Not on file      Review of systems: All other review of systems negative except as mentioned in the HPI.   Physical Exam: Vitals:   02/25/24 0901  BP: 132/62  Pulse: 60  SpO2: 98%   Body mass index is 23.83 kg/m. Gen:      No acute distress HEENT:  sclera anicteric Abd:      soft, non-tender; no palpable masses, no distension Ext:    No edema Neuro: alert and oriented x 3 Psych: normal mood and affect  Data Reviewed:  Reviewed labs, radiology imaging, old records and pertinent past GI work up     Assessment and Plan Assessment & Plan Chronic constipation Chronic constipation with incomplete evacuation despite daily bowel movements, accompanied by cramping and occasional large bowel movements. Current regimen includes probiotics, magnesium , and fiber supplements, with inadequate relief and concerns about bowel habits during travel. - Continue current probiotic and magnesium  regimen. - Switch to psyllium husk and Benefiber for cost-effective fiber supplementation. - Take one scoop of psyllium husk and Benefiber twice daily, adjusting as needed. - Increase water  intake to 60-80 ounces daily to aid fiber effectiveness. - Split magnesium  dosage to two capsules in the morning and two in the evening. - Monitor bowel movement frequency and adjust  fiber and magnesium  intake as needed. - Encourage gradual increase in water  intake  - Reassure about bowel habits during travel and encourage adherence to regimen.  Return in 6 months      This visit required >30 minutes of patient care (this includes precharting, chart review, review of results, face-to-face time used for counseling as well as treatment plan and follow-up. The patient was provided an opportunity to ask questions and all were answered. The patient agreed with the plan and demonstrated an understanding of the instructions.  LOIS Wilkie Mcgee , MD    CC: Cleotilde, [place]  E, PA

## 2024-02-25 NOTE — Patient Instructions (Signed)
 VISIT SUMMARY:  During your visit, we discussed your ongoing bowel movement issues and made adjustments to your current regimen to help manage your symptoms, especially with your upcoming travel in mind.  YOUR PLAN:  CHRONIC CONSTIPATION: You have ongoing constipation with incomplete evacuation, cramping, and occasional large bowel movements despite daily bowel movements. -Continue taking your current probiotic and magnesium  regimen. -Switch to psyllium husk and Benefiber for fiber supplementation. Take one scoop of each twice daily and adjust as needed. -Increase your water  intake to 60-80 ounces daily to help the fiber work better. -Split your magnesium  dosage to two capsules in the morning and two in the evening. -Monitor your bowel movement frequency and adjust your fiber and magnesium  intake as needed. -Gradually increase your water  intake to avoid overwhelming your bladder. -Stick to your regimen to manage your bowel habits during your upcoming travel.

## 2024-03-04 ENCOUNTER — Other Ambulatory Visit: Payer: Self-pay | Admitting: Internal Medicine

## 2024-03-04 DIAGNOSIS — Z0000 Encounter for general adult medical examination without abnormal findings: Secondary | ICD-10-CM

## 2024-03-08 ENCOUNTER — Other Ambulatory Visit: Payer: Self-pay | Admitting: Nurse Practitioner

## 2024-03-16 ENCOUNTER — Ambulatory Visit
Admission: RE | Admit: 2024-03-16 | Discharge: 2024-03-16 | Disposition: A | Discharge status: Home / Self Care | Source: Ambulatory Visit | Attending: Internal Medicine | Admitting: Internal Medicine

## 2024-03-16 DIAGNOSIS — Z1231 Encounter for screening mammogram for malignant neoplasm of breast: Secondary | ICD-10-CM | POA: Diagnosis not present

## 2024-03-16 DIAGNOSIS — Z0000 Encounter for general adult medical examination without abnormal findings: Secondary | ICD-10-CM

## 2024-03-17 DIAGNOSIS — N3091 Cystitis, unspecified with hematuria: Secondary | ICD-10-CM | POA: Diagnosis not present

## 2024-03-23 ENCOUNTER — Telehealth: Payer: Self-pay | Admitting: Cardiology

## 2024-03-23 DIAGNOSIS — I428 Other cardiomyopathies: Secondary | ICD-10-CM

## 2024-03-23 DIAGNOSIS — R002 Palpitations: Secondary | ICD-10-CM

## 2024-03-23 DIAGNOSIS — I10 Essential (primary) hypertension: Secondary | ICD-10-CM

## 2024-03-23 DIAGNOSIS — I25118 Atherosclerotic heart disease of native coronary artery with other forms of angina pectoris: Secondary | ICD-10-CM

## 2024-03-23 MED ORDER — NITROGLYCERIN 0.4 MG SL SUBL: SUBLINGUAL_TABLET | SUBLINGUAL | 0 refills | Status: AC

## 2024-03-23 NOTE — Telephone Encounter (Signed)
  Pt c/o of Chest Pain: STAT if active CP, including tightness, pressure, jaw pain, radiating pain to shoulder/upper arm/back, CP unrelieved by Nitro. Symptoms reported of SOB, nausea, vomiting, sweating.  1. Are you having CP right now?   No  2. Are you experiencing any other symptoms (ex. SOB, nausea, vomiting, sweating)?   No  3. Is your CP continuous or coming and going?  Comes and goes with walking  4. Have you taken Nitroglycerin ?   No  5. How long have you been experiencing CP?   Within the last couple of weeks but has been happening more frequently   6. If NO CP at time of call then end call with telling Pt to call back or call 911 if Chest pain returns prior to return call from triage team.   Patient stated she has been having chest discomfort when walking.  Patient stated she has been taking 1/2 dose of her ranolazine  (RANEXA ) 500 MG 12 hr tablet.  Patient wants a call back tomorrow morning.

## 2024-03-23 NOTE — Telephone Encounter (Signed)
 RX sent in

## 2024-03-23 NOTE — Telephone Encounter (Signed)
*  STAT* If patient is at the pharmacy, call can be transferred to refill team.   1. Which medications need to be refilled? (please list name of each medication and dose if known)   nitroGLYCERIN  (NITROSTAT ) 0.4 MG SL tablet   2. Would you like to learn more about the convenience, safety, & potential cost savings by using the [facility]?   3. Are you open to using the [facility] (Type [facility]. ).  4. Which pharmacy/location (including street and city if local pharmacy) is medication to be sent to?  [id number] - [place], [place] - [address]   5. Do they need a 30 day or 90 day supply?   Patient stated her current medication has expired.  Patient has appointment scheduled on 1/6 with Dr. Jeffrie.

## 2024-03-24 NOTE — Telephone Encounter (Signed)
 Spoke with pt who complains of increased anginal pain x 2 months.  Pt reports she has not taken her Ranolazine  500mg  as prescribed for the past 9-12 months.  She has only been taking this medication once daily.  Pt encouraged to return to Ranolazine  500mg  - 1 tablet by mouth twice daily.  Previous Dr Claudene patient.  She is scheduled to see Dr Jeffrie in January.  Offered pt an earlier appointment with APP.  She declines saying she wants to see the provider Dr Claudene recommended.  Appointment scheduled with Dr Jeffrie on 04/20/24.  Reviewed ED precautions.  Pt verbalizes understanding and agrees with current plan.

## 2024-04-06 ENCOUNTER — Other Ambulatory Visit: Payer: Self-pay | Admitting: Internal Medicine

## 2024-04-06 DIAGNOSIS — C689 Malignant neoplasm of urinary organ, unspecified: Secondary | ICD-10-CM | POA: Diagnosis not present

## 2024-04-06 DIAGNOSIS — R1033 Periumbilical pain: Secondary | ICD-10-CM | POA: Diagnosis not present

## 2024-04-06 DIAGNOSIS — R140 Abdominal distension (gaseous): Secondary | ICD-10-CM

## 2024-04-06 DIAGNOSIS — R1030 Lower abdominal pain, unspecified: Secondary | ICD-10-CM

## 2024-04-06 DIAGNOSIS — R399 Unspecified symptoms and signs involving the genitourinary system: Secondary | ICD-10-CM | POA: Diagnosis not present

## 2024-04-06 DIAGNOSIS — Z8551 Personal history of malignant neoplasm of bladder: Secondary | ICD-10-CM | POA: Diagnosis not present

## 2024-04-08 ENCOUNTER — Ambulatory Visit: Payer: Medicare Other | Admitting: Psychiatry

## 2024-04-08 DIAGNOSIS — F5101 Primary insomnia: Secondary | ICD-10-CM

## 2024-04-08 MED ORDER — QUETIAPINE FUMARATE 25 MG PO TABS: 25.0000 mg | ORAL_TABLET | Freq: Every day | ORAL | 0 refills | Status: DC

## 2024-04-08 MED ORDER — ESZOPICLONE 3 MG PO TABS: 3.0000 mg | ORAL_TABLET | Freq: Every day | ORAL | 0 refills | Status: AC

## 2024-04-08 NOTE — Patient Instructions (Signed)
 Start Lunesta 3 mg tablet 1 right before sleep If it works then reduce quetiapine  to 1/2 tablet at night for 2 weeks,  Then reduce quetiapine  to 1/4 tablet at night for 2 weeks then stop it.

## 2024-04-08 NOTE — Progress Notes (Signed)
 [name] [id number] 09-26-1950 73 y.o.  Subjective:   Patient ID:  [name] is a 73 y.o. (DOB [birth year]) female.  Chief Complaint:  Chief Complaint  Patient presents with   Follow-up   Sleeping Problem    HPI   [name] presents to the office today for follow-up of chronic insomnia and some anxiety.  seen March 2020.  No meds were changed.  Taking quetiapine  daily. 80% of the time it is effective.    08/14/20 appt noted: Seroquel  does work, but hangover an hour, but worth it for the sleep.  Goes to sleep and stays asleepMay stay up too late and miss the window at times.  Busy.  Not morning person. No other SE. Overall OK.  Still concerned about [name] and his memory and her memory too.  Daughter continues to be a challenge Re: health.  Son Viktoria is a patient here and CO not going to sleep on time.  He's 38.  Also concerned with [name]'s mental health and both his brother's have had sons who committed suicide.  1 of his B with Alzheimers and she's had chronic worry over getting Alzheimer's.  [name] is good socially and exercises.  He does Bible studies and is life of the party publicly.  But she worries over his memory at home. He has agreed to see a neurologist.   She still worries over her memory and cog function too.  Still complains of chronic worry issues and more worry over word-finding problems.  But she has no functional impairment.  Patient reports stable mood and denies depressed or irritable moods.  Patient denies any recent difficulty with anxiety.  Patient denies difficulty with sleep initiation or maintenance as long as she takes the quetiapine .  Gets 7 hours usually.  Not drowsy daytime unless she sits to read she's tired chronically. Denies appetite disturbance.  Patient reports that energy and motivation have been good.  Patient denies any difficulty with concentration.  Patient denies any suicidal ideation. Goes to gym. 4 years clean from cancer. Plan:  Continue low dose quetiapine  25 mg HS for TR insomnia. Disc NAC off label for cog  04/16/2021 appointment with the following noted: [name]'s B died Alzheimer's. She's concerned about tico's memory and judgment but neuropsych testing was unremarkable. She asked that I review the document with her and answer questions. Disc whether or not one can fake neuropsych testing. Usually 7 hours of sleep. No NAC tried bc of afraid of stomach problems, but she agrees to try. Plan: no med changes  04/15/22 appt noted: Cycles of sleep trouble.   Still taking Seroquel  25 mg HS.  Some days not working but partly her fault bc night owl and will fight through sleepiness.  Hangover in the morning about 90 mins.  Never felt restored in the AM. Chronic GI issues.  Don't do well with generic Nexium  and cost issues. Chronic concerns about memory.  Always direction challenged and some trouble deciding how to get here this morning.  Word finding etc.  She asks about getting another MRI.  She had one in 2011 which was normal.  No other neuro changes.   She's not sure she ever tried  NAC. [name] is still active physically and active but she still worries over his cognition. Plan: trial chlorpromazine  25 mg HS.  She wants to try it for TR insomnia.    04/08/23 appt noted: She wanted to continue Seroquel  25 mg HS for sleep vs chlopromazine.  Bc latter didn't work. Sleep much worse without Seroquel .   Working on sleep hygiene ongoing looking at time on onset and getting up.   Asks question about timing and sleep.   If not active in the morning will fall asleep like in church.  This has been true for decades and not new with Seroquel . Had Covid 2nd time in June and still fatigue after it.  Tries not to nap daytime bc of insomnia but will have to nap at times.   Overall thinks Seroquel  is ok.   Some concerns that she is losing skills in organization and multitasking.   Still some concerns with H's memory but he's been  worked up also.   Perfectionistic and that can interfere with her performance.  Can feel unsettled with significant changes in her environment.   Can pull off party planning and accomplishment.  Things seem a little harder but can do them.   Very involved with others at church.   Still focus problems that probably are the source of memory issues.  I can go all over the place mentally. Avg 7-8 hours of sleep.   04/08/24 appt noted:  Med: Continue low dose quetiapine  25 mg HS for TR insomnia bc clearly helpful SE hangover Can't sleep if takes less.  Asks about alternatives.   Can't sleep without quetiapine .   Having GI issues. H having trouble adjusting to declining health and mental health.   Past Psychiatric Medication Trials:  Multiple sleep med failures including  temazepam ,  clonazepam with sedation,  alprazolam  with no response,  trazodone GI side effects,  doxylamine,  Belsomra no response,  quetiapine  good response at 25 mg she has hangover Chlorpromazine  NR  Lexapro nausea,   sertraline 25 mg with cognitive side effects,  .    She has been treated treated for anxiety as a way of trying to address the insomnia but that was not an adequate solution.  Prior neuropsychological testing 2004 because the patient had complaints of memory problems was negative for significant cognitive impairment  GD's psych issues.  Review of Systems:  Review of Systems  Cardiovascular:  Negative for chest pain and palpitations.  Gastrointestinal:  Positive for abdominal pain and nausea.  Musculoskeletal:  Positive for arthralgias. Negative for back pain.  Neurological:  Negative for tremors and weakness.  Psychiatric/Behavioral:  Positive for decreased concentration and sleep disturbance. Negative for agitation, behavioral problems, confusion, dysphoric mood, hallucinations, self-injury and suicidal ideas. The patient is not nervous/anxious and is not hyperactive.     Medications: I have  reviewed the patient's current medications.  Current Outpatient Medications  Medication Sig Dispense Refill   atorvastatin  (LIPITOR ) 40 MG tablet Take 1 tablet (40 mg total) by mouth daily. 90 tablet 2   Eszopiclone 3 MG TABS Take 1 tablet (3 mg total) by mouth at bedtime. Take immediately before bedtime 20 tablet 0   traMADol  (ULTRAM ) 50 MG tablet Take 50 mg by mouth as needed.     acyclovir  (ZOVIRAX ) 400 MG tablet Take 400 mg by mouth as needed.     AMBULATORY NON FORMULARY MEDICATION Medication Name: Gentle Move 2 in the morning and 1 in the evening     AMBULATORY NON FORMULARY MEDICATION Medication Name: Aloe Gold 2 tablets two times daily     AMBULATORY NON FORMULARY MEDICATION Medication Name: Lower Bowel stimulator 2 in the morning and 1 in the evening     AMBULATORY NON FORMULARY MEDICATION Medication Name: Probiotic daily  AMBULATORY NON FORMULARY MEDICATION Medication Name: Digestive vibrance powder 1 scoop daily     ARTIFICIAL TEAR SOLUTION OP Place 1 drop into both eyes 2 (two) times daily.     Aspirin  81 MG CAPS Take 81 mg by mouth daily.     Cholecalciferol  (VITAMIN D ) 50 MCG (2000 UT) tablet Take 2,000 Units by mouth daily. Takes 1,000 mg daily     diltiazem  2 % GEL Apply 1 Application topically as needed. For rectal spasm 30 g 1   ezetimibe  (ZETIA ) 10 MG tablet Take 1 tablet (10 mg total) by mouth daily. 90 tablet 3   fluticasone (FLONASE) 50 MCG/ACT nasal spray 1 spray as needed.     hydrALAZINE  (APRESOLINE ) 25 MG tablet Take one (1) tablet by mouth ( 25 mg) up to three times daily for systolic BP > 160. 90 tablet 1   hyoscyamine  (LEVSIN  SL) 0.125 MG SL tablet DISSOLVE 1 TABLET UNDER THE TONGUE EVERY DAY AS NEEDED 90 tablet 3   Lactobacillus (PROBIOTIC ACIDOPHILUS PO) Take 1 capsule by mouth daily as needed.     Multiple Vitamins-Minerals (HAIR SKIN AND NAILS FORMULA) TABS Take 1 capsule by mouth daily.     nebivolol  (BYSTOLIC ) 2.5 MG tablet TAKE 1 TABLET DAILY 90 tablet 1    NEXIUM  40 MG capsule BRAND NAME ONLY- take 1 capsule daily as directed 90 capsule 3   nitroGLYCERIN  (NITROSTAT ) 0.4 MG SL tablet place 1 tablet under the tongue if needed every 5 minutes for chest pain for 3 doses IF NO RELIEF AFTER 3RD DOSE CALL PRESCRIBER OR 911. 25 tablet 0   Peppermint Oil 90 MG CPCR Take 2 capsules by mouth as needed (indigestion).     QUEtiapine  (SEROQUEL ) 25 MG tablet Take 1 tablet (25 mg total) by mouth at bedtime. 90 tablet 0   ranolazine  (RANEXA ) 500 MG 12 hr tablet TAKE 1 TABLET TWICE A DAY 180 tablet 1   No current facility-administered medications for this visit.    Medication Side Effects:  A little hard to get out of the bed  Allergies:  Allergies  Allergen Reactions   Clindamycin/Lincomycin Other (See Comments)    Pt not sure of reaction    Doxycycline Nausea And Vomiting   Escitalopram Oxalate Nausea Only   Levsinex Timecaps [Hyoscyamine ] Itching   Macrodantin Other (See Comments)    Flushed, hot   Sulfa Antibiotics     Unknown reaction    Trazodone And Nefazodone Nausea Only   Trimethoprim Other (See Comments)    unknown    Zoloft [Sertraline] Other (See Comments)    Confusion   Lincomycin Rash   Nitrofurantoin Macrocrystal Anxiety and Rash    Flushing, hot face and ears    Past Medical History:  Diagnosis Date   Anxiety    Psychiatry Dr. Edger Macintosh, concerns for memory loss, anxiety   Arthritis    Broken heart syndrome    Cancer (HCC)    bladder CA   Cataract    Coronary atherosclerosis CARDIOLOGIST-  DR VICTORY SHARPS   Cath 11/2006 demonstrating moderate obstructive coronary disease with 70-80% septal perforator #1, 50-70% first diagonal, 80% third diagonal, 50% mid and distal LAD & heavy calcification throughout all 3 coronaries. LVF normal.   Essential hypertension, benign    Fatty liver 04/28/2012   GERD (gastroesophageal reflux disease)    Heart murmur    History of esophageal spasm    takes ranexa    History of TIA  (transient ischemic attack)  2011--  no residuals   Hyperlipidemia    IBS (irritable bowel syndrome)    Diarrhea predominent, esophageal spasm severe, GERD - Dr. Princella People   Insomnia    Interstitial cystitis    Dr. Chales   Memory loss    Psychiatry Dr. Edger Macintosh, concerns for memory loss, anxiety   PONV (postoperative nausea and vomiting)    AND URINARY RETENTION   TMJ (temporomandibular joint syndrome)    Wears glasses     Family History  Problem Relation Age of Onset   Heart disease Mother    Colon polyps Mother    Alzheimer's disease Mother    Dementia Mother    CAD Mother    Heart disease Father    Diverticulosis Father    CAD Father        in his 19s   Breast cancer Sister 58       Pam   Heart disease Brother    Osteoarthritis Other        Multiple family members   Obesity Other        Multiple family members   Depression Other        Multiple family members   Colon cancer Neg Hx    Esophageal cancer Neg Hx    Stomach cancer Neg Hx    Rectal cancer Neg Hx    Pancreatic cancer Neg Hx     Social History   Socioeconomic History   Marital status: Married    Spouse name: Not on file   Number of children: 2   Years of education: Not on file   Highest education level: Not on file  Occupational History   Occupation: caregiver  Tobacco Use   Smoking status: Former    Current packs/day: 0.00    Average packs/day: 1 pack/day for 10.0 years (10.0 ttl pk-yrs)    Types: Cigarettes    Start date: 06/11/1963    Quit date: 06/10/1973    Years since quitting: 50.8   Smokeless tobacco: Never  Vaping Use   Vaping status: Never Used  Substance and Sexual Activity   Alcohol use: Yes    Alcohol/week: 2.0 standard drinks of alcohol    Types: 2 Glasses of wine per week    Comment: social   Drug use: No   Sexual activity: Yes  Other Topics Concern   Not on file  Social History Narrative   Are you right handed or left handed? Right   Are you currently  employed ? N    What is your current occupation?   Do you live at home alone? husband   Who lives with you?    What type of home do you live in: 1 story or 2 story? 1       Social Drivers of Corporate Investment Banker Strain: Not on file  Food Insecurity: Not on file  Transportation Needs: Not on file  Physical Activity: Not on file  Stress: Not on file  Social Connections: Not on file  Intimate Partner Violence: Not on file    Past Medical History, Surgical history, Social history, and Family history were reviewed and updated as appropriate.   Please see review of systems for further details on the patient's review from today.   Objective:   Physical Exam:  There were no vitals taken for this visit.  Physical Exam Constitutional:      General: She is not in acute distress.    Appearance: She is  well-developed.  Musculoskeletal:        General: No deformity.  Neurological:     Mental Status: She is alert and oriented to person, place, and time.     Motor: No tremor.     Coordination: Coordination normal.     Gait: Gait normal.  Psychiatric:        Attention and Perception: Attention normal. She is attentive.        Mood and Affect: Mood is anxious. Mood is not depressed. Affect is not labile, blunt, angry, tearful or inappropriate.        Speech: Speech normal. Speech is not rapid and pressured.        Behavior: Behavior normal.        Thought Content: Thought content normal. Thought content is not delusional. Thought content does not include homicidal or suicidal ideation. Thought content does not include suicidal plan.        Cognition and Memory: Cognition normal. She does not exhibit impaired recent memory.        Judgment: Judgment normal.     Comments: Insight is good. Residual anxiety issues probably better talkative     Lab Review:     Component Value Date/Time   NA 140 08/25/2023 1048   K 4.5 08/25/2023 1048   CL 104 08/25/2023 1048   CO2 25  08/25/2023 1048   GLUCOSE 76 08/25/2023 1048   GLUCOSE 110 (H) 02/07/2023 1454   BUN 15 08/25/2023 1048   CREATININE 0.78 08/25/2023 1048   CREATININE 0.80 01/12/2018 1031   CALCIUM  9.0 08/25/2023 1048   PROT 6.4 08/25/2023 1048   ALBUMIN 4.1 08/25/2023 1048   AST 22 08/25/2023 1048   ALT 21 08/25/2023 1048   ALKPHOS 120 08/25/2023 1048   BILITOT 0.2 08/25/2023 1048   GFRNONAA >60 02/07/2023 1454   GFRNONAA 77 01/12/2018 1031   GFRAA 86 11/03/2018 1029   GFRAA 89 01/12/2018 1031       Component Value Date/Time   WBC 7.2 02/07/2023 1454   RBC 4.42 02/07/2023 1454   HGB 12.2 02/07/2023 1454   HGB 12.1 11/06/2016 1545   HCT 38.0 02/07/2023 1454   HCT 37.8 11/06/2016 1545   PLT 336 02/07/2023 1454   PLT 281 11/06/2016 1545   MCV 86.0 02/07/2023 1454   MCV 89 11/06/2016 1545   MCH 27.6 02/07/2023 1454   MCHC 32.1 02/07/2023 1454   RDW 14.6 02/07/2023 1454   RDW 14.8 11/06/2016 1545   LYMPHSABS 1.5 12/24/2019 1505   MONOABS 0.6 12/24/2019 1505   EOSABS 0.1 12/24/2019 1505   BASOSABS 0.0 12/24/2019 1505    No results found for: POCLITH, LITHIUM   No results found for: PHENYTOIN, PHENOBARB, VALPROATE, CBMZ   .res Assessment: Plan:    Primary insomnia - Plan: QUEtiapine  (SEROQUEL ) 25 MG tablet, Eszopiclone 3 MG TABS  Word-finding problems May have underlying ADD but risk benefit of starting stimulants at her age are not ideal  30 min Supportive therapy on dealing with worries chronically over sleep and memory for herself and concerns re: H's memory too.     Disc chronic worry over her own memory issues with normal neuropsych testing.  Normalized some cognitive decline with age.  She recognizes that distractions that interfere with conc interfere with memory and works to minimize that now.  Disc risk of sleep meds and risk of not taking them and having poor sleep.  Disc sleep hygiene. Adequate sleep is important for brain health.  Re:  quetiapine  low dosage  off label for sleep bc of TR insomnia and multiple other med failures.   It has been successful generally.  Discussed potential metabolic side effects associated with atypical antipsychotics, as well as potential risk for movement side effects. Advised pt to contact office if movement side effects occur.   Overall sleep managed but cannot without quetiapine   Continue low dose quetiapine  25 mg HS for TR insomnia bc clearly helpful  Option donepezil but more GI risk than NAC  Again disc sleep hygiene and timing of meds  Counseling again over H's mental health changes and trouble making decisions.  He's going to see neuropshychologist.  He's 73 yo.   Trial:  Start Lunesta 3 mg tablet 1 right before sleep If it works then reduce quetiapine  to 1/2 tablet at night for 2 weeks,  Then reduce quetiapine  to 1/4 tablet at night for 2 weeks then stop it.  FU 1 year  Lorene Macintosh, MD, DFAPA  Please see After Visit Summary for patient specific instructions.  Future Appointments  Date Time Provider Department Center  04/12/2024  2:40 PM [facility] 2 [facility] [facility]  04/20/2024  2:40 PM Jeffrie Oneil BROCKS, MD [facility]    No orders of the defined types were placed in this encounter.     -------------------------------

## 2024-04-09 ENCOUNTER — Encounter: Payer: Self-pay | Admitting: Gastroenterology

## 2024-04-09 DIAGNOSIS — Z23 Encounter for immunization: Secondary | ICD-10-CM | POA: Diagnosis not present

## 2024-04-09 DIAGNOSIS — Z87891 Personal history of nicotine dependence: Secondary | ICD-10-CM | POA: Diagnosis not present

## 2024-04-09 DIAGNOSIS — Z8551 Personal history of malignant neoplasm of bladder: Secondary | ICD-10-CM | POA: Diagnosis not present

## 2024-04-09 DIAGNOSIS — R399 Unspecified symptoms and signs involving the genitourinary system: Secondary | ICD-10-CM | POA: Diagnosis not present

## 2024-04-09 DIAGNOSIS — C674 Malignant neoplasm of posterior wall of bladder: Secondary | ICD-10-CM | POA: Diagnosis not present

## 2024-04-09 DIAGNOSIS — Z08 Encounter for follow-up examination after completed treatment for malignant neoplasm: Secondary | ICD-10-CM | POA: Diagnosis not present

## 2024-04-12 ENCOUNTER — Ambulatory Visit
Admission: RE | Admit: 2024-04-12 | Discharge: 2024-04-12 | Disposition: A | Source: Ambulatory Visit | Attending: Internal Medicine | Admitting: Internal Medicine

## 2024-04-12 DIAGNOSIS — K439 Ventral hernia without obstruction or gangrene: Secondary | ICD-10-CM | POA: Diagnosis not present

## 2024-04-12 DIAGNOSIS — R1030 Lower abdominal pain, unspecified: Secondary | ICD-10-CM

## 2024-04-12 DIAGNOSIS — R140 Abdominal distension (gaseous): Secondary | ICD-10-CM

## 2024-04-12 DIAGNOSIS — R1033 Periumbilical pain: Secondary | ICD-10-CM

## 2024-04-12 DIAGNOSIS — Z8551 Personal history of malignant neoplasm of bladder: Secondary | ICD-10-CM

## 2024-04-12 MED ORDER — IOPAMIDOL (ISOVUE-300) INJECTION 61%
100.0000 mL | Freq: Once | INTRAVENOUS | Status: AC | PRN
  Administered 2024-04-12: 100 mL via INTRAVENOUS

## 2024-04-14 ENCOUNTER — Telehealth: Payer: Self-pay | Admitting: *Deleted

## 2024-04-14 NOTE — Telephone Encounter (Signed)
 Received call from patient.  At previous visit she had MRI done within 90 day Iron infusion period and mentioned that there were abnormal findings.  Most recently she had CT of Abd/Pelvis that reflect again abnormal findings and recommended repeat MRI of Lumbar.  She wants Dr Buzz opinion on the recent scan and how to proceed.  Routed to Dr Lonn.

## 2024-04-15 NOTE — Telephone Encounter (Signed)
 I agree with radiologist interpretation that she should get an MRI to clarify further

## 2024-04-15 NOTE — Telephone Encounter (Signed)
 [name],  IV iron can affect MRI reading up to 3-6 months, typically affects liver more than other organs

## 2024-04-15 NOTE — Telephone Encounter (Signed)
 Called and given below message. She verbalized understanding. She is asking if you can look at CT on 11/3 lesion of L1 vertebral body, may represent a benign lesion such as hemangioma however early osseous metastasis can not be excluded.   Last IV iron 12/23/23. PCP is ordering MRI of the spine to evaluate. She is waiting on them to schedule MRI.

## 2024-04-15 NOTE — Telephone Encounter (Signed)
 Called and given below message. She verbalized understanding and will schedule MRI.

## 2024-04-20 ENCOUNTER — Encounter: Payer: Self-pay | Admitting: Cardiology

## 2024-04-20 ENCOUNTER — Ambulatory Visit: Attending: Cardiology | Admitting: Cardiology

## 2024-04-20 VITALS — BP 136/74 | HR 56 | Ht 62.5 in | Wt 130.0 lb

## 2024-04-20 DIAGNOSIS — I209 Angina pectoris, unspecified: Secondary | ICD-10-CM

## 2024-04-20 DIAGNOSIS — I25119 Atherosclerotic heart disease of native coronary artery with unspecified angina pectoris: Secondary | ICD-10-CM

## 2024-04-20 DIAGNOSIS — I428 Other cardiomyopathies: Secondary | ICD-10-CM | POA: Diagnosis not present

## 2024-04-20 NOTE — Patient Instructions (Addendum)
 Medication Instructions:  The current medical regimen is effective;  continue present plan and medications.  *If you need a refill on your cardiac medications before your next appointment, please call your pharmacy*  Testing/Procedures:    Please report to Radiology at the [facility] Main Entrance 30 minutes early for your test.  [address] [place], [place] [address]  How to Prepare for Your Cardiac PET/CT Stress Test:  Nothing to eat or drink, except water , 3 hours prior to arrival time.  NO caffeine/decaffeinated products, or chocolate 12 hours prior to arrival. (Please note decaffeinated beverages (teas/coffees) still contain caffeine).  If you have caffeine within 12 hours prior, the test will need to be rescheduled.  Medication instructions: Do not take erectile dysfunction medications for 72 hours prior to test (sildenafil, tadalafil) Do not take nitrates (isosorbide mononitrate, Ranexa ) the day before or day of test Do not take tamsulosin the day before or morning of test Hold theophylline containing medications for 12 hours. Hold Dipyridamole 48 hours prior to the test.  Diabetic Preparation: If able to eat breakfast prior to 3 hour fasting, you may take all medications, including your insulin. Do not worry if you miss your breakfast dose of insulin - start at your next meal. If you do not eat prior to 3 hour fast-Hold all diabetes (oral and insulin) medications. Patients who wear a continuous glucose monitor MUST remove the device prior to scanning.  You may take your remaining medications with water .  NO perfume, cologne or lotion on chest or abdomen area. FEMALES - Please avoid wearing dresses to this appointment.  Total time is 1 to 2 hours; you may want to bring reading material for the waiting time.  IF YOU THINK YOU MAY BE PREGNANT, OR ARE NURSING PLEASE INFORM THE TECHNOLOGIST.  In preparation for your appointment, medication and supplies will be  purchased.  Appointment availability is limited, so if you need to cancel or reschedule, please call the Radiology Department Scheduler at [phone] 24 hours in advance to avoid a cancellation fee of $100.00  What to Expect When you Arrive:  Once you arrive and check in for your appointment, you will be taken to a preparation room within the Radiology Department.  A technologist or Nurse will obtain your medical history, verify that you are correctly prepped for the exam, and explain the procedure.  Afterwards, an IV will be started in your arm and electrodes will be placed on your skin for EKG monitoring during the stress portion of the exam. Then you will be escorted to the PET/CT scanner.  There, staff will get you positioned on the scanner and obtain a blood pressure and EKG.  During the exam, you will continue to be connected to the EKG and blood pressure machines.  A small, safe amount of a radioactive tracer will be injected in your IV to obtain a series of pictures of your heart along with an injection of a stress agent.    After your Exam:  It is recommended that you eat a meal and drink a caffeinated beverage to counter act any effects of the stress agent.  Drink plenty of fluids for the remainder of the day and urinate frequently for the first couple of hours after the exam.  Your doctor will inform you of your test results within 7-10 business days.  For more information and frequently asked questions, please visit our website: [email or web address]  For questions about your test or how to prepare  for your test, please call: Cardiac Imaging Nurse Navigators Office: [phone]   Follow-Up: At [facility], you and your health needs are our priority.  As part of our continuing mission to provide you with exceptional heart care, our providers are all part of one team.  This team includes your primary Cardiologist (physician) and Advanced Practice Providers or  APPs (Physician Assistants and Nurse Practitioners) who all work together to provide you with the care you need, when you need it.  Your next appointment:   1 year(s)  Provider:   One of our Advanced Practice Providers (APPs): Morse Clause, PA-C  Lamarr Satterfield, NP Miriam Shams, NP  Olivia Pavy, PA-C Josefa Beauvais, NP  Leontine Salen, PA-C Orren Fabry, PA-C  [place], PA-C Ernest Dick, NP  Damien Braver, NP Jon Hails, PA-C  Waddell Donath, PA-C    Dayna Dunn, PA-C  Scott Weaver, PA-C Lum Louis, NP Katlyn West, NP Callie Goodrich, PA-C  Xika Zhao, NP Sheng Haley, PA-C    Kathleen Johnson, PA-C       We recommend signing up for the patient portal called MyChart.  Sign up information is provided on this After Visit Summary.  MyChart is used to connect with patients for Virtual Visits (Telemedicine).  Patients are able to view lab/test results, encounter notes, upcoming appointments, etc.  Non-urgent messages can be sent to your provider as well.   To learn more about what you can do with MyChart, go to [email or web address].

## 2024-04-20 NOTE — Progress Notes (Signed)
 Cardiology Office Note:  .   Date:  04/20/2024  ID:  ROY SNUFFER, DOB 1950-09-10, MRN [id number] PCP: Cleotilde, Virginia  E, PA  [facility] Providers Cardiologist:  Oneil Parchment, MD     History of Present Illness: .   [name] is a 73 y.o. female Discussed the use of AI scribe   History of Present Illness [name] is a 73 year old female with coronary artery disease  She experiences worsening angina, described as a stronger and more intense sensation, particularly when walking uphill or waiting for her granddaughter's volleyball game. The angina has been more intense lately, especially when walking, compared to using the elliptical machine, which she can manage for 30 to 40 minutes. Previously, the pain would ease up after about nine minutes of walking.  She has a history of microvascular disease diagnosed in 2008. She has been taking Ranexa  but has not been consistent due to side effects of fatigue. She has recently resumed taking it daily after previously taking it sporadically.  Her past medical history includes a left heart catheterization in 2018 showing no significant coronary artery disease and a repeat echocardiogram in 2024 showing normal left ventricular function. She recalls being told she has small blood vessels affected by disease, which she did not realize for about ten years.  She recalls a previous coronary CT or calcium  score scan that revealed calcification, which was followed by a heart catheterization. She mentions having seven siblings who are curious about her condition.      Studies Reviewed: .        Results DIAGNOSTIC Left heart catheterization: No significant coronary artery disease (2018) Echocardiogram: Normal left ventricular function (2024) Risk Assessment/Calculations:            Physical Exam:   VS:  BP 136/74   Pulse (!) 56   Ht 5' 2.5 (1.588 m)   Wt 130 lb (59 kg)   SpO2 95%   BMI 23.40 kg/m    Wt Readings from  Last 3 Encounters:  04/20/24 130 lb (59 kg)  02/25/24 132 lb 6 oz (60 kg)  02/06/24 133 lb 9.6 oz (60.6 kg)    GEN: Well nourished, well developed in no acute distress NECK: No JVD; No carotid bruits CARDIAC: RRR, no murmurs, no rubs, no gallops RESPIRATORY:  Clear to auscultation without rales, wheezing or rhonchi  ABDOMEN: Soft, non-tender, non-distended EXTREMITIES:  No edema; No deformity   ASSESSMENT AND PLAN: .    Assessment and Plan Assessment & Plan Angina with microvascular coronary artery disease Intermittent angina with increased intensity and tightness, particularly during physical exertion such as walking uphill. Symptoms have been present for approximately ten years, with recent exacerbation. Previous diagnosis of microvascular coronary artery disease. Current management includes Ranexa , but she reports intolerance to full dosage due to fatigue. - Ordered cardiac PET scan to evaluate microvascular coronary artery disease and assess myocardial perfusion. - Continue Ranexa  at a reduced dosage due to intolerance.  History of non-ischemic cardiomyopathy with previously reduced ejection fraction Non-ischemic cardiomyopathy with previously reduced ejection fraction (EF 20-25%). Previous left heart catheterization in 2018 showed no significant coronary artery disease. Repeat echocardiogram in 2024 showed normal left ventricular function.  Hypertension Management includes consistent use of antihypertensive medication. Previous inconsistent medication adherence led to elevated blood pressure readings. - Ensure consistent use of antihypertensive medication.       Informed Consent   Shared Decision Making/Informed Consent The risks [chest pain, shortness of  breath, cardiac arrhythmias, dizziness, blood pressure fluctuations, myocardial infarction, stroke/transient ischemic attack, nausea, vomiting, allergic reaction, radiation exposure, metallic taste sensation and  life-threatening complications (estimated to be 1 in 10,000)], benefits (risk stratification, diagnosing coronary artery disease, treatment guidance) and alternatives of a cardiac PET stress test were discussed in detail with Ms. Heitman and she agrees to proceed.     Dispo: 1 yr APP  Signed, Oneil Parchment, MD

## 2024-04-27 DIAGNOSIS — K5909 Other constipation: Secondary | ICD-10-CM | POA: Diagnosis not present

## 2024-04-29 ENCOUNTER — Other Ambulatory Visit: Payer: Self-pay | Admitting: Nurse Practitioner

## 2024-04-29 DIAGNOSIS — M4316 Spondylolisthesis, lumbar region: Secondary | ICD-10-CM | POA: Diagnosis not present

## 2024-04-29 DIAGNOSIS — M48061 Spinal stenosis, lumbar region without neurogenic claudication: Secondary | ICD-10-CM | POA: Diagnosis not present

## 2024-04-29 DIAGNOSIS — D1809 Hemangioma of other sites: Secondary | ICD-10-CM | POA: Diagnosis not present

## 2024-04-29 DIAGNOSIS — M47816 Spondylosis without myelopathy or radiculopathy, lumbar region: Secondary | ICD-10-CM | POA: Diagnosis not present

## 2024-05-10 DIAGNOSIS — N3946 Mixed incontinence: Secondary | ICD-10-CM | POA: Diagnosis not present

## 2024-05-10 DIAGNOSIS — K5909 Other constipation: Secondary | ICD-10-CM | POA: Diagnosis not present

## 2024-05-11 DIAGNOSIS — N3010 Interstitial cystitis (chronic) without hematuria: Secondary | ICD-10-CM | POA: Diagnosis not present

## 2024-05-15 ENCOUNTER — Encounter (HOSPITAL_COMMUNITY): Payer: Self-pay

## 2024-05-18 ENCOUNTER — Ambulatory Visit: Payer: Self-pay | Admitting: Cardiology

## 2024-05-18 ENCOUNTER — Encounter (HOSPITAL_COMMUNITY)
Admission: RE | Admit: 2024-05-18 | Discharge: 2024-05-18 | Disposition: A | Discharge status: Home / Self Care | Source: Ambulatory Visit | Attending: Cardiology | Admitting: Cardiology

## 2024-05-18 DIAGNOSIS — Z0189 Encounter for other specified special examinations: Secondary | ICD-10-CM | POA: Diagnosis not present

## 2024-05-18 DIAGNOSIS — I209 Angina pectoris, unspecified: Secondary | ICD-10-CM | POA: Diagnosis not present

## 2024-05-18 LAB — NM PET CT CARDIAC PERFUSION MULTI W/ABSOLUTE BLOODFLOW
LV dias vol: 77 mL (ref 46–106)
LV sys vol: 33 mL (ref 3.8–5.2)
MBFR: 2.86
Nuc Rest EF: 57 %
Nuc Stress EF: 67 %
Rest MBF: 0.9 ml/g/min
Rest Nuclear Isotope Dose: 15.1 mCi
ST Depression (mm): 0 mm
Stress MBF: 2.57 ml/g/min
Stress Nuclear Isotope Dose: 14.7 mCi

## 2024-05-18 MED ORDER — REGADENOSON 0.4 MG/5ML IV SOLN
INTRAVENOUS | Status: AC
  Filled 2024-05-18: qty 5

## 2024-05-18 MED ORDER — REGADENOSON 0.4 MG/5ML IV SOLN: 0.4000 mg | Freq: Once | INTRAVENOUS | Status: AC

## 2024-05-18 MED ORDER — RUBIDIUM RB82 GENERATOR (RUBYFILL)
15.0000 | PACK | Freq: Once | INTRAVENOUS | Status: AC
  Administered 2024-05-18: 15.11 via INTRAVENOUS

## 2024-05-18 MED ORDER — RUBIDIUM RB82 GENERATOR (RUBYFILL)
15.0000 | PACK | Freq: Once | INTRAVENOUS | Status: AC
  Administered 2024-05-18: 14.68 via INTRAVENOUS

## 2024-05-18 NOTE — Progress Notes (Signed)
 Pt. Tolerated lexi scan well.

## 2024-05-27 DIAGNOSIS — F411 Generalized anxiety disorder: Secondary | ICD-10-CM | POA: Diagnosis not present

## 2024-05-27 DIAGNOSIS — I10 Essential (primary) hypertension: Secondary | ICD-10-CM | POA: Diagnosis not present

## 2024-05-27 DIAGNOSIS — M797 Fibromyalgia: Secondary | ICD-10-CM | POA: Diagnosis not present

## 2024-05-27 DIAGNOSIS — E785 Hyperlipidemia, unspecified: Secondary | ICD-10-CM | POA: Diagnosis not present

## 2024-05-27 DIAGNOSIS — M8580 Other specified disorders of bone density and structure, unspecified site: Secondary | ICD-10-CM | POA: Diagnosis not present

## 2024-05-27 DIAGNOSIS — Z0000 Encounter for general adult medical examination without abnormal findings: Secondary | ICD-10-CM | POA: Diagnosis not present

## 2024-06-11 ENCOUNTER — Other Ambulatory Visit: Payer: Self-pay | Admitting: Psychiatry

## 2024-06-11 DIAGNOSIS — F5101 Primary insomnia: Secondary | ICD-10-CM

## 2024-06-15 ENCOUNTER — Ambulatory Visit: Admitting: Cardiology

## 2024-07-07 ENCOUNTER — Other Ambulatory Visit: Payer: Self-pay | Admitting: Psychiatry

## 2024-07-07 DIAGNOSIS — F5101 Primary insomnia: Secondary | ICD-10-CM

## 2024-07-07 NOTE — Telephone Encounter (Signed)
 Start Lunesta 3 mg tablet 1 right before sleep If it works then reduce quetiapine  to 1/2 tablet at night for 2 weeks,  Then reduce quetiapine  to 1/4 tablet at night for 2 weeks then stop it.

## 2025-04-11 ENCOUNTER — Ambulatory Visit: Admitting: Psychiatry
